# Patient Record
Sex: Female | Born: 1950 | Race: Black or African American | Hispanic: No | Smoking: Never smoker
Health system: Southern US, Community
[De-identification: ages and names within clinical notes are randomized; demographics above are authoritative.]

## PROBLEM LIST (undated history)

## (undated) DIAGNOSIS — D649 Anemia, unspecified: Secondary | ICD-10-CM

## (undated) DIAGNOSIS — H409 Unspecified glaucoma: Secondary | ICD-10-CM

## (undated) DIAGNOSIS — N189 Chronic kidney disease, unspecified: Secondary | ICD-10-CM

## (undated) DIAGNOSIS — E119 Type 2 diabetes mellitus without complications: Secondary | ICD-10-CM

## (undated) DIAGNOSIS — I10 Essential (primary) hypertension: Secondary | ICD-10-CM

## (undated) DIAGNOSIS — U071 COVID-19: Secondary | ICD-10-CM

## (undated) HISTORY — DX: Chronic kidney disease, unspecified: N18.9

## (undated) HISTORY — DX: Type 2 diabetes mellitus without complications: E11.9

## (undated) HISTORY — DX: Unspecified glaucoma: H40.9

## (undated) HISTORY — DX: Anemia, unspecified: D64.9

## (undated) HISTORY — PX: TUBAL LIGATION: SHX77

## (undated) HISTORY — DX: COVID-19: U07.1

## (undated) HISTORY — DX: Essential (primary) hypertension: I10

## (undated) HISTORY — PX: BIOPSY THYROID: PRO38

---

## 2004-06-29 ENCOUNTER — Ambulatory Visit: Payer: Self-pay

## 2005-09-26 ENCOUNTER — Ambulatory Visit: Payer: Self-pay

## 2006-10-14 ENCOUNTER — Ambulatory Visit: Payer: Self-pay

## 2007-12-17 ENCOUNTER — Ambulatory Visit: Payer: Self-pay

## 2009-01-06 ENCOUNTER — Ambulatory Visit: Payer: Self-pay

## 2010-01-12 ENCOUNTER — Ambulatory Visit: Payer: Self-pay

## 2011-09-20 ENCOUNTER — Ambulatory Visit: Payer: Self-pay

## 2011-09-30 ENCOUNTER — Ambulatory Visit: Payer: Self-pay | Admitting: Gastroenterology

## 2011-09-30 LAB — HM COLONOSCOPY: HM Colonoscopy: NORMAL

## 2012-12-23 ENCOUNTER — Ambulatory Visit: Payer: Self-pay

## 2013-09-10 ENCOUNTER — Ambulatory Visit: Payer: Self-pay | Admitting: Family Medicine

## 2013-11-09 LAB — BASIC METABOLIC PANEL
BUN: 21 mg/dL (ref 4–21)
Creatinine: 0.8 mg/dL (ref 0.5–1.1)
Glucose: 96 mg/dL
POTASSIUM: 4.4 mmol/L (ref 3.4–5.3)
SODIUM: 141 mmol/L (ref 137–147)

## 2013-11-09 LAB — HEPATIC FUNCTION PANEL
ALT: 8 U/L (ref 7–35)
AST: 15 U/L (ref 13–35)

## 2013-12-24 ENCOUNTER — Ambulatory Visit: Payer: Self-pay

## 2014-07-13 LAB — HM DIABETES EYE EXAM

## 2014-11-14 LAB — HEMOGLOBIN A1C: Hgb A1c MFr Bld: 6.5 % — AB (ref 4.0–6.0)

## 2014-11-22 DIAGNOSIS — I10 Essential (primary) hypertension: Secondary | ICD-10-CM | POA: Insufficient documentation

## 2014-11-22 DIAGNOSIS — E118 Type 2 diabetes mellitus with unspecified complications: Secondary | ICD-10-CM | POA: Insufficient documentation

## 2014-11-22 DIAGNOSIS — E119 Type 2 diabetes mellitus without complications: Secondary | ICD-10-CM | POA: Insufficient documentation

## 2015-01-11 DIAGNOSIS — M7030 Other bursitis of elbow, unspecified elbow: Secondary | ICD-10-CM | POA: Insufficient documentation

## 2015-01-11 DIAGNOSIS — K5900 Constipation, unspecified: Secondary | ICD-10-CM | POA: Insufficient documentation

## 2015-01-11 DIAGNOSIS — M545 Low back pain, unspecified: Secondary | ICD-10-CM | POA: Insufficient documentation

## 2015-01-11 DIAGNOSIS — R202 Paresthesia of skin: Secondary | ICD-10-CM | POA: Insufficient documentation

## 2015-01-11 DIAGNOSIS — R634 Abnormal weight loss: Secondary | ICD-10-CM | POA: Insufficient documentation

## 2015-01-11 DIAGNOSIS — M5430 Sciatica, unspecified side: Secondary | ICD-10-CM | POA: Insufficient documentation

## 2015-01-11 DIAGNOSIS — I340 Nonrheumatic mitral (valve) insufficiency: Secondary | ICD-10-CM | POA: Insufficient documentation

## 2015-01-11 DIAGNOSIS — R87619 Unspecified abnormal cytological findings in specimens from cervix uteri: Secondary | ICD-10-CM | POA: Insufficient documentation

## 2015-01-11 DIAGNOSIS — R011 Cardiac murmur, unspecified: Secondary | ICD-10-CM

## 2015-01-11 DIAGNOSIS — D649 Anemia, unspecified: Secondary | ICD-10-CM | POA: Insufficient documentation

## 2015-01-11 DIAGNOSIS — E559 Vitamin D deficiency, unspecified: Secondary | ICD-10-CM | POA: Insufficient documentation

## 2015-01-11 DIAGNOSIS — E669 Obesity, unspecified: Secondary | ICD-10-CM | POA: Insufficient documentation

## 2015-01-13 ENCOUNTER — Ambulatory Visit (INDEPENDENT_AMBULATORY_CARE_PROVIDER_SITE_OTHER): Payer: 59 | Admitting: Family Medicine

## 2015-01-13 ENCOUNTER — Encounter: Payer: Self-pay | Admitting: Family Medicine

## 2015-01-13 VITALS — BP 132/84 | HR 84 | Temp 98.2°F | Resp 16 | Wt 193.0 lb

## 2015-01-13 DIAGNOSIS — M722 Plantar fascial fibromatosis: Secondary | ICD-10-CM | POA: Diagnosis not present

## 2015-01-13 DIAGNOSIS — H6983 Other specified disorders of Eustachian tube, bilateral: Secondary | ICD-10-CM

## 2015-01-13 MED ORDER — FLUTICASONE PROPIONATE 50 MCG/ACT NA SUSP: 2.0000 | Freq: Every day | NASAL | Status: DC

## 2015-01-13 NOTE — Patient Instructions (Signed)
Plantar Fasciitis Plantar fasciitis is a common condition that causes foot pain. It is soreness (inflammation) of the band of tough fibrous tissue on the bottom of the foot that runs from the heel bone (calcaneus) to the ball of the foot. The cause of this soreness may be from excessive standing, poor fitting shoes, running on hard surfaces, being overweight, having an abnormal walk, or overuse (this is common in runners) of the painful foot or feet. It is also common in aerobic exercise dancers and ballet dancers. SYMPTOMS  Most people with plantar fasciitis complain of:  Severe pain in the morning on the bottom of their foot especially when taking the first steps out of bed. This pain recedes after a few minutes of walking.  Severe pain is experienced also during walking following a long period of inactivity.  Pain is worse when walking barefoot or up stairs DIAGNOSIS   Your caregiver will diagnose this condition by examining and feeling your foot.  Special tests such as X-rays of your foot, are usually not needed. PREVENTION   Consult a sports medicine professional before beginning a new exercise program.  Walking programs offer a good workout. With walking there is a lower chance of overuse injuries common to runners. There is less impact and less jarring of the joints.  Begin all new exercise programs slowly. If problems or pain develop, decrease the amount of time or distance until you are at a comfortable level.  Wear good shoes and replace them regularly.  Stretch your foot and the heel cords at the back of the ankle (Achilles tendon) both before and after exercise.  Run or exercise on even surfaces that are not hard. For example, asphalt is better than pavement.  Do not run barefoot on hard surfaces.  If using a treadmill, vary the incline.  Do not continue to workout if you have foot or joint problems. Seek professional help if they do not improve. HOME CARE INSTRUCTIONS     Avoid activities that cause you pain until you recover.  Use ice or cold packs on the problem or painful areas after working out. Use frozen water bottle.   Only take over-the-counter or prescription medicines for pain, discomfort, or fever as directed by your caregiver.  Soft shoe inserts or athletic shoes with air or gel sole cushions may be helpful.  If problems continue or become more severe, consult a sports medicine caregiver or your own health care provider. Cortisone is a potent anti-inflammatory medication that may be injected into the painful area. You can discuss this treatment with your caregiver. MAKE SURE YOU:   Understand these instructions.  Will watch your condition.  Will get help right away if you are not doing well or get worse. Document Released: 03/19/2001 Document Revised: 09/16/2011 Document Reviewed: 05/18/2008 [employer] Patient Information 2015 [place], [place]. This information is not intended to replace advice given to you by your health care provider. Make sure you discuss any questions you have with your health care provider.

## 2015-01-13 NOTE — Progress Notes (Signed)
Subjective:    Patient ID: Natasha Phillips, female    DOB: [birth year], 64 y.o.   MRN: [id number]  Otalgia  There is pain in the left ear. This is a recurrent problem. The current episode started more than 1 year ago. The problem occurs every few hours (Pt reports the pain comes and goes, but hurts everyday.  ). The problem has been gradually worsening. There has been no fever. Associated symptoms include headaches and hearing loss. Pertinent negatives include no coughing, ear discharge, rhinorrhea or sore throat.  Foot Injury  There was no injury mechanism (Pt reports having heel pain several years ago, but it did resolve until around June.  ). The pain is present in the right heel. The quality of the pain is described as aching. The pain has been fluctuating since onset. Exacerbated by: Worse in the mornings, and also when she sits for a period of time. She has tried nothing for the symptoms.    Patient Active Problem List   Diagnosis Date Noted  . Abnormal cervical Papanicolaou smear 01/11/2015  . Abnormal loss of weight 01/11/2015  . Absolute anemia 01/11/2015  . Cardiac murmur 01/11/2015  . CN (constipation) 01/11/2015  . Diabetes 01/11/2015  . Calcium blood increased 01/11/2015  . BP (high blood pressure) 01/11/2015  . LBP (low back pain) 01/11/2015  . Adiposity 01/11/2015  . Bursitis of elbow 01/11/2015  . Leg paresthesia 01/11/2015  . Neuralgia neuritis, sciatic nerve 01/11/2015  . Avitaminosis D 01/11/2015  . Essential (primary) hypertension 11/22/2014   Family History  Problem Relation Age of Onset  . Diabetes Mother   . Heart failure Mother   . Diabetes Sister   . Healthy Sister   . Healthy Sister   . Healthy Sister   . Diabetes Brother   . Diabetes Sister   . Heart failure Father   . Brain cancer Brother   . Throat cancer Brother   . Glaucoma Other     Runs on Paternal Side   History   Social History  . Marital Status: Widowed    Spouse Name: N/A  .  Number of Children: 2  . Years of Education: College   Occupational History  . Retired    Social History Main Topics  . Smoking status: Never Smoker   . Smokeless tobacco: Never Used  . Alcohol Use: No  . Drug Use: No  . Sexual Activity: Not on file   Other Topics Concern  . Not on file   Social History Narrative   Past Surgical History  Procedure Laterality Date  . Tubal ligation    . Cesarean section      1981 1988   Allergies  Allergen Reactions  . Sulfa Antibiotics    Previous Medications   FOSINOPRIL (MONOPRIL) 40 MG TABLET    Take by mouth.   GLIPIZIDE (GLUCOTROL XL) 5 MG 24 HR TABLET    Take by mouth.   HYDROCHLOROTHIAZIDE (HYDRODIURIL) 25 MG TABLET    Take by mouth.   MELOXICAM (MOBIC) 15 MG TABLET    Take by mouth.   METFORMIN (GLUCOPHAGE) 1000 MG TABLET    Take by mouth.   MULTIPLE VITAMINS-CALCIUM PO    Take by mouth.   PIOGLITAZONE (ACTOS) 45 MG TABLET    Take by mouth.   SAXAGLIPTIN HCL (ONGLYZA) 5 MG TABS TABLET    Take by mouth.   BP 132/84 mmHg  Pulse 84  Temp(Src) 98.2 F (36.8 C) (Oral)  Resp  16  Wt 193 lb (87.544 kg)    Review of Systems  Constitutional: Negative for fever, chills, diaphoresis, activity change, appetite change, fatigue and unexpected weight change.  HENT: Positive for ear pain and hearing loss. Negative for congestion, ear discharge, facial swelling, mouth sores, nosebleeds, postnasal drip, rhinorrhea, sinus pressure, sneezing, sore throat, tinnitus, trouble swallowing and voice change.   Respiratory: Negative for apnea, cough, choking, chest tightness, shortness of breath, wheezing and stridor.   Cardiovascular: Negative for chest pain, palpitations and leg swelling.  Neurological: Positive for headaches. Negative for dizziness and light-headedness.       Objective:   Physical Exam  Constitutional: She is oriented to person, place, and time. She appears well-developed and well-nourished.  HENT:  Head: Normocephalic and  atraumatic.  Right Ear: Hearing and tympanic membrane normal.  Left Ear: Hearing and tympanic membrane normal.  Nose: Mucosal edema present. No rhinorrhea or sinus tenderness.  Mouth/Throat: Uvula is midline, oropharynx is clear and moist and mucous membranes are normal.  Eyes: Conjunctivae and EOM are normal. Pupils are equal, round, and reactive to light.  Neck: Normal range of motion. Neck supple.  Cardiovascular: Normal rate and regular rhythm.   Murmur heard. Pulmonary/Chest: Effort normal and breath sounds normal.  Musculoskeletal: She exhibits no edema.       Feet:  Neurological: She is alert and oriented to person, place, and time.  Psychiatric: She has a normal mood and affect. Her behavior is normal. Judgment and thought content normal.  BP 132/84 mmHg  Pulse 84  Temp(Src) 98.2 F (36.8 C) (Oral)  Resp 16  Wt 193 lb (87.544 kg)      Assessment & Plan:  1. Plantar fasciitis of right foot Instructions given. Going to try icing with water bottle and stretching. OV if does not improve.   2. Eustachian tube dysfunction, bilateral New problem Worsening. Will try nasal spray.  Follow up if does not resolve.  - fluticasone (FLONASE) 50 MCG/ACT nasal spray; Place 2 sprays into both nostrils daily.  Dispense: 16 g; Refill: 6  Lorie PhenixNancy Chattie Greeson, MD

## 2015-02-07 ENCOUNTER — Telehealth: Payer: Self-pay | Admitting: Family Medicine

## 2015-02-07 NOTE — Telephone Encounter (Signed)
Left message to call back  

## 2015-02-07 NOTE — Telephone Encounter (Signed)
Should be ok. Thanks!

## 2015-02-07 NOTE — Telephone Encounter (Signed)
Pt is asking if it would be ok if she takes Biotin   Over the counter?  [phone]

## 2015-02-08 NOTE — Telephone Encounter (Signed)
Advised patient as below.  

## 2015-03-14 ENCOUNTER — Encounter: Payer: Self-pay | Admitting: Family Medicine

## 2015-03-14 ENCOUNTER — Ambulatory Visit (INDEPENDENT_AMBULATORY_CARE_PROVIDER_SITE_OTHER): Payer: 59 | Admitting: Family Medicine

## 2015-03-14 VITALS — BP 136/84 | HR 84 | Temp 98.5°F | Resp 16 | Ht 61.5 in | Wt 191.0 lb

## 2015-03-14 DIAGNOSIS — I10 Essential (primary) hypertension: Secondary | ICD-10-CM | POA: Diagnosis not present

## 2015-03-14 DIAGNOSIS — E119 Type 2 diabetes mellitus without complications: Secondary | ICD-10-CM | POA: Diagnosis not present

## 2015-03-14 LAB — POCT GLYCOSYLATED HEMOGLOBIN (HGB A1C): Hemoglobin A1C: 5.9

## 2015-03-14 NOTE — Progress Notes (Addendum)
Patient ID: Natasha Phillips, female   DOB: January 08, 1951, 64 y.o.   MRN: [id number]        Patient: Natasha Phillips Female    DOB: 1951/02/24   64 y.o.   MRN: [id number] Visit Date: 03/14/2015  Today's Provider: Lorie Phenix, MD   Chief Complaint  Patient presents with  . Diabetes  . Hypertension   Subjective:    HPI   Diabetes Mellitus Type II, Follow-up:   Lab Results  Component Value Date   HGBA1C 6.5* 11/14/2014   Results for orders placed or performed in visit on 03/14/15  POCT glycosylated hemoglobin (Hb A1C)  Result Value Ref Range   Hemoglobin A1C 5.9     Last seen for diabetes 4 months ago.  Management changes included no changes. She reports excellent compliance with treatment. She is not having side effects.  Current symptoms include none and have been stable. Home blood sugar records: fasting range: 130-135  Episodes of hypoglycemia? No lows. Knows what a low is.    Current Insulin Regimen: none Most Recent Eye Exam: 07/[facility] Weight trend: stable Prior visit with dietician: no Current diet: in general, a "healthy" diet   Current exercise: walking at least 5 times a week.   Pertinent Labs:    Component Value Date/Time   CREATININE 0.8 11/09/2013    Wt Readings from Last 3 Encounters:  03/14/15 191 lb (86.637 kg)  01/13/15 193 lb (87.544 kg)  11/14/14 193 lb (87.544 kg)     Hypertension, follow-up:  BP Readings from Last 3 Encounters:  03/14/15 136/84  01/13/15 132/84  11/14/14 122/88    She was last seen for hypertension 4 months ago.  BP at that visit was 132/84. Management changes since that visit include none. She reports excellent compliance with treatment. She is not having side effects.  She is exercising. She is adherent to low salt diet.   Outside blood pressures are not being checked. She is experiencing none.  Patient denies chest pain.   Cardiovascular risk factors include diabetes mellitus.       Allergies  Allergen Reactions  . Sulfa Antibiotics    Previous Medications   FLUTICASONE (FLONASE) 50 MCG/ACT NASAL SPRAY    Place 2 sprays into both nostrils daily.   FOSINOPRIL (MONOPRIL) 40 MG TABLET    Take 40 mg by mouth daily.    GLIPIZIDE (GLUCOTROL XL) 5 MG 24 HR TABLET    Take 5 mg by mouth daily with breakfast.    HYDROCHLOROTHIAZIDE (HYDRODIURIL) 25 MG TABLET    Take 25 mg by mouth daily.    MELOXICAM (MOBIC) 15 MG TABLET    Take 15 mg by mouth daily.    METFORMIN (GLUCOPHAGE) 1000 MG TABLET    Take 1,000 mg by mouth daily with breakfast.    MULTIPLE VITAMINS-CALCIUM PO    Take 1 tablet by mouth daily.    PIOGLITAZONE (ACTOS) 45 MG TABLET    Take 45 mg by mouth daily.    SAXAGLIPTIN HCL (ONGLYZA) 5 MG TABS TABLET    Take 5 mg by mouth daily.     Review of Systems  Constitutional: Negative.   Eyes: Negative.   Respiratory: Negative.   Cardiovascular: Negative.   Gastrointestinal: Negative.   Endocrine: Negative.   Skin: Negative.   Neurological: Negative.   Psychiatric/Behavioral: Negative.     Social History  Substance Use Topics  . Smoking status: Never Smoker   . Smokeless tobacco: Never Used  .  Alcohol Use: No   Objective:   BP 136/84 mmHg  Pulse 84  Temp(Src) 98.5 F (36.9 C) (Oral)  Resp 16  Ht 5' 1.5" (1.562 m)  Wt 191 lb (86.637 kg)  BMI 35.51 kg/m2  SpO2 100%  Physical Exam  Constitutional: She is oriented to person, place, and time. She appears well-developed and well-nourished.  Cardiovascular: Normal rate and regular rhythm.   Pulmonary/Chest: Effort normal and breath sounds normal.  Neurological: She is alert and oriented to person, place, and time.  Psychiatric: She has a normal mood and affect. Her behavior is normal. Judgment and thought content normal.   Results for orders placed or performed in visit on 03/14/15  POCT glycosylated hemoglobin (Hb A1C)  Result Value Ref Range   Hemoglobin A1C 5.9   HM DIABETES EYE EXAM  Result  Value Ref Range   HM Diabetic Eye Exam No Retinopathy No Retinopathy   Diabetic Foot Exam - Simple   Simple Foot Form  Diabetic Foot exam was performed with the following findings:  Yes 03/14/2015 12:51 PM  Visual Inspection  No deformities, no ulcerations, no other skin breakdown bilaterally:  Yes  Sensation Testing  Intact to touch and monofilament testing bilaterally:  Yes  Pulse Check  Posterior Tibialis and Dorsalis pulse intact bilaterally:  Yes  Comments          Assessment & Plan:     1. Type 2 diabetes mellitus without complication Improved. Doing very well.  Will stop Glucotrol for now and recheck in 3 months.    2. Essential (primary) hypertension Stable. Continue current medication and plan of care.      Patient was seen and examined by Leo Grosser, MD, and note scribed, in part,  by Rondel Baton, CMA.   I have reviewed the document for accuracy and completeness and I agree with above. - Leo Grosser, MD   Lorie Phenix, MD  [facility]

## 2015-05-17 ENCOUNTER — Other Ambulatory Visit: Payer: Self-pay | Admitting: Family Medicine

## 2015-05-17 DIAGNOSIS — E119 Type 2 diabetes mellitus without complications: Secondary | ICD-10-CM

## 2015-06-22 ENCOUNTER — Encounter: Payer: Self-pay | Admitting: Family Medicine

## 2015-06-22 ENCOUNTER — Ambulatory Visit (INDEPENDENT_AMBULATORY_CARE_PROVIDER_SITE_OTHER): Payer: 59 | Admitting: Family Medicine

## 2015-06-22 VITALS — BP 136/72 | HR 84 | Temp 98.2°F | Resp 16 | Ht 60.5 in | Wt 187.0 lb

## 2015-06-22 DIAGNOSIS — I10 Essential (primary) hypertension: Secondary | ICD-10-CM | POA: Diagnosis not present

## 2015-06-22 DIAGNOSIS — Z126 Encounter for screening for malignant neoplasm of bladder: Secondary | ICD-10-CM

## 2015-06-22 DIAGNOSIS — E119 Type 2 diabetes mellitus without complications: Secondary | ICD-10-CM | POA: Diagnosis not present

## 2015-06-22 DIAGNOSIS — Z0000 Encounter for general adult medical examination without abnormal findings: Secondary | ICD-10-CM

## 2015-06-22 DIAGNOSIS — Z136 Encounter for screening for cardiovascular disorders: Secondary | ICD-10-CM | POA: Diagnosis not present

## 2015-06-22 DIAGNOSIS — Z13220 Encounter for screening for lipoid disorders: Secondary | ICD-10-CM

## 2015-06-22 LAB — POCT GLYCOSYLATED HEMOGLOBIN (HGB A1C)
Est. average glucose Bld gHb Est-mCnc: 137
Hemoglobin A1C: 6.4

## 2015-06-22 LAB — POCT UA - MICROALBUMIN: Microalbumin Ur, POC: NEGATIVE mg/L

## 2015-06-22 LAB — POCT URINALYSIS DIPSTICK
BILIRUBIN UA: NEGATIVE
Blood, UA: NEGATIVE
Glucose, UA: NEGATIVE
Ketones, UA: NEGATIVE
Leukocytes, UA: NEGATIVE
NITRITE UA: NEGATIVE
PH UA: 6
Protein, UA: NEGATIVE
Spec Grav, UA: 1.02
UROBILINOGEN UA: 0.2

## 2015-06-22 MED ORDER — MELOXICAM 15 MG PO TABS: 15.0000 mg | ORAL_TABLET | Freq: Every day | ORAL | Status: DC

## 2015-06-22 MED ORDER — METFORMIN HCL 1000 MG PO TABS: 1000.0000 mg | ORAL_TABLET | Freq: Every day | ORAL | Status: DC

## 2015-06-22 MED ORDER — HYDROCHLOROTHIAZIDE 25 MG PO TABS: 25.0000 mg | ORAL_TABLET | Freq: Every day | ORAL | Status: DC

## 2015-06-22 MED ORDER — PIOGLITAZONE HCL 45 MG PO TABS: 45.0000 mg | ORAL_TABLET | Freq: Every day | ORAL | Status: DC

## 2015-06-22 MED ORDER — FOSINOPRIL SODIUM 40 MG PO TABS: 40.0000 mg | ORAL_TABLET | Freq: Every day | ORAL | Status: DC

## 2015-06-22 MED ORDER — SAXAGLIPTIN HCL 5 MG PO TABS: 5.0000 mg | ORAL_TABLET | Freq: Every day | ORAL | Status: DC

## 2015-06-22 NOTE — Progress Notes (Signed)
Patient: Natasha Phillips, Female    DOB: 1950/10/20, 64 y.o.   MRN: [id number] Visit Date: 06/22/2015  Today's Provider: Lorie Phenix, MD   Chief Complaint  Patient presents with  . Annual Exam   Subjective:    Annual physical exam Natasha Phillips is a 64 y.o. female who presents today for health maintenance and complete physical. She feels well. She reports exercising by walking on treadmill 3-5 times per week. She reports she is sleeping well.  Pt sees GYN for Pap and Mammogram. Colonoscopy 2013 and was WNL. ----------------------------------------------------------------- Diabetes Mellitus: Patient presents for follow up of diabetes. Symptoms: none. Symptoms have been well-controlled. Patient denies foot ulcerations, hypoglycemia , increase appetite, nausea, paresthesia of the feet, polydipsia, polyuria, visual disturbances and vomitting.  Evaluation to date has been included: hemoglobin A1C and microalbuminuria.  Home sugars: patient does not check sugars. Treatment to date: Medications.   Review of Systems  Constitutional: Negative.   HENT: Positive for ear pain.   Eyes: Negative.   Respiratory: Negative.   Cardiovascular: Negative.   Gastrointestinal: Negative.   Endocrine: Negative.   Genitourinary: Negative.   Musculoskeletal: Positive for arthralgias.  Skin: Negative.   Allergic/Immunologic: Negative.   Neurological: Negative.   Hematological: Negative.   Psychiatric/Behavioral: Negative.   All other systems reviewed and are negative.   Social History      She  reports that she has never smoked. She has never used smokeless tobacco. She reports that she does not drink alcohol or use illicit drugs.       Social History   Social History  . Marital Status: Widowed    Spouse Name: N/A  . Number of Children: 2  . Years of Education: College   Occupational History  . Retired    Social History Main Topics  . Smoking status: Never Smoker   .  Smokeless tobacco: Never Used  . Alcohol Use: No  . Drug Use: No  . Sexual Activity: Not Asked   Other Topics Concern  . None   Social History Narrative    Patient Active Problem List   Diagnosis Date Noted  . Abnormal cervical Papanicolaou smear 01/11/2015  . Abnormal loss of weight 01/11/2015  . Absolute anemia 01/11/2015  . Cardiac murmur 01/11/2015  . CN (constipation) 01/11/2015  . Diabetes (HCC) 01/11/2015  . Calcium blood increased 01/11/2015  . LBP (low back pain) 01/11/2015  . Adiposity 01/11/2015  . Bursitis of elbow 01/11/2015  . Leg paresthesia 01/11/2015  . Neuralgia neuritis, sciatic nerve 01/11/2015  . Avitaminosis D 01/11/2015  . Essential (primary) hypertension 11/22/2014    Past Surgical History  Procedure Laterality Date  . Tubal ligation    . Cesarean section      1981 1988    Family History        Family Status  Relation Status Death Age  . Mother Deceased 28    chf  . Sister Alive   . Sister Alive   . Sister Alive   . Sister Alive   . Brother Alive   . Sister Alive   . Father Deceased 54    chf  . Brother Deceased 41    brain cancer  . Brother Deceased     throat cancer        Her family history includes Brain cancer in her brother; Diabetes in her brother, mother, sister, and sister; Glaucoma in her other; Healthy in her sister, sister, and sister;  Heart failure in her father and mother; Throat cancer in her brother.    Allergies  Allergen Reactions  . Sulfa Antibiotics     Previous Medications   FLUTICASONE (FLONASE) 50 MCG/ACT NASAL SPRAY    Place 2 sprays into both nostrils daily.   FOSINOPRIL (MONOPRIL) 40 MG TABLET    Take 40 mg by mouth daily.    HYDROCHLOROTHIAZIDE (HYDRODIURIL) 25 MG TABLET    Take 25 mg by mouth daily.    MELOXICAM (MOBIC) 15 MG TABLET    Take 15 mg by mouth daily.    METFORMIN (GLUCOPHAGE) 1000 MG TABLET    Take 1,000 mg by mouth daily with breakfast.    MULTIPLE VITAMINS-CALCIUM PO    Take 1 tablet  by mouth daily.    ONGLYZA 5 MG TABS TABLET    TAKE 1 TABLET BY MOUTH EVERY DAY   PIOGLITAZONE (ACTOS) 45 MG TABLET    Take 45 mg by mouth daily.     Patient Care Team: Lorie PhenixNancy Jilian West, MD as PCP - General (Family Medicine)     Objective:   Vitals: BP 136/72 mmHg  Pulse 84  Temp(Src) 98.2 F (36.8 C) (Oral)  Resp 16  Ht 5' 0.5" (1.537 m)  Wt 187 lb (84.823 kg)  BMI 35.91 kg/m2   Physical Exam  Constitutional: She is oriented to person, place, and time. She appears well-developed and well-nourished. No distress.  HENT:  Head: Normocephalic and atraumatic.  Right Ear: External ear normal.  Left Ear: External ear normal.  Nose: Nose normal.  Mouth/Throat: Oropharynx is clear and moist. No oropharyngeal exudate.  Eyes: Conjunctivae and EOM are normal. Pupils are equal, round, and reactive to light. Right eye exhibits no discharge. Left eye exhibits no discharge. No scleral icterus.  Neck: Normal range of motion. Neck supple. No JVD present. No tracheal deviation present. No thyromegaly present.  Cardiovascular: Normal rate, regular rhythm, normal heart sounds and intact distal pulses.  Exam reveals no gallop and no friction rub.   No murmur heard. Pulmonary/Chest: Effort normal and breath sounds normal. No stridor. No respiratory distress. She has no wheezes. She has no rales. She exhibits no tenderness.  Abdominal: Soft. Bowel sounds are normal. She exhibits no distension and no mass. There is no tenderness. There is no rebound and no guarding.  Musculoskeletal: Normal range of motion. She exhibits no edema or tenderness.  Lymphadenopathy:    She has no cervical adenopathy.  Neurological: She is alert and oriented to person, place, and time. She has normal reflexes. She displays normal reflexes. No cranial nerve deficit. She exhibits normal muscle tone. Coordination normal.  Skin: Skin is warm and dry. No rash noted. She is not diaphoretic. No erythema. No pallor.  Psychiatric: She  has a normal mood and affect. Her behavior is normal. Judgment and thought content normal.     Depression Screen PHQ 2/9 Scores 06/22/2015  PHQ - 2 Score 0      Assessment & Plan:     Routine Health Maintenance and Physical Exam  Exercise Activities and Dietary recommendations Goals    . HEMOGLOBIN A1C < 7.0    . Peak Blood Glucose < 180       Immunization History  Administered Date(s) Administered  . Hepatitis B 09/20/2011, 11/01/2011, 04/03/2012  . Pneumococcal Polysaccharide-23 05/19/2013  . Tdap 02/07/2012     Discussed health benefits of physical activity, and encouraged her to engage in regular exercise appropriate for her age and condition.    --------------------------------------------------------------------  1. Annual physical exam Stable. As above.   Results for orders placed or performed in visit on 06/22/15  POCT urinalysis dipstick  Result Value Ref Range   Color, UA amber    Clarity, UA clear    Glucose, UA Negative    Bilirubin, UA Negative    Ketones, UA Negative    Spec Grav, UA 1.020    Blood, UA Negative    pH, UA 6.0    Protein, UA Negative    Urobilinogen, UA 0.2    Nitrite, UA Negative    Leukocytes, UA Negative Negative  POCT UA - Microalbumin  Result Value Ref Range   Microalbumin Ur, POC Negative mg/L  POCT glycosylated hemoglobin (Hb A1C)  Result Value Ref Range   Hemoglobin A1C 6.4    Est. average glucose Bld gHb Est-mCnc 137      2. Bladder cancer screening UA negative - POCT urinalysis dipstick  3. Type 2 diabetes mellitus without complication, without long-term current use of insulin (HCC) Stable. A1C 6.4 today. Continue current medications and plan of care. - meloxicam (MOBIC) 15 MG tablet; Take 1 tablet (15 mg total) by mouth daily.  Dispense: 90 tablet; Refill: 3 - metFORMIN (GLUCOPHAGE) 1000 MG tablet; Take 1 tablet (1,000 mg total) by mouth daily with breakfast.  Dispense: 90 tablet; Refill: 3 - saxagliptin HCl  (ONGLYZA) 5 MG TABS tablet; Take 1 tablet (5 mg total) by mouth daily.  Dispense: 90 tablet; Refill: 3 - pioglitazone (ACTOS) 45 MG tablet; Take 1 tablet (45 mg total) by mouth daily.  Dispense: 90 tablet; Refill: 3 - POCT UA - Microalbumin - POCT glycosylated hemoglobin (Hb A1C)  4. Essential (primary) hypertension Stable. Refill medications as below. FU pending lab results. - Comprehensive metabolic panel - CBC with Differential/Platelet - fosinopril (MONOPRIL) 40 MG tablet; Take 1 tablet (40 mg total) by mouth daily.  Dispense: 90 tablet; Refill: 3 - hydrochlorothiazide (HYDRODIURIL) 25 MG tablet; Take 1 tablet (25 mg total) by mouth daily.  Dispense: 90 tablet; Refill: 3  5. Encounter for lipid screening for cardiovascular disease FU pending lab results. - Lipid panel  Patient seen and examined by Leo Grosser, MD, and note scribed by Allene Dillon, CMA.  I have reviewed the document for accuracy and completeness and I agree with above. Leo Grosser, MD  Lorie Phenix, MD

## 2015-07-21 LAB — CBC WITH DIFFERENTIAL/PLATELET
BASOS: 0 %
Basophils Absolute: 0 10*3/uL (ref 0.0–0.2)
EOS (ABSOLUTE): 0.2 10*3/uL (ref 0.0–0.4)
EOS: 3 %
HEMATOCRIT: 33.7 % — AB (ref 34.0–46.6)
Hemoglobin: 11.1 g/dL (ref 11.1–15.9)
IMMATURE GRANULOCYTES: 0 %
Immature Grans (Abs): 0 10*3/uL (ref 0.0–0.1)
LYMPHS: 34 %
Lymphocytes Absolute: 2.4 10*3/uL (ref 0.7–3.1)
MCH: 29.2 pg (ref 26.6–33.0)
MCHC: 32.9 g/dL (ref 31.5–35.7)
MCV: 89 fL (ref 79–97)
Monocytes Absolute: 0.3 10*3/uL (ref 0.1–0.9)
Monocytes: 4 %
NEUTROS PCT: 59 %
Neutrophils Absolute: 4.1 10*3/uL (ref 1.4–7.0)
PLATELETS: 303 10*3/uL (ref 150–379)
RBC: 3.8 x10E6/uL (ref 3.77–5.28)
RDW: 13.8 % (ref 12.3–15.4)
WBC: 7 10*3/uL (ref 3.4–10.8)

## 2015-07-21 LAB — LIPID PANEL
CHOL/HDL RATIO: 2.4 ratio (ref 0.0–4.4)
CHOLESTEROL TOTAL: 118 mg/dL (ref 100–199)
HDL: 50 mg/dL (ref >39)
LDL CALC: 59 mg/dL (ref 0–99)
Triglycerides: 44 mg/dL (ref 0–149)
VLDL Cholesterol Cal: 9 mg/dL (ref 5–40)

## 2015-07-21 LAB — COMPREHENSIVE METABOLIC PANEL
ALK PHOS: 89 IU/L (ref 39–117)
ALT: 11 IU/L (ref 0–32)
AST: 16 IU/L (ref 0–40)
Albumin/Globulin Ratio: 1.4 (ref 1.1–2.5)
Albumin: 4.2 g/dL (ref 3.6–4.8)
BUN/Creatinine Ratio: 26 (ref 11–26)
BUN: 21 mg/dL (ref 8–27)
Bilirubin Total: 0.2 mg/dL (ref 0.0–1.2)
CO2: 26 mmol/L (ref 18–29)
CREATININE: 0.82 mg/dL (ref 0.57–1.00)
Calcium: 9.8 mg/dL (ref 8.7–10.3)
Chloride: 98 mmol/L (ref 96–106)
GFR calc Af Amer: 89 mL/min/{1.73_m2} (ref >59)
GFR calc non Af Amer: 77 mL/min/{1.73_m2} (ref >59)
Globulin, Total: 3.1 g/dL (ref 1.5–4.5)
Glucose: 128 mg/dL — ABNORMAL HIGH (ref 65–99)
Potassium: 4.2 mmol/L (ref 3.5–5.2)
Sodium: 142 mmol/L (ref 134–144)
Total Protein: 7.3 g/dL (ref 6.0–8.5)

## 2015-08-25 ENCOUNTER — Telehealth: Payer: Self-pay | Admitting: Family Medicine

## 2015-08-25 DIAGNOSIS — G8929 Other chronic pain: Secondary | ICD-10-CM

## 2015-08-25 DIAGNOSIS — H9209 Otalgia, unspecified ear: Principal | ICD-10-CM

## 2015-08-25 NOTE — Telephone Encounter (Signed)
Ok to refer patient? Please advise. Thanks!  

## 2015-08-25 NOTE — Telephone Encounter (Signed)
OK to refer. Thanks.

## 2015-08-25 NOTE — Telephone Encounter (Signed)
Pt wants you to refer her to an ENT for chronic ear pain.    Her call back is [phone]  She has [employer].  Thanks Barth Kirks

## 2015-08-30 ENCOUNTER — Ambulatory Visit (INDEPENDENT_AMBULATORY_CARE_PROVIDER_SITE_OTHER): Payer: 59 | Admitting: Family Medicine

## 2015-08-30 ENCOUNTER — Encounter: Payer: Self-pay | Admitting: Family Medicine

## 2015-08-30 VITALS — BP 108/72 | HR 100 | Temp 98.2°F | Resp 16 | Wt 191.0 lb

## 2015-08-30 DIAGNOSIS — R51 Headache: Secondary | ICD-10-CM | POA: Diagnosis not present

## 2015-08-30 DIAGNOSIS — H6983 Other specified disorders of Eustachian tube, bilateral: Secondary | ICD-10-CM | POA: Diagnosis not present

## 2015-08-30 DIAGNOSIS — H9202 Otalgia, left ear: Secondary | ICD-10-CM | POA: Diagnosis not present

## 2015-08-30 DIAGNOSIS — R519 Headache, unspecified: Secondary | ICD-10-CM

## 2015-08-30 NOTE — Progress Notes (Signed)
   Subjective:    Patient ID: Natasha Phillips, female    DOB: [birth year], 65 y.o.   MRN: [id number]  HPI Recurrent Ear Pain: Patient presents with left ear pain. Symptoms began 2 weeks ago and are gradually worsening since that time. Patient denies fever and productive cough. Patient reports that she has been using Flonase 2 sprays, reports she has used it about 9 days in the last 2 weeks. Patient was seen in 01/13/2015 and was started on Flonase pt reports that spray helped in the first few weeks, but pain has not fully resolved.    Review of Systems  Constitutional: Negative.   HENT: Positive for congestion and ear pain. Negative for postnasal drip, rhinorrhea, sinus pressure, sneezing, sore throat and tinnitus.   Respiratory: Negative for cough, shortness of breath and wheezing.       Blood pressure 108/72, pulse 100, temperature 98.2 F (36.8 C), resp. rate 16, weight 191 lb (86.637 kg), SpO2 98 %. Objective:   Physical Exam  Constitutional: She appears well-developed and well-nourished.  HENT:  Head: Normocephalic and atraumatic.  Right Ear: External ear normal.  Left Ear: External ear normal.  Nose: Nose normal.  Mouth/Throat: Oropharynx is clear and moist.  turbinates swollen      Assessment & Plan:  1. Eustachian tube dysfunction, bilateral Still with ear pain. Will refer.   - Ambulatory referral to ENT  2. Ear pain, left Suspect may be TMJ. Stop chewing gum and follow up with ENT.   3. Left temporal headache No longer with pain today. But will check sed rate to be on the safe side.  - Sedimentation rate   Patient was seen and examined by Leo Grosser, MD, and note scribed by Rondel Baton, CMA.   I have reviewed the document for accuracy and completeness and I agree with above. Leo Grosser, MD   Lorie Phenix, MD

## 2015-08-31 ENCOUNTER — Telehealth: Payer: Self-pay

## 2015-08-31 LAB — SEDIMENTATION RATE: SED RATE: 11 mm/h (ref 0–40)

## 2015-08-31 NOTE — Telephone Encounter (Signed)
Pt advised.   Thanks,   -Laura  

## 2015-08-31 NOTE — Telephone Encounter (Signed)
-----   Message from Lorie Phenix, MD sent at 08/31/2015  7:00 AM EST ----- Normal Sed rate. Please notify patient. Thanks.

## 2015-09-15 ENCOUNTER — Other Ambulatory Visit: Payer: Self-pay | Admitting: Otolaryngology

## 2015-09-15 DIAGNOSIS — E041 Nontoxic single thyroid nodule: Secondary | ICD-10-CM

## 2015-09-19 ENCOUNTER — Telehealth: Payer: Self-pay | Admitting: Family Medicine

## 2015-09-19 ENCOUNTER — Ambulatory Visit
Admission: RE | Admit: 2015-09-19 | Discharge: 2015-09-19 | Disposition: A | Discharge status: Home / Self Care | Payer: 59 | Source: Ambulatory Visit | Attending: Otolaryngology | Admitting: Otolaryngology

## 2015-09-19 DIAGNOSIS — E049 Nontoxic goiter, unspecified: Secondary | ICD-10-CM

## 2015-09-19 DIAGNOSIS — E041 Nontoxic single thyroid nodule: Secondary | ICD-10-CM

## 2015-09-19 NOTE — Telephone Encounter (Signed)
Pt called saying she had a physical the first of the year and wants to know if Dr. Elease HashimotoMaloney check her thyroid.    She went to ENT recently and they told her she had some swollen nodules in her neck and they want to biopsy one of them.  Her call back is  [phone].  Thanks Barth Kirkseri

## 2015-09-20 NOTE — Telephone Encounter (Signed)
Did not check TSH, not part of routine checked, was checked the year before. Can definitely order test to check. Would do TSH with panel.   Thanks.

## 2015-09-21 NOTE — Telephone Encounter (Signed)
Ordered panel and advised pt that it is ready for PU. Allene DillonEmily Drozdowski, CMA

## 2015-09-22 LAB — THYROID PANEL WITH TSH
Free Thyroxine Index: 2.2 (ref 1.2–4.9)
T3 UPTAKE RATIO: 26 % (ref 24–39)
T4 TOTAL: 8.4 ug/dL (ref 4.5–12.0)
TSH: 1.08 u[IU]/mL (ref 0.450–4.500)

## 2015-09-22 NOTE — Telephone Encounter (Signed)
Patient advised as below.  

## 2015-09-22 NOTE — Telephone Encounter (Signed)
-----   Message from Lorie PhenixNancy Maloney, MD sent at 09/22/2015 12:06 PM EDT ----- Normal thyroid. Please notify patient. Thanks.

## 2015-09-27 ENCOUNTER — Other Ambulatory Visit: Payer: Self-pay | Admitting: Otolaryngology

## 2015-09-27 DIAGNOSIS — E041 Nontoxic single thyroid nodule: Secondary | ICD-10-CM

## 2015-10-04 ENCOUNTER — Other Ambulatory Visit: Payer: Self-pay | Admitting: Otolaryngology

## 2015-10-04 ENCOUNTER — Ambulatory Visit
Admission: RE | Admit: 2015-10-04 | Discharge: 2015-10-04 | Disposition: A | Discharge status: Home / Self Care | Payer: 59 | Source: Ambulatory Visit | Attending: Otolaryngology | Admitting: Otolaryngology

## 2015-10-04 DIAGNOSIS — E041 Nontoxic single thyroid nodule: Secondary | ICD-10-CM | POA: Diagnosis present

## 2015-10-04 NOTE — Procedures (Signed)
US thyroid biopsy of right and isthmic nodules  Complications:  None  Blood Loss: none  See dictation in canopy pacs

## 2015-10-06 LAB — CYTOLOGY - NON PAP

## 2015-10-13 ENCOUNTER — Ambulatory Visit (INDEPENDENT_AMBULATORY_CARE_PROVIDER_SITE_OTHER): Payer: 59 | Admitting: Family Medicine

## 2015-10-13 ENCOUNTER — Telehealth: Payer: Self-pay | Admitting: Family Medicine

## 2015-10-13 ENCOUNTER — Encounter: Payer: Self-pay | Admitting: Family Medicine

## 2015-10-13 VITALS — BP 118/74 | HR 92 | Temp 98.2°F | Resp 16 | Wt 187.0 lb

## 2015-10-13 DIAGNOSIS — I10 Essential (primary) hypertension: Secondary | ICD-10-CM

## 2015-10-13 DIAGNOSIS — E119 Type 2 diabetes mellitus without complications: Secondary | ICD-10-CM

## 2015-10-13 DIAGNOSIS — E041 Nontoxic single thyroid nodule: Secondary | ICD-10-CM

## 2015-10-13 LAB — POCT GLYCOSYLATED HEMOGLOBIN (HGB A1C)
ESTIMATED AVERAGE GLUCOSE: 140
Hemoglobin A1C: 6.5

## 2015-10-13 NOTE — Telephone Encounter (Signed)
Is there a way we can do this? Any thoughts? Thanks.

## 2015-10-13 NOTE — Telephone Encounter (Signed)
Pt wanted to see if Dr. Elease HashimotoMaloney could send orders to her dentist for a night guard b/c she grinds her teeth at night during sleep. Pt stated she would like us to e-mail the order to [email or web address] b/c she was advised they don't like to receive orders by fax. Please advise. Thanks TNP

## 2015-10-13 NOTE — Progress Notes (Signed)
Subjective:    Patient ID: Natasha Phillips, female    DOB: 1951/07/05, 65 y.o.   MRN: [id number]  Diabetes She presents for her follow-up (Last A1C was 06/22/2015 and was 6.4%) diabetic visit. She has type 2 diabetes mellitus. There are no hypoglycemic associated symptoms. Pertinent negatives for diabetes include no blurred vision, no chest pain, no fatigue, no foot paresthesias, no foot ulcerations, no polydipsia, no polyphagia, no polyuria, no visual change and no weakness. Symptoms are stable. Risk factors for coronary artery disease include diabetes mellitus, hypertension, post-menopausal and family history. Current diabetic treatment includes oral agent (triple therapy) (Metformin 1000 mg BID, Actos 45 mg, Onglyza 5 mg). She is compliant with treatment all of the time. She is following a generally healthy diet. Exercise: not lately due to thyroid biopsy. Her home blood glucose trend is fluctuating minimally. An ACE inhibitor/angiotensin II receptor blocker is being taken. Eye exam is current.  Hypertension This is a chronic problem. The problem is controlled. Pertinent negatives include no anxiety, blurred vision, chest pain, neck pain, orthopnea, palpitations, peripheral edema or shortness of breath. Treatments tried: currently taking HCTZ 25 mg, Fosinopril 40 mg. The current treatment provides moderate improvement. There are no compliance problems.    Thyroid Biopsy Pt had a thyroid biopsy done on 10/04/2015 at [facility]. Pt would like to discuss the results (in Epic). Were benign, but does not quite understand what that means.     Review of Systems  Constitutional: Negative for fatigue.  Eyes: Negative for blurred vision.  Respiratory: Negative for shortness of breath.   Cardiovascular: Negative for chest pain, palpitations and orthopnea.  Endocrine: Negative for polydipsia, polyphagia and polyuria.  Musculoskeletal: Negative for neck pain.  Neurological: Negative for weakness.   BP 118/74  mmHg  Pulse 92  Temp(Src) 98.2 F (36.8 C) (Oral)  Resp 16  Wt 187 lb (84.823 kg)   Patient Active Problem List   Diagnosis Date Noted  . Thyroid nodule 10/14/2015  . Abnormal cervical Papanicolaou smear 01/11/2015  . Abnormal loss of weight 01/11/2015  . Absolute anemia 01/11/2015  . Cardiac murmur 01/11/2015  . CN (constipation) 01/11/2015  . Calcium blood increased 01/11/2015  . LBP (low back pain) 01/11/2015  . Adiposity 01/11/2015  . Bursitis of elbow 01/11/2015  . Leg paresthesia 01/11/2015  . Neuralgia neuritis, sciatic nerve 01/11/2015  . Avitaminosis D 01/11/2015  . Essential (primary) hypertension 11/22/2014  . Controlled type 2 diabetes mellitus without complication (HCC) 11/22/2014   Past Medical History  Diagnosis Date  . Anemia   . Diabetes mellitus without complication (HCC)   . Hypertension    Current Outpatient Prescriptions on File Prior to Visit  Medication Sig  . fosinopril (MONOPRIL) 40 MG tablet Take 1 tablet (40 mg total) by mouth daily.  . hydrochlorothiazide (HYDRODIURIL) 25 MG tablet Take 1 tablet (25 mg total) by mouth daily.  . meloxicam (MOBIC) 15 MG tablet Take 1 tablet (15 mg total) by mouth daily. (Patient taking differently: Take 15 mg by mouth as needed. )  . metFORMIN (GLUCOPHAGE) 1000 MG tablet Take 1 tablet (1,000 mg total) by mouth daily with breakfast. (Patient taking differently: Take 1,000 mg by mouth 2 (two) times daily with a meal. )  . MULTIPLE VITAMINS-CALCIUM PO Take 1 tablet by mouth daily.   . pioglitazone (ACTOS) 45 MG tablet Take 1 tablet (45 mg total) by mouth daily.  . saxagliptin HCl (ONGLYZA) 5 MG TABS tablet Take 1 tablet (5 mg total) by  mouth daily.  . fluticasone (FLONASE) 50 MCG/ACT nasal spray Place 2 sprays into both nostrils daily. (Patient not taking: Reported on 10/13/2015)   No current facility-administered medications on file prior to visit.   Allergies  Allergen Reactions  . Sulfa Antibiotics    Past  Surgical History  Procedure Laterality Date  . Tubal ligation    . Cesarean section      1981 1988  . Biopsy thyroid     Social History   Social History  . Marital Status: Widowed    Spouse Name: N/A  . Number of Children: 2  . Years of Education: College   Occupational History  . Retired    Social History Main Topics  . Smoking status: Never Smoker   . Smokeless tobacco: Never Used  . Alcohol Use: No  . Drug Use: No  . Sexual Activity: Not on file   Other Topics Concern  . Not on file   Social History Narrative   Family History  Problem Relation Age of Onset  . Diabetes Mother   . Heart failure Mother   . Diabetes Sister   . Healthy Sister   . Healthy Sister   . Healthy Sister   . Diabetes Brother   . Diabetes Sister   . Heart failure Father   . Brain cancer Brother   . Throat cancer Brother   . Glaucoma Other     Runs on Paternal Side        Objective:   Physical Exam  Constitutional: She appears well-developed and well-nourished.  Cardiovascular: Normal rate, regular rhythm and normal heart sounds.   Pulmonary/Chest: Effort normal and breath sounds normal. No respiratory distress.  Neurological:  Monofilament normal.  Psychiatric: She has a normal mood and affect. Her behavior is normal.  BP 118/74 mmHg  Pulse 92  Temp(Src) 98.2 F (36.8 C) (Oral)  Resp 16  Wt 187 lb (84.823 kg)     Assessment & Plan:   1. Controlled type 2 diabetes mellitus without complication, without long-term current use of insulin (HCC) Stable. Continue current medications and plan of care. Work on exercise.  - POCT glycosylated hemoglobin (Hb A1C) Results for orders placed or performed in visit on 10/13/15  POCT glycosylated hemoglobin (Hb A1C)  Result Value Ref Range   Hemoglobin A1C 6.5    Est. average glucose Bld gHb Est-mCnc 140     Diabetic Foot Exam - Simple   Simple Foot Form  Diabetic Foot exam was performed with the following findings:  Yes 10/13/2015 11:32  AM  Visual Inspection  No deformities, no ulcerations, no other skin breakdown bilaterally:  Yes  Sensation Testing  Intact to touch and monofilament testing bilaterally:  Yes  Pulse Check  Posterior Tibialis and Dorsalis pulse intact bilaterally:  Yes  Comments     2. Essential (primary) hypertension Stable. Continue current medications and plan of care.  3. Thyroid nodule Non-toxic. Stable. Reviewed biopsy results today. Discussed benign nature.  Follow up with ENT as scheduled.    Patient seen and examined by Leo GrosserNancy J. Debra Colon, MD, and note scribed by Allene DillonEmily Drozdowski, CMA.  I have reviewed the document for accuracy and completeness and I agree with above. Leo Grosser- Montrice Gracey J. Lizabeth Fellner, MD   Lorie PhenixNancy Enoc Getter, MD

## 2015-10-14 DIAGNOSIS — E041 Nontoxic single thyroid nodule: Secondary | ICD-10-CM | POA: Insufficient documentation

## 2015-10-16 NOTE — Telephone Encounter (Signed)
Pt advised on voicemail that we do not have a secure way to email orders and we can either fax it over or can put an order up front for patient to pick up and take it to them, advised patient to call us back and let us know what she would prefer-aa

## 2015-11-14 ENCOUNTER — Other Ambulatory Visit: Payer: Self-pay | Admitting: Family Medicine

## 2015-11-14 DIAGNOSIS — I10 Essential (primary) hypertension: Secondary | ICD-10-CM

## 2015-12-22 ENCOUNTER — Ambulatory Visit: Payer: 59 | Admitting: Family Medicine

## 2015-12-27 ENCOUNTER — Ambulatory Visit (INDEPENDENT_AMBULATORY_CARE_PROVIDER_SITE_OTHER): Payer: 59 | Admitting: Family Medicine

## 2015-12-27 ENCOUNTER — Encounter: Payer: Self-pay | Admitting: Family Medicine

## 2015-12-27 DIAGNOSIS — H02403 Unspecified ptosis of bilateral eyelids: Secondary | ICD-10-CM

## 2015-12-27 DIAGNOSIS — I10 Essential (primary) hypertension: Secondary | ICD-10-CM

## 2015-12-27 DIAGNOSIS — E119 Type 2 diabetes mellitus without complications: Secondary | ICD-10-CM | POA: Diagnosis not present

## 2015-12-27 MED ORDER — PIOGLITAZONE HCL 45 MG PO TABS: 45.0000 mg | ORAL_TABLET | Freq: Every day | ORAL | Status: DC

## 2015-12-27 MED ORDER — HYDROCHLOROTHIAZIDE 25 MG PO TABS: 25.0000 mg | ORAL_TABLET | Freq: Every day | ORAL | Status: DC

## 2015-12-27 MED ORDER — FOSINOPRIL SODIUM 40 MG PO TABS: 40.0000 mg | ORAL_TABLET | Freq: Every day | ORAL | Status: DC

## 2015-12-27 MED ORDER — METFORMIN HCL 1000 MG PO TABS: 1000.0000 mg | ORAL_TABLET | Freq: Two times a day (BID) | ORAL | Status: DC

## 2015-12-27 MED ORDER — SAXAGLIPTIN HCL 5 MG PO TABS: 5.0000 mg | ORAL_TABLET | Freq: Every day | ORAL | Status: DC

## 2015-12-27 NOTE — Progress Notes (Signed)
Patient: Natasha Phillips Female    DOB: 05/24/1951   65 y.o.   MRN: [id number] Visit Date: 12/27/2015  Today's Provider: Lorie PhenixNancy Chey Cho, MD   Chief Complaint  Patient presents with  . Diabetes    follow-up   Subjective:    HPI  Diabetes Mellitus Type II, Follow-up:   Lab Results  Component Value Date   HGBA1C 6.5 10/13/2015   HGBA1C 6.4 06/22/2015   HGBA1C 5.9 03/14/2015   Last seen for diabetes 2 months ago.  Management since then includes no changes. She reports excellent compliance with treatment. She is not having side effects.  Current symptoms include weight loss stable. Patient concerned about weight loss. Home blood sugar records: fasting range: 130  Episodes of hypoglycemia? no   Current Insulin Regimen:  Most Recent Eye Exam: 12/2015 Weight trend: decreasing steadily Prior visit with dietician: no Current diet: in general, a "healthy" diet   Current exercise: walking and weightlifting  Pertinent Labs:    Component Value Date/Time   CHOL 118 07/20/2015 1220   TRIG 44 07/20/2015 1220   HDL 50 07/20/2015 1220   LDLCALC 59 07/20/2015 1220   CREATININE 0.82 07/20/2015 1220   CREATININE 0.8 11/09/2013    Wt Readings from Last 3 Encounters:  12/27/15 186 lb (84.369 kg)  10/13/15 187 lb (84.823 kg)  10/04/15 187 lb (84.823 kg)   -----------------------------------------------------------------------------------------------------------------------  Patient c/o swelling on upper eye lids for about 2 months. Patient denies pain or visual disturbance. Patient reports that she has been using cold compresses, reports mild relief.       Allergies  Allergen Reactions  . Sulfa Antibiotics    Current Meds  Medication Sig  . fosinopril (MONOPRIL) 40 MG tablet Take 1 tablet (40 mg total) by mouth daily.  . hydrochlorothiazide (HYDRODIURIL) 25 MG tablet Take 1 tablet (25 mg total) by mouth daily.  . meloxicam (MOBIC) 15 MG tablet Take 1 tablet (15 mg  total) by mouth daily. (Patient taking differently: Take 15 mg by mouth as needed. )  . metFORMIN (GLUCOPHAGE) 1000 MG tablet Take 1 tablet (1,000 mg total) by mouth 2 (two) times daily with a meal.  . MULTIPLE VITAMINS-CALCIUM PO Take 1 tablet by mouth daily.   . pioglitazone (ACTOS) 45 MG tablet Take 1 tablet (45 mg total) by mouth daily.  . saxagliptin HCl (ONGLYZA) 5 MG TABS tablet Take 1 tablet (5 mg total) by mouth daily.  . [DISCONTINUED] fosinopril (MONOPRIL) 40 MG tablet TAKE 1 TABLET BY MOUTH EVERY DAY  . [DISCONTINUED] hydrochlorothiazide (HYDRODIURIL) 25 MG tablet TAKE 1 TABLET BY MOUTH EVERY DAY  . [DISCONTINUED] metFORMIN (GLUCOPHAGE) 1000 MG tablet Take 1 tablet (1,000 mg total) by mouth daily with breakfast. (Patient taking differently: Take 1,000 mg by mouth 2 (two) times daily with a meal. )  . [DISCONTINUED] pioglitazone (ACTOS) 45 MG tablet Take 1 tablet (45 mg total) by mouth daily.  . [DISCONTINUED] saxagliptin HCl (ONGLYZA) 5 MG TABS tablet Take 1 tablet (5 mg total) by mouth daily.    Review of Systems  Constitutional: Negative.   Eyes:       Puffiness on upper eye lids  Cardiovascular: Negative.   Endocrine: Negative.   Psychiatric/Behavioral: Negative.     Social History  Substance Use Topics  . Smoking status: Never Smoker   . Smokeless tobacco: Never Used  . Alcohol Use: No   Objective:   BP 138/86 mmHg  Pulse 76  Temp(Src) 98.3  F (36.8 C) (Oral)  Resp 16  Ht  (1.549 m)  Wt 186 lb (84.369 kg)  BMI 35.16 kg/m2  Physical Exam  Constitutional: She is oriented to person, place, and time. She appears well-developed and well-nourished.  Cardiovascular: Normal rate, regular rhythm and normal heart sounds.   Pulmonary/Chest: Effort normal and breath sounds normal.  Neurological: She is alert and oriented to person, place, and time.  Psychiatric: She has a normal mood and affect. Her behavior is normal. Judgment and thought content normal.        Assessment & Plan:      1. Essential (primary) hypertension Stable. Continue treatment.  - hydrochlorothiazide (HYDRODIURIL) 25 MG tablet; Take 1 tablet (25 mg total) by mouth daily.  Dispense: 90 tablet; Refill: 3 - fosinopril (MONOPRIL) 40 MG tablet; Take 1 tablet (40 mg total) by mouth daily.  Dispense: 90 tablet; Refill: 3  2. Type 2 diabetes mellitus without complication, without long-term current use of insulin (HCC) Stable. Patient advised to continue eating healthy and exercise daily. Patient to follow-up in august with Dr. Sherrie Mustache. - saxagliptin HCl (ONGLYZA) 5 MG TABS tablet; Take 1 tablet (5 mg total) by mouth daily.  Dispense: 90 tablet; Refill: 3 - pioglitazone (ACTOS) 45 MG tablet; Take 1 tablet (45 mg total) by mouth daily.  Dispense: 90 tablet; Refill: 3 - metFORMIN (GLUCOPHAGE) 1000 MG tablet; Take 1 tablet (1,000 mg total) by mouth 2 (two) times daily with a meal.  Dispense: 180 tablet; Refill: 3  3. Ptosis of both eyelids New problem. Patient advised to call if symptoms worsen may need referral to [facility].    Patient seen and examined by Dr. Leo Grosser, and note scribed by Liz Beach. Dimas, CMA.  I have reviewed the document for accuracy and completeness and I agree with above. - Leo Grosser, MD   Lorie Phenix, MD  [facility]

## 2016-01-17 ENCOUNTER — Telehealth: Payer: Self-pay | Admitting: Emergency Medicine

## 2016-01-17 DIAGNOSIS — E119 Type 2 diabetes mellitus without complications: Secondary | ICD-10-CM

## 2016-01-17 MED ORDER — SAXAGLIPTIN HCL 5 MG PO TABS: 5.0000 mg | ORAL_TABLET | Freq: Every day | ORAL | Status: DC

## 2016-01-17 NOTE — Telephone Encounter (Signed)
Pt calling about her prior authorization for her Onglyza. Pt is very frustrated because the pharmacy keeps telling her that we are suppose to send something to the pharmacy but that is not how PA's work and I explained this to her. I told her i would ask if there has been anything come across your desk. Pt is formally a Elease HashimotoMaloney pt but is scheduled to see Dr. Sherrie MustacheFisher. Thanks

## 2016-01-17 NOTE — Telephone Encounter (Signed)
i haven't seen a prior authorization form. I'll Send new rx to pharmacy under my name so they send prior auth information to me.

## 2016-01-17 NOTE — Telephone Encounter (Signed)
Dr. Sherrie MustacheFisher, have you seen a PA form for this patient?

## 2016-01-19 MED ORDER — SITAGLIPTIN PHOSPHATE 100 MG PO TABS
100.0000 mg | ORAL_TABLET | Freq: Every day | ORAL | Status: DC
Start: 2016-01-19 — End: 2016-01-26

## 2016-01-19 NOTE — Telephone Encounter (Signed)
Medication sent to pharmacy. Pt advised.

## 2016-01-19 NOTE — Telephone Encounter (Signed)
Form received and placed on Dr. Theodis AguasFisher's desk for review.

## 2016-01-19 NOTE — Telephone Encounter (Signed)
Please advise patient that Insurance no longer covers Onglyza. They cover Januvia instead. Reviewed chart and see that she was previously on Januvia but was changed to Onglyza in 2015 because of insurance. Need to switch back to Januvia 100mg  once a day, #90, rf x 3.

## 2016-01-19 NOTE — Telephone Encounter (Signed)
Called pharmacy and requested that they resend the Prior Auth form. Pharmacist states she would refax it, but she has already sent it twice. Pharmacist also states that the form does not look like a regular Prior Auth form. The form is actually a step therapy form. Patients insurance requires her to try other medications before a prior authorization is done and approved. Awaiting  form to be faxed to our office.

## 2016-01-23 ENCOUNTER — Telehealth: Payer: Self-pay | Admitting: Family Medicine

## 2016-01-23 NOTE — Telephone Encounter (Signed)
Patient was notified. Patient express understanding.

## 2016-01-23 NOTE — Telephone Encounter (Signed)
Patient called office concerning Januvia rx that she was given on 01/19/2016. Patient stated that since she bean taking Januvia she has had severe diarrhea. Patient also stated that she has been feeling discomfort in her chest. Patient stated that one morning she had complete numbness in her right hand. Patient wanted to know if she should continue Januvia or try a different medication? Please advise?

## 2016-01-23 NOTE — Telephone Encounter (Signed)
Can stop taking Januvia for now. If symptoms go away we will need to fill out a prior authorization to get Onglyza approved. If they do not go away after stopping Januvia she will need to come in for evaluation

## 2016-01-24 ENCOUNTER — Other Ambulatory Visit: Payer: Self-pay | Admitting: Obstetrics and Gynecology

## 2016-01-24 DIAGNOSIS — Z1231 Encounter for screening mammogram for malignant neoplasm of breast: Secondary | ICD-10-CM

## 2016-01-26 ENCOUNTER — Telehealth: Payer: Self-pay | Admitting: Family Medicine

## 2016-01-26 DIAGNOSIS — E119 Type 2 diabetes mellitus without complications: Secondary | ICD-10-CM

## 2016-01-26 MED ORDER — SAXAGLIPTIN HCL 5 MG PO TABS: 5.0000 mg | ORAL_TABLET | Freq: Every day | ORAL | Status: DC

## 2016-01-26 NOTE — Telephone Encounter (Signed)
Pt states she was taken off of her diabetes medication due to having diarrhea.  Pt called stating her symptoms have gotten better.  Pt is asking what diabetes medication will she be taking now?  YN#[phone]/MVCB#[phone]/MW

## 2016-01-26 NOTE — Telephone Encounter (Signed)
We should be able to get insurance to cover Onglyza now, since she is having side effects from Januvia. Have sent new rx to pharmacy. May take a few days for prior authorization to go through.

## 2016-01-26 NOTE — Telephone Encounter (Signed)
Pt informed

## 2016-01-30 ENCOUNTER — Telehealth: Payer: Self-pay | Admitting: Family Medicine

## 2016-01-30 NOTE — Telephone Encounter (Signed)
Pt states that her Onglyza 5MG  still has not had approval through insurance company.She gets this medication at [facility] in [place].Pt would like phone call concerning this today

## 2016-01-30 NOTE — Telephone Encounter (Signed)
Called pharmacy to see it insurance approved pt rx for onglyza. [facility] stated that pt's insurance approved rx however out-of-pocket cost is still $112/50. Please advise?

## 2016-02-08 ENCOUNTER — Ambulatory Visit
Admission: RE | Admit: 2016-02-08 | Discharge: 2016-02-08 | Disposition: A | Discharge status: Home / Self Care | Payer: 59 | Source: Ambulatory Visit | Attending: Obstetrics and Gynecology | Admitting: Obstetrics and Gynecology

## 2016-02-08 ENCOUNTER — Other Ambulatory Visit: Payer: Self-pay | Admitting: Obstetrics and Gynecology

## 2016-02-08 DIAGNOSIS — Z1231 Encounter for screening mammogram for malignant neoplasm of breast: Secondary | ICD-10-CM

## 2016-02-13 ENCOUNTER — Ambulatory Visit: Payer: 59 | Admitting: Physician Assistant

## 2016-02-27 ENCOUNTER — Encounter: Payer: Self-pay | Admitting: Family Medicine

## 2016-02-27 ENCOUNTER — Ambulatory Visit (INDEPENDENT_AMBULATORY_CARE_PROVIDER_SITE_OTHER): Payer: 59 | Admitting: Family Medicine

## 2016-02-27 VITALS — BP 112/70 | HR 86 | Temp 98.3°F | Resp 16 | Ht 61.0 in | Wt 184.0 lb

## 2016-02-27 DIAGNOSIS — E119 Type 2 diabetes mellitus without complications: Secondary | ICD-10-CM

## 2016-02-27 DIAGNOSIS — I10 Essential (primary) hypertension: Secondary | ICD-10-CM | POA: Diagnosis not present

## 2016-02-27 LAB — POCT GLYCOSYLATED HEMOGLOBIN (HGB A1C)
Est. average glucose Bld gHb Est-mCnc: 140
Hemoglobin A1C: 6.5

## 2016-02-27 NOTE — Progress Notes (Signed)
Patient: Natasha Phillips Female    DOB: 11/19/1950   65 y.o.   MRN: [id number] Visit Date: 02/27/2016  Today's Provider: Mila Merryonald Nila Winker, MD   Chief Complaint  Patient presents with  . Follow-up  . Diabetes  . Hypertension   Subjective:    HPI   Diabetes Mellitus Type II, Follow-up:   Lab Results  Component Value Date   HGBA1C 6.5 10/13/2015   HGBA1C 6.4 06/22/2015   HGBA1C 5.9 03/14/2015   Last seen for diabetes 2 months ago.  Management since then includes; no changes. On 01/17/16 switched from onglyza to [place] due to insurance formulary. On 01/23/16 patient called with symptoms of diarrhea, chest discomfort, and numbness. Advised to put [place]ia on hold. Symptoms resolved after stopping Januvia and pt was changed back to onglyza. She reports good compliance with treatment. She is not having side effects. none Current symptoms include none and have been unchanged. Home blood sugar records: fasting range: 130-135  Episodes of hypoglycemia? no   Current Insulin Regimen: n/a Most Recent Eye Exam:  due Weight trend: stable Prior visit with dietician: no Current diet: well balanced Current exercise: walking  ----------------------------------------------------------------   Hypertension, follow-up:  BP Readings from Last 3 Encounters:  02/27/16 112/70  12/27/15 138/86  10/13/15 118/74    She was last seen for hypertension 2 months ago.  BP at that visit was 138/86. Management since that visit includes; no changes.She reports good compliance with treatment. She is not having side effects. none She is exercising. She is adherent to low salt diet.   Outside blood pressures are n/a. She is experiencing none.  Patient denies none.   Cardiovascular risk factors include diabetes mellitus.  Use of agents associated with hypertension: none.   ----------------------------------------------------------------     Allergies  Allergen Reactions  . Januvia  [Sitagliptin] Diarrhea  . Sulfa Antibiotics    Current Meds  Medication Sig  . fosinopril (MONOPRIL) 40 MG tablet Take 1 tablet (40 mg total) by mouth daily.  . hydrochlorothiazide (HYDRODIURIL) 25 MG tablet Take 1 tablet (25 mg total) by mouth daily.  . meloxicam (MOBIC) 15 MG tablet Take 1 tablet (15 mg total) by mouth daily. (Patient taking differently: Take 15 mg by mouth as needed. )  . metFORMIN (GLUCOPHAGE) 1000 MG tablet Take 1 tablet (1,000 mg total) by mouth 2 (two) times daily with a meal.  . MULTIPLE VITAMINS-CALCIUM PO Take 1 tablet by mouth daily.   . pioglitazone (ACTOS) 45 MG tablet Take 1 tablet (45 mg total) by mouth daily.  . saxagliptin HCl (ONGLYZA) 5 MG TABS tablet Take 1 tablet (5 mg total) by mouth daily.    Review of Systems  Constitutional: Negative for appetite change, chills, fatigue and fever.  Respiratory: Negative for chest tightness and shortness of breath.   Cardiovascular: Negative for chest pain and palpitations.  Gastrointestinal: Negative for abdominal pain, nausea and vomiting.  Neurological: Negative for dizziness and weakness.    Social History  Substance Use Topics  . Smoking status: Never Smoker  . Smokeless tobacco: Never Used  . Alcohol use No   Objective:   BP 112/70 (BP Location: Right Arm, Patient Position: Sitting, Cuff Size: Large)   Pulse 86   Temp 98.3 F (36.8 C) (Oral)   Resp 16   Ht 5\' 1"  (1.549 m)   Wt 184 lb (83.5 kg)   SpO2 100%   BMI 34.77 kg/m   Physical Exam   General  Appearance:    Alert, cooperative, no distress  Eyes:    PERRL, conjunctiva/corneas clear, EOM's intact       Lungs:     Clear to auscultation bilaterally, respirations unlabored  Heart:    Regular rate and rhythm  Neurologic:   Awake, alert, oriented x 3. No apparent focal neurological           defect.         Results for orders placed or performed in visit on 02/27/16  POCT glycosylated hemoglobin (Hb A1C)  Result Value Ref Range    Hemoglobin A1C 6.5    Est. average glucose Bld gHb Est-mCnc 140        Assessment & Plan:     1. Controlled type 2 diabetes mellitus without complication, without long-term current use of insulin (HCC) Well controlled.  Continue current medications.   - POCT glycosylated hemoglobin (Hb A1C)  2. Essential (primary) hypertension Well controlled.  Continue current medications.    Return in about 6 months (around 08/29/2016).        Mila Merryonald Michala Deblanc, MD  [facility]

## 2016-03-07 ENCOUNTER — Other Ambulatory Visit: Payer: Self-pay

## 2016-03-07 DIAGNOSIS — I10 Essential (primary) hypertension: Secondary | ICD-10-CM

## 2016-03-07 MED ORDER — FOSINOPRIL SODIUM 40 MG PO TABS: 40.0000 mg | ORAL_TABLET | Freq: Every day | ORAL | 4 refills | Status: DC

## 2016-03-07 NOTE — Telephone Encounter (Signed)
Pharmacy requesting refill for 90 day supply

## 2016-03-15 ENCOUNTER — Other Ambulatory Visit: Payer: Self-pay

## 2016-03-15 DIAGNOSIS — I10 Essential (primary) hypertension: Secondary | ICD-10-CM

## 2016-03-15 MED ORDER — FOSINOPRIL SODIUM 40 MG PO TABS: 40.0000 mg | ORAL_TABLET | Freq: Every day | ORAL | 4 refills | Status: DC

## 2016-03-15 MED ORDER — HYDROCHLOROTHIAZIDE 25 MG PO TABS: 25.0000 mg | ORAL_TABLET | Freq: Every day | ORAL | 1 refills | Status: DC

## 2016-07-17 ENCOUNTER — Other Ambulatory Visit: Payer: Self-pay | Admitting: Family Medicine

## 2016-07-17 DIAGNOSIS — E119 Type 2 diabetes mellitus without complications: Secondary | ICD-10-CM

## 2016-08-30 ENCOUNTER — Encounter: Payer: Self-pay | Admitting: Family Medicine

## 2016-08-30 ENCOUNTER — Ambulatory Visit (INDEPENDENT_AMBULATORY_CARE_PROVIDER_SITE_OTHER): Payer: 59 | Admitting: Family Medicine

## 2016-08-30 VITALS — BP 110/78 | HR 66 | Temp 98.3°F | Resp 16 | Ht 61.0 in | Wt 182.0 lb

## 2016-08-30 DIAGNOSIS — E119 Type 2 diabetes mellitus without complications: Secondary | ICD-10-CM | POA: Diagnosis not present

## 2016-08-30 DIAGNOSIS — E559 Vitamin D deficiency, unspecified: Secondary | ICD-10-CM | POA: Diagnosis not present

## 2016-08-30 DIAGNOSIS — I10 Essential (primary) hypertension: Secondary | ICD-10-CM | POA: Diagnosis not present

## 2016-08-30 LAB — POCT GLYCOSYLATED HEMOGLOBIN (HGB A1C)
Est. average glucose Bld gHb Est-mCnc: 128
HEMOGLOBIN A1C: 6.2

## 2016-08-30 NOTE — Progress Notes (Signed)
Patient: Natasha Phillips Female    DOB: 09-07-1950   66 y.o.   MRN: [id number] Visit Date: 08/30/2016  Today's Provider: Mila Merry, MD   Chief Complaint  Patient presents with  . Follow-up  . Diabetes  . Hypertension   Subjective:    HPI   Diabetes Mellitus Type II, Follow-up:   Lab Results  Component Value Date   HGBA1C 6.5 02/27/2016   HGBA1C 6.5 10/13/2015   HGBA1C 6.4 06/22/2015   Last seen for diabetes 6 years ago.  Management since then includes; no changes. She reports good compliance with treatment. She is not having side effects. nnoe Current symptoms include none and have been unchanged. Home blood sugar records: fasting range: 120-130  Episodes of hypoglycemia? no   Current Insulin Regimen: n/a  Most Recent Eye Exam: due Weight trend: stable Prior visit with dietician: no Current diet: well balanced Current exercise: walking  ----------------------------------------------------------------   Hypertension, follow-up:  BP Readings from Last 3 Encounters:  08/30/16 110/78  02/27/16 112/70  12/27/15 138/86    She was last seen for hypertension 6 months ago.  BP at that visit was 112/70. Management since that visit includes; no changes.She reports good compliance with treatment. She is not having side effects. none She is exercising. She is adherent to low salt diet.   Outside blood pressures are not checking. She is experiencing none.  Patient denies none.   Cardiovascular risk factors include diabetes mellitus.  Use of agents associated with hypertension: none.   ----------------------------------------------------------------    Allergies  Allergen Reactions  . Januvia [Sitagliptin] Diarrhea  . Sulfa Antibiotics      Current Outpatient Prescriptions:  .  fosinopril (MONOPRIL) 40 MG tablet, Take 1 tablet (40 mg total) by mouth daily., Disp: 90 tablet, Rfl: 4 .  hydrochlorothiazide (HYDRODIURIL) 25 MG tablet, Take 1  tablet (25 mg total) by mouth daily., Disp: 90 tablet, Rfl: 1 .  meloxicam (MOBIC) 15 MG tablet, Take 1 tablet (15 mg total) by mouth daily. (Patient taking differently: Take 15 mg by mouth as needed. ), Disp: 90 tablet, Rfl: 3 .  metFORMIN (GLUCOPHAGE) 1000 MG tablet, Take 1 tablet (1,000 mg total) by mouth 2 (two) times daily with a meal., Disp: 180 tablet, Rfl: 3 .  MULTIPLE VITAMINS-CALCIUM PO, Take 1 tablet by mouth daily. , Disp: , Rfl:  .  pioglitazone (ACTOS) 45 MG tablet, TAKE 1 TABLET BY MOUTH EVERY DAY, Disp: 90 tablet, Rfl: 4 .  saxagliptin HCl (ONGLYZA) 5 MG TABS tablet, Take 1 tablet (5 mg total) by mouth daily., Disp: 90 tablet, Rfl: 3  Review of Systems  Constitutional: Negative for appetite change, chills, fatigue and fever.  Respiratory: Negative for chest tightness and shortness of breath.   Cardiovascular: Negative for chest pain and palpitations.  Gastrointestinal: Negative for abdominal pain, nausea and vomiting.  Neurological: Negative for dizziness and weakness.    Social History  Substance Use Topics  . Smoking status: Never Smoker  . Smokeless tobacco: Never Used  . Alcohol use No   Objective:   BP 110/78 (BP Location: Right Arm, Patient Position: Sitting, Cuff Size: Normal)   Pulse 66   Temp 98.3 F (36.8 C) (Oral)   Resp 16   Ht 5\' 1"  (1.549 m)   Wt 182 lb (82.6 kg)   SpO2 99%   BMI 34.39 kg/m   Physical Exam   General Appearance:    Alert, cooperative, no distress  Eyes:  PERRL, conjunctiva/corneas clear, EOM's intact       Lungs:     Clear to auscultation bilaterally, respirations unlabored  Heart:    Regular rate and rhythm  Neurologic:   Awake, alert, oriented x 3. No apparent focal neurological           defect.       Results for orders placed or performed in visit on 08/30/16  POCT glycosylated hemoglobin (Hb A1C)  Result Value Ref Range   Hemoglobin A1C 6.2    Est. average glucose Bld gHb Est-mCnc 128        Assessment & Plan:      1. Controlled type 2 diabetes mellitus without complication, without long-term current use of insulin (HCC) Well controlled.  Continue current medications.   - POCT glycosylated hemoglobin (Hb A1C) - Lipid panel - Comprehensive metabolic panel  2. Essential (primary) hypertension Well controlled.  Continue current medications.   - Comprehensive metabolic panel  3. Avitaminosis D  - VITAMIN D 25 Hydroxy (Vit-D Deficiency, Fractures)       Mila Merryonald Akeelah Seppala, MD  [facility]

## 2016-09-07 LAB — COMPREHENSIVE METABOLIC PANEL
ALBUMIN: 4 g/dL (ref 3.6–4.8)
ALT: 7 IU/L (ref 0–32)
AST: 12 IU/L (ref 0–40)
Albumin/Globulin Ratio: 1.3 (ref 1.2–2.2)
Alkaline Phosphatase: 80 IU/L (ref 39–117)
BILIRUBIN TOTAL: 0.3 mg/dL (ref 0.0–1.2)
BUN / CREAT RATIO: 20 (ref 12–28)
BUN: 17 mg/dL (ref 8–27)
CALCIUM: 9.6 mg/dL (ref 8.7–10.3)
CHLORIDE: 98 mmol/L (ref 96–106)
CO2: 24 mmol/L (ref 18–29)
CREATININE: 0.87 mg/dL (ref 0.57–1.00)
GFR, EST AFRICAN AMERICAN: 82 mL/min/{1.73_m2} (ref >59)
GFR, EST NON AFRICAN AMERICAN: 71 mL/min/{1.73_m2} (ref >59)
GLUCOSE: 92 mg/dL (ref 65–99)
Globulin, Total: 3.1 g/dL (ref 1.5–4.5)
Potassium: 4 mmol/L (ref 3.5–5.2)
Sodium: 138 mmol/L (ref 134–144)
TOTAL PROTEIN: 7.1 g/dL (ref 6.0–8.5)

## 2016-09-07 LAB — LIPID PANEL
CHOLESTEROL TOTAL: 120 mg/dL (ref 100–199)
Chol/HDL Ratio: 2.1 ratio units (ref 0.0–4.4)
HDL: 56 mg/dL (ref >39)
LDL Calculated: 57 mg/dL (ref 0–99)
Triglycerides: 34 mg/dL (ref 0–149)
VLDL CHOLESTEROL CAL: 7 mg/dL (ref 5–40)

## 2016-09-07 LAB — VITAMIN D 25 HYDROXY (VIT D DEFICIENCY, FRACTURES): VIT D 25 HYDROXY: 39.9 ng/mL (ref 30.0–100.0)

## 2016-09-11 ENCOUNTER — Other Ambulatory Visit: Payer: Self-pay | Admitting: Family Medicine

## 2016-09-11 DIAGNOSIS — I10 Essential (primary) hypertension: Secondary | ICD-10-CM

## 2016-09-18 ENCOUNTER — Ambulatory Visit (INDEPENDENT_AMBULATORY_CARE_PROVIDER_SITE_OTHER): Payer: 59 | Admitting: Family Medicine

## 2016-09-18 ENCOUNTER — Encounter: Payer: Self-pay | Admitting: Family Medicine

## 2016-09-18 VITALS — BP 132/84 | HR 88 | Temp 98.1°F | Resp 16 | Wt 185.0 lb

## 2016-09-18 DIAGNOSIS — H9202 Otalgia, left ear: Secondary | ICD-10-CM

## 2016-09-18 MED ORDER — AMOXICILLIN 500 MG PO CAPS: 1000.0000 mg | ORAL_CAPSULE | Freq: Two times a day (BID) | ORAL | 0 refills | Status: AC

## 2016-09-18 NOTE — Patient Instructions (Signed)
   Fill prescription and take antibiotic if the pain in your ear returns within the next week.

## 2016-09-18 NOTE — Progress Notes (Signed)
Patient: Natasha Phillips Female    DOB: 12/02/1950   66 y.o.   MRN: [id number] Visit Date: 09/18/2016  Today's Provider: Mila Merryonald Jatia Musa, MD   Chief Complaint  Patient presents with  . Ear Pain    x 2 days   Subjective:    HPI Left ear pain:  Patient comes in complaining of pain in her left ear for the past 2 days. Patient describes the pain as an achy pain. She states she has tried applying a warm compression, which she feels provided some temporary relief. Patient denies ear drainage, fever, chills, cough, runny nose or other cold symptoms. However pain has resolved today.     Allergies  Allergen Reactions  . Januvia [Sitagliptin] Diarrhea  . Sulfa Antibiotics      Current Outpatient Prescriptions:  .  fosinopril (MONOPRIL) 40 MG tablet, Take 1 tablet (40 mg total) by mouth daily., Disp: 90 tablet, Rfl: 4 .  hydrochlorothiazide (HYDRODIURIL) 25 MG tablet, TAKE 1 TABLET (25 MG TOTAL) BY MOUTH DAILY., Disp: 90 tablet, Rfl: 4 .  meloxicam (MOBIC) 15 MG tablet, Take 1 tablet (15 mg total) by mouth daily. (Patient taking differently: Take 15 mg by mouth as needed. ), Disp: 90 tablet, Rfl: 3 .  metFORMIN (GLUCOPHAGE) 1000 MG tablet, Take 1 tablet (1,000 mg total) by mouth 2 (two) times daily with a meal., Disp: 180 tablet, Rfl: 3 .  MULTIPLE VITAMINS-CALCIUM PO, Take 1 tablet by mouth daily. , Disp: , Rfl:  .  pioglitazone (ACTOS) 45 MG tablet, TAKE 1 TABLET BY MOUTH EVERY DAY, Disp: 90 tablet, Rfl: 4 .  saxagliptin HCl (ONGLYZA) 5 MG TABS tablet, Take 1 tablet (5 mg total) by mouth daily., Disp: 90 tablet, Rfl: 3  Review of Systems  Constitutional: Negative for appetite change, chills, fatigue and fever.  HENT: Positive for ear pain (left ear) and hearing loss (decreased hearing in left ear). Negative for congestion, postnasal drip, rhinorrhea, sinus pain, sinus pressure, sneezing, sore throat, tinnitus and trouble swallowing.   Respiratory: Negative for chest tightness and  shortness of breath.   Cardiovascular: Negative for chest pain and palpitations.  Gastrointestinal: Negative for abdominal pain, nausea and vomiting.  Neurological: Negative for dizziness and weakness.    Social History  Substance Use Topics  . Smoking status: Never Smoker  . Smokeless tobacco: Never Used  . Alcohol use No   Objective:   BP 132/84 (BP Location: Left Arm, Patient Position: Sitting, Cuff Size: Large)   Pulse 88   Temp 98.1 F (36.7 C) (Oral)   Resp 16   Wt 185 lb (83.9 kg)   SpO2 99% Comment: room air  BMI 34.96 kg/m     Physical Exam  General Appearance:    Alert, cooperative, no distress  HENT:   ENT exam normal, no neck nodes or sinus tenderness  Eyes:    PERRL, conjunctiva/corneas clear, EOM's intact       Lungs:     Clear to auscultation bilaterally, respirations unlabored  Heart:    Regular rate and rhythm  Neurologic:   Awake, alert, oriented x 3. No apparent focal neurological           defect.           Assessment & Plan:     1. Earache on left Resolved today. Suspect she had mild viral infection that has resolved. Advised that if pain returns she may fill and take prescription for amoxicillin.  The entirety of the information documented in the History of Present Illness, Review of Systems and Physical Exam were personally obtained by me. Portions of this information were initially documented by Meyer Cory, CMA and reviewed by me for thoroughness and accuracy.        Lelon Huh, MD  [facility]

## 2016-10-28 ENCOUNTER — Encounter: Payer: Self-pay | Admitting: Family Medicine

## 2016-10-28 ENCOUNTER — Ambulatory Visit (INDEPENDENT_AMBULATORY_CARE_PROVIDER_SITE_OTHER): Payer: 59 | Admitting: Family Medicine

## 2016-10-28 DIAGNOSIS — K1379 Other lesions of oral mucosa: Secondary | ICD-10-CM

## 2016-10-28 NOTE — Progress Notes (Signed)
       Patient: Natasha Phillips Female    DOB: [birth year]   66 y.o.   MRN: [id number] Visit Date: 10/28/2016  Today's Provider: Mila Merry, MD   Chief Complaint  Patient presents with  . Mouth Lesions   Subjective:    Patient noticed yesterday that she has a sore inside her mouth on right jaw. Patient stated that sore only hurts mildly. Patient has no other symptoms.no uri symptoms.   She does not recall any trauma to area. Has had no other recent illnesses.      Allergies  Allergen Reactions  . Januvia [Sitagliptin] Diarrhea  . Sulfa Antibiotics      Current Outpatient Prescriptions:  .  fosinopril (MONOPRIL) 40 MG tablet, Take 1 tablet (40 mg total) by mouth daily., Disp: 90 tablet, Rfl: 4 .  hydrochlorothiazide (HYDRODIURIL) 25 MG tablet, TAKE 1 TABLET (25 MG TOTAL) BY MOUTH DAILY., Disp: 90 tablet, Rfl: 4 .  meloxicam (MOBIC) 15 MG tablet, Take 1 tablet (15 mg total) by mouth daily. (Patient taking differently: Take 15 mg by mouth as needed. ), Disp: 90 tablet, Rfl: 3 .  metFORMIN (GLUCOPHAGE) 1000 MG tablet, Take 1 tablet (1,000 mg total) by mouth 2 (two) times daily with a meal., Disp: 180 tablet, Rfl: 3 .  MULTIPLE VITAMINS-CALCIUM PO, Take 1 tablet by mouth daily. , Disp: , Rfl:  .  pioglitazone (ACTOS) 45 MG tablet, TAKE 1 TABLET BY MOUTH EVERY DAY, Disp: 90 tablet, Rfl: 4 .  saxagliptin HCl (ONGLYZA) 5 MG TABS tablet, Take 1 tablet (5 mg total) by mouth daily., Disp: 90 tablet, Rfl: 3  Review of Systems  Constitutional: Negative for appetite change, chills and fatigue.  HENT: Positive for mouth sores.   Respiratory: Negative for chest tightness and shortness of breath.   Cardiovascular: Negative for chest pain and palpitations.  Neurological: Negative for dizziness and weakness.    Social History  Substance Use Topics  . Smoking status: Never Smoker  . Smokeless tobacco: Never Used  . Alcohol use No   Objective:   BP 120/70 (BP Location: Right Arm,  Patient Position: Sitting, Cuff Size: Large)   Pulse 87   Temp 98.5 F (36.9 C) (Oral)   Resp 16   Wt 189 lb (85.7 kg)   SpO2 99%   BMI 35.71 kg/m     Physical Exam  General Appearance:    Alert, cooperative, no distress  HENT:   neck without nodes and sinuses nontender. Dime sized mucosal erosion right buccal mucosa. No discharge, no surrounding erythema. No other oral lesions   Eyes:    PERRL, conjunctiva/corneas clear, EOM's intact               Assessment & Plan:     1. Erosion of oral mucosa She is low risk for oral cancer. Most likely traumatic or viral.  Gargle with warm salt water every 3-4 hours throughout the day. Call if any worsening or new symptoms. Otherwise follow up to recheck in 1 week.        Mila Merry, MD  [facility]

## 2016-10-28 NOTE — Patient Instructions (Signed)
Rinse mouth well with warm salt water every 3-4 hours for the next 10 day.

## 2016-11-07 ENCOUNTER — Encounter: Payer: Self-pay | Admitting: Family Medicine

## 2016-11-07 ENCOUNTER — Ambulatory Visit (INDEPENDENT_AMBULATORY_CARE_PROVIDER_SITE_OTHER): Payer: 59 | Admitting: Family Medicine

## 2016-11-07 VITALS — BP 126/82 | HR 92 | Temp 98.1°F | Resp 16 | Wt 181.0 lb

## 2016-11-07 DIAGNOSIS — E119 Type 2 diabetes mellitus without complications: Secondary | ICD-10-CM

## 2016-11-07 DIAGNOSIS — K1379 Other lesions of oral mucosa: Secondary | ICD-10-CM

## 2016-11-07 LAB — POCT UA - MICROALBUMIN: MICROALBUMIN (UR) POC: 20 mg/L

## 2016-11-07 NOTE — Progress Notes (Signed)
       Patient: Natasha Phillips Female    DOB: 05/07/1951   66 y.o.   MRN: [id number] Visit Date: 11/07/2016  Today's Provider: Mila Merryonald Fisher, MD   Chief Complaint  Patient presents with  . Follow-up   Subjective:    HPI Follow up of Erosion of oral mucosa:  Patient was last seen for this problem 10 days ago. Treatment during that visit includes advising patient to gargle with warm salt water every 3-4 hours throughout the day, and follow up for a recheck in 1 week. Patient reports good compliance with treatment and states this problem has now resolved.     Allergies  Allergen Reactions  . Januvia [Sitagliptin] Diarrhea  . Sulfa Antibiotics      Current Outpatient Prescriptions:  .  fosinopril (MONOPRIL) 40 MG tablet, Take 1 tablet (40 mg total) by mouth daily., Disp: 90 tablet, Rfl: 4 .  hydrochlorothiazide (HYDRODIURIL) 25 MG tablet, TAKE 1 TABLET (25 MG TOTAL) BY MOUTH DAILY., Disp: 90 tablet, Rfl: 4 .  meloxicam (MOBIC) 15 MG tablet, Take 1 tablet (15 mg total) by mouth daily. (Patient taking differently: Take 15 mg by mouth as needed. ), Disp: 90 tablet, Rfl: 3 .  metFORMIN (GLUCOPHAGE) 1000 MG tablet, Take 1 tablet (1,000 mg total) by mouth 2 (two) times daily with a meal., Disp: 180 tablet, Rfl: 3 .  MULTIPLE VITAMINS-CALCIUM PO, Take 1 tablet by mouth daily. , Disp: , Rfl:  .  pioglitazone (ACTOS) 45 MG tablet, TAKE 1 TABLET BY MOUTH EVERY DAY, Disp: 90 tablet, Rfl: 4 .  saxagliptin HCl (ONGLYZA) 5 MG TABS tablet, Take 1 tablet (5 mg total) by mouth daily., Disp: 90 tablet, Rfl: 3  Review of Systems  Constitutional: Negative for appetite change, chills, fatigue and fever.  Respiratory: Negative for chest tightness and shortness of breath.   Cardiovascular: Negative for chest pain and palpitations.  Gastrointestinal: Negative for abdominal pain, nausea and vomiting.  Neurological: Negative for dizziness and weakness.    Social History  Substance Use Topics  . Smoking  status: Never Smoker  . Smokeless tobacco: Never Used  . Alcohol use No   Objective:   BP 126/82 (BP Location: Left Arm, Patient Position: Sitting, Cuff Size: Large)   Pulse 92   Temp 98.1 F (36.7 C) (Oral)   Resp 16   Wt 181 lb (82.1 kg)   SpO2 96% Comment: room air  BMI 34.20 kg/m  There were no vitals filed for this visit.   Physical Exam  HENT:   ENT exam normal, no neck nodes or sinus tenderness Lesion on last visits exam has completely resolved.       Assessment & Plan:     1.  Erosion of oral mucosa Lesion has completely resolved since previous exam, suspect bite mark or other oral trauma. No follow up needed.        Mila Merryonald Fisher, MD  [facility]

## 2016-11-21 ENCOUNTER — Other Ambulatory Visit: Payer: Self-pay | Admitting: Family Medicine

## 2016-11-21 DIAGNOSIS — E119 Type 2 diabetes mellitus without complications: Secondary | ICD-10-CM

## 2016-11-21 MED ORDER — METFORMIN HCL 1000 MG PO TABS: 1000.0000 mg | ORAL_TABLET | Freq: Two times a day (BID) | ORAL | 3 refills | Status: DC

## 2016-11-21 NOTE — Telephone Encounter (Signed)
LOV 11/07/2016. Allene DillonEmily Drozdowski, CMA

## 2016-11-21 NOTE — Telephone Encounter (Signed)
[facility] pharmacy faxed a request for the following medication. Thanks CC  metFORMIN (GLUCOPHAGE) 1000 MG tablet  Take 1 tablet twice daily with food.

## 2017-01-01 ENCOUNTER — Encounter: Payer: Self-pay | Admitting: Obstetrics and Gynecology

## 2017-01-01 ENCOUNTER — Ambulatory Visit (INDEPENDENT_AMBULATORY_CARE_PROVIDER_SITE_OTHER): Payer: 59 | Admitting: Obstetrics and Gynecology

## 2017-01-01 VITALS — BP 138/80 | HR 73 | Ht 61.0 in | Wt 182.0 lb

## 2017-01-01 DIAGNOSIS — Z1231 Encounter for screening mammogram for malignant neoplasm of breast: Secondary | ICD-10-CM | POA: Diagnosis not present

## 2017-01-01 DIAGNOSIS — Z1239 Encounter for other screening for malignant neoplasm of breast: Secondary | ICD-10-CM

## 2017-01-01 DIAGNOSIS — Z124 Encounter for screening for malignant neoplasm of cervix: Secondary | ICD-10-CM | POA: Diagnosis not present

## 2017-01-01 DIAGNOSIS — Z01419 Encounter for gynecological examination (general) (routine) without abnormal findings: Secondary | ICD-10-CM | POA: Diagnosis not present

## 2017-01-01 NOTE — Patient Instructions (Signed)
Preventive Care 40-64 Years, Female Preventive care refers to lifestyle choices and visits with your health care provider that can promote health and wellness. What does preventive care include?  A yearly physical exam. This is also called an annual well check.  Dental exams once or twice a year.  Routine eye exams. Ask your health care provider how often you should have your eyes checked.  Personal lifestyle choices, including: ? Daily care of your teeth and gums. ? Regular physical activity. ? Eating a healthy diet. ? Avoiding tobacco and drug use. ? Limiting alcohol use. ? Practicing safe sex. ? Taking low-dose aspirin daily starting at age 58. ? Taking vitamin and mineral supplements as recommended by your health care provider. What happens during an annual well check? The services and screenings done by your health care provider during your annual well check will depend on your age, overall health, lifestyle risk factors, and family history of disease. Counseling Your health care provider may ask you questions about your:  Alcohol use.  Tobacco use.  Drug use.  Emotional well-being.  Home and relationship well-being.  Sexual activity.  Eating habits.  Work and work Statistician.  Method of birth control.  Menstrual cycle.  Pregnancy history.  Screening You may have the following tests or measurements:  Height, weight, and BMI.  Blood pressure.  Lipid and cholesterol levels. These may be checked every 5 years, or more frequently if you are over 81 years old.  Skin check.  Lung cancer screening. You may have this screening every year starting at age 78 if you have a 30-pack-year history of smoking and currently smoke or have quit within the past 15 years.  Fecal occult blood test (FOBT) of the stool. You may have this test every year starting at age 65.  Flexible sigmoidoscopy or colonoscopy. You may have a sigmoidoscopy every 5 years or a colonoscopy  every 10 years starting at age 30.  Hepatitis C blood test.  Hepatitis B blood test.  Sexually transmitted disease (STD) testing.  Diabetes screening. This is done by checking your blood sugar (glucose) after you have not eaten for a while (fasting). You may have this done every 1-3 years.  Mammogram. This may be done every 1-2 years. Talk to your health care provider about when you should start having regular mammograms. This may depend on whether you have a family history of breast cancer.  BRCA-related cancer screening. This may be done if you have a family history of breast, ovarian, tubal, or peritoneal cancers.  Pelvic exam and Pap test. This may be done every 3 years starting at age 80. Starting at age 36, this may be done every 5 years if you have a Pap test in combination with an HPV test.  Bone density scan. This is done to screen for osteoporosis. You may have this scan if you are at high risk for osteoporosis.  Discuss your test results, treatment options, and if necessary, the need for more tests with your health care provider. Vaccines Your health care provider may recommend certain vaccines, such as:  Influenza vaccine. This is recommended every year.  Tetanus, diphtheria, and acellular pertussis (Tdap, Td) vaccine. You may need a Td booster every 10 years.  Varicella vaccine. You may need this if you have not been vaccinated.  Zoster vaccine. You may need this after age 5.  Measles, mumps, and rubella (MMR) vaccine. You may need at least one dose of MMR if you were born in  1955 or later. You may also need a second dose.  Pneumococcal 13-valent conjugate (PCV13) vaccine. You may need this if you have certain conditions and were not previously vaccinated.  Pneumococcal polysaccharide (PPSV23) vaccine. You may need one or two doses if you smoke cigarettes or if you have certain conditions.  Meningococcal vaccine. You may need this if you have certain  conditions.  Hepatitis A vaccine. You may need this if you have certain conditions or if you travel or work in places where you may be exposed to hepatitis A.  Hepatitis B vaccine. You may need this if you have certain conditions or if you travel or work in places where you may be exposed to hepatitis B.  Haemophilus influenzae type b (Hib) vaccine. You may need this if you have certain conditions.  Talk to your health care provider about which screenings and vaccines you need and how often you need them. This information is not intended to replace advice given to you by your health care provider. Make sure you discuss any questions you have with your health care provider. Document Released: 07/21/2015 Document Revised: 03/13/2016 Document Reviewed: 04/25/2015 Elsevier Interactive Patient Education  2017 [employer].

## 2017-01-01 NOTE — Progress Notes (Signed)
Patient ID: Natasha Phillips, female   DOB: 11/24/1950, 66 y.o.   MRN: [id number]     Gynecology Annual Exam  PCP: Malva LimesFisher, Donald E, MD  Chief Complaint:  Chief Complaint  Patient presents with  . Gynecologic Exam    History of Present Illness:Patient is a 66 y.o. G2P2 presents for annual exam. The patient has no complaints today.   LMP: No LMP recorded. Patient is postmenopausal. No postmenopausal bleeding or spotting noted by patient  The patient is sexually active. She denies dyspareunia.  The patient does perform self breast exams.  There is no notable family history of breast or ovarian cancer in her family.  The patient wears seatbelts: yes.   The patient has regular exercise: no.    The patient denies current symptoms of depression.     Review of Systems: Review of Systems  Constitutional: Negative for chills and fever.  HENT: Negative for congestion.   Respiratory: Negative for cough and shortness of breath.   Cardiovascular: Negative for chest pain and palpitations.  Gastrointestinal: Negative for abdominal pain, constipation, diarrhea, heartburn, nausea and vomiting.  Genitourinary: Negative for dysuria, frequency and urgency.  Skin: Negative for itching and rash.  Neurological: Negative for dizziness and headaches.  Endo/Heme/Allergies: Negative for polydipsia.  Psychiatric/Behavioral: Negative for depression.    Past Medical History:  Past Medical History:  Diagnosis Date  . Anemia   . Diabetes mellitus without complication (HCC)   . Hypertension     Past Surgical History:  Past Surgical History:  Procedure Laterality Date  . BIOPSY THYROID    . CESAREAN SECTION     1981 1988  . TUBAL LIGATION      Gynecologic History:  No LMP recorded. Patient is postmenopausal. Last Pap: Results were: 12/25/15 NIL and HR HPV+  Last mammogram: 02/08/16 Results were: BI-RAD I   Obstetric History: G2P2  Family History:  Family History  Problem Relation Age of Onset   . Diabetes Mother   . Heart failure Mother   . Diabetes Sister   . Healthy Sister   . Healthy Sister   . Healthy Sister   . Diabetes Brother   . Diabetes Sister   . Heart failure Father   . Brain cancer Brother   . Throat cancer Brother   . Glaucoma Other        Runs on Paternal Side  . Breast cancer Cousin     Social History:  Social History   Social History  . Marital status: Widowed    Spouse name: N/A  . Number of children: 2  . Years of education: College   Occupational History  . Retired 2014     Previously Sports coachCase Manager [employer]   Social History Main Topics  . Smoking status: Never Smoker  . Smokeless tobacco: Never Used  . Alcohol use No  . Drug use: No  . Sexual activity: No   Other Topics Concern  . Not on file   Social History Narrative  . No narrative on file    Allergies:  Allergies  Allergen Reactions  . Januvia [Sitagliptin] Diarrhea  . Sulfa Antibiotics     Medications: Prior to Admission medications   Medication Sig Start Date End Date Taking? Authorizing Provider  fosinopril (MONOPRIL) 40 MG tablet Take 1 tablet (40 mg total) by mouth daily. 03/15/16   Malva LimesFisher, Donald E, MD  hydrochlorothiazide (HYDRODIURIL) 25 MG tablet TAKE 1 TABLET (25 MG TOTAL) BY MOUTH DAILY. 09/11/16   Mila MerryFisher, Donald  E, MD  meloxicam (MOBIC) 15 MG tablet Take 1 tablet (15 mg total) by mouth daily. Patient taking differently: Take 15 mg by mouth as needed.  06/22/15   Lorie Phenix, MD  metFORMIN (GLUCOPHAGE) 1000 MG tablet Take 1 tablet (1,000 mg total) by mouth 2 (two) times daily with a meal. 11/21/16   Malva Limes, MD  MULTIPLE VITAMINS-CALCIUM PO Take 1 tablet by mouth daily.     [provider]  pioglitazone (ACTOS) 45 MG tablet TAKE 1 TABLET BY MOUTH EVERY DAY 07/17/16   Malva Limes, MD  saxagliptin HCl (ONGLYZA) 5 MG TABS tablet Take 1 tablet (5 mg total) by mouth daily. 01/26/16   Malva Limes, MD    Physical Exam Vitals: Blood pressure  138/80, pulse 73, height 5\' 1"  (1.549 m), weight 182 lb (82.6 kg).  General: NAD HEENT: normocephalic, anicteric Thyroid: no enlargement, no palpable nodules Pulmonary: No increased work of breathing, CTAB Cardiovascular: RRR, distal pulses 2+ Breast: Breast symmetrical, no tenderness, no palpable nodules or masses, no skin or nipple retraction present, no nipple discharge.  No axillary or supraclavicular lymphadenopathy. Abdomen: NABS, soft, non-tender, non-distended.  Umbilicus without lesions.  No hepatomegaly, splenomegaly or masses palpable. No evidence of hernia  Genitourinary:  External: Normal external female genitalia.  Normal urethral meatus, normal  Bartholin's and Skene's glands.    Vagina: Normal vaginal mucosa, no evidence of prolapse.    Cervix: Grossly normal in appearance, no bleeding  Uterus: Non-enlarged, mobile, normal contour.  No CMT  Adnexa: ovaries non-enlarged, no adnexal masses  Rectal: deferred  Lymphatic: no evidence of inguinal lymphadenopathy Extremities: no edema, erythema, or tenderness Neurologic: Grossly intact Psychiatric: mood appropriate, affect full  Female chaperone present for pelvic and breast  portions of the physical exam    Assessment: 66 y.o. G2P2 No problem-specific Assessment & Plan notes found for this encounter.   Plan: Problem List Items Addressed This Visit    None    Visit Diagnoses    Breast screening    -  Primary   Relevant Orders   MM DIGITAL SCREENING BILATERAL   Screening for malignant neoplasm of cervix       Relevant Orders   PapIG, HPV, rfx 16/18   Encounter for gynecological examination without abnormal finding          1) Mammogram - recommend yearly screening mammogram.  Mammogram Was ordered today  2) STI screening was offered and declined  3) ASCCP guidelines and rational discussed.  Patient opts for yearly screening interval  4) Osteoporosis  - per USPTF routine screening DEXA at age 56 - Frax 10  year major risk 3.1%, 10 year hip fracture risk 0.3%  5) Routine healthcare maintenance including cholesterol, diabetes management discussed managed by PCP  6) Colonoscopy - Screening recommended starting at age 80 for average risk individuals, age 45 for individuals deemed at increased risk (including African Americans) and recommended to continue until age 62.  For patient age 53-85 individualized approach is recommended.  Gold standard screening is via colonoscopy, Cologuard screening is an acceptable alternative for patient unwilling or unable to undergo colonoscopy.  "Colorectal cancer screening for average?risk adults: 2018 guideline update from the American Cancer Society"CA: A Cancer Journal for Clinicians: Dec 04, 2016  - normal 09/30/2011 next in 2023  7) Follow up 1 year for routine annual

## 2017-01-06 LAB — PAPIG, HPV, RFX 16/18
HPV, high-risk: NEGATIVE
PAP Smear Comment: 0

## 2017-01-07 LAB — HM DIABETES EYE EXAM

## 2017-01-31 ENCOUNTER — Encounter: Payer: Self-pay | Admitting: *Deleted

## 2017-02-03 ENCOUNTER — Encounter: Payer: Self-pay | Admitting: Family Medicine

## 2017-02-11 ENCOUNTER — Other Ambulatory Visit: Payer: Self-pay | Admitting: Obstetrics and Gynecology

## 2017-02-11 ENCOUNTER — Ambulatory Visit
Admission: RE | Admit: 2017-02-11 | Discharge: 2017-02-11 | Disposition: A | Discharge status: Home / Self Care | Payer: BLUE CROSS/BLUE SHIELD | Source: Ambulatory Visit | Attending: Obstetrics and Gynecology | Admitting: Obstetrics and Gynecology

## 2017-02-11 DIAGNOSIS — Z1239 Encounter for other screening for malignant neoplasm of breast: Secondary | ICD-10-CM

## 2017-02-11 DIAGNOSIS — Z1231 Encounter for screening mammogram for malignant neoplasm of breast: Secondary | ICD-10-CM | POA: Diagnosis present

## 2017-03-11 ENCOUNTER — Ambulatory Visit (INDEPENDENT_AMBULATORY_CARE_PROVIDER_SITE_OTHER): Payer: BLUE CROSS/BLUE SHIELD | Admitting: Family Medicine

## 2017-03-11 ENCOUNTER — Encounter: Payer: Self-pay | Admitting: Family Medicine

## 2017-03-11 VITALS — BP 130/70 | HR 84 | Temp 98.4°F | Resp 16 | Wt 178.0 lb

## 2017-03-11 DIAGNOSIS — I10 Essential (primary) hypertension: Secondary | ICD-10-CM

## 2017-03-11 DIAGNOSIS — E119 Type 2 diabetes mellitus without complications: Secondary | ICD-10-CM | POA: Diagnosis not present

## 2017-03-11 LAB — POCT GLYCOSYLATED HEMOGLOBIN (HGB A1C)
Est. average glucose Bld gHb Est-mCnc: 120
Hemoglobin A1C: 5.8

## 2017-03-11 MED ORDER — FOSINOPRIL SODIUM 40 MG PO TABS: 40.0000 mg | ORAL_TABLET | Freq: Every day | ORAL | 4 refills | Status: DC

## 2017-03-11 MED ORDER — SAXAGLIPTIN HCL 5 MG PO TABS: 5.0000 mg | ORAL_TABLET | Freq: Every day | ORAL | 4 refills | Status: DC

## 2017-03-11 MED ORDER — METFORMIN HCL 1000 MG PO TABS: 1000.0000 mg | ORAL_TABLET | Freq: Every day | ORAL | 1 refills | Status: DC

## 2017-03-11 NOTE — Patient Instructions (Signed)
   You can reduce metformin to once a day. Please call if you sugars starting running above 200.

## 2017-03-11 NOTE — Progress Notes (Signed)
Patient: Natasha Phillips Female    DOB: 11/15/1950   66 y.o.   MRN: [id number] Visit Date: 03/11/2017  Today's Provider: Mila Merryonald Vickii Volland, MD   Chief Complaint  Patient presents with  . Follow-up  . Diabetes  . Hypertension   Subjective:    HPI   Diabetes Mellitus Type II, Follow-up:   Lab Results  Component Value Date   HGBA1C 6.2 08/30/2016   HGBA1C 6.5 02/27/2016   HGBA1C 6.5 10/13/2015   Last seen for diabetes 3 months ago.  Management since then includes; no changes. She reports good compliance with treatment. She is not having side effects. none Current symptoms include none and have been unchanged. Home blood sugar records: fasting range: 125-130  Episodes of hypoglycemia? no   Current Insulin Regimen: n/a Most Recent Eye Exam: 01/07/2017 Weight trend: stable Prior visit with dietician: no Current diet: well balanced Current exercise: walking  ----------------------------------------------------------------    Hypertension, follow-up:  BP Readings from Last 3 Encounters:  01/01/17 138/80  11/07/16 126/82  10/28/16 120/70    She was last seen for hypertension 7 months ago.  BP at that visit was 110/78. Management since that visit includes; labs checked, no changes.She reports good compliance with treatment. She is not having side effects. none She is exercising. She is adherent to low salt diet.   Outside blood pressures are not checking. She is experiencing none.  Patient denies none.   Cardiovascular risk factors include diabetes mellitus.  Use of agents associated with hypertension: none.   ----------------------------------------------------------------   Wt Readings from Last 3 Encounters:  01/01/17 182 lb (82.6 kg)  11/07/16 181 lb (82.1 kg)  10/28/16 189 lb (85.7 kg)    Avitaminosis D From 08/30/2016-labs checked, no changes.      Allergies  Allergen Reactions  . Januvia [Sitagliptin] Diarrhea  . Sulfa Antibiotics       Current Outpatient Prescriptions:  .  fosinopril (MONOPRIL) 40 MG tablet, Take 1 tablet (40 mg total) by mouth daily., Disp: 90 tablet, Rfl: 4 .  hydrochlorothiazide (HYDRODIURIL) 25 MG tablet, TAKE 1 TABLET (25 MG TOTAL) BY MOUTH DAILY., Disp: 90 tablet, Rfl: 4 .  meloxicam (MOBIC) 15 MG tablet, Take 1 tablet (15 mg total) by mouth daily. (Patient taking differently: Take 15 mg by mouth as needed. ), Disp: 90 tablet, Rfl: 3 .  metFORMIN (GLUCOPHAGE) 1000 MG tablet, Take 1 tablet (1,000 mg total) by mouth 2 (two) times daily with a meal., Disp: 180 tablet, Rfl: 3 .  MULTIPLE VITAMINS-CALCIUM PO, Take 1 tablet by mouth daily. , Disp: , Rfl:  .  pioglitazone (ACTOS) 45 MG tablet, TAKE 1 TABLET BY MOUTH EVERY DAY, Disp: 90 tablet, Rfl: 4 .  saxagliptin HCl (ONGLYZA) 5 MG TABS tablet, Take 1 tablet (5 mg total) by mouth daily., Disp: 90 tablet, Rfl: 3  Review of Systems  Constitutional: Negative for appetite change, chills, fatigue and fever.  Respiratory: Negative for chest tightness and shortness of breath.   Cardiovascular: Negative for chest pain and palpitations.  Gastrointestinal: Negative for abdominal pain, nausea and vomiting.  Neurological: Negative for dizziness and weakness.    Social History  Substance Use Topics  . Smoking status: Never Smoker  . Smokeless tobacco: Never Used  . Alcohol use No   Objective:   There were no vitals taken for this visit. There were no vitals filed for this visit.   Physical Exam   General Appearance:    Alert,  cooperative, no distress  Eyes:    PERRL, conjunctiva/corneas clear, EOM's intact       Lungs:     Clear to auscultation bilaterally, respirations unlabored  Heart:    Regular rate and rhythm  Neurologic:   Awake, alert, oriented x 3. No apparent focal neurological           defect.       Results for orders placed or performed in visit on 03/11/17  POCT glycosylated hemoglobin (Hb A1C)  Result Value Ref Range   Hemoglobin  A1C 5.8    Est. average glucose Bld gHb Est-mCnc 120        Assessment & Plan:     1. Controlled type 2 diabetes mellitus without complication, without long-term current use of insulin (HCC) Well controlled.  Reduce metformin to QD - POCT glycosylated hemoglobin (Hb A1C)  2. Essential (primary) hypertension Well controlled.  Continue current medications.   - fosinopril (MONOPRIL) 40 MG tablet; Take 1 tablet (40 mg total) by mouth daily.  Dispense: 90 tablet; Refill: 4  Return in about 6 months (around 09/08/2017).     She refused flu vaccine  Mila Merry, MD  [facility]

## 2017-03-19 ENCOUNTER — Other Ambulatory Visit: Payer: Self-pay | Admitting: Family Medicine

## 2017-03-19 DIAGNOSIS — I10 Essential (primary) hypertension: Secondary | ICD-10-CM

## 2017-08-09 ENCOUNTER — Other Ambulatory Visit: Payer: Self-pay | Admitting: Family Medicine

## 2017-08-09 DIAGNOSIS — E119 Type 2 diabetes mellitus without complications: Secondary | ICD-10-CM

## 2017-09-09 ENCOUNTER — Encounter: Payer: Self-pay | Admitting: Family Medicine

## 2017-09-09 ENCOUNTER — Telehealth: Payer: Self-pay | Admitting: Family Medicine

## 2017-09-09 ENCOUNTER — Ambulatory Visit (INDEPENDENT_AMBULATORY_CARE_PROVIDER_SITE_OTHER): Payer: Medicare Other | Admitting: Family Medicine

## 2017-09-09 VITALS — BP 140/80 | HR 89 | Temp 98.0°F | Resp 16 | Ht 61.0 in | Wt 189.0 lb

## 2017-09-09 DIAGNOSIS — R0981 Nasal congestion: Secondary | ICD-10-CM

## 2017-09-09 DIAGNOSIS — E041 Nontoxic single thyroid nodule: Secondary | ICD-10-CM

## 2017-09-09 DIAGNOSIS — I10 Essential (primary) hypertension: Secondary | ICD-10-CM

## 2017-09-09 DIAGNOSIS — E559 Vitamin D deficiency, unspecified: Secondary | ICD-10-CM | POA: Diagnosis not present

## 2017-09-09 DIAGNOSIS — E119 Type 2 diabetes mellitus without complications: Secondary | ICD-10-CM | POA: Diagnosis not present

## 2017-09-09 LAB — POCT GLYCOSYLATED HEMOGLOBIN (HGB A1C)
Est. average glucose Bld gHb Est-mCnc: 128
Hemoglobin A1C: 6.1

## 2017-09-09 MED ORDER — FOSINOPRIL SODIUM 40 MG PO TABS: 40.0000 mg | ORAL_TABLET | Freq: Every day | ORAL | 4 refills | Status: DC

## 2017-09-09 MED ORDER — SAXAGLIPTIN HCL 5 MG PO TABS: 5.0000 mg | ORAL_TABLET | Freq: Every day | ORAL | 4 refills | Status: DC

## 2017-09-09 MED ORDER — METFORMIN HCL 1000 MG PO TABS: 1000.0000 mg | ORAL_TABLET | Freq: Every day | ORAL | 4 refills | Status: DC

## 2017-09-09 MED ORDER — HYDROCHLOROTHIAZIDE 25 MG PO TABS: 25.0000 mg | ORAL_TABLET | Freq: Every day | ORAL | 4 refills | Status: DC

## 2017-09-09 MED ORDER — PIOGLITAZONE HCL 45 MG PO TABS: 45.0000 mg | ORAL_TABLET | Freq: Every day | ORAL | 4 refills | Status: DC

## 2017-09-09 MED ORDER — FLUTICASONE PROPIONATE 50 MCG/ACT NA SUSP
2.0000 | Freq: Every day | NASAL | 3 refills | Status: DC
Start: 2017-09-09 — End: 2019-04-17

## 2017-09-09 NOTE — Progress Notes (Signed)
Patient: Natasha Phillips Female    DOB: 03/24/1951   67 y.o.   MRN: [id number] Visit Date: 09/09/2017  Today's Provider: Mila Merry, MD   Chief Complaint  Patient presents with  . Follow-up  . Diabetes  . Hypertension   Subjective:    HPI   Diabetes Mellitus Type II, Follow-up:   Lab Results  Component Value Date   HGBA1C 5.8 03/11/2017   HGBA1C 6.2 08/30/2016   HGBA1C 6.5 02/27/2016   Last seen for diabetes 6 months ago.  Management since then includes; reduced metformin to 1 tablet qd. She reports good compliance with treatment. She is not having side effects. none Current symptoms include none and have been unchanged. Home blood sugar records: fasting range: 135-140  Episodes of hypoglycemia? no   Current Insulin Regimen: n/a Most Recent Eye Exam: 01/07/2017 Weight trend: stable Prior visit with dietician: no Current diet: well balanced Current exercise: walking  ----------------------------------------------------------------   Hypertension, follow-up:  BP Readings from Last 3 Encounters:  09/09/17 140/80  03/11/17 130/70  01/01/17 138/80    She was last seen for hypertension 6 months ago.  BP at that visit was 130/70. Management since that visit includes; no changes.She reports good compliance with treatment. She is not having side effects. none She is exercising. She is adherent to low salt diet.   Outside blood pressures are not checking. She is experiencing none.  Patient denies none.   Cardiovascular risk factors include diabetes mellitus.  Use of agents associated with hypertension: none .   ----------------------------------------------------------------    Allergies  Allergen Reactions  . Januvia [Sitagliptin] Diarrhea  . Sulfa Antibiotics      Current Outpatient Medications:  .  fosinopril (MONOPRIL) 40 MG tablet, TAKE 1 TABLET (40 MG TOTAL) BY MOUTH DAILY., Disp: 90 tablet, Rfl: 4 .  hydrochlorothiazide (HYDRODIURIL)  25 MG tablet, TAKE 1 TABLET (25 MG TOTAL) BY MOUTH DAILY., Disp: 90 tablet, Rfl: 4 .  meloxicam (MOBIC) 15 MG tablet, Take 1 tablet (15 mg total) by mouth daily. (Patient taking differently: Take 15 mg by mouth as needed. ), Disp: 90 tablet, Rfl: 3 .  metFORMIN (GLUCOPHAGE) 1000 MG tablet, Take 1 tablet (1,000 mg total) by mouth daily., Disp: 3 tablet, Rfl: 1 .  MULTIPLE VITAMINS-CALCIUM PO, Take 1 tablet by mouth daily. , Disp: , Rfl:  .  pioglitazone (ACTOS) 45 MG tablet, TAKE 1 TABLET BY MOUTH EVERY DAY, Disp: 90 tablet, Rfl: 3 .  saxagliptin HCl (ONGLYZA) 5 MG TABS tablet, Take 1 tablet (5 mg total) by mouth daily., Disp: 90 tablet, Rfl: 4  Review of Systems  Constitutional: Negative for appetite change, chills, fatigue and fever.  Respiratory: Negative for chest tightness and shortness of breath.   Cardiovascular: Negative for chest pain and palpitations.  Gastrointestinal: Negative for abdominal pain, nausea and vomiting.  Neurological: Negative for dizziness and weakness.    Social History   Tobacco Use  . Smoking status: Never Smoker  . Smokeless tobacco: Never Used  Substance Use Topics  . Alcohol use: No   Objective:   BP 140/80 (BP Location: Right Arm, Patient Position: Sitting, Cuff Size: Normal)   Pulse 89   Temp 98 F (36.7 C) (Oral)   Resp 16   Ht 5\' 1"  (1.549 m)   Wt 189 lb (85.7 kg)   SpO2 99%   BMI 35.71 kg/m  Vitals:   09/09/17 1125  BP: 140/80  Pulse: 89  Resp: 16  Temp: 98 F (36.7 C)  TempSrc: Oral  SpO2: 99%  Weight: 189 lb (85.7 kg)  Height: 5\' 1"  (1.549 m)     Physical Exam   General Appearance:    Alert, cooperative, no distress  Eyes:    PERRL, conjunctiva/corneas clear, EOM's intact       Lungs:     Clear to auscultation bilaterally, respirations unlabored  Heart:    Regular rate and rhythm  Neurologic:   Awake, alert, oriented x 3. No apparent focal neurological           defect.   ENT:   Nasal turbinates slightly congested with  small amount of clear discharge.    Results for orders placed or performed in visit on 09/09/17  POCT glycosylated hemoglobin (Hb A1C)  Result Value Ref Range   Hemoglobin A1C 6.1    Est. average glucose Bld gHb Est-mCnc 128        Assessment & Plan:     1. Controlled type 2 diabetes mellitus without complication, without long-term current use of insulin (HCC)  - POCT glycosylated hemoglobin (Hb A1C) - metFORMIN (GLUCOPHAGE) 1000 MG tablet; Take 1 tablet (1,000 mg total) by mouth daily.  Dispense: 90 tablet; Refill: 4 - pioglitazone (ACTOS) 45 MG tablet; Take 1 tablet (45 mg total) by mouth daily.  Dispense: 90 tablet; Refill: 4 - saxagliptin HCl (ONGLYZA) 5 MG TABS tablet; Take 1 tablet (5 mg total) by mouth daily.  Dispense: 90 tablet; Refill: 4 - Comprehensive metabolic panel - Lipid panel  2. Essential (primary) hypertension Well controlled.  Continue current medications.   - fosinopril (MONOPRIL) 40 MG tablet; Take 1 tablet (40 mg total) by mouth daily.  Dispense: 90 tablet; Refill: 4 - hydrochlorothiazide (HYDRODIURIL) 25 MG tablet; Take 1 tablet (25 mg total) by mouth daily.  Dispense: 90 tablet; Refill: 4 - Comprehensive metabolic panel  3. Thyroid nodule Due for TSG - TSH  4. Avitaminosis D  - VITAMIN D 25 Hydroxy (Vit-D Deficiency, Fractures)  5. Nasal congestion She reports she started having a little bit of clear discharge and congestion this morning no sinus pain. Has taken fluticasone in the past which was effective and was refilled today.        Mila Merryonald Oline Belk, MD  [facility]

## 2017-09-12 ENCOUNTER — Other Ambulatory Visit: Payer: Self-pay | Admitting: Family Medicine

## 2017-09-12 NOTE — Telephone Encounter (Signed)
[facility] faxed a refill request for a 90-days supply for the following medication. Thanks CC ° °hydrochlorothiazide (HYDRODIURIL) 25 MG tablet  ° °

## 2017-09-12 NOTE — Telephone Encounter (Signed)
Rx has already been sent to pharmacy

## 2017-09-15 ENCOUNTER — Other Ambulatory Visit: Payer: Self-pay | Admitting: Family Medicine

## 2017-09-15 DIAGNOSIS — I10 Essential (primary) hypertension: Secondary | ICD-10-CM

## 2017-09-15 NOTE — Telephone Encounter (Signed)
Please see below.

## 2017-09-15 NOTE — Telephone Encounter (Signed)
Please verify which pharmacy patient is using. I sent refills for all [facility] medication to [facility] [place] last week, but received refill request for hctz from [facility] today.

## 2017-09-17 ENCOUNTER — Telehealth: Payer: Self-pay | Admitting: Family Medicine

## 2017-09-17 DIAGNOSIS — I10 Essential (primary) hypertension: Secondary | ICD-10-CM

## 2017-09-17 MED ORDER — HYDROCHLOROTHIAZIDE 25 MG PO TABS: 25.0000 mg | ORAL_TABLET | Freq: Every day | ORAL | 4 refills | Status: DC

## 2017-09-17 NOTE — Telephone Encounter (Signed)
[facility] faxed refill request for following medications: hydrochlorothiazide (HYDRODIURIL) 25 MG tablet     Please advise,Thanks [place]

## 2017-09-17 NOTE — Telephone Encounter (Signed)
Resent rx to pharmacy. [facility] stated [facility] not [facility] that was sent 09/09/2017.

## 2017-09-22 NOTE — Telephone Encounter (Signed)
Left message to clarify pharmacy  

## 2017-09-22 NOTE — Telephone Encounter (Signed)
Patient reports that due to insurance changes, she has to use [facility] [place].

## 2017-09-24 ENCOUNTER — Encounter: Payer: Self-pay | Admitting: Family Medicine

## 2017-09-24 ENCOUNTER — Ambulatory Visit (INDEPENDENT_AMBULATORY_CARE_PROVIDER_SITE_OTHER): Payer: Medicare Other | Admitting: Family Medicine

## 2017-09-24 VITALS — BP 138/70 | Temp 98.2°F | Resp 16 | Wt 191.0 lb

## 2017-09-24 DIAGNOSIS — J40 Bronchitis, not specified as acute or chronic: Secondary | ICD-10-CM

## 2017-09-24 MED ORDER — DOXYCYCLINE HYCLATE 100 MG PO TABS: 100.0000 mg | ORAL_TABLET | Freq: Two times a day (BID) | ORAL | 0 refills | Status: DC

## 2017-09-24 NOTE — Progress Notes (Signed)
Patient: Natasha Phillips Female    DOB: 03/30/1951   67 y.o.   MRN: [id number] Visit Date: 09/24/2017  Today's Provider: Mila Merryonald Fisher, MD   Chief Complaint  Patient presents with  . Cough   Subjective:    Cough  The current episode started in the past 7 days (about 5 days). The problem has been unchanged. Associated symptoms include headaches, myalgias, nasal congestion and postnasal drip. Pertinent negatives include no chest pain, chills, fever or shortness of breath. Risk factors for lung disease include travel (went to [place] last week). She has tried OTC cough suppressant (also Mucinex and Clartin) for the symptoms. The treatment provided mild relief. There is no history of asthma, COPD or environmental allergies.  Feels heavy in chest. Cough is occasionally productive green phlegm, worse at night.       Allergies  Allergen Reactions  . Januvia [Sitagliptin] Diarrhea  . Sulfa Antibiotics      Current Outpatient Medications:  .  fluticasone (FLONASE) 50 MCG/ACT nasal spray, Place 2 sprays into both nostrils daily., Disp: 16 g, Rfl: 3 .  fosinopril (MONOPRIL) 40 MG tablet, Take 1 tablet (40 mg total) by mouth daily., Disp: 90 tablet, Rfl: 4 .  hydrochlorothiazide (HYDRODIURIL) 25 MG tablet, Take 1 tablet (25 mg total) by mouth daily., Disp: 90 tablet, Rfl: 4 .  meloxicam (MOBIC) 15 MG tablet, Take 1 tablet (15 mg total) by mouth daily. (Patient taking differently: Take 15 mg by mouth as needed. ), Disp: 90 tablet, Rfl: 3 .  metFORMIN (GLUCOPHAGE) 1000 MG tablet, Take 1 tablet (1,000 mg total) by mouth daily., Disp: 90 tablet, Rfl: 4 .  MULTIPLE VITAMINS-CALCIUM PO, Take 1 tablet by mouth daily. , Disp: , Rfl:  .  pioglitazone (ACTOS) 45 MG tablet, Take 1 tablet (45 mg total) by mouth daily., Disp: 90 tablet, Rfl: 4 .  saxagliptin HCl (ONGLYZA) 5 MG TABS tablet, Take 1 tablet (5 mg total) by mouth daily., Disp: 90 tablet, Rfl: 4  Review of Systems  Constitutional:  Negative for appetite change, chills, fatigue and fever.  HENT: Positive for postnasal drip.   Respiratory: Positive for cough. Negative for chest tightness and shortness of breath.   Cardiovascular: Negative for chest pain and palpitations.  Gastrointestinal: Negative for abdominal pain, nausea and vomiting.  Musculoskeletal: Positive for myalgias.  Allergic/Immunologic: Negative for environmental allergies.  Neurological: Positive for headaches. Negative for dizziness and weakness.    Social History   Tobacco Use  . Smoking status: Never Smoker  . Smokeless tobacco: Never Used  Substance Use Topics  . Alcohol use: No   Objective:   BP 138/70 (BP Location: Left Arm, Patient Position: Sitting, Cuff Size: Large)   Temp 98.2 F (36.8 C)   Resp 16   Wt 191 lb (86.6 kg)   BMI 36.09 kg/m  Vitals:   09/24/17 1450  BP: 138/70  Resp: 16  Temp: 98.2 F (36.8 C)  Weight: 191 lb (86.6 kg)     Physical Exam  General Appearance:    Alert, cooperative, no distress  HENT:   bilateral TM normal without fluid or infection, throat normal without erythema or exudate, post nasal drip noted and nasal mucosa congested  Eyes:    PERRL, conjunctiva/corneas clear, EOM's intact       Lungs:     Occasional expiratory wheeze, no rales, , respirations unlabored  Heart:    Regular rate and rhythm  Neurologic:   Awake,  alert, oriented x 3. No apparent focal neurological           defect.           Assessment & Plan:     1. Bronchitis  - doxycycline (VIBRA-TABS) 100 MG tablet; Take 1 tablet (100 mg total) by mouth 2 (two) times daily for 10 days.  Dispense: 20 tablet; Refill: 0  Call if symptoms change or if not rapidly improving.          Mila Merry, MD  [facility]

## 2017-10-07 DIAGNOSIS — E559 Vitamin D deficiency, unspecified: Secondary | ICD-10-CM | POA: Diagnosis not present

## 2017-10-07 DIAGNOSIS — I10 Essential (primary) hypertension: Secondary | ICD-10-CM | POA: Diagnosis not present

## 2017-10-07 DIAGNOSIS — E119 Type 2 diabetes mellitus without complications: Secondary | ICD-10-CM | POA: Diagnosis not present

## 2017-10-07 DIAGNOSIS — E041 Nontoxic single thyroid nodule: Secondary | ICD-10-CM | POA: Diagnosis not present

## 2017-10-08 LAB — LIPID PANEL
CHOL/HDL RATIO: 2.4 ratio (ref 0.0–4.4)
CHOLESTEROL TOTAL: 144 mg/dL (ref 100–199)
HDL: 60 mg/dL (ref >39)
LDL CALC: 77 mg/dL (ref 0–99)
Triglycerides: 37 mg/dL (ref 0–149)
VLDL Cholesterol Cal: 7 mg/dL (ref 5–40)

## 2017-10-08 LAB — VITAMIN D 25 HYDROXY (VIT D DEFICIENCY, FRACTURES): VIT D 25 HYDROXY: 39 ng/mL (ref 30.0–100.0)

## 2017-10-08 LAB — COMPREHENSIVE METABOLIC PANEL
ALBUMIN: 4.2 g/dL (ref 3.6–4.8)
ALT: 12 IU/L (ref 0–32)
AST: 18 IU/L (ref 0–40)
Albumin/Globulin Ratio: 1.4 (ref 1.2–2.2)
Alkaline Phosphatase: 96 IU/L (ref 39–117)
BUN / CREAT RATIO: 18 (ref 12–28)
BUN: 17 mg/dL (ref 8–27)
Bilirubin Total: 0.4 mg/dL (ref 0.0–1.2)
CALCIUM: 10.3 mg/dL (ref 8.7–10.3)
CO2: 28 mmol/L (ref 20–29)
CREATININE: 0.97 mg/dL (ref 0.57–1.00)
Chloride: 102 mmol/L (ref 96–106)
GFR, EST AFRICAN AMERICAN: 71 mL/min/{1.73_m2} (ref >59)
GFR, EST NON AFRICAN AMERICAN: 61 mL/min/{1.73_m2} (ref >59)
GLOBULIN, TOTAL: 3.1 g/dL (ref 1.5–4.5)
Glucose: 104 mg/dL — ABNORMAL HIGH (ref 65–99)
Potassium: 4.2 mmol/L (ref 3.5–5.2)
SODIUM: 142 mmol/L (ref 134–144)
TOTAL PROTEIN: 7.3 g/dL (ref 6.0–8.5)

## 2017-10-08 LAB — TSH: TSH: 0.717 u[IU]/mL (ref 0.450–4.500)

## 2017-11-12 ENCOUNTER — Telehealth: Payer: Self-pay | Admitting: Family Medicine

## 2017-11-12 DIAGNOSIS — J40 Bronchitis, not specified as acute or chronic: Secondary | ICD-10-CM

## 2017-11-12 MED ORDER — DOXYCYCLINE HYCLATE 100 MG PO TABS: 100.0000 mg | ORAL_TABLET | Freq: Two times a day (BID) | ORAL | 0 refills | Status: AC

## 2017-11-12 NOTE — Telephone Encounter (Signed)
Pt had bronchitis about a month ago and wants to know if she could have something sent to the pharmacy.  She is having a little cough again.  [facility] [place]  [place]  Pt's call back is [phone]  Thanks Barth Kirks

## 2017-11-12 NOTE — Telephone Encounter (Signed)
Have sent refill to [facility]. Need ov if any fever or shortness of breath, of if not much better after taking doxycycline.

## 2017-11-12 NOTE — Telephone Encounter (Signed)
Patient was advised.  

## 2017-11-12 NOTE — Telephone Encounter (Signed)
Patient stated cough started 3 days ago. Cough is slightly productive. She is having some tightness in her chest when she coughs. No fever, no shortness of breath or wheezing. Patient is requesting a medication. Please advise?

## 2017-11-26 ENCOUNTER — Encounter: Payer: Self-pay | Admitting: Family Medicine

## 2017-11-26 ENCOUNTER — Ambulatory Visit (INDEPENDENT_AMBULATORY_CARE_PROVIDER_SITE_OTHER): Payer: Medicare Other | Admitting: Family Medicine

## 2017-11-26 VITALS — BP 116/72 | HR 88 | Temp 97.8°F | Resp 16 | Wt 188.0 lb

## 2017-11-26 DIAGNOSIS — R05 Cough: Secondary | ICD-10-CM

## 2017-11-26 DIAGNOSIS — J302 Other seasonal allergic rhinitis: Secondary | ICD-10-CM | POA: Diagnosis not present

## 2017-11-26 DIAGNOSIS — R058 Other specified cough: Secondary | ICD-10-CM

## 2017-11-26 NOTE — Patient Instructions (Signed)

## 2017-11-26 NOTE — Progress Notes (Signed)
Patient: Natasha Phillips Female    DOB: 01-11-1951   67 y.o.   MRN: [id number] Visit Date: 11/26/2017  Today's Provider: Shirlee Latch, MD   I, Joslyn Hy, CMA, am acting as scribe for Shirlee Latch, MD.  Chief Complaint  Patient presents with  . URI   Subjective:    URI   This is a new problem. Progression since onset: improving but lingering. There has been no fever. Associated symptoms include chest pain ("discomfort"' tightness, heaviness), congestion, coughing (slightly productive), ear pain, a plugged ear sensation and rhinorrhea. Pertinent negatives include no abdominal pain, diarrhea, dysuria, headaches, joint pain, nausea, sinus pain, sneezing, sore throat, swollen glands or vomiting. Treatments tried: Dr. Sherrie Mustache prescribed Doxy x 10 days on 11/12/2017. Pt has also tried Archivist. The treatment provided moderate relief.   She describes the chest discomfort as a feeling of something breaking loose when she coughs.  Her symptoms are mostly improved, but the cough with chest discomfort has continued.  This is similar to when she had bronchitis in the past and she wants to make sure she does not have that.    Allergies  Allergen Reactions  . Januvia [Sitagliptin] Diarrhea  . Sulfa Antibiotics      Current Outpatient Medications:  .  fluticasone (FLONASE) 50 MCG/ACT nasal spray, Place 2 sprays into both nostrils daily., Disp: 16 g, Rfl: 3 .  fosinopril (MONOPRIL) 40 MG tablet, Take 1 tablet (40 mg total) by mouth daily., Disp: 90 tablet, Rfl: 4 .  hydrochlorothiazide (HYDRODIURIL) 25 MG tablet, Take 1 tablet (25 mg total) by mouth daily., Disp: 90 tablet, Rfl: 4 .  meloxicam (MOBIC) 15 MG tablet, Take 1 tablet (15 mg total) by mouth daily. (Patient taking differently: Take 15 mg by mouth as needed. ), Disp: 90 tablet, Rfl: 3 .  metFORMIN (GLUCOPHAGE) 1000 MG tablet, Take 1 tablet (1,000 mg total) by mouth daily., Disp: 90 tablet, Rfl: 4 .   MULTIPLE VITAMINS-CALCIUM PO, Take 1 tablet by mouth daily. , Disp: , Rfl:  .  pioglitazone (ACTOS) 45 MG tablet, Take 1 tablet (45 mg total) by mouth daily., Disp: 90 tablet, Rfl: 4 .  saxagliptin HCl (ONGLYZA) 5 MG TABS tablet, Take 1 tablet (5 mg total) by mouth daily., Disp: 90 tablet, Rfl: 4  Review of Systems  Constitutional: Negative.   HENT: Positive for congestion, ear pain and rhinorrhea. Negative for sinus pain, sneezing and sore throat.   Respiratory: Positive for cough (slightly productive).   Cardiovascular: Positive for chest pain ("discomfort"' tightness, heaviness).  Gastrointestinal: Negative for abdominal pain, diarrhea, nausea and vomiting.  Genitourinary: Negative for dysuria.  Musculoskeletal: Negative for joint pain.  Neurological: Negative for headaches.    Social History   Tobacco Use  . Smoking status: Never Smoker  . Smokeless tobacco: Never Used  Substance Use Topics  . Alcohol use: No   Objective:   BP 116/72 (BP Location: Left Arm, Patient Position: Sitting, Cuff Size: Large)   Pulse 88   Temp 97.8 F (36.6 C) (Oral)   Resp 16   Wt 188 lb (85.3 kg)   SpO2 99%   BMI 35.52 kg/m    Physical Exam  Constitutional: She is oriented to person, place, and time. She appears well-developed and well-nourished. No distress.  HENT:  Head: Normocephalic and atraumatic.  Right Ear: Tympanic membrane, external ear and ear canal normal.  Left Ear: Tympanic membrane, external ear and ear canal normal.  Nose: Mucosal edema and rhinorrhea present. Right sinus exhibits no maxillary sinus tenderness and no frontal sinus tenderness. Left sinus exhibits no maxillary sinus tenderness and no frontal sinus tenderness.  Mouth/Throat: Uvula is midline, oropharynx is clear and moist and mucous membranes are normal. No oropharyngeal exudate or posterior oropharyngeal erythema.  Eyes: Pupils are equal, round, and reactive to light. Conjunctivae are normal. Right eye exhibits  no discharge. Left eye exhibits no discharge. No scleral icterus.  Neck: Neck supple. No thyromegaly present.  Cardiovascular: Normal rate, regular rhythm, normal heart sounds and intact distal pulses.  No murmur heard. Pulmonary/Chest: Effort normal and breath sounds normal. No respiratory distress. She has no wheezes. She has no rales.  Musculoskeletal: She exhibits no edema.  Lymphadenopathy:    She has no cervical adenopathy.  Neurological: She is alert and oriented to person, place, and time.  Skin: Skin is warm and dry. Capillary refill takes less than 2 seconds. No rash noted.  Psychiatric: She has a normal mood and affect. Her behavior is normal.       Assessment & Plan:    1. Post-viral cough syndrome - symptoms and exam c/w viral URI, now with post-viral cough - no evidence of strep pharyngitis, CAP, AOM, bacterial sinusitis, or other bacterial infection - discussed symptomatic management, natural course, and return precautions  2. Seasonal allergic rhinitis, unspecified trigger -Given mucosal edema and complaints of eustachian tube dysfunction, suspected that allergic rhinitis could be contributing to symptoms -Continue Flonase  -consider addition of daily antihistamine   Return if symptoms worsen or fail to improve.   The entirety of the information documented in the History of Present Illness, Review of Systems and Physical Exam were personally obtained by me. Portions of this information were initially documented by Irving Burton Ratchford, CMA and reviewed by me for thoroughness and accuracy.    Erasmo Downer, MD, MPH [facility] 11/26/2017 4:38 PM

## 2017-11-28 ENCOUNTER — Ambulatory Visit: Payer: Medicare Other | Admitting: Family Medicine

## 2017-12-03 DIAGNOSIS — H524 Presbyopia: Secondary | ICD-10-CM | POA: Diagnosis not present

## 2017-12-03 DIAGNOSIS — E113293 Type 2 diabetes mellitus with mild nonproliferative diabetic retinopathy without macular edema, bilateral: Secondary | ICD-10-CM | POA: Diagnosis not present

## 2017-12-03 DIAGNOSIS — H52223 Regular astigmatism, bilateral: Secondary | ICD-10-CM | POA: Diagnosis not present

## 2017-12-03 DIAGNOSIS — H43811 Vitreous degeneration, right eye: Secondary | ICD-10-CM | POA: Diagnosis not present

## 2017-12-03 DIAGNOSIS — H5203 Hypermetropia, bilateral: Secondary | ICD-10-CM | POA: Diagnosis not present

## 2017-12-03 DIAGNOSIS — H2513 Age-related nuclear cataract, bilateral: Secondary | ICD-10-CM | POA: Diagnosis not present

## 2017-12-03 DIAGNOSIS — H40053 Ocular hypertension, bilateral: Secondary | ICD-10-CM | POA: Diagnosis not present

## 2017-12-03 DIAGNOSIS — I10 Essential (primary) hypertension: Secondary | ICD-10-CM | POA: Diagnosis not present

## 2017-12-03 LAB — HM DIABETES EYE EXAM

## 2017-12-18 ENCOUNTER — Encounter: Payer: Self-pay | Admitting: *Deleted

## 2018-01-05 ENCOUNTER — Other Ambulatory Visit: Payer: Self-pay | Admitting: Obstetrics and Gynecology

## 2018-01-05 DIAGNOSIS — Z1231 Encounter for screening mammogram for malignant neoplasm of breast: Secondary | ICD-10-CM

## 2018-01-20 ENCOUNTER — Encounter: Payer: Self-pay | Admitting: Obstetrics and Gynecology

## 2018-01-20 ENCOUNTER — Ambulatory Visit (INDEPENDENT_AMBULATORY_CARE_PROVIDER_SITE_OTHER): Payer: Medicare Other | Admitting: Obstetrics and Gynecology

## 2018-01-20 ENCOUNTER — Other Ambulatory Visit (HOSPITAL_COMMUNITY)
Admission: RE | Admit: 2018-01-20 | Discharge: 2018-01-20 | Disposition: A | Discharge status: Home / Self Care | Payer: Medicare Other | Source: Ambulatory Visit | Attending: Obstetrics and Gynecology | Admitting: Obstetrics and Gynecology

## 2018-01-20 VITALS — BP 136/86 | HR 96 | Ht 61.0 in | Wt 190.0 lb

## 2018-01-20 DIAGNOSIS — Z780 Asymptomatic menopausal state: Secondary | ICD-10-CM | POA: Diagnosis not present

## 2018-01-20 DIAGNOSIS — Z113 Encounter for screening for infections with a predominantly sexual mode of transmission: Secondary | ICD-10-CM | POA: Insufficient documentation

## 2018-01-20 DIAGNOSIS — Z01411 Encounter for gynecological examination (general) (routine) with abnormal findings: Secondary | ICD-10-CM | POA: Diagnosis not present

## 2018-01-20 DIAGNOSIS — Z124 Encounter for screening for malignant neoplasm of cervix: Secondary | ICD-10-CM | POA: Diagnosis not present

## 2018-01-20 DIAGNOSIS — Z1231 Encounter for screening mammogram for malignant neoplasm of breast: Secondary | ICD-10-CM | POA: Diagnosis not present

## 2018-01-20 DIAGNOSIS — Z13820 Encounter for screening for osteoporosis: Secondary | ICD-10-CM

## 2018-01-20 DIAGNOSIS — Z1239 Encounter for other screening for malignant neoplasm of breast: Secondary | ICD-10-CM

## 2018-01-20 DIAGNOSIS — Z01419 Encounter for gynecological examination (general) (routine) without abnormal findings: Secondary | ICD-10-CM

## 2018-01-20 NOTE — Patient Instructions (Signed)
[facility] [address] [place] [address]  [facility] [place]  [address] [place] [address]  Phone: [phone]  

## 2018-01-20 NOTE — Progress Notes (Signed)
Gynecology Annual Exam  PCP: Malva Limes, MD  Chief Complaint:  Chief Complaint  Patient presents with  . Gynecologic Exam    History of Present Illness:Patient is a 67 y.o. G2P2 presents for annual exam. The patient has no complaints today.   LMP: No LMP recorded. Patient is postmenopausal. Postmenopausal no bleeding concerns.  The patient is sexually active. She denies dyspareunia.  One partner not frequently.  The patient does perform self breast exams.  There is no notable family history of breast or ovarian cancer in her family.  The patient wears seatbelts: yes.   The patient has regular exercise: not asked.    The patient denies current symptoms of depression.     Review of Systems: Review of Systems  Constitutional: Negative for chills and fever.  HENT: Negative for congestion.   Respiratory: Negative for cough and shortness of breath.   Cardiovascular: Negative for chest pain and palpitations.  Gastrointestinal: Negative for abdominal pain, constipation, diarrhea, heartburn, nausea and vomiting.  Genitourinary: Negative for dysuria, frequency and urgency.  Skin: Negative for itching and rash.  Neurological: Negative for dizziness and headaches.  Endo/Heme/Allergies: Negative for polydipsia.  Psychiatric/Behavioral: Negative for depression.    Past Medical History:  Past Medical History:  Diagnosis Date  . Anemia   . Diabetes mellitus without complication (HCC)   . Hypertension     Past Surgical History:  Past Surgical History:  Procedure Laterality Date  . BIOPSY THYROID    . CESAREAN SECTION     1981 1988  . TUBAL LIGATION      Gynecologic History:  No LMP recorded. Patient is postmenopausal. Last Pap: Results were: 02/12/18 NIL and HR HPV negative  Last mammogram: 02/11/2017 Results were: BI-RAD I  Obstetric History: G2P2  Family History:  Family History  Problem Relation Age of Onset  . Diabetes Mother   . Heart failure Mother   .  Diabetes Sister   . Healthy Sister   . Healthy Sister   . Healthy Sister   . Diabetes Brother   . Diabetes Sister   . Heart failure Father   . Brain cancer Brother   . Throat cancer Brother   . Glaucoma Other        Runs on Paternal Side  . Breast cancer Cousin     Social History:  Social History   Socioeconomic History  . Marital status: Widowed    Spouse name: Not on file  . Number of children: 2  . Years of education: College  . Highest education level: Not on file  Occupational History  . 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 Financial resource strain: Not on file  . Food insecurity:    Worry: Not on file    Inability: Not on file  . Transportation needs:    Medical: Not on file    Non-medical: Not on file  Tobacco Use  . Smoking status: Never Smoker  . Smokeless tobacco: Never Used  Substance and Sexual Activity  . Alcohol use: No  . Drug use: No  . Sexual activity: Not Currently    Birth control/protection: None  Lifestyle  . Physical activity:    Days per week: 5 days    Minutes per session: 20 min  . Stress: Not on file  Relationships  . Social connections:    Talks on phone: Not on file    Gets together: Not on  file    Attends religious service: Not on file    Active member of club or organization: Not on file    Attends meetings of clubs or organizations: Not on file    Relationship status: Not on file  . Intimate partner violence:    Fear of current or ex partner: Not on file    Emotionally abused: Not on file    Physically abused: Not on file    Forced sexual activity: Not on file  Other Topics Concern  . Not on file  Social History Narrative  . Not on file    Allergies:  Allergies  Allergen Reactions  . Januvia [Sitagliptin] Diarrhea  . Sulfa Antibiotics     Medications: Prior to Admission medications   Medication Sig Start Date End Date Taking? Authorizing Provider  fluticasone  (FLONASE) 50 MCG/ACT nasal spray Place 2 sprays into both nostrils daily. 09/09/17  Yes Malva Limes, MD  fosinopril (MONOPRIL) 40 MG tablet Take 1 tablet (40 mg total) by mouth daily. 09/09/17  Yes Malva Limes, MD  hydrochlorothiazide (HYDRODIURIL) 25 MG tablet Take 1 tablet (25 mg total) by mouth daily. 09/17/17  Yes Malva Limes, MD  meloxicam (MOBIC) 15 MG tablet Take 1 tablet (15 mg total) by mouth daily. Patient taking differently: Take 15 mg by mouth as needed.  06/22/15  Yes Lorie Phenix, MD  metFORMIN (GLUCOPHAGE) 1000 MG tablet Take 1 tablet (1,000 mg total) by mouth daily. 09/09/17  Yes Malva Limes, MD  MULTIPLE VITAMINS-CALCIUM PO Take 1 tablet by mouth daily.    Yes [provider]  pioglitazone (ACTOS) 45 MG tablet Take 1 tablet (45 mg total) by mouth daily. 09/09/17  Yes Malva Limes, MD  saxagliptin HCl (ONGLYZA) 5 MG TABS tablet Take 1 tablet (5 mg total) by mouth daily. 09/09/17  Yes Malva Limes, MD    Physical Exam Vitals: Blood pressure 136/86, pulse 96, height 5\' 1"  (1.549 m), weight 190 lb (86.2 kg).  General: NAD HEENT: normocephalic, anicteric Thyroid: no enlargement, no palpable nodules Pulmonary: No increased work of breathing, CTAB Cardiovascular: RRR, distal pulses 2+ Breast: Breast symmetrical, no tenderness, no palpable nodules or masses, no skin or nipple retraction present, no nipple discharge.  No axillary or supraclavicular lymphadenopathy. Abdomen: NABS, soft, non-tender, non-distended.  Umbilicus without lesions.  No hepatomegaly, splenomegaly or masses palpable. No evidence of hernia  Genitourinary:  External: Normal external female genitalia.  Normal urethral meatus, normal Bartholin's and Skene's glands.    Vagina: Normal vaginal mucosa, no evidence of prolapse.    Cervix: Grossly normal in appearance, no bleeding  Uterus: Non-enlarged, mobile, normal contour.  No CMT  Adnexa: ovaries non-enlarged, no adnexal masses  Rectal:  deferred  Lymphatic: no evidence of inguinal lymphadenopathy Extremities: no edema, erythema, or tenderness Neurologic: Grossly intact Psychiatric: mood appropriate, affect full  Female chaperone present for pelvic and breast  portions of the physical exam     Assessment: 67 y.o. G2P2 routine annual exam  Plan: Problem List Items Addressed This Visit    None    Visit Diagnoses    Breast screening    -  Primary   Relevant Orders   MM DIGITAL SCREENING BILATERAL   Screening for malignant neoplasm of cervix       Relevant Orders   Cytology - PAP   Encounter for gynecological examination without abnormal finding       Postmenopausal estrogen deficiency  Relevant Orders   DG Bone Density   Osteoporosis screening       Relevant Orders   DG Bone Density   Screening examination for STD (sexually transmitted disease)       Relevant Orders   Cytology - PAP   HEP, RPR, HIV Panel      1) Mammogram - recommend yearly screening mammogram.  Mammogram Was ordered today  2) STI screening  wasoffered and accepted  3) ASCCP guidelines and rational discussed.  Patient opts for yearly screening interval - would like to stay on yearly pap even after 67  4) Osteoporosis  - per USPTF routine screening DEXA at age 65 - ordered  5) Routine healthcare maintenance including cholesterol, diabetes screening discussed managed by PCP  6) Colonoscopy 09/30/2011 normal  7) Return in about 1 year (around 01/21/2019) for annual.    Vena AustriaAndreas Sedrick Tober, MD [facility]   01/20/2018, 2:56 PM

## 2018-01-21 DIAGNOSIS — I10 Essential (primary) hypertension: Secondary | ICD-10-CM | POA: Diagnosis not present

## 2018-01-21 DIAGNOSIS — R011 Cardiac murmur, unspecified: Secondary | ICD-10-CM | POA: Diagnosis not present

## 2018-01-21 DIAGNOSIS — M1990 Unspecified osteoarthritis, unspecified site: Secondary | ICD-10-CM | POA: Diagnosis not present

## 2018-01-21 DIAGNOSIS — E119 Type 2 diabetes mellitus without complications: Secondary | ICD-10-CM | POA: Diagnosis not present

## 2018-01-21 DIAGNOSIS — R0602 Shortness of breath: Secondary | ICD-10-CM | POA: Diagnosis not present

## 2018-01-21 DIAGNOSIS — E669 Obesity, unspecified: Secondary | ICD-10-CM | POA: Diagnosis not present

## 2018-01-21 LAB — HEP, RPR, HIV PANEL
HIV SCREEN 4TH GENERATION: NONREACTIVE
Hepatitis B Surface Ag: NEGATIVE
RPR: NONREACTIVE

## 2018-01-22 LAB — CYTOLOGY - PAP
CHLAMYDIA, DNA PROBE: NEGATIVE
Diagnosis: NEGATIVE
HPV: NOT DETECTED
NEISSERIA GONORRHEA: NEGATIVE

## 2018-02-12 ENCOUNTER — Ambulatory Visit
Admission: RE | Admit: 2018-02-12 | Discharge: 2018-02-12 | Disposition: A | Discharge status: Home / Self Care | Payer: Medicare Other | Source: Ambulatory Visit | Attending: Obstetrics and Gynecology | Admitting: Obstetrics and Gynecology

## 2018-02-12 DIAGNOSIS — Z1231 Encounter for screening mammogram for malignant neoplasm of breast: Secondary | ICD-10-CM | POA: Insufficient documentation

## 2018-03-11 NOTE — Progress Notes (Signed)
Patient: Natasha Phillips Female    DOB: 1950/09/10   67 y.o.   MRN: [id number] Visit Date: 03/12/2018  Today's Provider: Mila Merry, MD   Chief Complaint  Patient presents with  . Diabetes  . Hypertension   Subjective:    HPI   Diabetes Mellitus Type II, Follow-up:   Lab Results  Component Value Date   HGBA1C 6.1 09/09/2017   HGBA1C 5.8 03/11/2017   HGBA1C 6.2 08/30/2016   Last seen for diabetes 6 months ago.  Management since then includes; no changes. She reports good compliance with treatment. She is not having side effects.  Current symptoms include none and have been stable. Home blood sugar records: 135-140 random  Episodes of hypoglycemia? no   Current Insulin Regimen: none Most Recent Eye Exam: 4 months ago Weight trend: fluctuating a bit Prior visit with dietician: no Current diet: well balanced Current exercise: cardiovascular workout on exercise equipment  ------------------------------------------------------------------------   Hypertension, follow-up:  BP Readings from Last 3 Encounters:  03/12/18 122/82  01/20/18 136/86  11/26/17 116/72    She was last seen for hypertension 6 months ago.  BP at that visit was 140/80. Management since that visit includes; no changes.She reports good compliance with treatment. She is not having side effects.  She is exercising. She is adherent to low salt diet.   Outside blood pressures are not being checked. She is experiencing none.  Patient denies chest pain, chest pressure/discomfort, claudication, dyspnea, exertional chest pressure/discomfort, fatigue, irregular heart beat, lower extremity edema, near-syncope, orthopnea, palpitations, paroxysmal nocturnal dyspnea, syncope and tachypnea.   Cardiovascular risk factors include advanced age (older than 50 for men, 16 for women), diabetes mellitus and hypertension.  Use of agents associated with hypertension: none.    ------------------------------------------------------------------------    Avitaminosis D From 09/09/2017-labs checked, no changes. Patient is currently taking a daily multivitamin.  Lab Results  Component Value Date   VD25OH 39.0 10/07/2017     Allergies  Allergen Reactions  . Januvia [Sitagliptin] Diarrhea  . Sulfa Antibiotics      Current Outpatient Medications:  .  fluticasone (FLONASE) 50 MCG/ACT nasal spray, Place 2 sprays into both nostrils daily., Disp: 16 g, Rfl: 3 .  fosinopril (MONOPRIL) 40 MG tablet, Take 1 tablet (40 mg total) by mouth daily., Disp: 90 tablet, Rfl: 4 .  hydrochlorothiazide (HYDRODIURIL) 25 MG tablet, Take 1 tablet (25 mg total) by mouth daily., Disp: 90 tablet, Rfl: 4 .  meloxicam (MOBIC) 15 MG tablet, Take 1 tablet (15 mg total) by mouth daily. (Patient taking differently: Take 15 mg by mouth as needed. ), Disp: 90 tablet, Rfl: 3 .  metFORMIN (GLUCOPHAGE) 1000 MG tablet, Take 1 tablet (1,000 mg total) by mouth daily., Disp: 90 tablet, Rfl: 4 .  MULTIPLE VITAMINS-CALCIUM PO, Take 1 tablet by mouth daily. , Disp: , Rfl:  .  pioglitazone (ACTOS) 45 MG tablet, Take 1 tablet (45 mg total) by mouth daily., Disp: 90 tablet, Rfl: 4 .  saxagliptin HCl (ONGLYZA) 5 MG TABS tablet, Take 1 tablet (5 mg total) by mouth daily., Disp: 90 tablet, Rfl: 4  Review of Systems  Constitutional: Negative for appetite change, chills, fatigue and fever.  Respiratory: Negative for chest tightness and shortness of breath.   Cardiovascular: Negative for chest pain and palpitations.  Gastrointestinal: Negative for abdominal pain, nausea and vomiting.  Neurological: Positive for numbness (occasional numbness in her hands). Negative for dizziness and weakness.    Social History  Tobacco Use  . Smoking status: Never Smoker  . Smokeless tobacco: Never Used  Substance Use Topics  . Alcohol use: No   Objective:   BP 122/82 (BP Location: Left Arm, Patient Position:  Sitting, Cuff Size: Large)   Pulse 79   Temp 98.4 F (36.9 C) (Oral)   Resp 16   Wt 187 lb (84.8 kg)   SpO2 98% Comment: room air  BMI 35.33 kg/m  Vitals:   03/12/18 1044  BP: 122/82  Pulse: 79  Resp: 16  Temp: 98.4 F (36.9 C)  TempSrc: Oral  SpO2: 98%  Weight: 187 lb (84.8 kg)     Physical Exam General appearance: alert, well developed, well nourished, cooperative and in no distress Head: Normocephalic, without obvious abnormality, atraumatic Respiratory: Respirations even and unlabored, normal respiratory rate Extremities: No gross deformities Skin: Skin color, texture, turgor normal. No rashes seen  Psych: Appropriate mood and affect. Neurologic: Mental status: Alert, oriented to person, place, and time, thought content appropriate..   Results for orders placed or performed in visit on 03/12/18  POCT HgB A1C  Result Value Ref Range   Hemoglobin A1C 6.4 (A) 4.0 - 5.6 %   HbA1c POC (<> result, manual entry)     HbA1c, POC (prediabetic range)     HbA1c, POC (controlled diabetic range)     Est. average glucose Bld gHb Est-mCnc 137   POCT UA - Microalbumin  Result Value Ref Range   Microalbumin Ur, POC 20 mg/L   Creatinine, POC n/a mg/dL   Albumin/Creatinine Ratio, Urine, POC n/a         Assessment & Plan:     1. Controlled type 2 diabetes mellitus without complication, without long-term current use of insulin (HCC) Well controlled.  Continue current medications.   - POCT HgB A1C - POCT UA - Microalbumin  2. Essential (primary) hypertension Well controlled.  Continue current medications.     - EKG 12-Lead  3. Need for pneumococcal vaccination  - Pneumococcal conjugate vaccine 13-valent IM  4. Need for influenza vaccination Patient refused vaccine today.        Mila Merry, MD  [facility]

## 2018-03-12 ENCOUNTER — Encounter: Payer: Self-pay | Admitting: Family Medicine

## 2018-03-12 ENCOUNTER — Ambulatory Visit (INDEPENDENT_AMBULATORY_CARE_PROVIDER_SITE_OTHER): Payer: Medicare Other | Admitting: Family Medicine

## 2018-03-12 VITALS — BP 122/82 | HR 79 | Temp 98.4°F | Resp 16 | Wt 187.0 lb

## 2018-03-12 DIAGNOSIS — I10 Essential (primary) hypertension: Secondary | ICD-10-CM

## 2018-03-12 DIAGNOSIS — E119 Type 2 diabetes mellitus without complications: Secondary | ICD-10-CM | POA: Diagnosis not present

## 2018-03-12 DIAGNOSIS — Z23 Encounter for immunization: Secondary | ICD-10-CM

## 2018-03-12 LAB — POCT GLYCOSYLATED HEMOGLOBIN (HGB A1C)
ESTIMATED AVERAGE GLUCOSE: 137
Hemoglobin A1C: 6.4 % — AB (ref 4.0–5.6)

## 2018-03-12 LAB — POCT UA - MICROALBUMIN: MICROALBUMIN (UR) POC: 20 mg/L

## 2018-03-12 NOTE — Patient Instructions (Addendum)
   The CDC recommends two doses of Shingrix (the shingles vaccine) separated by 2 to 6 months for adults age 65 years and older. I recommend checking with your pharmacy plan regarding coverage for this vaccine.   . I recommend that you get a flu vaccine this year. Please call our office at [phone] at your earliest convenience to schedule a flu shot.

## 2018-04-09 ENCOUNTER — Ambulatory Visit: Payer: Medicare Other | Admitting: Family Medicine

## 2018-04-27 ENCOUNTER — Ambulatory Visit (INDEPENDENT_AMBULATORY_CARE_PROVIDER_SITE_OTHER): Payer: Medicare Other | Admitting: Family Medicine

## 2018-04-27 ENCOUNTER — Encounter: Payer: Self-pay | Admitting: Family Medicine

## 2018-04-27 VITALS — BP 122/70 | HR 82 | Temp 98.8°F | Resp 16 | Wt 187.0 lb

## 2018-04-27 DIAGNOSIS — Z0000 Encounter for general adult medical examination without abnormal findings: Secondary | ICD-10-CM | POA: Diagnosis not present

## 2018-04-27 NOTE — Patient Instructions (Addendum)
   The CDC recommends two doses of Shingrix (the shingles vaccine) separated by 2 to 6 months for adults age 65 years and older. I recommend checking with your pharmacy plan regarding coverage for this vaccine.   . I recommend that you get a flu vaccine this year. Please call our office at [phone] at your earliest convenience to schedule a flu shot.     You are due for a Bone Density Test. We can schedule this for you when you come back in the Spring

## 2018-04-27 NOTE — Progress Notes (Signed)
Patient: Natasha Phillips, Female    DOB: October 28, 1950, 67 y.o.   MRN: [id number] Visit Date: 04/27/2018  Today's Provider: Mila Merry, MD   Chief Complaint  Patient presents with  . Medicare Wellness   Subjective:   Initial preventative physical exam Natasha Phillips is a 67 y.o. female who presents today for her Initial Preventative Physical Exam. She feels fairly well. She reports exercising 3-4 times each week. She reports she is sleeping fairly well.  Last office visit was 03/12/2018 (seen for follow up of chronic problems, including Diabetes and Hypertension)- no changes were made.  Review of Systems  Constitutional: Negative for chills, fatigue and fever.  HENT: Negative for congestion, ear pain, rhinorrhea, sneezing and sore throat.   Eyes: Negative.  Negative for pain and redness.  Respiratory: Negative for cough, shortness of breath and wheezing.   Cardiovascular: Negative for chest pain and leg swelling.  Gastrointestinal: Negative for abdominal pain, blood in stool, constipation, diarrhea and nausea.  Endocrine: Negative for polydipsia and polyphagia.  Genitourinary: Negative.  Negative for dysuria, flank pain, hematuria, pelvic pain, vaginal bleeding and vaginal discharge.  Musculoskeletal: Negative for arthralgias, back pain, gait problem and joint swelling.  Skin: Negative for rash.  Neurological: Negative.  Negative for dizziness, tremors, seizures, weakness, light-headedness, numbness and headaches.  Hematological: Negative for adenopathy.  Psychiatric/Behavioral: Negative.  Negative for behavioral problems, confusion and dysphoric mood. The patient is not nervous/anxious and is not hyperactive.     Social History   Socioeconomic History  . Marital status: Widowed    Spouse name: Not on file  . Number of children: 2  . Years of education: College  . Highest education level: Not on file  Occupational History  . Occupation: Retired 2014    Comment:  Previously Sports coach [employer]  Social Needs  . Financial resource strain: Not on file  . Food insecurity:    Worry: Not on file    Inability: Not on file  . Transportation needs:    Medical: Not on file    Non-medical: Not on file  Tobacco Use  . Smoking status: Never Smoker  . Smokeless tobacco: Never Used  Substance and Sexual Activity  . Alcohol use: No  . Drug use: No  . Sexual activity: Not Currently    Birth control/protection: None  Lifestyle  . Physical activity:    Days per week: 5 days    Minutes per session: 20 min  . Stress: Not on file  Relationships  . Social connections:    Talks on phone: Not on file    Gets together: Not on file    Attends religious service: Not on file    Active member of club or organization: Not on file    Attends meetings of clubs or organizations: Not on file    Relationship status: Not on file  . Intimate partner violence:    Fear of current or ex partner: Not on file    Emotionally abused: Not on file    Physically abused: Not on file    Forced sexual activity: Not on file  Other Topics Concern  . Not on file  Social History Narrative  . Not on file    Past Medical History:  Diagnosis Date  . Anemia   . Diabetes mellitus without complication (HCC)   . Hypertension      Patient Active Problem List   Diagnosis Date Noted  . Erosion of oral mucosa  10/28/2016  . Thyroid nodule 10/14/2015  . Abnormal cervical Papanicolaou smear 01/11/2015  . Abnormal loss of weight 01/11/2015  . Absolute anemia 01/11/2015  . Cardiac murmur 01/11/2015  . CN (constipation) 01/11/2015  . Calcium blood increased 01/11/2015  . LBP (low back pain) 01/11/2015  . Adiposity 01/11/2015  . Leg paresthesia 01/11/2015  . Neuralgia neuritis, sciatic nerve 01/11/2015  . Avitaminosis D 01/11/2015  . Essential (primary) hypertension 11/22/2014  . Controlled type 2 diabetes mellitus without complication (HCC) 11/22/2014    Past Surgical History:    Procedure Laterality Date  . BIOPSY THYROID    . CESAREAN SECTION     1981 1988  . TUBAL LIGATION      Her family history includes Brain cancer in her brother; Breast cancer in her cousin; Diabetes in her brother, mother, sister, and sister; Glaucoma in her other; Healthy in her sister, sister, and sister; Heart failure in her father and mother; Throat cancer in her brother.      Current Outpatient Medications:  .  fluticasone (FLONASE) 50 MCG/ACT nasal spray, Place 2 sprays into both nostrils daily., Disp: 16 g, Rfl: 3 .  fosinopril (MONOPRIL) 40 MG tablet, Take 1 tablet (40 mg total) by mouth daily., Disp: 90 tablet, Rfl: 4 .  hydrochlorothiazide (HYDRODIURIL) 25 MG tablet, Take 1 tablet (25 mg total) by mouth daily., Disp: 90 tablet, Rfl: 4 .  meloxicam (MOBIC) 15 MG tablet, Take 1 tablet (15 mg total) by mouth daily. (Patient taking differently: Take 15 mg by mouth as needed. ), Disp: 90 tablet, Rfl: 3 .  metFORMIN (GLUCOPHAGE) 1000 MG tablet, Take 1 tablet (1,000 mg total) by mouth daily., Disp: 90 tablet, Rfl: 4 .  pioglitazone (ACTOS) 45 MG tablet, Take 1 tablet (45 mg total) by mouth daily., Disp: 90 tablet, Rfl: 4 .  saxagliptin HCl (ONGLYZA) 5 MG TABS tablet, Take 1 tablet (5 mg total) by mouth daily., Disp: 90 tablet, Rfl: 4 .  MULTIPLE VITAMINS-CALCIUM PO, Take 1 tablet by mouth daily. , Disp: , Rfl:    Patient Care Team: Malva Limes, MD as PCP - General (Family Medicine)      Objective:   Vitals: BP 122/70 (BP Location: Left Arm, Patient Position: Sitting, Cuff Size: Large)   Pulse 82   Temp 98.8 F (37.1 C) (Oral)   Resp 16   Wt 187 lb (84.8 kg)   SpO2 99% Comment: room air  BMI 35.33 kg/m   Physical Exam    Visual Acuity Screening   Right eye Left eye Both eyes  Without correction: 20/30 20/30 20/25   With correction: 20/20 20/25 20/20   Comments: Patient saw all colors   Activities of Daily Living In your present state of health, do you have any  difficulty performing the following activities: 04/27/2018 09/24/2017  Hearing? N N  Vision? N N  Difficulty concentrating or making decisions? N N  Walking or climbing stairs? N N  Dressing or bathing? N N  Doing errands, shopping? N N  Some recent data might be hidden    Fall Risk Assessment Fall Risk  04/27/2018 03/12/2018 06/22/2015  Falls in the past year? No No Yes  Number falls in past yr: - - 1  Injury with Fall? - - No     Depression Screen PHQ 2/9 Scores 04/27/2018 03/12/2018 11/07/2016 06/22/2015  PHQ - 2 Score 0 0 0 0  PHQ- 9 Score 0 0 0 -    Cognitive Testing - 6-CIT  Correct?  Score   What year is it? yes 0 0 or 4  What month is it? yes 0 0 or 3  Memorize:    Floyde Parkins,  44,  [address]  [place],      What time is it? (within 1 hour) yes 0 0 or 3  Count backwards from 20 yes 0 0, 2, or 4  Name the months of the year yes 0 0, 2, or 4  Repeat name & address above yes 0 0, 2, 4, 6, 8, or 10       TOTAL SCORE  0/28   Interpretation:  Normal  Normal (0-7) Abnormal (8-28)      Assessment & Plan:     Initial Preventative Physical Exam  Reviewed patient's Family Medical History Reviewed and updated list of patient's medical providers Assessment of cognitive impairment was done Assessed patient's functional ability Established a written schedule for health screening services Health Risk Assessent Completed and Reviewed  Exercise Activities and Dietary recommendations Goals    . HEMOGLOBIN A1C < 7.0    . Peak Blood Glucose < 180       Immunization History  Administered Date(s) Administered  . Hepatitis B 09/20/2011, 11/01/2011, 04/03/2012  . Pneumococcal Conjugate-13 03/12/2018  . Pneumococcal Polysaccharide-23 05/19/2013  . Tdap 02/07/2012    Health Maintenance  Topic Date Due  . FOOT EXAM  10/12/2016  . DEXA SCAN  09/12/2017  . INFLUENZA VACCINE  10/30/2018 (Originally 02/05/2018)  . HEMOGLOBIN A1C  09/10/2018  . PNA vac Low Risk Adult (2 of 2 -  PPSV23) 03/13/2019  . MAMMOGRAM  02/13/2020  . PAP SMEAR  01/20/2021  . COLONOSCOPY  09/29/2021  . TETANUS/TDAP  02/06/2022  . Hepatitis C Screening  Completed  . HIV Screening  Completed     Discussed health benefits of physical activity, and encouraged her to engage in regular exercise appropriate for her age and condition.    ------------------------------------------------------------------------------------------------------------    Mila Merry, MD  [facility]

## 2018-07-31 DIAGNOSIS — H2513 Age-related nuclear cataract, bilateral: Secondary | ICD-10-CM | POA: Diagnosis not present

## 2018-07-31 DIAGNOSIS — E113393 Type 2 diabetes mellitus with moderate nonproliferative diabetic retinopathy without macular edema, bilateral: Secondary | ICD-10-CM | POA: Diagnosis not present

## 2018-07-31 LAB — HM DIABETES EYE EXAM

## 2018-08-24 ENCOUNTER — Ambulatory Visit (INDEPENDENT_AMBULATORY_CARE_PROVIDER_SITE_OTHER): Payer: Medicare Other | Admitting: Family Medicine

## 2018-08-24 ENCOUNTER — Encounter: Payer: Self-pay | Admitting: Family Medicine

## 2018-08-24 VITALS — BP 112/60 | HR 80 | Temp 97.9°F | Resp 16 | Wt 186.0 lb

## 2018-08-24 DIAGNOSIS — K219 Gastro-esophageal reflux disease without esophagitis: Secondary | ICD-10-CM | POA: Diagnosis not present

## 2018-08-24 MED ORDER — OMEPRAZOLE 40 MG PO CPDR: 40.0000 mg | DELAYED_RELEASE_CAPSULE | Freq: Every day | ORAL | 3 refills | Status: DC

## 2018-08-24 NOTE — Progress Notes (Signed)
Patient: Natasha Phillips Female    DOB: 1950-11-14   68 y.o.   MRN: [id number] Visit Date: 08/24/2018  Today's Provider: Mila Merry, MD   Chief Complaint  Patient presents with  . Gastroesophageal Reflux   Subjective:     She complains of early satiety (Slightly) and heartburn. She reports no abdominal pain, no coughing, no dysphagia, no hoarse voice, no nausea or no sore throat. This is a new problem. The current episode started 1 to 4 weeks ago (Started about two weeks ago.). The problem occurs frequently. The problem has been gradually worsening. The heartburn does not wake her from sleep. The heartburn does not limit her activity. The heartburn doesn't change with position. The symptoms are aggravated by certain foods. Pertinent negatives include no fatigue or weight loss. She has tried an antacid [facility] famotidine as neededI for the symptoms. The treatment provided mild relief.     Allergies  Allergen Reactions  . Januvia [Sitagliptin] Diarrhea  . Sulfa Antibiotics      Current Outpatient Medications:  .  fluticasone (FLONASE) 50 MCG/ACT nasal spray, Place 2 sprays into both nostrils daily., Disp: 16 g, Rfl: 3 .  fosinopril (MONOPRIL) 40 MG tablet, Take 1 tablet (40 mg total) by mouth daily., Disp: 90 tablet, Rfl: 4 .  hydrochlorothiazide (HYDRODIURIL) 25 MG tablet, Take 1 tablet (25 mg total) by mouth daily., Disp: 90 tablet, Rfl: 4 .  meloxicam (MOBIC) 15 MG tablet, Take 1 tablet (15 mg total) by mouth daily. (Patient taking differently: Take 15 mg by mouth as needed. ), Disp: 90 tablet, Rfl: 3 .  metFORMIN (GLUCOPHAGE) 1000 MG tablet, Take 1 tablet (1,000 mg total) by mouth daily., Disp: 90 tablet, Rfl: 4 .  MULTIPLE VITAMINS-CALCIUM PO, Take 1 tablet by mouth daily. , Disp: , Rfl:  .  pioglitazone (ACTOS) 45 MG tablet, Take 1 tablet (45 mg total) by mouth daily., Disp: 90 tablet, Rfl: 4 .  saxagliptin HCl (ONGLYZA) 5 MG TABS tablet, Take 1 tablet (5 mg total) by  mouth daily., Disp: 90 tablet, Rfl: 4  Review of Systems  Constitutional: Negative.  Negative for fatigue and weight loss.  HENT: Negative for hoarse voice and sore throat.   Respiratory: Negative.  Negative for cough.   Gastrointestinal: Positive for heartburn. Negative for abdominal distention, abdominal pain, anal bleeding, blood in stool, constipation, diarrhea, dysphagia, nausea, rectal pain and vomiting.       Heartburn     Social History   Tobacco Use  . Smoking status: Never Smoker  . Smokeless tobacco: Never Used  Substance Use Topics  . Alcohol use: No      Objective:   BP 112/60 (BP Location: Left Arm, Patient Position: Sitting, Cuff Size: Large)   Pulse 80   Temp 97.9 F (36.6 C) (Oral)   Resp 16   Wt 186 lb (84.4 kg)   BMI 35.14 kg/m    Physical Exam  General Appearance:    Alert, cooperative, no distress  Eyes:    PERRL, conjunctiva/corneas clear, EOM's intact       Lungs:     Clear to auscultation bilaterally, respirations unlabored  Heart:    Regular rate and rhythm  Abdomen:   bowel sounds present and normal in all 4 quadrants, soft, round, nontender or nondistended. No CVA tenderness       Assessment & Plan    1. Gastroesophageal reflux disease, esophagitis presence not specified Discussed reflux precautions. start- omeprazole (  PRILOSEC) 40 MG capsule; Take 1 capsule (40 mg total) by mouth daily.  Dispense: 30 capsule; Refill: 3  Follow up in March as scheduled.     Mila Merry, MD  [facility]

## 2018-08-24 NOTE — Patient Instructions (Signed)
. Please review the attached list of medications and notify my office if there are any errors.   . Please bring all of your medications to every appointment so we can make sure that our medication list is the same as yours.    Gastroesophageal Reflux Disease, Adult Gastroesophageal reflux (GER) happens when acid from the stomach flows up into the tube that connects the mouth and the stomach (esophagus). Normally, food travels down the esophagus and stays in the stomach to be digested. With GER, food and stomach acid sometimes move back up into the esophagus. You may have a disease called gastroesophageal reflux disease (GERD) if the reflux:  Happens often.  Causes frequent or very bad symptoms.  Causes problems such as damage to the esophagus. When this happens, the esophagus becomes sore and swollen (inflamed). Over time, GERD can make small holes (ulcers) in the lining of the esophagus. What are the causes? This condition is caused by a problem with the muscle between the esophagus and the stomach. When this muscle is weak or not normal, it does not close properly to keep food and acid from coming back up from the stomach. The muscle can be weak because of:  Tobacco use.  Pregnancy.  Having a certain type of hernia (hiatal hernia).  Alcohol use.  Certain foods and drinks, such as coffee, chocolate, onions, and peppermint. What increases the risk? You are more likely to develop this condition if you:  Are overweight.  Have a disease that affects your connective tissue.  Use NSAID medicines. What are the signs or symptoms? Symptoms of this condition include:  Heartburn.  Difficult or painful swallowing.  The feeling of having a lump in the throat.  A bitter taste in the mouth.  Bad breath.  Having a lot of saliva.  Having an upset or bloated stomach.  Belching.  Chest pain. Different conditions can cause chest pain. Make sure you see your doctor if you have chest  pain.  Shortness of breath or noisy breathing (wheezing).  Ongoing (chronic) cough or a cough at night.  Wearing away of the surface of teeth (tooth enamel).  Weight loss. How is this treated? Treatment will depend on how bad your symptoms are. Your doctor may suggest:  Changes to your diet.  Medicine.  Surgery. Follow these instructions at home: Eating and drinking   Follow a diet as told by your doctor. You may need to avoid foods and drinks such as: ? Coffee and tea (with or without caffeine). ? Drinks that contain alcohol. ? Energy drinks and sports drinks. ? Bubbly (carbonated) drinks or sodas. ? Chocolate and cocoa. ? Peppermint and mint flavorings. ? Garlic and onions. ? Horseradish. ? Spicy and acidic foods. These include peppers, chili powder, curry powder, vinegar, hot sauces, and BBQ sauce. ? Citrus fruit juices and citrus fruits, such as oranges, lemons, and limes. ? Tomato-based foods. These include red sauce, chili, salsa, and pizza with red sauce. ? Fried and fatty foods. These include donuts, french fries, potato chips, and high-fat dressings. ? High-fat meats. These include hot dogs, rib eye steak, sausage, ham, and bacon. ? High-fat dairy items, such as whole milk, butter, and cream cheese.  Eat small meals often. Avoid eating large meals.  Avoid drinking large amounts of liquid with your meals.  Avoid eating meals during the 2-3 hours before bedtime.  Avoid lying down right after you eat.  Do not exercise right after you eat. Lifestyle   Do not  use any products that contain nicotine or tobacco. These include cigarettes, e-cigarettes, and chewing tobacco. If you need help quitting, ask your doctor.  Try to lower your stress. If you need help doing this, ask your doctor.  If you are overweight, lose an amount of weight that is healthy for you. Ask your doctor about a safe weight loss goal. General instructions  Pay attention to any changes in  your symptoms.  Take over-the-counter and prescription medicines only as told by your doctor. Do not take aspirin, ibuprofen, or other NSAIDs unless your doctor says it is okay.  Wear loose clothes. Do not wear anything tight around your waist.  Raise (elevate) the head of your bed about 6 inches (15 cm).  Avoid bending over if this makes your symptoms worse.  Keep all follow-up visits as told by your doctor. This is important. Contact a doctor if:  You have new symptoms.  You lose weight and you do not know why.  You have trouble swallowing or it hurts to swallow.  You have wheezing or a cough that keeps happening.  Your symptoms do not get better with treatment.  You have a hoarse voice. Get help right away if:  You have pain in your arms, neck, jaw, teeth, or back.  You feel sweaty, dizzy, or light-headed.  You have chest pain or shortness of breath.  You throw up (vomit) and your throw-up looks like blood or coffee grounds.  You pass out (faint).  Your poop (stool) is bloody or black.  You cannot swallow, drink, or eat. Summary  If a person has gastroesophageal reflux disease (GERD), food and stomach acid move back up into the esophagus and cause symptoms or problems such as damage to the esophagus.  Treatment will depend on how bad your symptoms are.  Follow a diet as told by your doctor.  Take all medicines only as told by your doctor. This information is not intended to replace advice given to you by your health care provider. Make sure you discuss any questions you have with your health care provider. Document Released: 12/11/2007 Document Revised: 12/31/2017 Document Reviewed: 12/31/2017 Elsevier Interactive Patient Education  2019 [employer].

## 2018-09-10 ENCOUNTER — Ambulatory Visit: Payer: Self-pay | Admitting: Family Medicine

## 2018-09-12 ENCOUNTER — Other Ambulatory Visit: Payer: Self-pay | Admitting: Family Medicine

## 2018-09-12 DIAGNOSIS — E119 Type 2 diabetes mellitus without complications: Secondary | ICD-10-CM

## 2018-09-14 ENCOUNTER — Other Ambulatory Visit: Payer: Self-pay | Admitting: Family Medicine

## 2018-09-14 DIAGNOSIS — E119 Type 2 diabetes mellitus without complications: Secondary | ICD-10-CM

## 2018-09-14 MED ORDER — PIOGLITAZONE HCL 45 MG PO TABS: 45.0000 mg | ORAL_TABLET | Freq: Every day | ORAL | 4 refills | Status: DC

## 2018-09-14 MED ORDER — METFORMIN HCL 1000 MG PO TABS: 1000.0000 mg | ORAL_TABLET | Freq: Every day | ORAL | 4 refills | Status: DC

## 2018-09-14 MED ORDER — SAXAGLIPTIN HCL 5 MG PO TABS: 5.0000 mg | ORAL_TABLET | Freq: Every day | ORAL | 4 refills | Status: DC

## 2018-09-14 NOTE — Telephone Encounter (Signed)
Needs a refill on  Onglyza 5 mg   Actos 45   Metformin 1000 mg  She has an appt with Dr Sherrie Mustache on Wednesday but she is going to be out before then  [employer]

## 2018-09-16 ENCOUNTER — Other Ambulatory Visit: Payer: Self-pay

## 2018-09-16 ENCOUNTER — Encounter: Payer: Self-pay | Admitting: Family Medicine

## 2018-09-16 ENCOUNTER — Ambulatory Visit (INDEPENDENT_AMBULATORY_CARE_PROVIDER_SITE_OTHER): Payer: Medicare Other | Admitting: Family Medicine

## 2018-09-16 VITALS — BP 110/64 | HR 88 | Temp 98.0°F | Resp 16 | Wt 178.0 lb

## 2018-09-16 DIAGNOSIS — I10 Essential (primary) hypertension: Secondary | ICD-10-CM | POA: Diagnosis not present

## 2018-09-16 DIAGNOSIS — E119 Type 2 diabetes mellitus without complications: Secondary | ICD-10-CM

## 2018-09-16 DIAGNOSIS — K219 Gastro-esophageal reflux disease without esophagitis: Secondary | ICD-10-CM | POA: Diagnosis not present

## 2018-09-16 LAB — POCT GLYCOSYLATED HEMOGLOBIN (HGB A1C)
Est. average glucose Bld gHb Est-mCnc: 134
Hemoglobin A1C: 6.3 % — AB (ref 4.0–5.6)

## 2018-09-16 MED ORDER — FOSINOPRIL SODIUM 40 MG PO TABS: 40.0000 mg | ORAL_TABLET | Freq: Every day | ORAL | 4 refills | Status: DC

## 2018-09-16 MED ORDER — HYDROCHLOROTHIAZIDE 25 MG PO TABS: 25.0000 mg | ORAL_TABLET | Freq: Every day | ORAL | 4 refills | Status: DC

## 2018-09-16 NOTE — Patient Instructions (Signed)
.   Please review the attached list of medications and notify my office if there are any errors.   . Please bring all of your medications to every appointment so we can make sure that our medication list is the same as yours.   

## 2018-09-16 NOTE — Progress Notes (Signed)
Patient: Natasha Phillips Female    DOB: 02-Sep-1950   68 y.o.   MRN: [id number] Visit Date: 09/16/2018  Today's Provider: Mila Merry, MD   Chief Complaint  Patient presents with  . Hypertension  . Diabetes   Subjective:     HPI  Follow up for GERD:  The patient was last seen for this 3 weeks ago. Changes made at last visit include starting Omeprazole 40mg  daily.  She reports good compliance with treatment. She feels that condition is Improved. Is now only having symptoms once or twice a week including some heartburn and difficulty swallowing. She has made significant improvement in her diet.  She is not having side effects.   ------------------------------------------------------------------------------------  Hypertension, follow-up:  BP Readings from Last 3 Encounters:  09/16/18 110/64  08/24/18 112/60  04/27/18 122/70    She was last seen for hypertension 6 months ago.  BP at that visit was 122/82. Management since that visit includes no changes. She reports good compliance with treatment. She is not having side effects.  She is exercising. She is adherent to low salt diet.   Outside blood pressures are not checked at home. She is experiencing none.  Patient denies chest pain, chest pressure/discomfort, claudication, dyspnea, exertional chest pressure/discomfort, fatigue, irregular heart beat, lower extremity edema, near-syncope, orthopnea, palpitations, paroxysmal nocturnal dyspnea, syncope and tachypnea.   Cardiovascular risk factors include advanced age (older than 81 for men, 67 for women), diabetes mellitus and hypertension.  Use of agents associated with hypertension: none.     Weight trend: decreasing steadily Wt Readings from Last 3 Encounters:  09/16/18 178 lb (80.7 kg)  08/24/18 186 lb (84.4 kg)  04/27/18 187 lb (84.8 kg)    Current diet: well balanced  ------------------------------------------------------------------------  Diabetes  Mellitus Type II, Follow-up:   Lab Results  Component Value Date   HGBA1C 6.4 (A) 03/12/2018   HGBA1C 6.1 09/09/2017   HGBA1C 5.8 03/11/2017    Last seen for diabetes 6 months ago.  Management since then includes no changes. She reports good compliance with treatment. She is not having side effects.  Current symptoms include none and have been stable. Home blood sugar records: blood sugars have not been checked recently  Episodes of hypoglycemia? no   Current Insulin Regimen: none Most Recent Eye Exam: 07/31/2018 Weight trend: decreasing steadily Prior visit with dietician: No Current exercise: cardiovascular workout on exercise equipment Current diet habits: well balanced   Pertinent Labs:    Component Value Date/Time   CHOL 144 10/07/2017 1039   TRIG 37 10/07/2017 1039   HDL 60 10/07/2017 1039   LDLCALC 77 10/07/2017 1039   CREATININE 0.97 10/07/2017 1039    Wt Readings from Last 3 Encounters:  09/16/18 178 lb (80.7 kg)  08/24/18 186 lb (84.4 kg)  04/27/18 187 lb (84.8 kg)    ------------------------------------------------------------------------  Allergies  Allergen Reactions  . Januvia [Sitagliptin] Diarrhea  . Sulfa Antibiotics      Current Outpatient Medications:  .  fluticasone (FLONASE) 50 MCG/ACT nasal spray, Place 2 sprays into both nostrils daily., Disp: 16 g, Rfl: 3 .  fosinopril (MONOPRIL) 40 MG tablet, Take 1 tablet (40 mg total) by mouth daily., Disp: 90 tablet, Rfl: 4 .  hydrochlorothiazide (HYDRODIURIL) 25 MG tablet, Take 1 tablet (25 mg total) by mouth daily., Disp: 90 tablet, Rfl: 4 .  meloxicam (MOBIC) 15 MG tablet, Take 1 tablet (15 mg total) by mouth daily. (Patient taking differently: Take 15  mg by mouth as needed. ), Disp: 90 tablet, Rfl: 3 .  metFORMIN (GLUCOPHAGE) 1000 MG tablet, Take 1 tablet (1,000 mg total) by mouth daily., Disp: 90 tablet, Rfl: 4 .  MULTIPLE VITAMINS-CALCIUM PO, Take 1 tablet by mouth daily. , Disp: , Rfl:  .   omeprazole (PRILOSEC) 40 MG capsule, Take 1 capsule (40 mg total) by mouth daily., Disp: 30 capsule, Rfl: 3 .  pioglitazone (ACTOS) 45 MG tablet, Take 1 tablet (45 mg total) by mouth daily., Disp: 90 tablet, Rfl: 4 .  saxagliptin HCl (ONGLYZA) 5 MG TABS tablet, Take 1 tablet (5 mg total) by mouth daily., Disp: 90 tablet, Rfl: 4  Review of Systems  Constitutional: Negative for appetite change, chills, fatigue and fever.  Respiratory: Negative for chest tightness and shortness of breath.   Cardiovascular: Negative for chest pain and palpitations.  Gastrointestinal: Negative for abdominal pain, nausea and vomiting.  Neurological: Negative for dizziness and weakness.    Social History   Tobacco Use  . Smoking status: Never Smoker  . Smokeless tobacco: Never Used  Substance Use Topics  . Alcohol use: No      Objective:   BP 110/64 (BP Location: Left Arm, Patient Position: Sitting, Cuff Size: Large)   Pulse 88   Temp 98 F (36.7 C) (Oral)   Resp 16   Wt 178 lb (80.7 kg)   SpO2 99% Comment: room air  BMI 33.63 kg/m  Vitals:   09/16/18 0952  BP: 110/64  Pulse: 88  Resp: 16  Temp: 98 F (36.7 C)  TempSrc: Oral  SpO2: 99%  Weight: 178 lb (80.7 kg)     Physical Exam   General Appearance:    Alert, cooperative, no distress  Eyes:    PERRL, conjunctiva/corneas clear, EOM's intact       Lungs:     Clear to auscultation bilaterally, respirations unlabored  Heart:    Regular rate and rhythm  Neurologic:   Awake, alert, oriented x 3. No apparent focal neurological           defect.       Results for orders placed or performed in visit on 09/16/18  POCT HgB A1C  Result Value Ref Range   Hemoglobin A1C 6.3 (A) 4.0 - 5.6 %   HbA1c POC (<> result, manual entry)     HbA1c, POC (prediabetic range)     HbA1c, POC (controlled diabetic range)     Est. average glucose Bld gHb Est-mCnc 134        Assessment & Plan    1. Controlled type 2 diabetes mellitus without complication,  without long-term current use of insulin (HCC) Well controlled Continue current medications.   - POCT HgB A1C  2. Essential (primary) hypertension Well controlled.  Continue current medications.   - hydrochlorothiazide (HYDRODIURIL) 25 MG tablet; Take 1 tablet (25 mg total) by mouth daily.  Dispense: 90 tablet; Refill: 4 - fosinopril (MONOPRIL) 40 MG tablet; Take 1 tablet (40 mg total) by mouth daily.  Dispense: 90 tablet; Refill: 4  3. Gastroesophageal reflux disease, esophagitis presence not specified Much better since starting omeprazole. Continue current medications.        Mila Merry, MD  [facility]

## 2018-09-17 ENCOUNTER — Telehealth: Payer: Self-pay

## 2018-09-17 NOTE — Telephone Encounter (Signed)
Done

## 2018-09-17 NOTE — Telephone Encounter (Signed)
Patient is called office stating that she had left her AVS in patient room and states that she called office and was told that it would be left upfront for pick up. Patient would like for CMA to mail her AVS to her home address. Home address was confirmed with patient. KW

## 2018-11-23 ENCOUNTER — Other Ambulatory Visit: Payer: Self-pay | Admitting: Family Medicine

## 2018-11-23 DIAGNOSIS — K219 Gastro-esophageal reflux disease without esophagitis: Secondary | ICD-10-CM

## 2018-11-23 NOTE — Telephone Encounter (Signed)
Pharmacy requesting refills. Thanks!  

## 2019-01-19 ENCOUNTER — Telehealth: Payer: Self-pay

## 2019-01-19 NOTE — Telephone Encounter (Signed)
Patient states she got water in her ear on Saturday and woke up on Sunday morning she felt unbalanced.Sunday afternoon she brought some instant ear drops to see if it will help her. Patient states that she still feels a little off balance and would like something into the pharmacy if possible. Please advise.

## 2019-01-19 NOTE — Telephone Encounter (Signed)
Please advise 

## 2019-01-20 NOTE — Telephone Encounter (Signed)
Theres not any prescriptions that would help with this. If she is having any ear pain or if still off balance then she needs to make an appointment for evaluation.

## 2019-01-22 NOTE — Telephone Encounter (Signed)
Apt 01/27/2019  Thanks,   -laura

## 2019-01-25 ENCOUNTER — Telehealth: Payer: Self-pay | Admitting: Family Medicine

## 2019-01-25 NOTE — Telephone Encounter (Signed)
Pt needing a call back to make her Wed. appt with Fisher a virtual visit on her iphone.  Please call pt back to arrange asap.  Thanks, [employer]

## 2019-01-25 NOTE — Telephone Encounter (Signed)
Dr. Caryn Section please review if a virtual visit is appropriate for this appointment, or should we reschedule.  She is scheduled for a F/U diabetes on Wednesday. Last A1C was on 09/16/2018 and the result was 6.3.

## 2019-01-26 ENCOUNTER — Encounter: Payer: Self-pay | Admitting: Family Medicine

## 2019-01-26 ENCOUNTER — Other Ambulatory Visit: Payer: Self-pay

## 2019-01-26 ENCOUNTER — Ambulatory Visit (INDEPENDENT_AMBULATORY_CARE_PROVIDER_SITE_OTHER): Payer: Medicare Other | Admitting: Family Medicine

## 2019-01-26 DIAGNOSIS — H6690 Otitis media, unspecified, unspecified ear: Secondary | ICD-10-CM

## 2019-01-26 MED ORDER — AMOXICILLIN 500 MG PO CAPS: 1000.0000 mg | ORAL_CAPSULE | Freq: Two times a day (BID) | ORAL | 0 refills | Status: AC

## 2019-01-26 NOTE — Telephone Encounter (Signed)
This patient was scheduled to come in tomorrow for DM f/u. I called to reschedule her appointment. She states she is still having problems with left ear pain, dizziness and hacking up green mucus. She called the office over a week ago about these symptoms.  Patient denies any cough, fever, shortness of breath. Virtual appointment has been made for today at 1:40pm for evaluation of acute problem. Patient does not want to come in the office right now due to possible risk of being exposed to COVID-19.

## 2019-01-26 NOTE — Progress Notes (Signed)
Patient: Natasha Phillips Female    DOB: 09/17/1950   68 y.o.   MRN: [id number] Visit Date: 01/26/2019  Today's Provider: Mila Merryonald , MD   Chief Complaint  Patient presents with  . Otalgia   Subjective:     Otalgia  There is pain in the left ear. This is a new problem. The current episode started 1 to 4 weeks ago. The problem has been unchanged. There has been no fever. The pain is mild. Pertinent negatives include no abdominal pain, coughing, diarrhea, ear discharge, sore throat or vomiting. She has tried nothing for the symptoms.    Allergies  Allergen Reactions  . Januvia [Sitagliptin] Diarrhea  . Sulfa Antibiotics      Current Outpatient Medications:  .  fluticasone (FLONASE) 50 MCG/ACT nasal spray, Place 2 sprays into both nostrils daily., Disp: 16 g, Rfl: 3 .  fosinopril (MONOPRIL) 40 MG tablet, Take 1 tablet (40 mg total) by mouth daily., Disp: 90 tablet, Rfl: 4 .  hydrochlorothiazide (HYDRODIURIL) 25 MG tablet, Take 1 tablet (25 mg total) by mouth daily., Disp: 90 tablet, Rfl: 4 .  meloxicam (MOBIC) 15 MG tablet, Take 1 tablet (15 mg total) by mouth daily. (Patient taking differently: Take 15 mg by mouth as needed. ), Disp: 90 tablet, Rfl: 3 .  metFORMIN (GLUCOPHAGE) 1000 MG tablet, Take 1 tablet (1,000 mg total) by mouth daily., Disp: 90 tablet, Rfl: 4 .  MULTIPLE VITAMINS-CALCIUM PO, Take 1 tablet by mouth daily. , Disp: , Rfl:  .  pioglitazone (ACTOS) 45 MG tablet, Take 1 tablet (45 mg total) by mouth daily., Disp: 90 tablet, Rfl: 4 .  saxagliptin HCl (ONGLYZA) 5 MG TABS tablet, Take 1 tablet (5 mg total) by mouth daily., Disp: 90 tablet, Rfl: 4 .  omeprazole (PRILOSEC) 40 MG capsule, TAKE 1 CAPSULE(40 MG) BY MOUTH DAILY (Patient not taking: Reported on 01/26/2019), Disp: 90 capsule, Rfl: 4  Review of Systems  Constitutional: Negative for appetite change, chills, fatigue and fever.  HENT: Positive for ear pain. Negative for ear discharge and sore throat.      Green mucus production  Respiratory: Negative for cough, chest tightness and shortness of breath.   Cardiovascular: Negative for chest pain and palpitations.  Gastrointestinal: Negative for abdominal pain, diarrhea, nausea and vomiting.  Neurological: Positive for dizziness. Negative for weakness.    Social History   Tobacco Use  . Smoking status: Never Smoker  . Smokeless tobacco: Never Used  Substance Use Topics  . Alcohol use: No      Objective:   There were no vitals taken for this visit. There were no vitals filed for this visit.   Physical Exam   General Appearance:    Alert, cooperative, no distress  HENT:   right TM normal without fluid or infection, neck without nodes and sinuses nontender. Left TM dull and bulging.   Eyes:    PERRL, conjunctiva/corneas clear, EOM's intact       Lungs:     Clear to auscultation bilaterally, respirations unlabored  Heart:    Normal heart rate. Normal rhythm.  2/6 systolic murmur at right upper sternal border  Neurologic:   Awake, alert, oriented x 3. No apparent focal neurological           defect.            Assessment & Plan    1. Acute otitis media, unspecified otitis media type  - amoxicillin (AMOXIL) 500 MG capsule;  Take 2 capsules (1,000 mg total) by mouth 2 (two) times daily for 14 days.  Dispense: 56 capsule; Refill: 0 Call if symptoms change or if not rapidly improving.     The entirety of the information documented in the History of Present Illness, Review of Systems and Physical Exam were personally obtained by me. Portions of this information were initially documented by Meyer Cory, CMA and reviewed by me for thoroughness and accuracy.      Lelon Huh, MD  [facility]

## 2019-01-27 ENCOUNTER — Ambulatory Visit: Payer: Self-pay | Admitting: Family Medicine

## 2019-01-31 ENCOUNTER — Other Ambulatory Visit: Payer: Self-pay

## 2019-01-31 DIAGNOSIS — S00522A Blister (nonthermal) of oral cavity, initial encounter: Secondary | ICD-10-CM | POA: Diagnosis not present

## 2019-01-31 DIAGNOSIS — E119 Type 2 diabetes mellitus without complications: Secondary | ICD-10-CM | POA: Diagnosis not present

## 2019-01-31 DIAGNOSIS — Z7984 Long term (current) use of oral hypoglycemic drugs: Secondary | ICD-10-CM | POA: Insufficient documentation

## 2019-01-31 DIAGNOSIS — S00502A Unspecified superficial injury of oral cavity, initial encounter: Secondary | ICD-10-CM | POA: Diagnosis present

## 2019-01-31 DIAGNOSIS — Y9389 Activity, other specified: Secondary | ICD-10-CM | POA: Insufficient documentation

## 2019-01-31 DIAGNOSIS — Y929 Unspecified place or not applicable: Secondary | ICD-10-CM | POA: Diagnosis not present

## 2019-01-31 DIAGNOSIS — X58XXXA Exposure to other specified factors, initial encounter: Secondary | ICD-10-CM | POA: Insufficient documentation

## 2019-01-31 DIAGNOSIS — Y999 Unspecified external cause status: Secondary | ICD-10-CM | POA: Diagnosis not present

## 2019-01-31 DIAGNOSIS — Z79899 Other long term (current) drug therapy: Secondary | ICD-10-CM | POA: Insufficient documentation

## 2019-01-31 DIAGNOSIS — I10 Essential (primary) hypertension: Secondary | ICD-10-CM | POA: Insufficient documentation

## 2019-01-31 NOTE — ED Triage Notes (Signed)
Pt states she got a husk from some popcorn stuck to the roof of her mouth pta. Pt now has hematoma noted to palate with open area of skin. No drooling, no resp distress noted.

## 2019-02-01 ENCOUNTER — Telehealth: Payer: Self-pay | Admitting: Family Medicine

## 2019-02-01 ENCOUNTER — Emergency Department
Admission: EM | Admit: 2019-02-01 | Discharge: 2019-02-01 | Disposition: A | Discharge status: Home / Self Care | Payer: Medicare Other | Attending: Emergency Medicine | Admitting: Emergency Medicine

## 2019-02-01 DIAGNOSIS — S00522A Blister (nonthermal) of oral cavity, initial encounter: Secondary | ICD-10-CM | POA: Diagnosis not present

## 2019-02-01 MED ORDER — DEXAMETHASONE SODIUM PHOSPHATE 10 MG/ML IJ SOLN: 10.0000 mg | Freq: Once | INTRAMUSCULAR | Status: DC

## 2019-02-01 MED ORDER — DEXAMETHASONE 4 MG PO TABS
12.0000 mg | ORAL_TABLET | Freq: Once | ORAL | Status: AC
  Administered 2019-02-01: 12 mg via ORAL
  Filled 2019-02-01: qty 3

## 2019-02-01 MED ORDER — SODIUM CHLORIDE 0.9 % IV SOLN: 3.0000 g | Freq: Once | INTRAVENOUS | Status: DC

## 2019-02-01 MED ORDER — METHYLPREDNISOLONE 4 MG PO TBPK: ORAL_TABLET | ORAL | 0 refills | Status: DC

## 2019-02-01 MED ORDER — MAGIC MOUTHWASH W/LIDOCAINE: 5.0000 mL | Freq: Four times a day (QID) | ORAL | 0 refills | Status: DC | PRN

## 2019-02-01 MED ORDER — BENZOCAINE 20 % MT SOLN: Freq: Once | OROMUCOSAL | Status: DC

## 2019-02-01 NOTE — ED Notes (Signed)
Per Sudie Bailey from CT patient refusing to have CT at this time until speaking with the doctor.  This RN in to start IV and patient refusing until speaks with MD.  Dr. Ellender Hose notified and into patient room.

## 2019-02-01 NOTE — Discharge Instructions (Addendum)
As we discussed, I would recommend staying for a CT scan.  However, for now, we will start a steroid.  You have been given a first dose here.  I recommend drinking ice water to help with swelling and pain.    As we discussed, there is possibility that the swelling worsens.  This could cause compromise of your airway.  If this does happen, this is an emergency that could lead to death.  It is very important that you return to the ER or otherwise seek medical care immediately if you develop any worsening changes in your voice such as hoarseness, difficulty swallowing, shortness of breath, or sensation of worsening fullness in your mouth.  If you begin bleeding again, I would also return to the ER.

## 2019-02-01 NOTE — Telephone Encounter (Signed)
Pt called saying she went to the ER yesterday because she was eating popcorn and had a reaction to the popcorn.  Her upper mouth had a blood blister that burst.  She was put on prednisone and a mouthwash that she was to use several times.  She wants to talk to Dr. Sabino Snipes nurse about the medications they put her on  CB#  [phone] or [phone]  [facility]

## 2019-02-01 NOTE — ED Provider Notes (Signed)
[facility] Emergency Department Provider Note  ____________________________________________   First MD Initiated Contact with Patient 02/01/19 0235     (approximate)  I have reviewed the triage vital signs and the nursing notes.   HISTORY  Chief Complaint bleeding roof of mouth     HPI Natasha Phillips is a 68 y.o. female  With PMHx HTN, DM, anemia, here with wound to the top/back of her mouth.   The patient states that earlier today, she felt like she got a popcorn kernel stuck in the roof of her mouth after accidentally burning it while eating hot food.  She tried to get the kernel out with tweezers.  She noticed significant pain at the time which then began bleeding on her way to the ED.  She states that she has since had resolution of the pain, though she feels a mild fullness in the roof of her mouth.  Denies any difficulty swallowing.  No hoarseness.  No neck pain.  She was well prior to this.  She is not on blood thinners.  Denies any difficulty speaking.  No other complaints.       Past Medical History:  Diagnosis Date   Anemia    Diabetes mellitus without complication ([place])    Hypertension     Patient Active Problem List   Diagnosis Date Noted   Gastroesophageal reflux disease 09/16/2018   Erosion of oral mucosa 10/28/2016   Thyroid nodule 10/14/2015   Abnormal cervical Papanicolaou smear 01/11/2015   Absolute anemia 01/11/2015   Cardiac murmur 01/11/2015   CN (constipation) 01/11/2015   Calcium blood increased 01/11/2015   LBP (low back pain) 01/11/2015   Adiposity 01/11/2015   Leg paresthesia 01/11/2015   Neuralgia neuritis, sciatic nerve 01/11/2015   Avitaminosis D 01/11/2015   Essential (primary) hypertension 11/22/2014   Controlled type 2 diabetes mellitus without complication ([place]) 20/25/4270    Past Surgical History:  Procedure Laterality Date   BIOPSY THYROID     [place]      Prior to Admission medications   Medication Sig Start Date End Date Taking? Authorizing Provider  amoxicillin (AMOXIL) 500 MG capsule Take 2 capsules (1,000 mg total) by mouth 2 (two) times daily for 14 days. 01/26/19 02/09/19  Birdie Sons, MD  fluticasone (FLONASE) 50 MCG/ACT nasal spray Place 2 sprays into both nostrils daily. 09/09/17   Birdie Sons, MD  fosinopril (MONOPRIL) 40 MG tablet Take 1 tablet (40 mg total) by mouth daily. 09/16/18   Birdie Sons, MD  hydrochlorothiazide (HYDRODIURIL) 25 MG tablet Take 1 tablet (25 mg total) by mouth daily. 09/16/18   Birdie Sons, MD  magic mouthwash w/lidocaine SOLN Take 5 mLs by mouth 4 (four) times daily as needed for mouth pain. 02/01/19   Duffy Bruce, MD  meloxicam (MOBIC) 15 MG tablet Take 1 tablet (15 mg total) by mouth daily. Patient taking differently: Take 15 mg by mouth as needed.  06/22/15   Margarita Rana, MD  metFORMIN (GLUCOPHAGE) 1000 MG tablet Take 1 tablet (1,000 mg total) by mouth daily. 09/14/18   Birdie Sons, MD  methylPREDNISolone (MEDROL DOSEPAK) 4 MG TBPK tablet Take as directed on packaging 02/01/19   Duffy Bruce, MD  MULTIPLE VITAMINS-CALCIUM PO Take 1 tablet by mouth daily.     [provider]  omeprazole (PRILOSEC) 40 MG capsule TAKE 1 CAPSULE(40 MG) BY MOUTH DAILY Patient not taking:  Reported on 01/26/2019 11/23/18   Malva LimesFisher, Donald E, MD  pioglitazone (ACTOS) 45 MG tablet Take 1 tablet (45 mg total) by mouth daily. 09/14/18   Malva LimesFisher, Donald E, MD  saxagliptin HCl (ONGLYZA) 5 MG TABS tablet Take 1 tablet (5 mg total) by mouth daily. 09/14/18   Malva LimesFisher, Donald E, MD    Allergies Januvia [sitagliptin] and Sulfa antibiotics  Family History  Problem Relation Age of Onset   Diabetes Mother    Heart failure Mother    Diabetes Sister    Healthy Sister    Healthy Sister    Healthy Sister    Diabetes Brother    Diabetes Sister    Heart failure Father    Brain cancer  Brother    Throat cancer Brother    Glaucoma Other        Runs on Paternal Side   Breast cancer Cousin     Social History Social History   Tobacco Use   Smoking status: Never Smoker   Smokeless tobacco: Never Used  Substance Use Topics   Alcohol use: No   Drug use: No    Review of Systems  Review of Systems  Constitutional: Negative for fatigue and fever.  HENT: Positive for mouth sores. Negative for congestion and sore throat.   Eyes: Negative for visual disturbance.  Respiratory: Negative for cough and shortness of breath.   Cardiovascular: Negative for chest pain.  Gastrointestinal: Negative for abdominal pain, diarrhea, nausea and vomiting.  Genitourinary: Negative for flank pain.  Musculoskeletal: Negative for back pain and neck pain.  Skin: Negative for rash and wound.  Neurological: Negative for weakness.     ____________________________________________  PHYSICAL EXAM:      VITAL SIGNS: ED Triage Vitals  Enc Vitals Group     BP 01/31/19 2343 (!) 113/92     Pulse Rate 01/31/19 2343 90     Resp 01/31/19 2343 18     Temp 01/31/19 2343 98 F (36.7 C)     Temp Source 01/31/19 2343 Oral     SpO2 01/31/19 2343 100 %     Weight 01/31/19 2344 178 lb (80.7 kg)     Height 01/31/19 2344 5\' 1"  (1.549 m)     Head Circumference --      Peak Flow --      Pain Score 01/31/19 2343 1     Pain Loc --      Pain Edu? --      Excl. in GC? --      Physical Exam Vitals signs and nursing note reviewed.  Constitutional:      General: She is not in acute distress.    Appearance: She is well-developed.  HENT:     Head: Normocephalic and atraumatic.     Mouth/Throat:      Comments: Central, superficial ulceration to the posterior soft and hard palate, with central area of superficial mucosal tear/laceration.  The surrounding hematoma does not appear to be actively expanding or currently bleeding.  The hematoma does extend to the base of the uvula and there is mild to  moderate uvular edema.  The oropharynx is otherwise patent.  No pooling of secretions.  Normal phonation. Eyes:     Conjunctiva/sclera: Conjunctivae normal.  Neck:     Musculoskeletal: Neck supple.  Cardiovascular:     Rate and Rhythm: Normal rate and regular rhythm.     Heart sounds: Normal heart sounds. No murmur. No friction rub.  Pulmonary:  Effort: Pulmonary effort is normal. No respiratory distress.     Breath sounds: Normal breath sounds. No wheezing or rales.  Abdominal:     General: There is no distension.     Palpations: Abdomen is soft.     Tenderness: There is no abdominal tenderness.  Skin:    General: Skin is warm.     Capillary Refill: Capillary refill takes less than 2 seconds.     Findings: No rash.  Neurological:     Mental Status: She is alert and oriented to person, place, and time.     Motor: No abnormal muscle tone.       ____________________________________________   LABS (all labs ordered are listed, but only abnormal results are displayed)  Labs Reviewed  CBC WITH DIFFERENTIAL/PLATELET  BASIC METABOLIC PANEL    ____________________________________________  EKG: None ________________________________________  RADIOLOGY All imaging, including plain films, CT scans, and ultrasounds, independently reviewed by me, and interpretations confirmed via formal radiology reads.  ED MD interpretation:   Refused  Official radiology report(s): No results found.  ____________________________________________  PROCEDURES   Procedure(s) performed (including Critical Care):  Procedures  ____________________________________________  INITIAL IMPRESSION / MDM / ASSESSMENT AND PLAN / ED COURSE  As part of my medical decision making, I reviewed the following data within the electronic MEDICAL RECORD NUMBER Notes from prior ED visits and [place] Controlled Substance Database      *Kathrynn Runninghyllis Y Bougher was evaluated in Emergency Department on 02/01/2019 for the  symptoms described in the history of present illness. She was evaluated in the context of the global COVID-19 pandemic, which necessitated consideration that the patient might be at risk for infection with the SARS-CoV-2 virus that causes COVID-19. Institutional protocols and algorithms that pertain to the evaluation of patients at risk for COVID-19 are in a state of rapid change based on information released by regulatory bodies including the CDC and federal and state organizations. These policies and algorithms were followed during the patient's care in the ED.  Some ED evaluations and interventions may be delayed as a result of limited staffing during the pandemic.*      Medical Decision Making: 68 yo F here with hematoma to soft palate with mild uvular edema. Suspect traumatic hematoma 2/2 pt's attempts to remove popcorn kernel, complicated by superficial burn from food. While she does have uvular edema, she's had no change in voice, difficulty swallowing, or signs of progression for >4 hr obs in ED. While I recommended CT given location to eval ongoing bleeding or RP hematoma, pt declines and would like to return home. I discussed that without advanced imaging I am unable to determine the true extent of swelling and hematoma, and that there is a possibility that she could have an enlarging hematoma or signs of deeper injury which could cause acute airway compromise which would lead to death.  Patient fully understands this.  She states she feels mildly improved in the ED.  Will attempt Decadron as an outpatient in addition to the antibiotics that she is already on.  I gave very strict return precautions and the patient fully understands the signs of worsening airway status and will return immediately.  Family is also aware.  ____________________________________________  FINAL CLINICAL IMPRESSION(S) / ED DIAGNOSES  Final diagnoses:  Traumatic blister of mouth, initial encounter     MEDICATIONS  GIVEN DURING THIS VISIT:  Medications  benzocaine (HURRICAINE) 20 % mouth spray (has no administration in time range)  dexamethasone (DECADRON) injection 10 mg (  has no administration in time range)  Ampicillin-Sulbactam (UNASYN) 3 g in sodium chloride 0.9 % 100 mL IVPB (has no administration in time range)  dexamethasone (DECADRON) tablet 12 mg (12 mg Oral Given 02/01/19 0417)     ED Discharge Orders         Ordered    methylPREDNISolone (MEDROL DOSEPAK) 4 MG TBPK tablet     02/01/19 0401    magic mouthwash w/lidocaine SOLN  4 times daily PRN     02/01/19 0401           Note:  This document was prepared using Dragon voice recognition software and may include unintentional dictation errors.   Shaune PollackIsaacs, Marqueze Ramcharan, MD 02/01/19 [phone]

## 2019-02-01 NOTE — Telephone Encounter (Signed)
Patient requesting Hospital Follow up appointment. appt scheduled 02/02/2019 at 9am.

## 2019-02-02 ENCOUNTER — Ambulatory Visit (INDEPENDENT_AMBULATORY_CARE_PROVIDER_SITE_OTHER): Payer: Medicare Other | Admitting: Family Medicine

## 2019-02-02 ENCOUNTER — Encounter: Payer: Self-pay | Admitting: Family Medicine

## 2019-02-02 ENCOUNTER — Other Ambulatory Visit: Payer: Self-pay

## 2019-02-02 VITALS — BP 156/82 | HR 98 | Temp 98.2°F | Resp 16 | Wt 172.0 lb

## 2019-02-02 DIAGNOSIS — E559 Vitamin D deficiency, unspecified: Secondary | ICD-10-CM | POA: Diagnosis not present

## 2019-02-02 DIAGNOSIS — E119 Type 2 diabetes mellitus without complications: Secondary | ICD-10-CM

## 2019-02-02 DIAGNOSIS — E041 Nontoxic single thyroid nodule: Secondary | ICD-10-CM | POA: Diagnosis not present

## 2019-02-02 DIAGNOSIS — I10 Essential (primary) hypertension: Secondary | ICD-10-CM | POA: Diagnosis not present

## 2019-02-02 DIAGNOSIS — S00522D Blister (nonthermal) of oral cavity, subsequent encounter: Secondary | ICD-10-CM | POA: Diagnosis not present

## 2019-02-02 LAB — POCT GLYCOSYLATED HEMOGLOBIN (HGB A1C)
Est. average glucose Bld gHb Est-mCnc: 123
Hemoglobin A1C: 5.9 % — AB (ref 4.0–5.6)

## 2019-02-02 NOTE — Patient Instructions (Signed)
.   Please review the attached list of medications and notify my office if there are any errors.   . Please bring all of your medications to every appointment so we can make sure that our medication list is the same as yours.   . We will have flu vaccines available after Labor Day. Please go to your pharmacy or call the office in early September to schedule you flu shot.   

## 2019-02-02 NOTE — Progress Notes (Signed)
Patient: Natasha Phillips Female    DOB: [birth year]   68 y.o.   MRN: [id number] Visit Date: 02/02/2019  Today's Provider: Lelon Huh, MD   Chief Complaint  Patient presents with  . Follow-up   Subjective:     HPI  Follow up ER visit  Patient was seen in ER for Traumatic blister of mouth on 01/31/2019 after she got popcorn husk stuck in gums.  Treatment for this included  Decadron as an outpatient in addition to the antibiotics that she was already on.   She reports good compliance with treatment. She reports this condition is Improved. Still has some tenderness in her mouth.   ------------------------------------------------------------------------------------  She is also due for follow up of diabetes and to check A1c. Lab Results  Component Value Date   HGBA1C 5.9 (A) 02/02/2019   HGBA1C 6.3 (A) 09/16/2018   HGBA1C 6.4 (A) 03/12/2018   Is doing well with current medications. Has lost some weight.   Wt Readings from Last 5 Encounters:  02/02/19 172 lb (78 kg)  01/31/19 178 lb (80.7 kg)  09/16/18 178 lb (80.7 kg)  08/24/18 186 lb (84.4 kg)  04/27/18 187 lb (84.8 kg)  ]  Allergies  Allergen Reactions  . Januvia [Sitagliptin] Diarrhea  . Sulfa Antibiotics      Current Outpatient Medications:  .  amoxicillin (AMOXIL) 500 MG capsule, Take 2 capsules (1,000 mg total) by mouth 2 (two) times daily for 14 days., Disp: 56 capsule, Rfl: 0 .  fluticasone (FLONASE) 50 MCG/ACT nasal spray, Place 2 sprays into both nostrils daily., Disp: 16 g, Rfl: 3 .  fosinopril (MONOPRIL) 40 MG tablet, Take 1 tablet (40 mg total) by mouth daily., Disp: 90 tablet, Rfl: 4 .  hydrochlorothiazide (HYDRODIURIL) 25 MG tablet, Take 1 tablet (25 mg total) by mouth daily., Disp: 90 tablet, Rfl: 4 .  magic mouthwash w/lidocaine SOLN, Take 5 mLs by mouth 4 (four) times daily as needed for mouth pain., Disp: 100 mL, Rfl: 0 .  meloxicam (MOBIC) 15 MG tablet, Take 1 tablet (15 mg total) by  mouth daily. (Patient taking differently: Take 15 mg by mouth as needed. ), Disp: 90 tablet, Rfl: 3 .  metFORMIN (GLUCOPHAGE) 1000 MG tablet, Take 1 tablet (1,000 mg total) by mouth daily., Disp: 90 tablet, Rfl: 4 .  methylPREDNISolone (MEDROL DOSEPAK) 4 MG TBPK tablet, Take as directed on packaging, Disp: 21 tablet, Rfl: 0 .  MULTIPLE VITAMINS-CALCIUM PO, Take 1 tablet by mouth daily. , Disp: , Rfl:  .  omeprazole (PRILOSEC) 40 MG capsule, TAKE 1 CAPSULE(40 MG) BY MOUTH DAILY, Disp: 90 capsule, Rfl: 4 .  pioglitazone (ACTOS) 45 MG tablet, Take 1 tablet (45 mg total) by mouth daily., Disp: 90 tablet, Rfl: 4 .  saxagliptin HCl (ONGLYZA) 5 MG TABS tablet, Take 1 tablet (5 mg total) by mouth daily., Disp: 90 tablet, Rfl: 4  Review of Systems  Constitutional: Negative for appetite change, chills, fatigue and fever.  HENT:       Mouth pain  Respiratory: Negative for chest tightness and shortness of breath.   Cardiovascular: Negative for chest pain and palpitations.  Gastrointestinal: Negative for abdominal pain, nausea and vomiting.  Neurological: Negative for dizziness and weakness.    Social History   Tobacco Use  . Smoking status: Never Smoker  . Smokeless tobacco: Never Used  Substance Use Topics  . Alcohol use: No      Objective:   BP (!) 156/82 (  BP Location: Right Arm, Cuff Size: Large)   Pulse 98   Temp 98.2 F (36.8 C)   Resp 16   Wt 172 lb (78 kg)   SpO2 99% Comment: room air  BMI 32.50 kg/m  Vitals:   02/02/19 0907 02/02/19 0911  BP: (!) 150/80 (!) 156/82  Pulse: 98   Resp: 16   Temp: 98.2 F (36.8 C)   SpO2: 99%   Weight: 172 lb (78 kg)      Physical Exam   General Appearance:    Alert, cooperative, no distress  Eyes:    PERRL, conjunctiva/corneas clear, EOM's intact       Lungs:     Clear to auscultation bilaterally, respirations unlabored  Heart:    Normal heart rate. Normal rhythm.   Neurologic:   Awake, alert, oriented x 3. No apparent focal  neurological           defect.       Healing blistering eruption over hard palate. No erythema.   Results for orders placed or performed in visit on 02/02/19  POCT glycosylated hemoglobin (Hb A1C)  Result Value Ref Range   Hemoglobin A1C 5.9 (A) 4.0 - 5.6 %   HbA1c POC (<> result, manual entry)     HbA1c, POC (prediabetic range)     HbA1c, POC (controlled diabetic range)     Est. average glucose Bld gHb Est-mCnc 123        Assessment & Plan    1. Traumatic blister of mouth, subsequent encounter Healing well after steroid given in ER, continue amoxicillin and magic mouthwash.   2. Controlled type 2 diabetes mellitus without complication, without long-term current use of insulin (HCC) Doing well on current meds. She is losing some weight likely due to metformin. Consider reducing dose of she continues to lose weight.  - Comprehensive metabolic panel - Lipid panel  3. Thyroid nodule Due to check- TSH  4. Avitaminosis D  - VITAMIN D 25 Hydroxy (Vit-D Deficiency, Fractures)  5. Essential (primary) hypertension  - Comprehensive metabolic panel  The entirety of the information documented in the History of Present Illness, Review of Systems and Physical Exam were personally obtained by me. Portions of this information were initially documented by Awilda Billoshena Chambers, CMA and reviewed by me for thoroughness and accuracy.      Mila Merryonald Lissandra Keil, MD  [facility]

## 2019-02-03 ENCOUNTER — Telehealth: Payer: Self-pay

## 2019-02-03 LAB — COMPREHENSIVE METABOLIC PANEL
ALT: 9 IU/L (ref 0–32)
AST: 18 IU/L (ref 0–40)
Albumin/Globulin Ratio: 1.4 (ref 1.2–2.2)
Albumin: 4.4 g/dL (ref 3.8–4.8)
Alkaline Phosphatase: 104 IU/L (ref 39–117)
BUN/Creatinine Ratio: 22 (ref 12–28)
BUN: 24 mg/dL (ref 8–27)
Bilirubin Total: 0.2 mg/dL (ref 0.0–1.2)
CO2: 26 mmol/L (ref 20–29)
Calcium: 10.7 mg/dL — ABNORMAL HIGH (ref 8.7–10.3)
Chloride: 100 mmol/L (ref 96–106)
Creatinine, Ser: 1.11 mg/dL — ABNORMAL HIGH (ref 0.57–1.00)
GFR calc Af Amer: 60 mL/min/{1.73_m2} (ref >59)
GFR calc non Af Amer: 52 mL/min/{1.73_m2} — ABNORMAL LOW (ref >59)
Globulin, Total: 3.1 g/dL (ref 1.5–4.5)
Glucose: 171 mg/dL — ABNORMAL HIGH (ref 65–99)
Potassium: 4.5 mmol/L (ref 3.5–5.2)
Sodium: 141 mmol/L (ref 134–144)
Total Protein: 7.5 g/dL (ref 6.0–8.5)

## 2019-02-03 LAB — LIPID PANEL
Chol/HDL Ratio: 2.2 ratio (ref 0.0–4.4)
Cholesterol, Total: 135 mg/dL (ref 100–199)
HDL: 62 mg/dL (ref >39)
LDL Calculated: 63 mg/dL (ref 0–99)
Triglycerides: 48 mg/dL (ref 0–149)
VLDL Cholesterol Cal: 10 mg/dL (ref 5–40)

## 2019-02-03 LAB — VITAMIN D 25 HYDROXY (VIT D DEFICIENCY, FRACTURES): Vit D, 25-Hydroxy: 41.2 ng/mL (ref 30.0–100.0)

## 2019-02-03 LAB — TSH: TSH: 1.06 u[IU]/mL (ref 0.450–4.500)

## 2019-02-03 NOTE — Patient Instructions (Signed)
.   Please review the attached list of medications and notify my office if there are any errors.   . Please bring all of your medications to every appointment so we can make sure that our medication list is the same as yours.   . We will have flu vaccines available after Labor Day. Please go to your pharmacy or call the office in early September to schedule you flu shot.   

## 2019-02-03 NOTE — Telephone Encounter (Signed)
Pt advised.   Thanks,   -Laura  

## 2019-02-03 NOTE — Telephone Encounter (Signed)
-----   Message from Birdie Sons, MD sent at 02/03/2019  8:04 AM EDT ----- Labs are very good. Continue current medications.  Follow up diabetes in 4 months. We may cut back on her meds if a1c is still under 6 at her follow up.

## 2019-02-22 ENCOUNTER — Telehealth: Payer: Self-pay | Admitting: Family Medicine

## 2019-02-22 NOTE — Telephone Encounter (Signed)
Pt called saying she was in a few weeks ago with inner ear.  She is still having dizziness.  She wants to know if she needs to come back in  CB#  [phone]  [facility]

## 2019-02-22 NOTE — Telephone Encounter (Signed)
Is she still having ear pain.  Is the dizziness a spinning sensation or more like feeling light headed.

## 2019-02-23 NOTE — Telephone Encounter (Signed)
Pt states the rooms spins only when she lays down.  She also feels lightheaded "off balance" when moving around.  She denies other symptoms such as confusion, numbness, falling, but does report her ears still hurt some.  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  She finished the prednisone taper and only has a few more days of amoxicillin.   Pt was okay with making an appointment if needed.     Thanks,   -Mickel Baas

## 2019-02-23 NOTE — Telephone Encounter (Signed)
Sounds like vertigo, can send prescription for meclizine, but why is she still taking amoxicillin? It was prescribed a month ago, she should have run out two weeks ago.

## 2019-02-23 NOTE — Telephone Encounter (Signed)
LMTCB 02/23/2019  Thanks,   -Kashif Pooler  

## 2019-02-24 MED ORDER — MECLIZINE HCL 25 MG PO TABS: 25.0000 mg | ORAL_TABLET | Freq: Three times a day (TID) | ORAL | 0 refills | Status: DC | PRN

## 2019-02-24 NOTE — Telephone Encounter (Signed)
Amoxicillin was supposed to be 2 tablets twice a day. 2 tablets once a is not high enough dose to clear sinus infection. Recommend she start on Augmentin 875, which is just one tablet TWICE a day. Can send in prescription for 20 tablets for 10 day supply.

## 2019-02-24 NOTE — Telephone Encounter (Signed)
Pt advised.  RX sent to [facility] in [place].  Pt states today is her last day of amoxicillin she has been taking 2 tablets once a day.    Thanks,   -Mickel Baas

## 2019-02-26 MED ORDER — AMOXICILLIN-POT CLAVULANATE 875-125 MG PO TABS: 1.0000 | ORAL_TABLET | Freq: Two times a day (BID) | ORAL | 0 refills | Status: DC

## 2019-02-26 NOTE — Addendum Note (Signed)
Addended by: Ashley Royalty E on: 02/26/2019 04:14 PM   Modules accepted: Orders

## 2019-02-26 NOTE — Telephone Encounter (Signed)
Pt advised RX sent.  Thanks,   -Mickel Baas

## 2019-03-02 ENCOUNTER — Telehealth: Payer: Self-pay

## 2019-03-02 NOTE — Telephone Encounter (Signed)
Patient called and stated that she believe that the Augmentin is causing her blood sugar & blood pressure level to fluctuate. Patient states that her bs has been less than 70 to 166 from yesterday 03/01/2019 to today 03/02/2019 and her bp has been 115/64;116/60;119/67;142/87 & 155/86. Please advise.

## 2019-03-03 NOTE — Telephone Encounter (Signed)
She can put metformin on hold until she finishes Augmentin. BP fluctuarions are OK. All within acceptable range.

## 2019-03-03 NOTE — Telephone Encounter (Signed)
Pt advised.   Thanks,   -Laura  

## 2019-03-10 ENCOUNTER — Telehealth: Payer: Self-pay | Admitting: Family Medicine

## 2019-03-10 NOTE — Telephone Encounter (Signed)
Pt calling back because she has taken all of the mediciation and is still feeling not right and off balance.  Please call pt back today if possible.  Thanks, [employer]

## 2019-03-10 NOTE — Telephone Encounter (Signed)
I do not know the background information on her dizziness. Does she just mean that she is taking all of the medications for her chronic diseases?  I recommend that she reschedule appointment with her PCP or a different provider at some point this week to assess her dizziness/imbalance.  If she develops any shortness of breath, neuro changes, or chest pain, I recommend that she go to the emergency room

## 2019-03-10 NOTE — Telephone Encounter (Signed)
Patient states that she had ear infection and was taking Augmentin for it. Patient states that she finished the medication and is still been off balance after finishing the medication. Patient is going to schedule appointment to be seen tomorrow.

## 2019-03-10 NOTE — Telephone Encounter (Signed)
Please advise 

## 2019-03-11 ENCOUNTER — Other Ambulatory Visit: Payer: Self-pay

## 2019-03-11 ENCOUNTER — Ambulatory Visit (INDEPENDENT_AMBULATORY_CARE_PROVIDER_SITE_OTHER): Payer: Medicare Other | Admitting: Physician Assistant

## 2019-03-11 ENCOUNTER — Encounter: Payer: Self-pay | Admitting: Physician Assistant

## 2019-03-11 VITALS — BP 128/72 | HR 83 | Temp 97.1°F | Resp 16 | Wt 167.0 lb

## 2019-03-11 DIAGNOSIS — R42 Dizziness and giddiness: Secondary | ICD-10-CM | POA: Diagnosis not present

## 2019-03-11 DIAGNOSIS — I10 Essential (primary) hypertension: Secondary | ICD-10-CM | POA: Diagnosis not present

## 2019-03-11 MED ORDER — HYDROCHLOROTHIAZIDE 25 MG PO TABS: 12.5000 mg | ORAL_TABLET | Freq: Every day | ORAL | 4 refills | Status: DC

## 2019-03-11 MED ORDER — FOSINOPRIL SODIUM 40 MG PO TABS: 20.0000 mg | ORAL_TABLET | Freq: Every day | ORAL | 4 refills | Status: DC

## 2019-03-11 NOTE — Patient Instructions (Signed)
Decrease fosinopril to one half tablet daily (20 mg)  Decrease hydrochlorothiazide to one half tablet daily (12.5)   Hypertension, Adult Hypertension is another name for high blood pressure. High blood pressure forces your heart to work harder to pump blood. This can cause problems over time. There are two numbers in a blood pressure reading. There is a top number (systolic) over a bottom number (diastolic). It is best to have a blood pressure that is below 120/80. Healthy choices can help lower your blood pressure, or you may need medicine to help lower it. What are the causes? The cause of this condition is not known. Some conditions may be related to high blood pressure. What increases the risk?  Smoking.  Having type 2 diabetes mellitus, high cholesterol, or both.  Not getting enough exercise or physical activity.  Being overweight.  Having too much fat, sugar, calories, or salt (sodium) in your diet.  Drinking too much alcohol.  Having long-term (chronic) kidney disease.  Having a family history of high blood pressure.  Age. Risk increases with age.  Race. You may be at higher risk if you are African American.  Gender. Men are at higher risk than women before age 10545. After age 66, women are at higher risk than men.  Having obstructive sleep apnea.  Stress. What are the signs or symptoms?  High blood pressure may not cause symptoms. Very high blood pressure (hypertensive crisis) may cause: ? Headache. ? Feelings of worry or nervousness (anxiety). ? Shortness of breath. ? Nosebleed. ? A feeling of being sick to your stomach (nausea). ? Throwing up (vomiting). ? Changes in how you see. ? Very bad chest pain. ? Seizures. How is this treated?  This condition is treated by making healthy lifestyle changes, such as: ? Eating healthy foods. ? Exercising more. ? Drinking less alcohol.  Your health care provider may prescribe medicine if lifestyle changes are not  enough to get your blood pressure under control, and if: ? Your top number is above 130. ? Your bottom number is above 80.  Your personal target blood pressure may vary. Follow these instructions at home: Eating and drinking   If told, follow the DASH eating plan. To follow this plan: ? Fill one half of your plate at each meal with fruits and vegetables. ? Fill one fourth of your plate at each meal with whole grains. Whole grains include whole-wheat pasta, brown rice, and whole-grain bread. ? Eat or drink low-fat dairy products, such as skim milk or low-fat yogurt. ? Fill one fourth of your plate at each meal with low-fat (lean) proteins. Low-fat proteins include fish, chicken without skin, eggs, beans, and tofu. ? Avoid fatty meat, cured and processed meat, or chicken with skin. ? Avoid pre-made or processed food.  Eat less than 1,500 mg of salt each day.  Do not drink alcohol if: ? Your doctor tells you not to drink. ? You are pregnant, may be pregnant, or are planning to become pregnant.  If you drink alcohol: ? Limit how much you use to:  0-1 drink a day for women.  0-2 drinks a day for men. ? Be aware of how much alcohol is in your drink. In the [place] one drink equals one 12 oz bottle of beer (355 mL), one 5 oz glass of wine (148 mL), or one 1 oz glass of hard liquor (44 mL). Lifestyle   Work with your doctor to stay at a healthy weight or to lose  weight. Ask your doctor what the best weight is for you.  Get at least 30 minutes of exercise most days of the week. This may include walking, swimming, or biking.  Get at least 30 minutes of exercise that strengthens your muscles (resistance exercise) at least 3 days a week. This may include lifting weights or doing Pilates.  Do not use any products that contain nicotine or tobacco, such as cigarettes, e-cigarettes, and chewing tobacco. If you need help quitting, ask your doctor.  Check your blood pressure at home as told by  your doctor.  Keep all follow-up visits as told by your doctor. This is important. Medicines  Take over-the-counter and prescription medicines only as told by your doctor. Follow directions carefully.  Do not skip doses of blood pressure medicine. The medicine does not work as well if you skip doses. Skipping doses also puts you at risk for problems.  Ask your doctor about side effects or reactions to medicines that you should watch for. Contact a doctor if you:  Think you are having a reaction to the medicine you are taking.  Have headaches that keep coming back (recurring).  Feel dizzy.  Have swelling in your ankles.  Have trouble with your vision. Get help right away if you:  Get a very bad headache.  Start to feel mixed up (confused).  Feel weak or numb.  Feel faint.  Have very bad pain in your: ? Chest. ? Belly (abdomen).  Throw up more than once.  Have trouble breathing. Summary  Hypertension is another name for high blood pressure.  High blood pressure forces your heart to work harder to pump blood.  For most people, a normal blood pressure is less than 120/80.  Making healthy choices can help lower blood pressure. If your blood pressure does not get lower with healthy choices, you may need to take medicine. This information is not intended to replace advice given to you by your health care provider. Make sure you discuss any questions you have with your health care provider. Document Released: 12/11/2007 Document Revised: 03/04/2018 Document Reviewed: 03/04/2018 Elsevier Patient Education  2020 [employer].

## 2019-03-11 NOTE — Progress Notes (Signed)
Patient: Natasha Phillips Female    DOB: 18-Jan-1951   68 y.o.   MRN: [id number] Visit Date: 03/11/2019  Today's Provider: Trey Sailors, PA-C   Chief Complaint  Patient presents with  . Dizziness   Subjective:    Patient with history of diabetes and HTN presents today with one month of dizziness. She reports one month ago she was treated for an ear infection with augmentin and also vertigo with meclizine. At the time, she was reporting the room was spinning when she turned over in bed. Now, she reports that dizziness is more of an off balance sensation and the room is no longer spinning. This can happen when changing positions but also can happen randomly. She has checked her blood sugars during these episodes and they have been from 90-140. She has never passed out. She does not have chest pain or shortness of breath. She is prescribed monopril 40 mg daily and hctz 25 mg daily but she is taking these every other day. She says her blood pressures fluctuate. She wants to know if she is bleeding on the inside. She also wants to know if she has circulation issues. She asks if hypotension and hypertension are common things. She also wants to know why she is losing weight.   Otalgia  This is a recurrent problem. The current episode started 1 to 4 weeks ago. The problem occurs constantly. The problem has been unchanged. Associated symptoms comments: Dizziness . She has tried antibiotics for the symptoms. The treatment provided mild relief.    Wt Readings from Last 3 Encounters:  03/11/19 167 lb (75.8 kg)  02/02/19 172 lb (78 kg)  01/31/19 178 lb (80.7 kg)     Allergies  Allergen Reactions  . Januvia [Sitagliptin] Diarrhea  . Sulfa Antibiotics      Current Outpatient Medications:  .  fluticasone (FLONASE) 50 MCG/ACT nasal spray, Place 2 sprays into both nostrils daily., Disp: 16 g, Rfl: 3 .  fosinopril (MONOPRIL) 40 MG tablet, Take 0.5 tablets (20 mg total) by mouth daily., Disp:  90 tablet, Rfl: 4 .  hydrochlorothiazide (HYDRODIURIL) 25 MG tablet, Take 0.5 tablets (12.5 mg total) by mouth daily., Disp: 90 tablet, Rfl: 4 .  meloxicam (MOBIC) 15 MG tablet, Take 1 tablet (15 mg total) by mouth daily. (Patient taking differently: Take 15 mg by mouth as needed. ), Disp: 90 tablet, Rfl: 3 .  metFORMIN (GLUCOPHAGE) 1000 MG tablet, Take 1 tablet (1,000 mg total) by mouth daily., Disp: 90 tablet, Rfl: 4 .  omeprazole (PRILOSEC) 40 MG capsule, TAKE 1 CAPSULE(40 MG) BY MOUTH DAILY, Disp: 90 capsule, Rfl: 4 .  pioglitazone (ACTOS) 45 MG tablet, Take 1 tablet (45 mg total) by mouth daily., Disp: 90 tablet, Rfl: 4 .  saxagliptin HCl (ONGLYZA) 5 MG TABS tablet, Take 1 tablet (5 mg total) by mouth daily., Disp: 90 tablet, Rfl: 4 .  MULTIPLE VITAMINS-CALCIUM PO, Take 1 tablet by mouth daily. , Disp: , Rfl:   Review of Systems  Constitutional: Negative.   HENT: Positive for ear pain.   Respiratory: Negative.   Cardiovascular: Negative.   Musculoskeletal: Negative.   Neurological: Positive for dizziness.    Social History   Tobacco Use  . Smoking status: Never Smoker  . Smokeless tobacco: Never Used  Substance Use Topics  . Alcohol use: No      Objective:   BP 128/72 (BP Location: Left Arm, Patient Position: Sitting, Cuff Size: Normal)  Pulse 83   Temp (!) 97.1 F (36.2 C) (Temporal)   Resp 16   Wt 167 lb (75.8 kg)   SpO2 100%   BMI 31.55 kg/m  Vitals:   03/11/19 0831  BP: 128/72  Pulse: 83  Resp: 16  Temp: (!) 97.1 F (36.2 C)  TempSrc: Temporal  SpO2: 100%  Weight: 167 lb (75.8 kg)  Body mass index is 31.55 kg/m.   Physical Exam Constitutional:      Appearance: Normal appearance.  Cardiovascular:     Rate and Rhythm: Normal rate and regular rhythm.     Heart sounds: Normal heart sounds.  Pulmonary:     Breath sounds: Normal breath sounds.  Skin:    General: Skin is warm and dry.  Neurological:     Mental Status: She is alert and oriented to  person, place, and time. Mental status is at baseline.  Psychiatric:        Mood and Affect: Mood normal.        Behavior: Behavior normal.      No results found for any visits on 03/11/19.     Assessment & Plan    1. Dizziness  I have gone over the many kinds of dizziness. Initially, patient reports she had vertigo like symptoms and now is reporting an off balance sensation. I do not think the sensation is caused by her ear and spent a lot of time counseling that while she is not orthostatic in office today, I do think it is related to her blood pressure. Near the end of the visit patient reveals that she is in fact taking her blood pressure medications every other day for reasons that are unclear to me. In that case, her blood pressure is rather low for how infrequently she is taking her medication and she would likely benefit from cutting back. I will have her take half her dose of both medications EVERY DAY. I have advised her that her blood pressure is fluctuating due to the fact that she is taking her medications every other day.   She has many and varied questions which I have tried to answer. She wants to know if poor circulation can cause low blood pressure. This is a bit of a complex issue but I have tried to explain that poor circulation usually causes blood flow issues in hands and feet and shouldn't cause low blood pressure. However, in cases of hypovolemia, blood volume would be decreased to cause hypotension, though I don't believe this is circulation as she means it.   She wants to know if she is bleeding on the inside. I have no reason to think that she is.   She wants to know why her hands sometimes swell in the morning. I suspect this is due to the fact that she is taking HCTZ every other day.   She wants to know if hypotension and hypertension are common. I have assured her they are.  She will follow up in 3 weeks with Dr. Caryn Section. If she has any issues that arise in the  interim, she should be placed on Dr. Maralyn Sago schedule.   2. Essential (primary) hypertension  - fosinopril (MONOPRIL) 40 MG tablet; Take 0.5 tablets (20 mg total) by mouth daily.  Dispense: 90 tablet; Refill: 4 - hydrochlorothiazide (HYDRODIURIL) 25 MG tablet; Take 0.5 tablets (12.5 mg total) by mouth daily.  Dispense: 90 tablet; Refill: 4  The entirety of the information documented in the History of Present Illness, Review  of Systems and Physical Exam were personally obtained by me. Portions of this information were initially documented by Rondel BatonSulibeya Dimas, CMA and reviewed by me for thoroughness and accuracy.        Trey SailorsAdriana M Pollak, PA-C  [facility]

## 2019-03-12 ENCOUNTER — Telehealth: Payer: Self-pay | Admitting: *Deleted

## 2019-03-12 DIAGNOSIS — R42 Dizziness and giddiness: Secondary | ICD-10-CM

## 2019-03-12 DIAGNOSIS — H9201 Otalgia, right ear: Secondary | ICD-10-CM

## 2019-03-12 NOTE — Telephone Encounter (Signed)
Ok to refer.

## 2019-03-12 NOTE — Telephone Encounter (Signed)
Patient was seen in office yesterday for ear pain. Patient called back today requesting a referral for ENT. Patient believes a tube is stopped up in her ear.

## 2019-03-12 NOTE — Telephone Encounter (Signed)
Please schedule

## 2019-03-12 NOTE — Telephone Encounter (Signed)
Have sent order to [facility] 

## 2019-03-16 NOTE — Telephone Encounter (Signed)
Patient called back saying she is still having problems with her blood pressure and diabeties.  She said since her ear infection she cant get control of these issues.  CB#  [phone]  teri

## 2019-03-16 NOTE — Telephone Encounter (Signed)
Returned patient call and she states that her blood sugar was 60's this morning when she woke up. Patient states that she is concerned about her bp and she has lost 10lbs since 03/11/2019. Patient schedule appointment to come see Dr. Caryn Section on 03/19/2019.

## 2019-03-19 ENCOUNTER — Ambulatory Visit: Payer: BLUE CROSS/BLUE SHIELD | Admitting: Family Medicine

## 2019-03-19 DIAGNOSIS — K219 Gastro-esophageal reflux disease without esophagitis: Secondary | ICD-10-CM | POA: Diagnosis not present

## 2019-03-19 DIAGNOSIS — I10 Essential (primary) hypertension: Secondary | ICD-10-CM | POA: Diagnosis not present

## 2019-03-19 DIAGNOSIS — R600 Localized edema: Secondary | ICD-10-CM | POA: Diagnosis not present

## 2019-03-19 DIAGNOSIS — R0602 Shortness of breath: Secondary | ICD-10-CM | POA: Diagnosis not present

## 2019-03-19 DIAGNOSIS — R011 Cardiac murmur, unspecified: Secondary | ICD-10-CM | POA: Diagnosis not present

## 2019-03-19 DIAGNOSIS — E119 Type 2 diabetes mellitus without complications: Secondary | ICD-10-CM | POA: Diagnosis not present

## 2019-03-19 DIAGNOSIS — E669 Obesity, unspecified: Secondary | ICD-10-CM | POA: Diagnosis not present

## 2019-03-19 DIAGNOSIS — R079 Chest pain, unspecified: Secondary | ICD-10-CM | POA: Diagnosis not present

## 2019-03-19 DIAGNOSIS — M1990 Unspecified osteoarthritis, unspecified site: Secondary | ICD-10-CM | POA: Diagnosis not present

## 2019-03-23 ENCOUNTER — Telehealth: Payer: Self-pay | Admitting: Family Medicine

## 2019-03-23 DIAGNOSIS — H6063 Unspecified chronic otitis externa, bilateral: Secondary | ICD-10-CM | POA: Diagnosis not present

## 2019-03-23 DIAGNOSIS — R42 Dizziness and giddiness: Secondary | ICD-10-CM | POA: Diagnosis not present

## 2019-03-23 DIAGNOSIS — H6123 Impacted cerumen, bilateral: Secondary | ICD-10-CM | POA: Diagnosis not present

## 2019-03-23 NOTE — Telephone Encounter (Signed)
Pt called saying she has some questions regarding her blood pressure medication  CB#  [phone]  [phone]  Thanks Con Memos

## 2019-03-23 NOTE — Telephone Encounter (Signed)
Patient requested an appointment to talk with Dr. Caryn Section about her last visit with Fabio Bering and her ENT. Her dizziness has improved, but her blood pressure is still mildy elevated (140/80 range), and she was told that she may have orthostatic Hypotension. Appointment scheduled for 03/26/2019 at 10:40am.

## 2019-03-26 ENCOUNTER — Encounter: Payer: Self-pay | Admitting: Family Medicine

## 2019-03-26 ENCOUNTER — Ambulatory Visit (INDEPENDENT_AMBULATORY_CARE_PROVIDER_SITE_OTHER): Payer: Medicare Other | Admitting: Family Medicine

## 2019-03-26 ENCOUNTER — Other Ambulatory Visit: Payer: Self-pay

## 2019-03-26 VITALS — BP 128/72 | HR 80 | Temp 99.0°F | Resp 16 | Ht 61.0 in | Wt 169.0 lb

## 2019-03-26 DIAGNOSIS — E119 Type 2 diabetes mellitus without complications: Secondary | ICD-10-CM | POA: Diagnosis not present

## 2019-03-26 DIAGNOSIS — R270 Ataxia, unspecified: Secondary | ICD-10-CM

## 2019-03-26 DIAGNOSIS — I10 Essential (primary) hypertension: Secondary | ICD-10-CM | POA: Diagnosis not present

## 2019-03-26 DIAGNOSIS — R42 Dizziness and giddiness: Secondary | ICD-10-CM | POA: Diagnosis not present

## 2019-03-26 NOTE — Progress Notes (Addendum)
Patient: Natasha Phillips Female    DOB: 1950-08-01   68 y.o.   MRN: [id number] Visit Date: 03/26/2019  Today's Provider: Lelon Huh, MD   Chief Complaint  Patient presents with  . Dizziness   Subjective:   HPI Patient comes in today c/o dizziness and hypotension. She was seen in the office 03/11/2019 by Fabio Bering, and she was advised to take 1/2 tablet of fosinopril and HCTZ. She has tolerated medication changes well. However, she reports that she is still having dizzy spells. She reports that she experiences this daily. They were originally described spinning sensation and thought to be vertiginous and occurred in conjunction with sinus symptoms. After treatment with antibiotic no longer had spinning sensation, but just felt light headed and off balance when walking. No palpitations, no chest pains. No sx when sitting or at rest. She states she saw Dr. Clayborn Bigness and had normal EKG. States she usually like she is falling off the the left when walking. Symptoms have not improved since change in meds from QOD to 1/2 dose QD.   BP Readings from Last 3 Encounters:  03/26/19 128/72  03/11/19 128/72  02/02/19 (!) 156/82   Wt Readings from Last 3 Encounters:  03/26/19 169 lb (76.7 kg)  03/11/19 167 lb (75.8 kg)  02/02/19 172 lb (78 kg)    Allergies  Allergen Reactions  . Januvia [Sitagliptin] Diarrhea  . Sulfa Antibiotics      Current Outpatient Medications:  .  fluticasone (FLONASE) 50 MCG/ACT nasal spray, Place 2 sprays into both nostrils daily., Disp: 16 g, Rfl: 3 .  fosinopril (MONOPRIL) 40 MG tablet, Take 0.5 tablets (20 mg total) by mouth daily., Disp: 90 tablet, Rfl: 4 .  hydrochlorothiazide (HYDRODIURIL) 25 MG tablet, Take 0.5 tablets (12.5 mg total) by mouth daily., Disp: 90 tablet, Rfl: 4 .  meloxicam (MOBIC) 15 MG tablet, Take 1 tablet (15 mg total) by mouth daily. (Patient taking differently: Take 15 mg by mouth as needed. ), Disp: 90 tablet, Rfl: 3 .  metFORMIN  (GLUCOPHAGE) 1000 MG tablet, Take 1 tablet (1,000 mg total) by mouth daily., Disp: 90 tablet, Rfl: 4 .  MULTIPLE VITAMINS-CALCIUM PO, Take 1 tablet by mouth daily. , Disp: , Rfl:  .  omeprazole (PRILOSEC) 40 MG capsule, TAKE 1 CAPSULE(40 MG) BY MOUTH DAILY, Disp: 90 capsule, Rfl: 4 .  pioglitazone (ACTOS) 45 MG tablet, Take 1 tablet (45 mg total) by mouth daily., Disp: 90 tablet, Rfl: 4 .  saxagliptin HCl (ONGLYZA) 5 MG TABS tablet, Take 1 tablet (5 mg total) by mouth daily., Disp: 90 tablet, Rfl: 4  Review of Systems  Constitutional: Positive for activity change, appetite change and fatigue. Negative for chills, diaphoresis, fever and unexpected weight change.  Respiratory: Negative for cough and shortness of breath.   Cardiovascular: Negative for chest pain, palpitations and leg swelling.  Endocrine: Negative for cold intolerance, heat intolerance, polydipsia, polyphagia and polyuria.  Neurological: Positive for dizziness and headaches. Negative for light-headedness.    Social History   Tobacco Use  . Smoking status: Never Smoker  . Smokeless tobacco: Never Used  Substance Use Topics  . Alcohol use: No      Objective:   BP 128/72   Pulse 80   Temp 99 F (37.2 C)   Resp 16   Ht 5\' 1"  (1.549 m)   Wt 169 lb (76.7 kg)   SpO2 98%   BMI 31.93 kg/m  Vitals:   03/26/19 1039  BP: 128/72  Pulse: 80  Resp: 16  Temp: 99 F (37.2 C)  SpO2: 98%  Weight: 169 lb (76.7 kg)  Height: 5\' 1"  (1.549 m)  Body mass index is 31.93 kg/m.   Physical Exam   General Appearance:    Alert, cooperative, no distress  Eyes:    PERRL, conjunctiva/corneas clear, EOM's intact       Lungs:     Clear to auscultation bilaterally, respirations unlabored  Heart:    Normal heart rate. Normal rhythm.  2/6 blowing, holosystolic murmur at apex  MS:   All extremities are intact.   Neurologic:   Awake, alert, oriented x 3. No apparent focal neurological           Defect.faills finger to nose negative  Romberg. Falls to left when performing tandem gait. Leans to left with normal walking         Assessment & Plan    1. Essential (primary) hypertension Seems to have more stable BP since taking 1/2 tablet of meds every single day instead of full tablet every other day, but dizziness is unchanged.   2. Dizziness  - Comprehensive metabolic panel - CBC  3. Ataxia Rule out CVA. Anticipate MRI after reviewing lab results.   4. Controlled type 2 diabetes mellitus without complication, without long-term current use of insulin (HCC) She request annual urine screening while she is here today.  - Urine Microalbumin w/creat. ratio  The entirety of the information documented in the History of Present Illness, Review of Systems and Physical Exam were personally obtained by me. Portions of this information were initially documented by Anson Oregonachelle Presley, CMA and reviewed by me for thoroughness and accuracy.      Mila Merryonald Fisher, MD  [facility]

## 2019-03-26 NOTE — Patient Instructions (Addendum)
Please go to the lab draw station in [address] on the second floor of [facility] . Normal hours are 8:00am to 12:30pm and 1:30pm to 4:00pm Monday through Friday  

## 2019-03-27 LAB — CBC
Hematocrit: 33 % — ABNORMAL LOW (ref 34.0–46.6)
Hemoglobin: 11.2 g/dL (ref 11.1–15.9)
MCH: 30 pg (ref 26.6–33.0)
MCHC: 33.9 g/dL (ref 31.5–35.7)
MCV: 89 fL (ref 79–97)
Platelets: 288 10*3/uL (ref 150–450)
RBC: 3.73 x10E6/uL — ABNORMAL LOW (ref 3.77–5.28)
RDW: 13.1 % (ref 11.7–15.4)
WBC: 4.7 10*3/uL (ref 3.4–10.8)

## 2019-03-27 LAB — COMPREHENSIVE METABOLIC PANEL
ALT: 10 IU/L (ref 0–32)
AST: 18 IU/L (ref 0–40)
Albumin/Globulin Ratio: 1.6 (ref 1.2–2.2)
Albumin: 4.5 g/dL (ref 3.8–4.8)
Alkaline Phosphatase: 101 IU/L (ref 39–117)
BUN/Creatinine Ratio: 26 (ref 12–28)
BUN: 29 mg/dL — ABNORMAL HIGH (ref 8–27)
Bilirubin Total: 0.2 mg/dL (ref 0.0–1.2)
CO2: 26 mmol/L (ref 20–29)
Calcium: 10.2 mg/dL (ref 8.7–10.3)
Chloride: 97 mmol/L (ref 96–106)
Creatinine, Ser: 1.12 mg/dL — ABNORMAL HIGH (ref 0.57–1.00)
GFR calc Af Amer: 59 mL/min/{1.73_m2} — ABNORMAL LOW (ref >59)
GFR calc non Af Amer: 51 mL/min/{1.73_m2} — ABNORMAL LOW (ref >59)
Globulin, Total: 2.9 g/dL (ref 1.5–4.5)
Glucose: 93 mg/dL (ref 65–99)
Potassium: 4 mmol/L (ref 3.5–5.2)
Sodium: 137 mmol/L (ref 134–144)
Total Protein: 7.4 g/dL (ref 6.0–8.5)

## 2019-03-27 LAB — MICROALBUMIN / CREATININE URINE RATIO
Creatinine, Urine: 24.3 mg/dL
Microalb/Creat Ratio: <12 mg/g creat (ref 0–29)
Microalbumin, Urine: <3 ug/mL

## 2019-03-28 ENCOUNTER — Other Ambulatory Visit: Payer: Self-pay | Admitting: Family Medicine

## 2019-03-28 DIAGNOSIS — G3281 Cerebellar ataxia in diseases classified elsewhere: Secondary | ICD-10-CM

## 2019-03-29 ENCOUNTER — Ambulatory Visit: Payer: Medicare Other | Admitting: Obstetrics and Gynecology

## 2019-03-29 ENCOUNTER — Telehealth: Payer: Self-pay

## 2019-03-29 NOTE — Telephone Encounter (Signed)
-----   Message from Birdie Sons, MD sent at 03/28/2019  6:47 PM EDT ----- Labs including blood cell count and hemoglobin are normal. Urine protein is normal. Need to proceed with MRI Have entered order .

## 2019-03-29 NOTE — Telephone Encounter (Signed)
lmtcb

## 2019-03-29 NOTE — Telephone Encounter (Signed)
Patient advised as below. Patient reports she is having a VNG test on 04/09/2019. Patient wants to know if this will intervene with MRI. Please advise

## 2019-03-29 NOTE — Telephone Encounter (Signed)
no

## 2019-03-29 NOTE — Telephone Encounter (Signed)
Patient advised as below.  

## 2019-03-30 ENCOUNTER — Telehealth: Payer: Self-pay | Admitting: Family Medicine

## 2019-03-30 NOTE — Telephone Encounter (Signed)
Patient daughter was advised. KW

## 2019-03-30 NOTE — Telephone Encounter (Signed)
The mri can wait until after VNG

## 2019-03-30 NOTE — Telephone Encounter (Signed)
Spoke with patient on the phone she states that she was not given instructions for the day of appointment. Patient is also wanting to know since MRI is with contrast if she is allowed to eat or drink the day of MRI? KW

## 2019-03-30 NOTE — Telephone Encounter (Signed)
Pt called saying the test that Dr. Pryor Ochoa scheduled for Oct 2 will need to be done prior to the MRI that Dr. Caryn Section wants to order.  This is per her insurance.  CB#  [phone]  Con Memos

## 2019-03-30 NOTE — Telephone Encounter (Signed)
Patient states that Dr. Pryor Ochoa had scheduled her to have a VNG on 04/09/19 and insurance will not pay for MRI if she gets that done before the procedure scheduled on 04/09/19. Patient wants to know if you still would like her to have MRi done since it is scheduled sooner? Or is it okay for her to wait to get MRI done? Please advise. KW

## 2019-03-30 NOTE — Telephone Encounter (Signed)
Patients daughter had concerns about coverage for procedure and diagnosis code, she was referred back to referral department to review.KW

## 2019-03-30 NOTE — Telephone Encounter (Signed)
Pt called needing more info on her scheduled MRI  CB#  [phone]  Con Memos

## 2019-03-30 NOTE — Telephone Encounter (Signed)
Pt's daughter needing to discuss upcoming procedure with Dr. Maralyn Sago nurse.  Thanks, [employer]

## 2019-03-31 ENCOUNTER — Ambulatory Visit: Payer: Medicare Other

## 2019-03-31 ENCOUNTER — Telehealth: Payer: Self-pay

## 2019-03-31 DIAGNOSIS — I10 Essential (primary) hypertension: Secondary | ICD-10-CM

## 2019-03-31 NOTE — Telephone Encounter (Signed)
She should change fosinopril to 10mg  tablets, one tablet daily at bedtime, #30, rf x 0  Continue hctz 1/2 of 25mg  tablet every morning.  Call back in a week to let us know how bp is running and if dizziness is any better.

## 2019-03-31 NOTE — Telephone Encounter (Signed)
I called patient to cancel an upcomming appointment for Friday 09/25. Patient wanted me to let Dr. Caryn Section know that she thinks the dizziness is coming from her taking the blood pressure pill. She says the dizziness worsens shortly after taking it, and her blood pressure drops as low as 90/50.

## 2019-04-01 MED ORDER — FOSINOPRIL SODIUM 10 MG PO TABS: 10.0000 mg | ORAL_TABLET | Freq: Every day | ORAL | 0 refills | Status: DC

## 2019-04-01 NOTE — Telephone Encounter (Signed)
Pt called back.  Please call her back she had other questions  Con Memos

## 2019-04-01 NOTE — Telephone Encounter (Signed)
Patient advised and agreed with treatment plan. New prescription sent into pharmacy.

## 2019-04-01 NOTE — Addendum Note (Signed)
Addended by: Randal Buba on: 04/01/2019 04:40 PM   Modules accepted: Orders

## 2019-04-01 NOTE — Telephone Encounter (Signed)
Tried calling patient. Left message to call back. When patient calls back she needs to be advised as below, and also advised that we should cancel her [place] appointment for Friday 09/25 per Dr. Caryn Section.

## 2019-04-02 ENCOUNTER — Ambulatory Visit: Payer: Medicare Other | Admitting: Family Medicine

## 2019-04-09 DIAGNOSIS — R42 Dizziness and giddiness: Secondary | ICD-10-CM | POA: Diagnosis not present

## 2019-04-12 DIAGNOSIS — H269 Unspecified cataract: Secondary | ICD-10-CM | POA: Diagnosis not present

## 2019-04-12 DIAGNOSIS — H04129 Dry eye syndrome of unspecified lacrimal gland: Secondary | ICD-10-CM | POA: Diagnosis not present

## 2019-04-12 DIAGNOSIS — E113299 Type 2 diabetes mellitus with mild nonproliferative diabetic retinopathy without macular edema, unspecified eye: Secondary | ICD-10-CM | POA: Diagnosis not present

## 2019-04-14 ENCOUNTER — Telehealth: Payer: Self-pay | Admitting: Family Medicine

## 2019-04-14 NOTE — Telephone Encounter (Signed)
Please review. KW 

## 2019-04-14 NOTE — Telephone Encounter (Signed)
Pt is needing to know the status on her ENT referral.  Please call pt back at [phone] to let her know.  Thanks, [employer]

## 2019-04-15 ENCOUNTER — Other Ambulatory Visit: Payer: Self-pay | Admitting: Otolaryngology

## 2019-04-15 DIAGNOSIS — H6121 Impacted cerumen, right ear: Secondary | ICD-10-CM | POA: Diagnosis not present

## 2019-04-15 DIAGNOSIS — R42 Dizziness and giddiness: Secondary | ICD-10-CM | POA: Diagnosis not present

## 2019-04-17 ENCOUNTER — Encounter: Payer: Self-pay | Admitting: Emergency Medicine

## 2019-04-17 ENCOUNTER — Inpatient Hospital Stay: Payer: Medicare Other

## 2019-04-17 ENCOUNTER — Inpatient Hospital Stay
Admission: EM | Admit: 2019-04-17 | Discharge: 2019-04-18 | DRG: 641 | Disposition: A | Discharge status: Home / Self Care | Payer: Medicare Other | Attending: Internal Medicine | Admitting: Internal Medicine

## 2019-04-17 ENCOUNTER — Other Ambulatory Visit: Payer: Self-pay

## 2019-04-17 DIAGNOSIS — T502X5A Adverse effect of carbonic-anhydrase inhibitors, benzothiadiazides and other diuretics, initial encounter: Secondary | ICD-10-CM | POA: Diagnosis present

## 2019-04-17 DIAGNOSIS — Z7951 Long term (current) use of inhaled steroids: Secondary | ICD-10-CM | POA: Diagnosis not present

## 2019-04-17 DIAGNOSIS — Z833 Family history of diabetes mellitus: Secondary | ICD-10-CM

## 2019-04-17 DIAGNOSIS — E871 Hypo-osmolality and hyponatremia: Principal | ICD-10-CM | POA: Diagnosis present

## 2019-04-17 DIAGNOSIS — Z79899 Other long term (current) drug therapy: Secondary | ICD-10-CM

## 2019-04-17 DIAGNOSIS — D649 Anemia, unspecified: Secondary | ICD-10-CM | POA: Diagnosis present

## 2019-04-17 DIAGNOSIS — R200 Anesthesia of skin: Secondary | ICD-10-CM | POA: Diagnosis not present

## 2019-04-17 DIAGNOSIS — Z791 Long term (current) use of non-steroidal anti-inflammatories (NSAID): Secondary | ICD-10-CM | POA: Diagnosis not present

## 2019-04-17 DIAGNOSIS — H6690 Otitis media, unspecified, unspecified ear: Secondary | ICD-10-CM | POA: Diagnosis present

## 2019-04-17 DIAGNOSIS — R42 Dizziness and giddiness: Secondary | ICD-10-CM

## 2019-04-17 DIAGNOSIS — Z8249 Family history of ischemic heart disease and other diseases of the circulatory system: Secondary | ICD-10-CM

## 2019-04-17 DIAGNOSIS — I10 Essential (primary) hypertension: Secondary | ICD-10-CM | POA: Diagnosis not present

## 2019-04-17 DIAGNOSIS — Z7984 Long term (current) use of oral hypoglycemic drugs: Secondary | ICD-10-CM | POA: Diagnosis not present

## 2019-04-17 DIAGNOSIS — E119 Type 2 diabetes mellitus without complications: Secondary | ICD-10-CM | POA: Diagnosis present

## 2019-04-17 DIAGNOSIS — Z20828 Contact with and (suspected) exposure to other viral communicable diseases: Secondary | ICD-10-CM | POA: Diagnosis present

## 2019-04-17 LAB — BASIC METABOLIC PANEL
Anion gap: 8 (ref 5–15)
BUN: 24 mg/dL — ABNORMAL HIGH (ref 8–23)
CO2: 26 mmol/L (ref 22–32)
Calcium: 9.3 mg/dL (ref 8.9–10.3)
Chloride: 90 mmol/L — ABNORMAL LOW (ref 98–111)
Creatinine, Ser: 0.97 mg/dL (ref 0.44–1.00)
GFR calc Af Amer: >60 mL/min (ref >60)
GFR calc non Af Amer: >60 mL/min (ref >60)
Glucose, Bld: 99 mg/dL (ref 70–99)
Potassium: 4.2 mmol/L (ref 3.5–5.1)
Sodium: 124 mmol/L — ABNORMAL LOW (ref 135–145)

## 2019-04-17 LAB — URINALYSIS, COMPLETE (UACMP) WITH MICROSCOPIC
Bacteria, UA: NONE SEEN
Bilirubin Urine: NEGATIVE
Glucose, UA: NEGATIVE mg/dL
Hgb urine dipstick: NEGATIVE
Ketones, ur: NEGATIVE mg/dL
Nitrite: NEGATIVE
Protein, ur: NEGATIVE mg/dL
Specific Gravity, Urine: 1.002 — ABNORMAL LOW (ref 1.005–1.030)
pH: 7 (ref 5.0–8.0)

## 2019-04-17 LAB — CBC
HCT: 31 % — ABNORMAL LOW (ref 36.0–46.0)
Hemoglobin: 10.4 g/dL — ABNORMAL LOW (ref 12.0–15.0)
MCH: 29.9 pg (ref 26.0–34.0)
MCHC: 33.5 g/dL (ref 30.0–36.0)
MCV: 89.1 fL (ref 80.0–100.0)
Platelets: 253 10*3/uL (ref 150–400)
RBC: 3.48 MIL/uL — ABNORMAL LOW (ref 3.87–5.11)
RDW: 13.5 % (ref 11.5–15.5)
WBC: 5.3 10*3/uL (ref 4.0–10.5)
nRBC: 0 % (ref 0.0–0.2)

## 2019-04-17 LAB — GLUCOSE, CAPILLARY: Glucose-Capillary: 94 mg/dL (ref 70–99)

## 2019-04-17 MED ORDER — INSULIN ASPART 100 UNIT/ML ~~LOC~~ SOLN: 0.0000 [IU] | Freq: Three times a day (TID) | SUBCUTANEOUS | Status: DC

## 2019-04-17 MED ORDER — ACETAMINOPHEN 325 MG PO TABS: 650.0000 mg | ORAL_TABLET | Freq: Four times a day (QID) | ORAL | Status: DC | PRN

## 2019-04-17 MED ORDER — SODIUM CHLORIDE 0.9% FLUSH: 3.0000 mL | Freq: Once | INTRAVENOUS | Status: DC

## 2019-04-17 MED ORDER — METFORMIN HCL 500 MG PO TABS
1000.0000 mg | ORAL_TABLET | Freq: Every day | ORAL | Status: DC
  Administered 2019-04-18: 1000 mg via ORAL
  Filled 2019-04-17: qty 2

## 2019-04-17 MED ORDER — ENOXAPARIN SODIUM 40 MG/0.4ML ~~LOC~~ SOLN: 40.0000 mg | SUBCUTANEOUS | Status: DC

## 2019-04-17 MED ORDER — ADULT MULTIVITAMIN W/MINERALS CH
1.0000 | ORAL_TABLET | Freq: Every day | ORAL | Status: DC
  Administered 2019-04-18: 1 via ORAL
  Filled 2019-04-17: qty 1

## 2019-04-17 MED ORDER — SODIUM CHLORIDE 0.9 % IV SOLN
INTRAVENOUS | Status: DC
  Administered 2019-04-17: 23:00:00 via INTRAVENOUS

## 2019-04-17 MED ORDER — ONDANSETRON HCL 4 MG PO TABS: 4.0000 mg | ORAL_TABLET | Freq: Four times a day (QID) | ORAL | Status: DC | PRN

## 2019-04-17 MED ORDER — LINAGLIPTIN 5 MG PO TABS
5.0000 mg | ORAL_TABLET | Freq: Every day | ORAL | Status: DC
  Filled 2019-04-17 (×2): qty 1

## 2019-04-17 MED ORDER — ZINC SULFATE 220 (50 ZN) MG PO CAPS
220.0000 mg | ORAL_CAPSULE | Freq: Every day | ORAL | Status: DC
  Administered 2019-04-18: 220 mg via ORAL
  Filled 2019-04-17: qty 1

## 2019-04-17 MED ORDER — ACETAMINOPHEN 650 MG RE SUPP: 650.0000 mg | Freq: Four times a day (QID) | RECTAL | Status: DC | PRN

## 2019-04-17 MED ORDER — VITAMIN C 500 MG PO TABS
500.0000 mg | ORAL_TABLET | Freq: Every day | ORAL | Status: DC
  Administered 2019-04-18: 500 mg via ORAL
  Filled 2019-04-17: qty 1

## 2019-04-17 MED ORDER — ONDANSETRON HCL 4 MG/2ML IJ SOLN: 4.0000 mg | Freq: Four times a day (QID) | INTRAMUSCULAR | Status: DC | PRN

## 2019-04-17 MED ORDER — INSULIN ASPART 100 UNIT/ML ~~LOC~~ SOLN: 0.0000 [IU] | Freq: Every day | SUBCUTANEOUS | Status: DC

## 2019-04-17 MED ORDER — LISINOPRIL 10 MG PO TABS
10.0000 mg | ORAL_TABLET | Freq: Every day | ORAL | Status: DC
  Administered 2019-04-17: 10 mg via ORAL
  Filled 2019-04-17: qty 1

## 2019-04-17 NOTE — ED Notes (Signed)
Dr Jessup at bedside 

## 2019-04-17 NOTE — ED Notes (Signed)
Dr Wieting at bedside 

## 2019-04-17 NOTE — ED Provider Notes (Signed)
[facility] Emergency Department Provider Note   ____________________________________________   First MD Initiated Contact with Patient 04/17/19 1750     (approximate)  I have reviewed the triage vital signs and the nursing notes.   HISTORY  Chief Complaint Weakness and Hypertension    HPI Natasha Phillips is a 68 y.o. female with past medical history of hypertension and diabetes who presents to the ED complaining of dizziness and weakness.  Patient reports that over the past couple of months she has had intermittent episodes of feeling like the room is spinning around her.  She also reports feeling lightheaded at times, but denies any chest pain or shortness of breath.  She had a similar episode again today and reports both of her lower extremities feeling "heavy" but not weak, with some numbness and tingling around her mouth.  This is since improved and she denies any numbness or weakness at this time.  She states she is otherwise been feeling well with no fevers, chills, chest pain, or shortness of breath.  She was recently seen by her PCP for the symptoms and had her hydrochlorothiazide decreased.        Past Medical History:  Diagnosis Date  . Anemia   . Diabetes mellitus without complication (HCC)   . Hypertension     Patient Active Problem List   Diagnosis Date Noted  . Gastroesophageal reflux disease 09/16/2018  . Erosion of oral mucosa 10/28/2016  . Thyroid nodule 10/14/2015  . Abnormal cervical Papanicolaou smear 01/11/2015  . Absolute anemia 01/11/2015  . Mild mitral insufficiency 01/11/2015  . CN (constipation) 01/11/2015  . Calcium blood increased 01/11/2015  . LBP (low back pain) 01/11/2015  . Adiposity 01/11/2015  . Leg paresthesia 01/11/2015  . Neuralgia neuritis, sciatic nerve 01/11/2015  . Avitaminosis D 01/11/2015  . Essential (primary) hypertension 11/22/2014  . Controlled type 2 diabetes mellitus without complication (HCC)  11/22/2014    Past Surgical History:  Procedure Laterality Date  . BIOPSY THYROID    . CESAREAN SECTION     1981 1988  . TUBAL LIGATION      Prior to Admission medications   Medication Sig Start Date End Date Taking? Authorizing Provider  fluticasone (FLONASE) 50 MCG/ACT nasal spray Place 2 sprays into both nostrils daily. 09/09/17   Malva LimesFisher, Donald E, MD  fosinopril (MONOPRIL) 10 MG tablet Take 1 tablet (10 mg total) by mouth at bedtime. 04/01/19   Malva LimesFisher, Donald E, MD  hydrochlorothiazide (HYDRODIURIL) 25 MG tablet Take 0.5 tablets (12.5 mg total) by mouth daily. 03/11/19   Trey SailorsPollak, Adriana M, PA-C  meloxicam (MOBIC) 15 MG tablet Take 1 tablet (15 mg total) by mouth daily. Patient taking differently: Take 15 mg by mouth as needed.  06/22/15   Lorie PhenixMaloney, Nancy, MD  metFORMIN (GLUCOPHAGE) 1000 MG tablet Take 1 tablet (1,000 mg total) by mouth daily. 09/14/18   Malva LimesFisher, Donald E, MD  MULTIPLE VITAMINS-CALCIUM PO Take 1 tablet by mouth daily.     [provider]  omeprazole (PRILOSEC) 40 MG capsule TAKE 1 CAPSULE(40 MG) BY MOUTH DAILY 11/23/18   Malva LimesFisher, Donald E, MD  pioglitazone (ACTOS) 45 MG tablet Take 1 tablet (45 mg total) by mouth daily. 09/14/18   Malva LimesFisher, Donald E, MD  saxagliptin HCl (ONGLYZA) 5 MG TABS tablet Take 1 tablet (5 mg total) by mouth daily. 09/14/18   Malva LimesFisher, Donald E, MD    Allergies Januvia [sitagliptin] and Sulfa antibiotics  Family History  Problem Relation Age  of Onset  . Diabetes Mother   . Heart failure Mother   . Diabetes Sister   . Healthy Sister   . Healthy Sister   . Healthy Sister   . Diabetes Brother   . Diabetes Sister   . Heart failure Father   . Brain cancer Brother   . Throat cancer Brother   . Glaucoma Other        Runs on Paternal Side  . Breast cancer Cousin     Social History Social History   Tobacco Use  . Smoking status: Never Smoker  . Smokeless tobacco: Never Used  Substance Use Topics  . Alcohol use: No  . Drug use: No     Review of Systems  Constitutional: No fever/chills Eyes: No visual changes. ENT: No sore throat. Cardiovascular: Denies chest pain. Respiratory: Denies shortness of breath. Gastrointestinal: No abdominal pain.  No nausea, no vomiting.  No diarrhea.  No constipation. Genitourinary: Negative for dysuria. Musculoskeletal: Negative for back pain. Skin: Negative for rash. Neurological: Negative for headaches, focal weakness or numbness.  Positive for dizziness.  ____________________________________________   PHYSICAL EXAM:  VITAL SIGNS: ED Triage Vitals  Enc Vitals Group     BP 04/17/19 1612 (!) 185/68     Pulse Rate 04/17/19 1612 85     Resp 04/17/19 1612 16     Temp 04/17/19 1612 98.5 F (36.9 C)     Temp Source 04/17/19 1612 Oral     SpO2 04/17/19 1612 99 %     Weight 04/17/19 1618 164 lb (74.4 kg)     Height 04/17/19 1618 5\' 1"  (1.549 m)     Head Circumference --      Peak Flow --      Pain Score 04/17/19 1618 0     Pain Loc --      Pain Edu? --      Excl. in GC? --     Constitutional: Alert and oriented. Eyes: Conjunctivae are normal. Head: Atraumatic. Nose: No congestion/rhinnorhea. Mouth/Throat: Mucous membranes are moist. Neck: Normal ROM Cardiovascular: Normal rate, regular rhythm. Grossly normal heart sounds. Respiratory: Normal respiratory effort.  No retractions. Lungs CTAB. Gastrointestinal: Soft and nontender. No distention. Genitourinary: deferred Musculoskeletal: No lower extremity tenderness nor edema. Neurologic:  Normal speech and language. No gross focal neurologic deficits are appreciated. Skin:  Skin is warm, dry and intact. No rash noted. Psychiatric: Mood and affect are normal. Speech and behavior are normal.  ____________________________________________   LABS (all labs ordered are listed, but only abnormal results are displayed)  Labs Reviewed  BASIC METABOLIC PANEL - Abnormal; Notable for the following components:      Result Value    Sodium 124 (*)    Chloride 90 (*)    BUN 24 (*)    All other components within normal limits  CBC - Abnormal; Notable for the following components:   RBC 3.48 (*)    Hemoglobin 10.4 (*)    HCT 31.0 (*)    All other components within normal limits  URINALYSIS, COMPLETE (UACMP) WITH MICROSCOPIC - Abnormal; Notable for the following components:   Color, Urine STRAW (*)    APPearance CLEAR (*)    Specific Gravity, Urine 1.002 (*)    Leukocytes,Ua TRACE (*)    All other components within normal limits  SARS CORONAVIRUS 2 (TAT 6-24 HRS)   ____________________________________________  EKG  ED ECG REPORT I, 06/17/19, the attending physician, personally viewed and interpreted this ECG.   Date:  04/17/2019  EKG Time: 16:23  Rate: 85  Rhythm: normal sinus rhythm  Axis: Normal  Intervals:none  ST&T Change: None    PROCEDURES  Procedure(s) performed (including Critical Care):  Procedures   ____________________________________________   INITIAL IMPRESSION / ASSESSMENT AND PLAN / ED COURSE       68 year old female presents to the ED with about 2 months of intermittent dizziness and feeling like the room is spinning around her.  She had some heaviness in both lower extremities as well as tingling around her mouth earlier today.  Her neurologic exam is currently benign and nonfocal.  Given bilateral symptoms today, low suspicion for acute stroke.  Labs are significant for hyponatremia, which may be contributing to her symptoms.  She admits to drinking significant amounts of water, also takes hydrochlorothiazide.  With sodium of 124, she would benefit from admission.  Case discussed with hospitalist, who accepts patient for admission.      ____________________________________________   FINAL CLINICAL IMPRESSION(S) / ED DIAGNOSES  Final diagnoses:  Hyponatremia  Dizziness     ED Discharge Orders    None       Note:  This document was prepared using Dragon voice  recognition software and may include unintentional dictation errors.   Blake Divine, MD 04/17/19 [phone]

## 2019-04-17 NOTE — ED Notes (Signed)
MRI at bedside to do screening

## 2019-04-17 NOTE — ED Triage Notes (Signed)
States has had episodes of hypertension and feeling of legs being heavy today. No unilateral weakness. Similar episode one week ago. States blood pressure medication decreased in September.

## 2019-04-17 NOTE — H&P (Signed)
[place] at [place] NAME: Natasha Phillips    MR#:  [id number]  DATE OF BIRTH:  Jan 16, 1951  DATE OF ADMISSION:  04/17/2019  PRIMARY CARE PHYSICIAN: Birdie Sons, MD   REQUESTING/REFERRING PHYSICIAN: Dr Blake Divine  CHIEF COMPLAINT:   Chief Complaint  Patient presents with  . Weakness  . Hypertension    HISTORY OF PRESENT ILLNESS:  Natasha Phillips  is a 68 y.o. female with a known history of hypertension presents to the hospital with heaviness in her legs and numbness around both sides of her mouth.  She has been having dizziness going on for months.  She was recently treated with antibiotics 2 rounds and ended some vestibular testing.  Her blood pressures been up and down and they recently cut back on the dosages of her fosinopril and hydrochlorothiazide.  She did not take her hydrochlorothiazide this morning but then took it on the way to the emergency room.  In the ER she was found to have a low sodium and hospitalist services were contacted for further evaluation.  PAST MEDICAL HISTORY:   Past Medical History:  Diagnosis Date  . Anemia   . Diabetes mellitus without complication ([place])   . Hypertension     PAST SURGICAL HISTORY:   Past Surgical History:  Procedure Laterality Date  . BIOPSY THYROID    . [place]  . TUBAL LIGATION      SOCIAL HISTORY:   Social History   Tobacco Use  . Smoking status: Never Smoker  . Smokeless tobacco: Never Used  Substance Use Topics  . Alcohol use: No    FAMILY HISTORY:   Family History  Problem Relation Age of Onset  . Diabetes Mother   . Heart failure Mother   . Diabetes Sister   . Healthy Sister   . Healthy Sister   . Healthy Sister   . Diabetes Brother   . Diabetes Sister   . Heart failure Father   . Brain cancer Brother   . Throat cancer Brother   . Glaucoma Other        Runs on Paternal Side  . Breast cancer Cousin     DRUG  ALLERGIES:   Allergies  Allergen Reactions  . Januvia [Sitagliptin] Diarrhea  . Sulfa Antibiotics     REVIEW OF SYSTEMS:  CONSTITUTIONAL: No fever, positive chills the other month.  Positive for weight loss.Marland Kitchen  EYES: Some blurred vision but did see the eye doctor yesterday. EARS, NOSE, AND THROAT: No tinnitus or ear pain. No sore throat RESPIRATORY: No cough, shortness of breath, wheezing or hemoptysis.  CARDIOVASCULAR: No chest pain, orthopnea, edema.  GASTROINTESTINAL: No nausea, vomiting, diarrhea or abdominal pain. No blood in bowel movements GENITOURINARY: No dysuria, hematuria.  ENDOCRINE: No polyuria, nocturia,  HEMATOLOGY: No anemia, easy bruising or bleeding SKIN: No rash or lesion. MUSCULOSKELETAL: No joint pain or arthritis.   NEUROLOGIC: No tingling, numbness, weakness.  PSYCHIATRY: No anxiety or depression.   MEDICATIONS AT HOME:   Prior to Admission medications   Medication Sig Start Date End Date Taking? Authorizing Provider  fosinopril (MONOPRIL) 10 MG tablet Take 1 tablet (10 mg total) by mouth at bedtime. 04/01/19   Birdie Sons, MD  hydrochlorothiazide (HYDRODIURIL) 25 MG tablet Take 0.5 tablets (12.5 mg total) by mouth daily. 03/11/19   Trinna Post, PA-C  metFORMIN (GLUCOPHAGE) 1000 MG tablet Take 1 tablet (1,000 mg total) by mouth  daily. 09/14/18   Malva Limes, MD  pioglitazone (ACTOS) 45 MG tablet Take 1 tablet (45 mg total) by mouth daily. 09/14/18   Malva Limes, MD  saxagliptin HCl (ONGLYZA) 5 MG TABS tablet Take 1 tablet (5 mg total) by mouth daily. 09/14/18   Malva Limes, MD   Medication reconciliation still undergoing.  VITAL SIGNS:  Blood pressure (!) 185/68, pulse 85, temperature 98.5 F (36.9 C), temperature source Oral, resp. rate 16, height 5\' 1"  (1.549 m), weight 74.4 kg, SpO2 99 %.  PHYSICAL EXAMINATION:  GENERAL:  68 y.o.-year-old patient lying in the bed with no acute distress.  EYES: Pupils equal, round, reactive to light  and accommodation. No scleral icterus. Extraocular muscles intact.  HEENT: Head atraumatic, normocephalic. Oropharynx and nasopharynx clear.  NECK:  Supple, no jugular venous distention. No thyroid enlargement, no tenderness.  LUNGS: Normal breath sounds bilaterally, no wheezing, rales,rhonchi or crepitation. No use of accessory muscles of respiration.  CARDIOVASCULAR: S1, S2 normal. No murmurs, rubs, or gallops.  ABDOMEN: Soft, nontender, nondistended. Bowel sounds present. No organomegaly or mass.  EXTREMITIES: No pedal edema, cyanosis, or clubbing.  NEUROLOGIC: Cranial nerves II through XII are intact. Muscle strength 5/5 in all extremities. Sensation intact. Gait not checked.  Unable to bring up on dizziness with moving the patient around. PSYCHIATRIC: The patient is alert and oriented x 3.  SKIN: No rash, lesion, or ulcer.   LABORATORY PANEL:   CBC Recent Labs  Lab 04/17/19 1622  WBC 5.3  HGB 10.4*  HCT 31.0*  PLT 253   ------------------------------------------------------------------------------------------------------------------  Chemistries  Recent Labs  Lab 04/17/19 1622  NA 124*  K 4.2  CL 90*  CO2 26  GLUCOSE 99  BUN 24*  CREATININE 0.97  CALCIUM 9.3   ------------------------------------------------------------------------------------------------------------------     EKG:   Normal sinus rhythm 85 bpm.  IMPRESSION AND PLAN:   1.  Hyponatremia.  Patient states that she has been drinking some water.  I will stop hydrochlorothiazide.  Gentle IV fluid hydration. 2.  Dizziness.  Check orthostatic vital signs.  I was unable to bring on the patient's dizziness.  Patient had 2 rounds of antibiotics for ear infection.  Tympanic membrane looks normal at this point I do not think this is an ear infection.  Patient states that she had outpatient vestibular testing which came back okay.  Will MRI of the brain.  The patient refused contrast for the MRI so I am unable  to get the MRI that I wanted to. 3.  Hypertension.  Continue fosinopril.  Check orthostatic vital signs.  Stop hydrochlorothiazide. 4.  Type 2 diabetes mellitus.  Continue oral medications.  Looking back at a hemoglobin A1c back in July it was low at 5.9.  May be able to cut back on oral medication dosages. 5.  Anemia.  Hemoglobin 10.4.  Continue to follow as outpatient.    All the records are reviewed and case discussed with ED provider. Management plans discussed with the patient, and she is in agreement.  CODE STATUS: full code  TOTAL TIME TAKING CARE OF THIS PATIENT: 50 minutes.    August M.D on 04/17/2019 at 6:35 PM  Between 7am to 6pm - Pager - [phone]  After 6pm call admission pager [phone]  [facility] Office  [phone]  CC: Primary care physician; [phone], MD

## 2019-04-18 LAB — BASIC METABOLIC PANEL
Anion gap: 7 (ref 5–15)
Anion gap: 7 (ref 5–15)
BUN: 22 mg/dL (ref 8–23)
BUN: 22 mg/dL (ref 8–23)
CO2: 26 mmol/L (ref 22–32)
CO2: 28 mmol/L (ref 22–32)
Calcium: 9.3 mg/dL (ref 8.9–10.3)
Calcium: 10.1 mg/dL (ref 8.9–10.3)
Chloride: 99 mmol/L (ref 98–111)
Chloride: 102 mmol/L (ref 98–111)
Creatinine, Ser: 1.02 mg/dL — ABNORMAL HIGH (ref 0.44–1.00)
Creatinine, Ser: 1.03 mg/dL — ABNORMAL HIGH (ref 0.44–1.00)
GFR calc Af Amer: >60 mL/min (ref >60)
GFR calc Af Amer: >60 mL/min (ref >60)
GFR calc non Af Amer: 57 mL/min — ABNORMAL LOW (ref >60)
GFR calc non Af Amer: 57 mL/min — ABNORMAL LOW (ref >60)
Glucose, Bld: 91 mg/dL (ref 70–99)
Glucose, Bld: 87 mg/dL (ref 70–99)
Potassium: 4 mmol/L (ref 3.5–5.1)
Potassium: 4.4 mmol/L (ref 3.5–5.1)
Sodium: 132 mmol/L — ABNORMAL LOW (ref 135–145)
Sodium: 137 mmol/L (ref 135–145)

## 2019-04-18 LAB — CBC
HCT: 28.7 % — ABNORMAL LOW (ref 36.0–46.0)
Hemoglobin: 9.8 g/dL — ABNORMAL LOW (ref 12.0–15.0)
MCH: 29.8 pg (ref 26.0–34.0)
MCHC: 34.1 g/dL (ref 30.0–36.0)
MCV: 87.2 fL (ref 80.0–100.0)
Platelets: 238 10*3/uL (ref 150–400)
RBC: 3.29 MIL/uL — ABNORMAL LOW (ref 3.87–5.11)
RDW: 13.3 % (ref 11.5–15.5)
WBC: 5 10*3/uL (ref 4.0–10.5)
nRBC: 0 % (ref 0.0–0.2)

## 2019-04-18 LAB — GLUCOSE, CAPILLARY
Glucose-Capillary: 89 mg/dL (ref 70–99)
Glucose-Capillary: 91 mg/dL (ref 70–99)
Glucose-Capillary: 80 mg/dL (ref 70–99)

## 2019-04-18 LAB — SARS CORONAVIRUS 2 (TAT 6-24 HRS): SARS Coronavirus 2: NEGATIVE

## 2019-04-18 LAB — HIV ANTIBODY (ROUTINE TESTING W REFLEX): HIV Screen 4th Generation wRfx: NONREACTIVE

## 2019-04-18 NOTE — Progress Notes (Signed)
[place] at [place] NAME: Natasha Phillips    MR#:  [id number]  DATE OF BIRTH:  11-03-1950  SUBJECTIVE: Patient is feeling better, says that she does not have heaviness in the legs anymore, sodium up to 132.  CHIEF COMPLAINT:   Chief Complaint  Patient presents with  . Weakness  . Hypertension    REVIEW OF SYSTEMS:   ROS CONSTITUTIONAL: No fever, fatigue or weakness.  EYES: No blurred or double vision.  EARS, NOSE, AND THROAT: No tinnitus or ear pain.  RESPIRATORY: No cough, shortness of breath, wheezing or hemoptysis.  CARDIOVASCULAR: No chest pain, orthopnea, edema.  GASTROINTESTINAL: No nausea, vomiting, diarrhea or abdominal pain.  GENITOURINARY: No dysuria, hematuria.  ENDOCRINE: No polyuria, nocturia,  HEMATOLOGY: No anemia, easy bruising or bleeding SKIN: No rash or lesion. MUSCULOSKELETAL: No joint pain or arthritis.   NEUROLOGIC: No tingling, numbness, weakness.  PSYCHIATRY: No anxiety or depression.   DRUG ALLERGIES:   Allergies  Allergen Reactions  . Januvia [Sitagliptin] Diarrhea  . Sulfa Antibiotics     VITALS:  Blood pressure (!) 112/58, pulse 82, temperature 98.5 F (36.9 C), temperature source Oral, resp. rate 20, height 5\' 1"  (1.549 m), weight 74.4 kg, SpO2 100 %.  PHYSICAL EXAMINATION:  GENERAL:  68 y.o.-year-old patient lying in the bed with no acute distress.  EYES: Pupils equal, round, reactive to light and accommodation. No scleral icterus. Extraocular muscles intact.  HEENT: Head atraumatic, normocephalic. Oropharynx and nasopharynx clear.  NECK:  Supple, no jugular venous distention. No thyroid enlargement, no tenderness.  LUNGS: Normal breath sounds bilaterally, no wheezing, rales,rhonchi or crepitation. No use of accessory muscles of respiration.  CARDIOVASCULAR: S1, S2 normal. No murmurs, rubs, or gallops.  ABDOMEN: Soft, nontender, nondistended. Bowel sounds present. No organomegaly or mass.   EXTREMITIES: No pedal edema, cyanosis, or clubbing.  NEUROLOGIC: Cranial nerves II through XII are intact. Muscle strength 5/5 in all extremities. Sensation intact. Gait not checked.  PSYCHIATRIC: The patient is alert and oriented x 3.  SKIN: No obvious rash, lesion, or ulcer.    LABORATORY PANEL:   CBC Recent Labs  Lab 04/18/19 0448  WBC 5.0  HGB 9.8*  HCT 28.7*  PLT 238   ------------------------------------------------------------------------------------------------------------------  Chemistries  Recent Labs  Lab 04/18/19 0448  NA 132*  K 4.0  CL 99  CO2 26  GLUCOSE 91  BUN 22  CREATININE 1.02*  CALCIUM 9.3   ------------------------------------------------------------------------------------------------------------------  Cardiac Enzymes No results for input(s): TROPONINI in the last 168 hours. ------------------------------------------------------------------------------------------------------------------  RADIOLOGY:  Mr Brain Wo Contrast  Result Date: 04/17/2019 CLINICAL DATA:  Heaviness in legs and numbness around the mouth. Dizziness for months. History of hypertension. EXAM: MRI HEAD WITHOUT CONTRAST TECHNIQUE: Multiplanar, multiecho pulse sequences of the brain and surrounding structures were obtained without intravenous contrast. COMPARISON:  None. FINDINGS: Brain: No evidence for acute infarction, hemorrhage, mass lesion, hydrocephalus, or extra-axial fluid. Normal for age cerebral volume. Mild subcortical and periventricular T2 and FLAIR hyperintensities, likely chronic microvascular ischemic change. Vascular: Flow voids are maintained throughout the carotid, basilar, and vertebral arteries. There are no areas of chronic hemorrhage. Skull and upper cervical spine: Unremarkable visualized calvarium, skullbase, and cervical vertebrae. Pituitary, pineal, cerebellar tonsils unremarkable. No upper cervical cord lesions. Sinuses/Orbits: No orbital masses or  proptosis. Globes appear symmetric. Sinuses appear well aerated, without evidence for air-fluid level. Other: No nasopharyngeal pathology or mastoid fluid. Scalp and other visualized extracranial soft tissues grossly unremarkable. IMPRESSION:  1. Mild subcortical and periventricular T2 and FLAIR hyperintensities, likely chronic microvascular ischemic change. 2. No acute intracranial findings. Electronically Signed   By: Elsie Stain M.D.   On: 04/17/2019 21:30    EKG:   Orders placed or performed during the hospital encounter of 04/17/19  . ED EKG  . ED EKG    ASSESSMENT AND PLAN:   #1 symptomatic hyponatremia likely due to diuretics, discontinue hydrochlorothiazide received IV fluids, sodium improved from 1 24-1 32 today.  Recheck sodium around lunchtime, if sodium is around 132 or more discharge the patient home, spoke to patient, patient's children over the phone as well. 2.  Essential hypertension, continue fosinopril, discontinue HCTZ due to hyponatremia, patient needs to see PCP next week either Tuesday or Wednesday to recheck the sodium and adjust the blood pressure medication.  Diabetes mellitus type 2: Continue oral diabetics.   All the records are reviewed and case discussed with Care Management/Social Workerr. Management plans discussed with the patient, family and they are in agreement.  CODE STATUS: Full code  TOTAL TIME TAKING CARE OF THIS PATIENT: 35 minutes.   POSSIBLE D/C later today Katha Hamming M.D on 04/18/2019 at 10:06 AM  Between 7am to 6pm - Pager - [phone]  After 6pm go to [email or web address] - password [email or web address]  [facility]  Office  [phone]  CC: Primary care physician; Malva Limes, MD   Note: This dictation was prepared with Dragon dictation along with smaller phrase technology. Any transcriptional errors that result from this process are unintentional.

## 2019-04-18 NOTE — Progress Notes (Signed)
Recovered sooner thn expected.discharge home D/c hctz at [facility],spoke to ptatient

## 2019-04-18 NOTE — Progress Notes (Signed)
Natasha Phillips to be D/C'd Home per MD order.  Discussed prescriptions and follow up appointments with the patient. Prescriptions given to patient, medication list explained in detail. Pt verbalized understanding.  Allergies as of 04/18/2019      Reactions   Januvia [sitagliptin] Diarrhea   Sulfa Antibiotics       Medication List    STOP taking these medications   hydrochlorothiazide 25 MG tablet Commonly known as: HYDRODIURIL     TAKE these medications   Fish Oil 1000 MG Caps Take 1,000 mg by mouth daily.   fosinopril 10 MG tablet Commonly known as: MONOPRIL Take 1 tablet (10 mg total) by mouth at bedtime.   metFORMIN 1000 MG tablet Commonly known as: GLUCOPHAGE Take 1 tablet (1,000 mg total) by mouth daily.   multivitamin with minerals Tabs tablet Take 1 tablet by mouth daily.   pioglitazone 45 MG tablet Commonly known as: ACTOS Take 1 tablet (45 mg total) by mouth daily.   saxagliptin HCl 5 MG Tabs tablet Commonly known as: Onglyza Take 1 tablet (5 mg total) by mouth daily.   vitamin C 500 MG tablet Commonly known as: ASCORBIC ACID Take 500 mg by mouth daily.   zinc sulfate 220 (50 Zn) MG capsule Take 220 mg by mouth daily.       Vitals:   04/18/19 0524 04/18/19 1222  BP: (!) 112/58 132/68  Pulse: 82 87  Resp: 20   Temp: 98.5 F (36.9 C) 98.1 F (36.7 C)  SpO2: 100% 100%    Skin clean, dry and intact without evidence of skin break down, no evidence of skin tears noted. IV catheter discontinued intact. Site without signs and symptoms of complications. Dressing and pressure applied. Pt denies pain at this time. No complaints noted.  An After Visit Summary was printed and given to the patient. Patient escorted via [place], and D/C home via private auto.  Fuller Mandril, RN

## 2019-04-19 ENCOUNTER — Other Ambulatory Visit: Payer: Self-pay

## 2019-04-19 ENCOUNTER — Encounter: Payer: Self-pay | Admitting: Family Medicine

## 2019-04-19 ENCOUNTER — Ambulatory Visit (INDEPENDENT_AMBULATORY_CARE_PROVIDER_SITE_OTHER): Payer: Medicare Other | Admitting: Family Medicine

## 2019-04-19 ENCOUNTER — Other Ambulatory Visit: Payer: Self-pay | Admitting: Family Medicine

## 2019-04-19 ENCOUNTER — Ambulatory Visit: Payer: Self-pay | Admitting: Family Medicine

## 2019-04-19 VITALS — BP 128/64 | HR 89 | Temp 96.8°F | Wt 169.8 lb

## 2019-04-19 DIAGNOSIS — R0989 Other specified symptoms and signs involving the circulatory and respiratory systems: Secondary | ICD-10-CM | POA: Diagnosis not present

## 2019-04-19 DIAGNOSIS — D649 Anemia, unspecified: Secondary | ICD-10-CM | POA: Diagnosis not present

## 2019-04-19 DIAGNOSIS — R42 Dizziness and giddiness: Secondary | ICD-10-CM | POA: Diagnosis not present

## 2019-04-19 MED ORDER — AMLODIPINE BESYLATE 5 MG PO TABS: 5.0000 mg | ORAL_TABLET | Freq: Every day | ORAL | 1 refills | Status: DC

## 2019-04-19 NOTE — Progress Notes (Signed)
Patient: Natasha Phillips Female    DOB: 1951-02-09   68 y.o.   MRN: [id number] Visit Date: 04/19/2019  Today's Provider: Lelon Huh, MD   Chief Complaint  Patient presents with  . Hospitalization Follow-up   Subjective:     HPI  Follow up ER visit  Patient was seen in ER for complaints of dizziness on 04/17/19. She was treated for hyponatremia with sodium of 128 and dizziness and discharge with sodium of 137. She had MRI brain WITHOUT contrast showing signs of chronic microvascular ischemia, but no discrete lesions.   Treatment for this included d/c HCTZ (was only taking 1/2 tablet a day) and IV fluids. She reports good compliance . She reports this condition is Unchanged.Patient reports today that she is still having episodes of dizziness which she describes as feeling off balance like she is going to fall of to the left.   She does report very labile home blood pressures. States in the evenings her SBP has been in 150s-160, but in 90s to 100s in the morning. She takes fosinipril 10mg  in the evenings and is no longer taking hctz.  She was noted to have mild anemia with last hgb = 9.8 prior to discharge.  ------------------------------------------------------------------------------------   Allergies  Allergen Reactions  . Januvia [Sitagliptin] Diarrhea  . Sulfa Antibiotics      Current Outpatient Medications:  .  fosinopril (MONOPRIL) 10 MG tablet, Take 1 tablet (10 mg total) by mouth at bedtime., Disp: 30 tablet, Rfl: 0 .  metFORMIN (GLUCOPHAGE) 1000 MG tablet, Take 1 tablet (1,000 mg total) by mouth daily., Disp: 90 tablet, Rfl: 4 .  Multiple Vitamin (MULTIVITAMIN WITH MINERALS) TABS tablet, Take 1 tablet by mouth daily., Disp: , Rfl:  .  Omega-3 Fatty Acids (FISH OIL) 1000 MG CAPS, Take 1,000 mg by mouth daily., Disp: , Rfl:  .  pioglitazone (ACTOS) 45 MG tablet, Take 1 tablet (45 mg total) by mouth daily., Disp: 90 tablet, Rfl: 4 .  saxagliptin HCl (ONGLYZA)  5 MG TABS tablet, Take 1 tablet (5 mg total) by mouth daily., Disp: 90 tablet, Rfl: 4 .  vitamin C (ASCORBIC ACID) 500 MG tablet, Take 500 mg by mouth daily., Disp: , Rfl:  .  zinc sulfate 220 (50 Zn) MG capsule, Take 220 mg by mouth daily., Disp: , Rfl:   Review of Systems  Constitutional: Positive for fatigue.  HENT: Negative.   Respiratory: Negative.   Cardiovascular: Negative.   Musculoskeletal: Positive for myalgias.  Neurological: Positive for dizziness and light-headedness. Negative for tremors, seizures, syncope, speech difficulty, weakness, numbness and headaches.  Hematological: Negative.     Social History   Tobacco Use  . Smoking status: Never Smoker  . Smokeless tobacco: Never Used  Substance Use Topics  . Alcohol use: No      Objective:   BP 128/64   Pulse 89   Temp (!) 96.8 F (36 C) (Oral)   Wt 169 lb 12.8 oz (77 kg)   SpO2 98%   BMI 32.08 kg/m  Vitals:   04/19/19 1343  BP: 128/64  Pulse: 89  Temp: (!) 96.8 F (36 C)  TempSrc: Oral  SpO2: 98%  Weight: 169 lb 12.8 oz (77 kg)  Body mass index is 32.08 kg/m.   Physical Exam  General appearance: Well developed, well nourished female, cooperative and in no acute distress Head: Normocephalic, without obvious abnormality, atraumatic Respiratory: Respirations even and unlabored, normal respiratory rate Extremities: All extremities  are intact.  Skin: Skin color, texture, turgor normal. No rashes seen  Psych: Appropriate mood and affect. Neurologic: Mental status: Alert, oriented to person, place, and time, thought content appropriate.     Assessment & Plan    1. Dizziness Has has had ENT evaluation and MRI brain remarkable only for chronic microvascular changes. She does report labile blood pressure as below, but is not really orthostatic. Will change BP medications as below and initiate- Ambulatory referral to Neurology  2. Labile blood pressure Change fosinopril to amlodipine 5mg  Qpm  3.  Anemia, unspecified type Recheck hgb at follow up.   Future Appointments  Date Time Provider Department Center  05/03/2019 11:00 AM 05/05/2019, Sherrie Mustache, MD [facility] None    The entirety of the information documented in the History of Present Illness, Review of Systems and Physical Exam were personally obtained by me. Portions of this information were initially documented by Demetrios Isaacs, CMA and reviewed by me for thoroughness and accuracy.      Fonda Kinder, MD  [facility]

## 2019-04-19 NOTE — Patient Instructions (Addendum)
.   Stop fosinopril and start new prescription for amlodipine to take every evening

## 2019-04-21 NOTE — Discharge Summary (Signed)
Natasha Phillips, is a 68 y.o. female  DOB [birth year]  MRN [id number].  Admission date:  04/17/2019  Admitting Physician  Alford Highland, MD  Discharge Date:  04/18/2019   Primary MD  Malva Limes, MD  Recommendations for primary care physician for things to follow:   Follow with PCP in 3 to 4 days regarding monitoring sodium.   Admission Diagnosis  Dizziness [R42] Hyponatremia [E87.1]   Discharge Diagnosis  Dizziness [R42] Hyponatremia [E87.1]    Active Problems:   Hyponatremia      Past Medical History:  Diagnosis Date  . Anemia   . Diabetes mellitus without complication (HCC)   . Hypertension     Past Surgical History:  Procedure Laterality Date  . BIOPSY THYROID    . CESAREAN SECTION     1981 1988  . TUBAL LIGATION         History of present illness and  Hospital Course:     Kindly see H&P for history of present illness and admission details, please review complete Labs, Consult reports and Test reports for all details in brief  HPI  from the history and physical done on the day of admission  68 year old female with history of hypertension came to hospital because of dizziness, numbness around the mouth, patient found to have severe hyponatremia, patient is admitted for the same.  Hospital Course  : Symptomatic hyponatremia; patient hydrochlorothiazide stopped, received IV fluids, sodium improved from 124 on admission to 13 7 at discharge.  Patient heaviness in the legs, numbness around the mouth improved, advised the patient to follow with PCP in 2 to 3 days regarding blood work for monitoring the sodium dose make sure it stays up.  Advised the patient to stop hydrochlorothiazide.  And patient told me that she has dizziness going on for a while and has seen the PCP and getting vestibular  exercises.  Advised the patient to monitor blood failure with a BP monitor at home, continue fosinopril for the blood pressure at this time.  #2.  Essential hypertension, patient HCTZ stopped because of hyponatremia, continue fosinopril 10 mg daily, according to her PCP recently decreased of lisinopril from 20 mg to 10 mg daily, advised the patient to continue 10 mg daily, held the BP machine at home and receive the blood pressure down 140/90 rises patient can take fosinopril 10 mg in the morning and 10 mg at night, I spoke to patient's daughter and patient son as well. #3 .Diabetes mellitus: Continue Onglyza, Actos, metformin. Discharge Condition: Full code   Follow UP      Discharge Instructions  and  Discharge Medications  With PCP in couple of days regarding blood work.   Allergies as of 04/18/2019      Reactions   Januvia [sitagliptin] Diarrhea   Sulfa Antibiotics       Medication List    STOP taking these medications   hydrochlorothiazide 25 MG tablet Commonly known as: HYDRODIURIL     TAKE these medications   Fish Oil 1000 MG Caps Take 1,000 mg by mouth daily.   metFORMIN 1000 MG tablet Commonly known as: GLUCOPHAGE Take 1 tablet (1,000 mg total) by mouth daily.   multivitamin with minerals Tabs tablet Take 1 tablet by mouth daily.   pioglitazone 45 MG tablet Commonly known as: ACTOS Take 1 tablet (45 mg total) by mouth daily.   saxagliptin HCl 5 MG Tabs tablet Commonly known as: Onglyza Take 1 tablet (5 mg total) by  mouth daily.   vitamin C 500 MG tablet Commonly known as: ASCORBIC ACID Take 500 mg by mouth daily.   zinc sulfate 220 (50 Zn) MG capsule Take 220 mg by mouth daily.         Diet and Activity recommendation: See Discharge Instructions above   Consults obtained -none   Major procedures and Radiology Reports - PLEASE review detailed and final reports for all details, in brief -      Mr Brain Wo Contrast  Result Date:  04/17/2019 CLINICAL DATA:  Heaviness in legs and numbness around the mouth. Dizziness for months. History of hypertension. EXAM: MRI HEAD WITHOUT CONTRAST TECHNIQUE: Multiplanar, multiecho pulse sequences of the brain and surrounding structures were obtained without intravenous contrast. COMPARISON:  None. FINDINGS: Brain: No evidence for acute infarction, hemorrhage, mass lesion, hydrocephalus, or extra-axial fluid. Normal for age cerebral volume. Mild subcortical and periventricular T2 and FLAIR hyperintensities, likely chronic microvascular ischemic change. Vascular: Flow voids are maintained throughout the carotid, basilar, and vertebral arteries. There are no areas of chronic hemorrhage. Skull and upper cervical spine: Unremarkable visualized calvarium, skullbase, and cervical vertebrae. Pituitary, pineal, cerebellar tonsils unremarkable. No upper cervical cord lesions. Sinuses/Orbits: No orbital masses or proptosis. Globes appear symmetric. Sinuses appear well aerated, without evidence for air-fluid level. Other: No nasopharyngeal pathology or mastoid fluid. Scalp and other visualized extracranial soft tissues grossly unremarkable. IMPRESSION: 1. Mild subcortical and periventricular T2 and FLAIR hyperintensities, likely chronic microvascular ischemic change. 2. No acute intracranial findings. Electronically Signed   By: Elsie StainJohn T Curnes M.D.   On: 04/17/2019 21:30    Micro Results     Recent Results (from the past 240 hour(s))  SARS CORONAVIRUS 2 (TAT 6-24 HRS) Nasopharyngeal Nasopharyngeal Swab     Status: None   Collection Time: 04/17/19  9:15 PM   Specimen: Nasopharyngeal Swab  Result Value Ref Range Status   SARS Coronavirus 2 NEGATIVE NEGATIVE Final    Comment: (NOTE) SARS-CoV-2 target nucleic acids are NOT DETECTED. The SARS-CoV-2 RNA is generally detectable in upper and lower respiratory specimens during the acute phase of infection. Negative results do not preclude SARS-CoV-2 infection,  do not rule out co-infections with other pathogens, and should not be used as the sole basis for treatment or other patient management decisions. Negative results must be combined with clinical observations, patient history, and epidemiological information. The expected result is Negative. Fact Sheet for Patients: HairSlick.nohttps://www.fda.gov/media/138098/download Fact Sheet for Healthcare Providers: quierodirigir.comhttps://www.fda.gov/media/138095/download This test is not yet approved or cleared by the [place] FDA and  has been authorized for detection and/or diagnosis of SARS-CoV-2 by FDA under an Emergency Use Authorization (EUA). This EUA will remain  in effect (meaning this test can be used) for the duration of the COVID-19 declaration under Section 56 4(b)(1) of the Act, 21 U.S.C. section 360bbb-3(b)(1), unless the authorization is terminated or revoked sooner. Performed at [facility], [address] [place], [place] [address]        Today   Subjective:   Arcelia Jewhyllis Win today has no headache,no chest abdominal pain,no new weakness tingling or numbness, feels much better wants to go home today.   Objective:   Blood pressure 132/68, pulse 87, temperature 98.1 F (36.7 C), temperature source Oral, resp. rate 20, height 5\' 1"  (1.549 m), weight 74.4 kg, SpO2 100 %.  No intake or output data in the 24 hours ending 04/21/19 1505  Exam Awake Alert, Oriented x 3, No new F.N deficits, Normal affect [place].AT,PERRAL  Supple Neck,No JVD, No cervical lymphadenopathy appriciated.  Symmetrical Chest wall movement, Good air movement bilaterally, CTAB RRR,No Gallops,Rubs or new Murmurs, No Parasternal Heave +ve B.Sounds, Abd Soft, Non tender, No organomegaly appriciated, No rebound -guarding or rigidity. No Cyanosis, Clubbing or edema, No new Rash or bruise  Data Review   CBC w Diff:  Lab Results  Component Value Date   WBC 5.0 04/18/2019   HGB 9.8 (L) 04/18/2019   HGB 11.2 03/26/2019    HCT 28.7 (L) 04/18/2019   HCT 33.0 (L) 03/26/2019   PLT 238 04/18/2019   PLT 288 03/26/2019    CMP:  Lab Results  Component Value Date   NA 137 04/18/2019   NA 137 03/26/2019   K 4.4 04/18/2019   CL 102 04/18/2019   CO2 28 04/18/2019   BUN 22 04/18/2019   BUN 29 (H) 03/26/2019   CREATININE 1.03 (H) 04/18/2019   GLU 96 11/09/2013   PROT 7.4 03/26/2019   ALBUMIN 4.5 03/26/2019   BILITOT 0.2 03/26/2019   ALKPHOS 101 03/26/2019   AST 18 03/26/2019   ALT 10 03/26/2019  .   Total Time in preparing paper work, data evaluation and todays exam - 35 minutes  Epifanio Lesches M.D on 04/18/2019 at 3:05 PM    Note: This dictation was prepared with Dragon dictation along with smaller phrase technology. Any transcriptional errors that result from this process are unintentional.

## 2019-04-23 ENCOUNTER — Telehealth: Payer: Self-pay

## 2019-04-23 NOTE — Telephone Encounter (Signed)
Patient complains that she is still having dizziness and feels like she 2 weeks is too long to wait to have it checked again.  She would like to discuss this with someone about having something (?Labs) done sooner  CB # [phone]

## 2019-04-26 ENCOUNTER — Ambulatory Visit: Payer: Medicare Other

## 2019-04-26 DIAGNOSIS — H269 Unspecified cataract: Secondary | ICD-10-CM | POA: Diagnosis not present

## 2019-04-26 DIAGNOSIS — H04129 Dry eye syndrome of unspecified lacrimal gland: Secondary | ICD-10-CM | POA: Diagnosis not present

## 2019-04-26 DIAGNOSIS — E11311 Type 2 diabetes mellitus with unspecified diabetic retinopathy with macular edema: Secondary | ICD-10-CM | POA: Diagnosis not present

## 2019-04-26 NOTE — Telephone Encounter (Signed)
There are no other labs or tests that I know of to check. Will have to see what neurologist thinks.

## 2019-04-27 NOTE — Telephone Encounter (Signed)
Patient advised. She is scheduled for a follow up here on 10/26 and Neurologist appointment is 06/15/2019

## 2019-05-03 ENCOUNTER — Encounter: Payer: Self-pay | Admitting: Family Medicine

## 2019-05-03 ENCOUNTER — Other Ambulatory Visit: Payer: Self-pay

## 2019-05-03 ENCOUNTER — Ambulatory Visit (INDEPENDENT_AMBULATORY_CARE_PROVIDER_SITE_OTHER): Payer: Medicare Other | Admitting: Family Medicine

## 2019-05-03 VITALS — BP 146/78 | HR 82 | Temp 97.7°F | Wt 174.8 lb

## 2019-05-03 DIAGNOSIS — R42 Dizziness and giddiness: Secondary | ICD-10-CM | POA: Diagnosis not present

## 2019-05-03 DIAGNOSIS — D649 Anemia, unspecified: Secondary | ICD-10-CM | POA: Diagnosis not present

## 2019-05-03 DIAGNOSIS — R0989 Other specified symptoms and signs involving the circulatory and respiratory systems: Secondary | ICD-10-CM

## 2019-05-03 DIAGNOSIS — E871 Hypo-osmolality and hyponatremia: Secondary | ICD-10-CM

## 2019-05-03 DIAGNOSIS — R270 Ataxia, unspecified: Secondary | ICD-10-CM | POA: Diagnosis not present

## 2019-05-03 MED ORDER — AMLODIPINE BESYLATE 5 MG PO TABS: 5.0000 mg | ORAL_TABLET | Freq: Every day | ORAL | Status: DC

## 2019-05-03 NOTE — Progress Notes (Signed)
Patient: Natasha Phillips Female    DOB: Apr 22, 1951   68 y.o.   MRN: [id number] Visit Date: 05/03/2019  Today's Provider: Lelon Huh, MD   Chief Complaint  Patient presents with  . Labile blood pressure   Subjective:     HPI Labile Blood Pressure:  Patient presents for a 2 week follow up. Last OV was on 04/19/2019. Patient advised to change Fosinopril to Amlodipine 5 mg due to labile blood pressure. She had ER visit 04/17/2019 for dizziness with sodium of 124 and which time hctz was discontinue. She also was noted to be mildly anemia with hgb=9.8 and norma RBC indices.She reports good compliance with treatment plan. She states symptoms are unchanged. She is experiencing dizziness and leg swelling. She reports some of diastolics have been in the 27X and systolics around 412, but others in the 150s/90s. She continues to have dizziness, but is improve since changing medications. She has seen ENT and had normal VNG. She is scheduled to see neurology on December 8th.   Allergies  Allergen Reactions  . Januvia [Sitagliptin] Diarrhea  . Sulfa Antibiotics      Current Outpatient Medications:  .  amLODipine (NORVASC) 5 MG tablet, Take 1 tablet (5 mg total) by mouth daily., Disp: 30 tablet, Rfl: 1 .  metFORMIN (GLUCOPHAGE) 1000 MG tablet, Take 1 tablet (1,000 mg total) by mouth daily., Disp: 90 tablet, Rfl: 4 .  Multiple Vitamin (MULTIVITAMIN WITH MINERALS) TABS tablet, Take 1 tablet by mouth daily., Disp: , Rfl:  .  Omega-3 Fatty Acids (FISH OIL) 1000 MG CAPS, Take 1,000 mg by mouth daily., Disp: , Rfl:  .  pioglitazone (ACTOS) 45 MG tablet, Take 1 tablet (45 mg total) by mouth daily., Disp: 90 tablet, Rfl: 4 .  saxagliptin HCl (ONGLYZA) 5 MG TABS tablet, Take 1 tablet (5 mg total) by mouth daily., Disp: 90 tablet, Rfl: 4 .  vitamin C (ASCORBIC ACID) 500 MG tablet, Take 500 mg by mouth daily., Disp: , Rfl:  .  zinc sulfate 220 (50 Zn) MG capsule, Take 220 mg by mouth daily., Disp:  , Rfl:   Review of Systems  Constitutional: Negative.   Respiratory: Negative.   Cardiovascular: Negative.   Musculoskeletal: Negative.     Social History   Tobacco Use  . Smoking status: Never Smoker  . Smokeless tobacco: Never Used  Substance Use Topics  . Alcohol use: No      Objective:   BP (!) 146/78 (BP Location: Right Arm, Patient Position: Sitting, Cuff Size: Normal)   Pulse 82   Temp 97.7 F (36.5 C) (Temporal)   Wt 174 lb 12.8 oz (79.3 kg)   SpO2 99%   BMI 33.03 kg/m  Vitals:   05/03/19 1051  BP: (!) 146/78  Pulse: 82  Temp: 97.7 F (36.5 C)  TempSrc: Temporal  SpO2: 99%  Weight: 174 lb 12.8 oz (79.3 kg)  Body mass index is 33.03 kg/m.   Physical Exam  General appearance: Overweight female, cooperative and in no acute distress Head: Normocephalic, without obvious abnormality, atraumatic Respiratory: Respirations even and unlabored, normal respiratory rate Extremities: All extremities are intact.  Skin: Skin color, texture, turgor normal. No rashes seen  Psych: Appropriate mood and affect. Neurologic: Mental status: Alert, oriented to person, place, and time, thought content appropriate.       Assessment & Plan    1. Labile blood pressure Is a little better since stopping hctz and fosinopril and starting hctz,  but is having some edema. She is going to check BP before taking medications in the evening. If >130/90 then will take a full tables, if <130/80 will only take 1/2 tablet. If >130/80 in the morning will take an additional 1/2 tablet. Anticipate scheduling BP follow up in 3-4 weeks.   2. Anemia, unspecified type  - B12 and Folate Panel  3. Dizziness Normal ENT evaluation, may be secondary to labile BP, has appointment scheduled with neuro in December.   4. Ataxia  - CBC - B12 and Folate Panel  5. Hyponatremia Has since been taken off of hctz.  - Renal function panel  The entirety of the information documented in the History of  Present Illness, Review of Systems and Physical Exam were personally obtained by me. Portions of this information were initially documented by Martyn Ehrich, CMA and reviewed by me for thoroughness and accuracy.      Mila Merry, MD  [facility]

## 2019-05-03 NOTE — Patient Instructions (Addendum)
.   Check blood pressure every evening before taking amlodipine o If BP is 130/80 or higher  take a full tablet of amlodipine o If BP is less than 130/80, take 1/2 tablet of amlodipine   Check blood pressure every morning  If BP is 130/80 or higher, take 1/2 tablet of amlodipine  If BP is less than 130/80, do not take amlodipine

## 2019-05-04 LAB — CBC
Hematocrit: 34.2 % (ref 34.0–46.6)
Hemoglobin: 11 g/dL — ABNORMAL LOW (ref 11.1–15.9)
MCH: 29.3 pg (ref 26.6–33.0)
MCHC: 32.2 g/dL (ref 31.5–35.7)
MCV: 91 fL (ref 79–97)
Platelets: 267 10*3/uL (ref 150–450)
RBC: 3.76 x10E6/uL — ABNORMAL LOW (ref 3.77–5.28)
RDW: 13.1 % (ref 11.7–15.4)
WBC: 4.2 10*3/uL (ref 3.4–10.8)

## 2019-05-04 LAB — RENAL FUNCTION PANEL
Albumin: 4.4 g/dL (ref 3.8–4.8)
BUN/Creatinine Ratio: 16 (ref 12–28)
BUN: 19 mg/dL (ref 8–27)
CO2: 25 mmol/L (ref 20–29)
Calcium: 10.2 mg/dL (ref 8.7–10.3)
Chloride: 104 mmol/L (ref 96–106)
Creatinine, Ser: 1.17 mg/dL — ABNORMAL HIGH (ref 0.57–1.00)
GFR calc Af Amer: 56 mL/min/{1.73_m2} — ABNORMAL LOW (ref >59)
GFR calc non Af Amer: 49 mL/min/{1.73_m2} — ABNORMAL LOW (ref >59)
Glucose: 91 mg/dL (ref 65–99)
Phosphorus: 3.1 mg/dL (ref 3.0–4.3)
Potassium: 4.4 mmol/L (ref 3.5–5.2)
Sodium: 141 mmol/L (ref 134–144)

## 2019-05-04 LAB — B12 AND FOLATE PANEL
Folate: 14.5 ng/mL (ref >3.0)
Vitamin B-12: 472 pg/mL (ref 232–1245)

## 2019-05-06 ENCOUNTER — Other Ambulatory Visit: Payer: Self-pay | Admitting: Family Medicine

## 2019-05-06 ENCOUNTER — Telehealth: Payer: Self-pay

## 2019-05-06 MED ORDER — VALSARTAN 80 MG PO TABS: 80.0000 mg | ORAL_TABLET | Freq: Every day | ORAL | 1 refills | Status: DC

## 2019-05-06 NOTE — Telephone Encounter (Signed)
Patient advised.

## 2019-05-06 NOTE — Telephone Encounter (Signed)
Have sent prescription for valsartan to [facility]

## 2019-05-06 NOTE — Telephone Encounter (Signed)
Patient states she is still experiencing dizziness and labile blood pressure readings. She is requesting to change blood medication to something different since she has been taking the same medication for a long time. She would like the RX sent to [facility]. CB#[phone]

## 2019-05-07 DIAGNOSIS — R011 Cardiac murmur, unspecified: Secondary | ICD-10-CM | POA: Diagnosis not present

## 2019-05-07 DIAGNOSIS — R079 Chest pain, unspecified: Secondary | ICD-10-CM | POA: Diagnosis not present

## 2019-05-07 DIAGNOSIS — E669 Obesity, unspecified: Secondary | ICD-10-CM | POA: Diagnosis not present

## 2019-05-07 DIAGNOSIS — E119 Type 2 diabetes mellitus without complications: Secondary | ICD-10-CM | POA: Diagnosis not present

## 2019-05-07 DIAGNOSIS — M1990 Unspecified osteoarthritis, unspecified site: Secondary | ICD-10-CM | POA: Diagnosis not present

## 2019-05-07 DIAGNOSIS — R0602 Shortness of breath: Secondary | ICD-10-CM | POA: Diagnosis not present

## 2019-05-07 DIAGNOSIS — I10 Essential (primary) hypertension: Secondary | ICD-10-CM | POA: Diagnosis not present

## 2019-05-07 DIAGNOSIS — R600 Localized edema: Secondary | ICD-10-CM | POA: Diagnosis not present

## 2019-05-07 NOTE — Telephone Encounter (Signed)
Patient was switched to Valsartan yesterday. Okay to send a 90 day supply? Please advise.

## 2019-05-07 NOTE — Telephone Encounter (Signed)
No, this is just a trial to see if makes her dizzy like her old medications. Just dispense 30 for now.

## 2019-05-11 ENCOUNTER — Telehealth: Payer: Self-pay | Admitting: Family Medicine

## 2019-05-11 DIAGNOSIS — E119 Type 2 diabetes mellitus without complications: Secondary | ICD-10-CM

## 2019-05-11 DIAGNOSIS — E113413 Type 2 diabetes mellitus with severe nonproliferative diabetic retinopathy with macular edema, bilateral: Secondary | ICD-10-CM | POA: Diagnosis not present

## 2019-05-11 DIAGNOSIS — H2513 Age-related nuclear cataract, bilateral: Secondary | ICD-10-CM | POA: Diagnosis not present

## 2019-05-11 DIAGNOSIS — H40053 Ocular hypertension, bilateral: Secondary | ICD-10-CM | POA: Diagnosis not present

## 2019-05-11 NOTE — Telephone Encounter (Signed)
Pt requesting a referral for diabetic education for a food nutritionist.  [facility] Address: [place], [place], [place] [address]  Fax [phone]  Please let the pt know at [phone].  Thanks, [employer]

## 2019-05-11 NOTE — Telephone Encounter (Signed)
Referral ordered

## 2019-05-11 NOTE — Telephone Encounter (Signed)
Left patient a message advising her that referral has been placed and she will receive a call to schedule appointment.

## 2019-05-13 ENCOUNTER — Other Ambulatory Visit: Payer: Self-pay

## 2019-05-13 ENCOUNTER — Other Ambulatory Visit (HOSPITAL_COMMUNITY)
Admission: RE | Admit: 2019-05-13 | Discharge: 2019-05-13 | Disposition: A | Discharge status: Home / Self Care | Payer: Medicare Other | Source: Ambulatory Visit | Attending: Obstetrics and Gynecology | Admitting: Obstetrics and Gynecology

## 2019-05-13 ENCOUNTER — Ambulatory Visit (INDEPENDENT_AMBULATORY_CARE_PROVIDER_SITE_OTHER): Payer: Medicare Other | Admitting: Obstetrics and Gynecology

## 2019-05-13 ENCOUNTER — Encounter: Payer: Self-pay | Admitting: Obstetrics and Gynecology

## 2019-05-13 VITALS — BP 132/80 | Ht 61.0 in | Wt 177.0 lb

## 2019-05-13 DIAGNOSIS — Z01419 Encounter for gynecological examination (general) (routine) without abnormal findings: Secondary | ICD-10-CM | POA: Diagnosis not present

## 2019-05-13 DIAGNOSIS — Z124 Encounter for screening for malignant neoplasm of cervix: Secondary | ICD-10-CM | POA: Insufficient documentation

## 2019-05-13 DIAGNOSIS — Z1231 Encounter for screening mammogram for malignant neoplasm of breast: Secondary | ICD-10-CM

## 2019-05-13 NOTE — Patient Instructions (Signed)
[facility] [address] [place] [address]  [facility] [place]  [address] [place] [address]  Phone: [phone]  

## 2019-05-13 NOTE — Progress Notes (Signed)
Gynecology Annual Exam  PCP: Malva Limes, MD  Chief Complaint:  Chief Complaint  Patient presents with  . Annual Exam    History of Present Illness:Patient is a 68 y.o. G2P2 presents for annual exam. The patient has no complaints today.   LMP: No LMP recorded. Patient is postmenopausal. Menopausal no bleeding concerns.  The patient is sexually active. She denies dyspareunia.  The patient does perform self breast exams.  There is no notable family history of breast or ovarian cancer in her family.  The patient wears seatbelts: yes.   The patient has regular exercise: not asked.    The patient denies current symptoms of depression.     Review of Systems: Review of Systems  Constitutional: Negative for chills and fever.  HENT: Negative for congestion.   Respiratory: Negative for cough and shortness of breath.   Cardiovascular: Negative for chest pain and palpitations.  Gastrointestinal: Negative for abdominal pain, constipation, diarrhea, heartburn, nausea and vomiting.  Genitourinary: Negative for dysuria, frequency and urgency.  Skin: Negative for itching and rash.  Neurological: Negative for dizziness and headaches.  Endo/Heme/Allergies: Negative for polydipsia.  Psychiatric/Behavioral: Negative for depression.    Past Medical History:  Past Medical History:  Diagnosis Date  . Anemia   . Diabetes mellitus without complication (HCC)   . Hypertension     Past Surgical History:  Past Surgical History:  Procedure Laterality Date  . BIOPSY THYROID    . CESAREAN SECTION     1981 1988  . TUBAL LIGATION      Gynecologic History:  No LMP recorded. Patient is postmenopausal. Last Pap: Results were: 01/20/2018  NIL and HR HPV negative  Last mammogram: 02/12/2018 Results were: BI-RAD I  Obstetric History: G2P2  Family History:  Family History  Problem Relation Age of Onset  . Diabetes Mother   . Heart failure Mother   . Diabetes Sister   . Healthy Sister    . Healthy Sister   . Healthy Sister   . Diabetes Brother   . Diabetes Sister   . Heart failure Father   . Brain cancer Brother   . Throat cancer Brother   . Glaucoma Other        Runs on Paternal Side  . Breast cancer Cousin     Social History:  Social History   Socioeconomic History  . Marital status: Widowed    Spouse name: Not on file  . Number of children: 2  . Years of education: College  . Highest education level: Not on file  Occupational History  . 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 Financial resource strain: Not on file  . Food insecurity    Worry: Not on file    Inability: Not on file  . Transportation needs    Medical: Not on file    Non-medical: Not on file  Tobacco Use  . Smoking status: Never Smoker  . Smokeless tobacco: Never Used  Substance and Sexual Activity  . Alcohol use: No  . Drug use: No  . Sexual activity: Not Currently    Birth control/protection: None  Lifestyle  . Physical activity    Days per week: 5 days    Minutes per session: 20 min  . Stress: Not on file  Relationships  . Social Musician on phone: Not on file    Gets together: Not on file  Attends religious service: Not on file    Active member of club or organization: Not on file    Attends meetings of clubs or organizations: Not on file    Relationship status: Not on file  . Intimate partner violence    Fear of current or ex partner: Not on file    Emotionally abused: Not on file    Physically abused: Not on file    Forced sexual activity: Not on file  Other Topics Concern  . Not on file  Social History Narrative  . Not on file    Allergies:  Allergies  Allergen Reactions  . Januvia [Sitagliptin] Diarrhea  . Sulfa Antibiotics     Medications: Prior to Admission medications   Medication Sig Start Date End Date Taking? Authorizing Provider  amLODipine (NORVASC) 5 MG tablet Take 1 tablet (5 mg total)  by mouth daily. Take 1/2-1 tablet twice daily based on home blood pressure readings 05/03/19   Malva LimesFisher, Donald E, MD  metFORMIN (GLUCOPHAGE) 1000 MG tablet Take 1 tablet (1,000 mg total) by mouth daily. 09/14/18   Malva LimesFisher, Donald E, MD  Multiple Vitamin (MULTIVITAMIN WITH MINERALS) TABS tablet Take 1 tablet by mouth daily.    [provider]  Omega-3 Fatty Acids (FISH OIL) 1000 MG CAPS Take 1,000 mg by mouth daily.    [provider]  pioglitazone (ACTOS) 45 MG tablet Take 1 tablet (45 mg total) by mouth daily. 09/14/18   Malva LimesFisher, Donald E, MD  saxagliptin HCl (ONGLYZA) 5 MG TABS tablet Take 1 tablet (5 mg total) by mouth daily. 09/14/18   Malva LimesFisher, Donald E, MD  valsartan (DIOVAN) 80 MG tablet Take 1 tablet (80 mg total) by mouth daily. 05/06/19   Malva LimesFisher, Donald E, MD  vitamin C (ASCORBIC ACID) 500 MG tablet Take 500 mg by mouth daily.    [provider]  zinc sulfate 220 (50 Zn) MG capsule Take 220 mg by mouth daily.    [provider]    Physical Exam Vitals: Blood pressure 132/80, height 5\' 1"  (1.549 m), weight 177 lb (80.3 kg).  General: NAD, appears stated age, well nourished HEENT: normocephalic, anicteric Thyroid: no enlargement, no palpable nodules Pulmonary: No increased work of breathing, CTAB Cardiovascular: RRR, distal pulses 2+ Breast: Breast symmetrical, no tenderness, no palpable nodules or masses, no skin or nipple retraction present, no nipple discharge.  No axillary or supraclavicular lymphadenopathy. Abdomen: NABS, soft, non-tender, non-distended.  Umbilicus without lesions.  No hepatomegaly, splenomegaly or masses palpable. No evidence of hernia  Genitourinary:  External: Normal external female genitalia.  Normal urethral meatus, normal Bartholin's and Skene's glands.    Vagina: Normal vaginal mucosa, no evidence of prolapse.    Cervix: Grossly normal in appearance, no bleeding  Uterus: Non-enlarged, mobile, normal contour.  No CMT  Adnexa:  ovaries non-enlarged, no adnexal masses  Rectal: deferred  Lymphatic: no evidence of inguinal lymphadenopathy Extremities: no edema, erythema, or tenderness Neurologic: Grossly intact Psychiatric: mood appropriate, affect full  Female chaperone present for pelvic and breast  portions of the physical exam     Assessment: 68 y.o. G2P2 routine annual exam  Plan: Problem List Items Addressed This Visit    None    Visit Diagnoses    Encounter for gynecological examination without abnormal finding    -  Primary   Screening for malignant neoplasm of cervix       Relevant Orders   Cytology - PAP   Breast cancer screening by mammogram  Relevant Orders   MM 3D SCREEN BREAST BILATERAL      1) Mammogram - recommend yearly screening mammogram.  Mammogram Was ordered today  2) STI screening  was notoffered and therefore not obtained  3) ASCCP guidelines and rational discussed.  Patient opts for yearly screening interval  4) Routine healthcare maintenance including cholesterol, diabetes screening discussed managed by PCP  5) Colonoscopy - normal 09/30/2011 next in 2023  6) Return in about 1 year (around 05/12/2020) for annual.    Malachy Mood, MD Mosetta Pigeon, [place] 05/13/2019, 8:40 AM

## 2019-05-18 ENCOUNTER — Ambulatory Visit: Payer: Medicare Other | Attending: Otolaryngology | Admitting: Physical Therapy

## 2019-05-18 ENCOUNTER — Encounter: Payer: Self-pay | Admitting: Physical Therapy

## 2019-05-18 ENCOUNTER — Other Ambulatory Visit: Payer: Self-pay

## 2019-05-18 DIAGNOSIS — R42 Dizziness and giddiness: Secondary | ICD-10-CM | POA: Diagnosis not present

## 2019-05-18 LAB — CYTOLOGY - PAP: Diagnosis: NEGATIVE

## 2019-05-18 NOTE — Therapy (Signed)
[facility] MAIN [facility] SERVICES [address] [place], [place], [address] Phone: [phone]   Fax:  [phone]  Physical Therapy Evaluation  Patient Details  Name: Natasha Phillips MRN: [id number] Date of Birth: 02/13/1951 Referring Provider (PT): Dr. Andee Poles   Encounter Date: 05/18/2019    PT End of Session - 05/20/19 1115    Visit Number  1    Number of Visits  9    Date for PT Re-Evaluation  07/13/19    PT Start Time  1120   patient arrived late to appointment   PT Stop Time  1215    PT Time Calculation (min)  55 min    Equipment Utilized During Treatment  Gait belt    Activity Tolerance  Patient tolerated treatment well    Behavior During Therapy  [facility] for tasks assessed/performed        Past Medical History:  Diagnosis Date  . Anemia   . Diabetes mellitus without complication (HCC)   . Hypertension     Past Surgical History:  Procedure Laterality Date  . BIOPSY THYROID    . CESAREAN SECTION     1981 1988  . TUBAL LIGATION      There were no vitals filed for this visit.   Subjective Assessment - 05/20/19 1052    Subjective  Patient reports that her dizziness is better than it was initially and that she is no longer having vertigo, but states she is getting dizziness a few times a week still.    Pertinent History  Patient reports she first started having dizziness in July 2020. Patient was seen by Dr. Sherrie Mustache, Family Medicine, on 7/21 for acute otitis media at which time she was prescribed amoxicillin. Patient was seen in the ED on 7/26 and 7/27 for traumatic blister of oral cavity at which time she was prescribed a Medrol DosePak. Patient reports that she had a hot popcorn kernel lodged in the soft palate/roof of her mouth that she tried to use tweezers to extract. Patient reports her throat and uvula were swollen. Patient reports she has been "out of whack" since that time and that her dizziness began after the ER visit.  Patient reports the onset happened after she got water in her ear in the shower. Then, the next day she got up and had vertigo and had to hold on. She had several episodes of vertigo. Patient reports that after she took the antibiotic medicine the vertigo cleared up. Patient states her balance was off after this and she began to use a cane. She used the cane in the house. Patient reports now she does not use the cane at home, but takes the cane out in the community. Patient has labile HTN and patient reports that her physicians are still working on getting her blood pressures regulated. Patient reports she has a home blood pressure monitor so she can take her blood pressure. Patient reports when her blood pressure gets low or high she will get dizzy symptoms. Patient was seen by Dr. Andee Poles, ENT physician, on 9/15 for evaluation of her dizziness symptoms. Patient reports her dizziness has gotten a lot better, but she still feels a few little pains and some fluid in her ear. Patient describes her dizziness as vertigo only at onset in July, unsteadiness, lightheadedness. Patient reports she is currently getting dizziness once or twice a week and the episodes are not lasting as long as they were initially. Patient reports episodes can be  5 minutes or 15-20 minutes long.    Diagnostic tests  VNG: normal per patient report; brain MRI-chronic microvascular changes    Patient Stated Goals  to ambulate without a cane and to have decreased dizziness    Currently in Pain?  Other (Comment)   none stated        [facility] PT Assessment - 05/18/19 1301      Assessment   Medical Diagnosis  dizziness and giddiness    Referring Provider (PT)  Dr. Andee PolesVaught    Onset Date/Surgical Date  01/26/19      Dynamic Gait Index   Level Surface  Normal    Change in Gait Speed  Normal    Gait with Horizontal Head Turns  Normal    Gait with Vertical Head Turns  Normal    Gait and Pivot Turn  Normal    Step Over Obstacle  Normal     Step Around Obstacles  Normal    Steps  Normal    Total Score  24         VESTIBULAR AND BALANCE EVALUATION  HISTORY:  Subjective history of current problem: History obtained from patient report and medical record. Patient reports she first started having dizziness in July 2020. Patient was seen by Dr. Sherrie MustacheFisher, Family Medicine, on 7/21 for acute otitis media at which time she was prescribed amoxicillin. Patient was seen in the ED on 7/26 and 7/27 for traumatic blister of oral cavity at which time she was prescribed a Medrol DosePak. Patient reports that she had a hot popcorn kernel lodged in the soft palate/roof of her mouth that she tried to use tweezers to extract. Patient reports her throat and uvula were swollen. Patient reports she has been "out of whack" since that time and that her dizziness began after the ER visit. Patient reports the onset happened after she got water in her ear in the shower. Then, the next day she got up and had vertigo and had to hold on. She had several episodes of vertigo. Patient reports that after she took the antibiotic medicine the vertigo cleared up. Patient states her balance was off after this and she began to use a cane. She used the cane in the house. Patient reports now she does not use the cane at home, but takes the cane out in the community. Patient has labile HTN and patient reports that her physicians are still working on getting her blood pressures regulated. Patient reports she has a home blood pressure monitor so she can take her blood pressure. Patient reports when her blood pressure gets low or high she will get dizzy symptoms. Patient states she has had repeat blood work several times and states it looks like her infection is better, but she is wondering if she needs more antibiotics. Patient was seen by Dr. Andee PolesVaught, ENT physician, on 9/15 for evaluation of her dizziness symptoms. Patient reports she has had a VNG test which was normal. Patient reports  she was prescribed Meclizine, but states she stopped taking this as it had no impact on her symptoms. Patient reports that she does not have any follow-up appointments scheduled with ENT. Patient had a brain MRI which showed chronic microvascular changes. Patient was seen in the ED on 10/10 for hyponatremia and dizziness. Patient was taken off hydrochlorothiazide. Patient reports her dizziness has gotten a lot better, but she still feels a few little pains and some fluid in her ear. Patient describes her dizziness as vertigo only  at onset in July, unsteadiness, lightheadedness. Patient reports she is currently getting dizziness once or twice a week and the episodes are not lasting as long as they were initially. Patient reports episodes can be 5 minutes or 15-20 minutes long. Patient reports the antibiotics and adjustments to her blood pressure medications helped to ease her symptoms but is not aware of anything else that eases her symptoms. Patient is not aware of anything that brings on her dizziness symptoms. Patient was referred to neurology and has an appointment with Dr. Brigitte Pulse in December.   Progression of symptoms: better History of similar episodes: no  Falls (yes/no): no Number of falls in past 6 months: 0  Auditory complaints (tinnitus, pain, drainage): denies drainage and tinnitus in ear. Patient states she can feel fluid in her ears at times. Patient states it is mostly in her left ear.  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 Patient reports occasional blurry vision which she states is from the diabetes and some from the dizziness in the beginning, but states that is very rare now.   Current Symptoms: (dysarthria, dysphagia, drop attacks, bowel and bladder changes, recent weight loss/gain) Review of systems negative for red flags.    EXAMINATION   SOMATOSENSORY:  Any N & T in  extremities or weakness: denies       COORDINATION: Finger to Nose:   Normal Past Pointing:   Normal  MUSCULOSKELETAL SCREEN: Cervical Spine ROM: AROM cervical spine WFL.  Strength: Bilateral hip flexion, ABD and ADD grossly +4/5.  Gait: Patient arrives ambulating with SPC with fair cadence with step through gait pattern. Patient ambulates with fair scanning of visual environment.  Balance: Patient is challenged by uneven surfaces, narrow base of support.  POSTURAL CONTROL TESTS:  Clinical Test of Sensory Interaction for Balance (CTSIB): To be assessed next visit  OCULOMOTOR / VESTIBULAR TESTING:  Oculomotor Exam- Room Light  Normal Abnormal Comments  Ocular Alignment N    Ocular ROM N    Spontaneous Nystagmus N    Gaze evoked Nystagmus N    Smooth Pursuit  Abn Multiple saccades noted  Saccades  Abn Several hypometric saccades noted  VOR Normal    VOR Cancellation Normal    Left Head Impulse     Right Head Impulse       BPPV TESTS:  Symptoms Duration Intensity Nystagmus  Left Dix-Hallpike None   None observed  Right Dix-Hallpike None   None observed  Left Head Roll None   None observed  Right Head Roll None   None observed    FUNCTIONAL OUTCOME MEASURES:  Results Comments  DHI     12/100 Low perception of handicap  ABC Scale      71.2 % Falls risk; in need of intervention  DGI       24 /24 Normal; safe for community mobility    VOR X 1 exercise:  Demonstrated and educated as to VOR X1.  Patient performed VOR X 1 horizontal in sitting 1 rep of 30 seconds and 2 reps of 1 minute each with verbal cues for technique.  Patient reports the target is staying in focus and denies dizziness.     PT Education - 05/18/19 1341    Education Details  discussed plan of care and goals    Person(s) Educated  Patient    Methods  Explanation    Comprehension  Verbalized understanding  PT Short Term Goals - 05/18/19 1342      PT SHORT TERM GOAL #1   Title  Pt will  be independent with HEP in order to improve balance and decrease dizziness symptoms in order to decrease fall risk and improve function.    Time  4    Period  Weeks    Status  New    Target Date  06/15/19        PT Long Term Goals - 05/18/19 1343      PT LONG TERM GOAL #1   Title  Patient will demonstrate decreased falls risk as indicated by Activities Specific Balance Confidence Scale score of 80% or greater.    Time  8    Period  Weeks    Status  New    Target Date  07/13/19      PT LONG TERM GOAL #2   Title  Patient will report 50% or greater improvement in her symptoms of dizziness and imbalance with provoking motions or positions.    Time  8    Period  Weeks    Status  New    Target Date  07/13/19      PT LONG TERM GOAL #3   Title  Patient will be able to ambulate at self-selected gait speed of 1.0 M/s or greater without AD without imbalance on level surfaces to be able to ambulate about her home and community safely.    Time  8    Period  Weeks    Status  New    Target Date  07/13/19        Plan - 05/20/19 1117    Clinical Impression Statement  Patient presents with complaints of 4 month history of vertigo, dizziness and imbalance. Patient reports she had vertigo at inital onset, but the vertigo has subsided. Patient reports she has started to use a SPC due to her dizziness and imbalance symptoms. Patient also has history of hyponatremia and labile HTN which have inpacted her dizziness symptoms. Patient with abnormal smooth pursuits and saccades which are potential indicatros of central symptoms. Patient with decreased bilateral hip flexion, ABD and ADD strength. Patient appears to have mixed factors that are contributing to her symptoms. Patient would benefit from PT services to address functional deficits and goals as set on plan of care.    Personal Factors and Comorbidities  Comorbidity 3+;Time since onset of injury/illness/exacerbation    Comorbidities  labile HTN,  hyponatremia, anemia, DM    Examination-Activity Limitations  Locomotion Level;Stairs    Stability/Clinical Decision Making  Evolving/Moderate complexity    Clinical Decision Making  Moderate    Rehab Potential  Good    PT Frequency  1x / week    PT Duration  8 weeks    PT Treatment/Interventions  Canalith Repostioning;Gait training;Stair training;Therapeutic activities;Therapeutic exercise;Balance training;Neuromuscular re-education;Vestibular;Patient/family education    PT Next Visit Plan  perform modified CTSIB and head thrust test; work on balance and vestibular exercises    Consulted and Agree with Plan of Care  Patient        Patient will benefit from skilled therapeutic intervention in order to improve the following deficits and impairments:     Visit Diagnosis: Dizziness and giddiness     Problem List Patient Active Problem List   Diagnosis Date Noted  . Hyponatremia 04/17/2019  . Gastroesophageal reflux disease 09/16/2018  . Erosion of oral mucosa 10/28/2016  . Thyroid nodule 10/14/2015  . Abnormal cervical Papanicolaou smear 01/11/2015  .  Absolute anemia 01/11/2015  . Mild mitral insufficiency 01/11/2015  . CN (constipation) 01/11/2015  . Calcium blood increased 01/11/2015  . LBP (low back pain) 01/11/2015  . Adiposity 01/11/2015  . Leg paresthesia 01/11/2015  . Neuralgia neuritis, sciatic nerve 01/11/2015  . Avitaminosis D 01/11/2015  . Essential (primary) hypertension 11/22/2014  . Controlled type 2 diabetes mellitus without complication (HCC) 11/22/2014   Mardelle Matte PT, DPT [phone] Mardelle Matte 05/18/2019, 1:47 PM  [facility] MAIN [facility] SERVICES [address] [place], [place], [address] Phone: [phone]   Fax:  [phone]  Name: Natasha Phillips MRN: [id number] Date of Birth: [birth year]

## 2019-05-25 ENCOUNTER — Ambulatory Visit: Payer: Medicare Other | Admitting: Physical Therapy

## 2019-05-26 ENCOUNTER — Ambulatory Visit: Payer: Self-pay | Admitting: *Deleted

## 2019-05-26 NOTE — Telephone Encounter (Signed)
Left message for patient to call back. Per Laverna Peace NP, patient needs to come in office to be seen for visit.KW

## 2019-05-26 NOTE — Telephone Encounter (Signed)
I returned pt's call.  Her BP is 132/56.   She is c/o being dizzy.  She mentioned she started going to a therapist for the dizziness.   She has been to one session and she says,  "I haven't been right since then".    "I don't understand why he can't get this BP straightened out".  "He has been working on this since July". "I know when my BP is low because I'm more dizzy like now".  "What should I do?"    "I have an appt with Dr. Caryn Section on Nov. 30th".  I checked to see about getting her an appt earlier however she "just wants some suggestions what to do for the dizziness until she sees him on the 30th".    I let her know I would send a note to Dr. Caryn Section and see what he would recommend she do.   She was agreeable to this plan.  Protocol is to be seen within 3 days however pt has an appt on Nov. 30 and wants to wait until that appt.    She "just needs suggestions what to do for the dizziness until then".  I sent this note to the office of Dr. Lelon Huh for further disposition as this is not an acute onset of dizziness.  She is under treatment for it from Dr. Caryn Section.      Reason for Disposition . [1] Taking BP medications AND [2] feels is having side effects (e.g., impotence, cough, dizzy upon standing)  Answer Assessment - Initial Assessment Questions 1. BLOOD PRESSURE: "What is the blood pressure?" "Did you take at least two measurements 5 minutes apart?"     He changed my BP med to Valsartan.   I'm still having highs and lows.   The dizziness is better.   When it's low I'm more dizzy.    It still needs to be controlled.     2. ONSET: "When did you take your blood pressure?"     132/56 about 30 minutes ago.   My normal BP 120/75 to 117/60 something.    It's up and down. 3. HOW: "How did you obtain the blood pressure?" (e.g., visiting nurse, automatic home BP monitor)     BP machine at home.   4. HISTORY: "Do you have a history of high blood pressure?"     Yes 5. MEDICATIONS: "Are you  taking any medications for blood pressure?" "Have you missed any doses recently?"     Yes    6. OTHER SYMPTOMS: "Do you have any symptoms?" (e.g., headache, chest pain, blurred vision, difficulty breathing, weakness)     I'm dizzy 7. PREGNANCY: "Is there any chance you are pregnant?" "When was your last menstrual period?"     N/A  Due to age  Protocols used: Ada

## 2019-05-26 NOTE — Telephone Encounter (Signed)
From [facility], please review 

## 2019-05-27 ENCOUNTER — Ambulatory Visit (INDEPENDENT_AMBULATORY_CARE_PROVIDER_SITE_OTHER): Payer: Medicare Other | Admitting: Adult Health

## 2019-05-27 ENCOUNTER — Encounter: Payer: Self-pay | Admitting: Adult Health

## 2019-05-27 ENCOUNTER — Other Ambulatory Visit: Payer: Self-pay

## 2019-05-27 VITALS — BP 130/80 | HR 82 | Temp 96.9°F | Resp 15 | Wt 176.0 lb

## 2019-05-27 DIAGNOSIS — R0989 Other specified symptoms and signs involving the circulatory and respiratory systems: Secondary | ICD-10-CM

## 2019-05-27 DIAGNOSIS — R42 Dizziness and giddiness: Secondary | ICD-10-CM

## 2019-05-27 DIAGNOSIS — I10 Essential (primary) hypertension: Secondary | ICD-10-CM

## 2019-05-27 NOTE — Progress Notes (Signed)
Patient: Natasha Phillips Female    DOB: September 15, 1950   68 y.o.   MRN: [id number] Visit Date: 05/27/2019  Today's Provider: Jairo Ben, FNP   Chief Complaint  Patient presents with  . Dizziness   Subjective:     Dizziness This is a recurrent (patient states that she has been checking her blood pressure readings at home 3x a day and states that it has been fluctuating) problem. The current episode started more than 1 month ago. The problem occurs constantly. The problem has been unchanged. Associated symptoms include vertigo. Pertinent negatives include no abdominal pain, anorexia, arthralgias, change in bowel habit, chest pain, chills, congestion, coughing, diaphoresis, fatigue, fever, headaches, joint swelling, myalgias, nausea, neck pain, numbness, rash, sore throat, swollen glands, urinary symptoms, visual change, vomiting or weakness. Treatments tried: PT.   Blood pressure has been 125-140 systolic and 62-82 diastolic.   Denies any falls. She is doing vestibular therapy. Denies any dizziness or lightheadedness with any position changes. She has ear pain that has improved. Tinnitus has resolved.  She limits caffeine. She is drinking 64 oz of water per day.  She denies headache.  She has neurology consult scheduled from her PCP with DR. Shaw 2020. She is taking Valsartan 80 mg daily only for blood pressure.She is not taking Norvasc 5 mg. Trace edema in bilateral ankle.  She sees Dr. Juliann Pares at cardiology.  She denies any new or changing symptoms since her last follow up with Dr. Sherrie Mustache or Dr. Juliann Pares just has the persistent dizziness.  She is using Flonase nasal spray as directed.   Patient  denies any fever, body aches,chills, rash, chest pain, shortness of breath, nausea, vomiting, or diarrhea.   .  Allergies  Allergen Reactions  . Januvia [Sitagliptin] Diarrhea  . Sulfa Antibiotics      Current Outpatient Medications:  .  metFORMIN (GLUCOPHAGE) 1000  MG tablet, Take 1 tablet (1,000 mg total) by mouth daily., Disp: 90 tablet, Rfl: 4 .  Multiple Vitamin (MULTIVITAMIN WITH MINERALS) TABS tablet, Take 1 tablet by mouth daily., Disp: , Rfl:  .  Omega-3 Fatty Acids (FISH OIL) 1000 MG CAPS, Take 1,000 mg by mouth daily., Disp: , Rfl:  .  pioglitazone (ACTOS) 45 MG tablet, Take 1 tablet (45 mg total) by mouth daily., Disp: 90 tablet, Rfl: 4 .  saxagliptin HCl (ONGLYZA) 5 MG TABS tablet, Take 1 tablet (5 mg total) by mouth daily., Disp: 90 tablet, Rfl: 4 .  valsartan (DIOVAN) 80 MG tablet, Take 1 tablet (80 mg total) by mouth daily., Disp: 30 tablet, Rfl: 1 .  vitamin C (ASCORBIC ACID) 500 MG tablet, Take 500 mg by mouth daily., Disp: , Rfl:  .  zinc sulfate 220 (50 Zn) MG capsule, Take 220 mg by mouth daily., Disp: , Rfl:  .  amLODipine (NORVASC) 5 MG tablet, Take 1 tablet (5 mg total) by mouth daily. Take 1/2-1 tablet twice daily based on home blood pressure readings (Patient not taking: Reported on 05/18/2019), Disp: , Rfl:   Review of Systems  Constitutional: Negative for chills, diaphoresis, fatigue and fever.  HENT: Negative for congestion, ear pain (patient reports fullness in ears and discomfort) and sore throat.   Respiratory: Negative for cough.   Cardiovascular: Negative for chest pain.  Gastrointestinal: Negative for abdominal pain, anorexia, change in bowel habit, nausea and vomiting.  Musculoskeletal: Negative for arthralgias, joint swelling, myalgias and neck pain.  Skin: Negative for rash.  Neurological: Positive for  dizziness and vertigo. Negative for weakness, numbness and headaches.    Social History   Tobacco Use  . Smoking status: Never Smoker  . Smokeless tobacco: Never Used  Substance Use Topics  . Alcohol use: No      Objective:   BP 130/80   Pulse 82   Temp (!) 96.9 F (36.1 C) (Oral)   Resp 15   Wt 176 lb (79.8 kg)   SpO2 99%   BMI 33.25 kg/m  Vitals:   05/27/19 1335  BP: 130/80  Pulse: 82  Resp: 15   Temp: (!) 96.9 F (36.1 C)  TempSrc: Oral  SpO2: 99%  Weight: 176 lb (79.8 kg)  Body mass index is 33.25 kg/m. Temporal thermometer   Physical Exam Vitals signs reviewed.  Constitutional:      General: She is not in acute distress.    Appearance: Normal appearance. She is not ill-appearing, toxic-appearing or diaphoretic.  HENT:     Head: Normocephalic and atraumatic.     Right Ear: Hearing, ear canal and external ear normal. A middle ear effusion is present. There is no impacted cerumen. No mastoid tenderness. Tympanic membrane is not erythematous.     Left Ear: Hearing, ear canal and external ear normal. A middle ear effusion is present. There is no impacted cerumen. No mastoid tenderness. Tympanic membrane is not erythematous.     Mouth/Throat:     Lips: Pink.     Tongue: No lesions.     Pharynx: Oropharynx is clear. Uvula midline.  Eyes:     Extraocular Movements: Extraocular movements intact.     Conjunctiva/sclera: Conjunctivae normal.     Pupils: Pupils are equal, round, and reactive to light.  Neck:     Musculoskeletal: Full passive range of motion without pain, normal range of motion and neck supple.     Trachea: Trachea and phonation normal.  Cardiovascular:     Rate and Rhythm: Normal rate and regular rhythm.     Pulses: Normal pulses.          Carotid pulses are 2+ on the right side and 2+ on the left side.      Radial pulses are 2+ on the right side and 2+ on the left side.       Femoral pulses are 2+ on the right side and 2+ on the left side.      Popliteal pulses are 2+ on the right side and 2+ on the left side.       Dorsalis pedis pulses are 2+ on the right side and 2+ on the left side.       Posterior tibial pulses are 2+ on the right side and 2+ on the left side.     Heart sounds: Normal heart sounds, S1 normal and S2 normal. No murmur. No friction rub. No gallop.   Pulmonary:     Effort: Pulmonary effort is normal. No respiratory distress.     Breath  sounds: Normal breath sounds. No stridor. No wheezing, rhonchi or rales.  Chest:     Chest wall: No tenderness.  Musculoskeletal: Normal range of motion.        General: No tenderness, deformity or signs of injury.     Right lower leg: Edema (trace medial ankle ) present.     Left lower leg: Edema (trace medial ankle ) present.  Skin:    General: Skin is warm and dry.     Capillary Refill: Capillary refill takes less than 2  seconds.     Nails: There is no clubbing.   Neurological:     General: No focal deficit present.     Mental Status: She is alert and oriented to person, place, and time.     GCS: GCS eye subscore is 4. GCS verbal subscore is 5. GCS motor subscore is 6.     Cranial Nerves: Cranial nerves are intact.     Sensory: Sensation is intact. No sensory deficit.     Motor: Motor function is intact. No weakness.     Coordination: Coordination is intact. Coordination normal.     Gait: Gait is intact. Gait normal.  Psychiatric:        Attention and Perception: Attention normal.        Mood and Affect: Mood normal.        Speech: Speech normal.        Behavior: Behavior normal. Behavior is cooperative.        Thought Content: Thought content normal.        Cognition and Memory: Cognition normal.        Judgment: Judgment normal.    Component     Latest Ref Rng & Units 05/03/2019          Sodium     134 - 144 mmol/L 141  Potassium     3.5 - 5.2 mmol/L 4.4  Chloride     96 - 106 mmol/L 104  CO2     20 - 29 mmol/L 25  Glucose     65 - 99 mg/dL 91  BUN     8 - 27 mg/dL 19  Creatinine     1.610.57 - 1.00 mg/dL 0.961.17 (H)  Calcium     8.7 - 10.3 mg/dL 04.510.2  GFR, Est Non African American     >59 mL/min/1.73 49 (L)  GFR, Est African American     >59 mL/min/1.73 56 (L)     No results found for any visits on 05/27/19.     Assessment & Plan    1. Dizziness Continue medication as directed. Change positions slowly. Keep Vestibular therapy appointments. Keep follow up  with cardiology.  She currently has her Norvasc on hold and has parameters on when to take.  She is currently taking Diovan 80 mg po daily. She is given advice on checking her blood pressure prior to taking medication and safety.   2. Essential (primary) hypertension Under control at this time. Medication review with patient.   3. Labile blood pressure Blood pressure check in office. Cardiology note review as well as lab reviews. Discussed hydration and eating well balanced meal. She has history of hyponatremia in past- labs are current.   Follow up in 1 month with Dr. Sherrie MustacheFisher and sooner if needed.  Discussed Red Flags and when to Call 911 and or seek emergency medical care.   Advised patient call the office or your primary care doctor for an appointment if no improvement within 72 hours or if any symptoms change or worsen at any time  Advised ER or urgent Care if after hours or on weekend. Call 911 for emergency symptoms at any time.Patinet verbalized understanding of all instructions given/reviewed and treatment plan and has no further questions or concerns at this time.      The entirety of the information documented in the History of Present Illness, Review of Systems and Physical Exam were personally obtained by me. Portions of this information were initially documented by the  Certified Medical Assistant whose name is documented in [place] and reviewed by me for thoroughness and accuracy.  I have personally performed the exam and reviewed the chart and it is accurate to the best of my knowledge.  Haematologist has been used and any errors in dictation or transcription are unintentional.  Kelby Aline. [place], [facility]

## 2019-05-27 NOTE — Patient Instructions (Signed)
Continue physical therapy. Follow up appointment with Dr. Caryn Section in one month.   Follow up with Cardiology Dr. Clayborn Bigness as recommended or if any new symptoms.  Advised patient call the office or your primary care doctor for an appointment if no improvement within 72 hours or if any symptoms change or worsen at any time  Advised ER or urgent Care if after hours or on weekend. Call 911 for emergency symptoms at any time.Patinet verbalized understanding of all instructions given/reviewed and treatment plan and has no further questions or concerns at this time.      Vertigo Vertigo is the feeling that you or the things around you are moving when they are not. This feeling can come and go at any time. Vertigo often goes away on its own. This condition can be dangerous if it happens when you are doing activities like driving or working with machines. Your doctor will do tests to find the cause of your vertigo. These tests will also help your doctor decide on the best treatment for you. Follow these instructions at home: Eating and drinking      Drink enough fluid to keep your pee (urine) pale yellow.  Do not drink alcohol. Activity  Return to your normal activities as told by your doctor. Ask your doctor what activities are safe for you.  In the morning, first sit up on the side of the bed. When you feel okay, stand slowly while you hold onto something until you know that your balance is fine.  Move slowly. Avoid sudden body or head movements or certain positions, as told by your doctor.  Use a cane if you have trouble standing or walking.  Sit down right away if you feel dizzy.  Avoid doing any tasks or activities that can cause danger to you or others if you get dizzy.  Avoid bending down if you feel dizzy. Place items in your home so that they are easy for you to reach without leaning over.  Do not drive or use heavy machinery if you feel dizzy. General instructions  Take  over-the-counter and prescription medicines only as told by your doctor.  Keep all follow-up visits as told by your doctor. This is important. Contact a doctor if:  Your medicine does not help your vertigo.  You have a fever.  Your problems get worse or you have new symptoms.  Your family or friends see changes in your behavior.  The feeling of being sick to your stomach gets worse.  Your vomiting gets worse.  You lose feeling (have numbness) in part of your body.  You feel prickling and tingling in a part of your body. Get help right away if:  You have trouble moving or talking.  You are always dizzy.  You pass out (faint).  You get very bad headaches.  You feel weak in your hands, arms, or legs.  You have changes in your hearing.  You have changes in how you see (vision).  You get a stiff neck.  Bright light starts to bother you. Summary  Vertigo is the feeling that you or the things around you are moving when they are not.  Your doctor will do tests to find the cause of your vertigo.  You may be told to avoid some tasks, positions, or movements.  Contact a doctor if your medicine is not helping, or if you have a fever, new symptoms, or a change in behavior.  Get help right away if you get  very bad headaches, or if you have changes in how you speak, hear, or see. This information is not intended to replace advice given to you by your health care provider. Make sure you discuss any questions you have with your health care provider. Document Released: 04/02/2008 Document Revised: 05/18/2018 Document Reviewed: 05/18/2018 Elsevier Patient Education  2020 [employer] Dizziness Dizziness is a common problem. It makes you feel unsteady or light-headed. You may feel like you are about to pass out (faint). Dizziness can lead to getting hurt if you stumble or fall. Dizziness can be caused by many things, including:  Medicines.  Not having enough water in your body  (dehydration).  Illness. Follow these instructions at home: Eating and drinking   Drink enough fluid to keep your pee (urine) clear or pale yellow. This helps to keep you from getting dehydrated. Try to drink more clear fluids, such as water.  Do not drink alcohol.  Limit how much caffeine you drink or eat, if your doctor tells you to do that.  Limit how much salt (sodium) you drink or eat, if your doctor tells you to do that. Activity   Avoid making quick movements. ? When you stand up from sitting in a chair, steady yourself until you feel okay. ? In the morning, first sit up on the side of the bed. When you feel okay, stand slowly while you hold onto something. Do this until you know that your balance is fine.  If you need to stand in one place for a long time, move your legs often. Tighten and relax the muscles in your legs while you are standing.  Do not drive or use heavy machinery if you feel dizzy.  Avoid bending down if you feel dizzy. Place items in your home so you can reach them easily without leaning over. Lifestyle  Do not use any products that contain nicotine or tobacco, such as cigarettes and e-cigarettes. If you need help quitting, ask your doctor.  Try to lower your stress level. You can do this by using methods such as yoga or meditation. Talk with your doctor if you need help. General instructions  Watch your dizziness for any changes.  Take over-the-counter and prescription medicines only as told by your doctor. Talk with your doctor if you think that you are dizzy because of a medicine that you are taking.  Tell a friend or a family member that you are feeling dizzy. If he or she notices any changes in your behavior, have this person call your doctor.  Keep all follow-up visits as told by your doctor. This is important. Contact a doctor if:  Your dizziness does not go away.  Your dizziness or light-headedness gets worse.  You feel sick to your  stomach (nauseous).  You have trouble hearing.  You have new symptoms.  You are unsteady on your feet.  You feel like the room is spinning. Get help right away if:  You throw up (vomit) or have watery poop (diarrhea), and you cannot eat or drink anything.  You have trouble: ? Talking. ? Walking. ? Swallowing. ? Using your arms, hands, or legs.  You feel generally weak.  You are not thinking clearly, or you have trouble forming sentences. A friend or family member may notice this.  You have: ? Chest pain. ? Pain in your belly (abdomen). ? Shortness of breath. ? Sweating.  Your vision changes.  You are bleeding.  You have a very bad headache.  You have neck pain or a stiff neck.  You have a fever. These symptoms may be an emergency. Do not wait to see if the symptoms will go away. Get medical help right away. Call your local emergency services (911 in the [place] Do not drive yourself to the hospital. Summary  Dizziness makes you feel unsteady or light-headed. You may feel like you are about to pass out (faint).  Drink enough fluid to keep your pee (urine) clear or pale yellow. Do not drink alcohol.  Avoid making quick movements if you feel dizzy.  Watch your dizziness for any changes. This information is not intended to replace advice given to you by your health care provider. Make sure you discuss any questions you have with your health care provider. Document Released: 06/13/2011 Document Revised: 06/27/2017 Document Reviewed: 07/11/2016 Elsevier Patient Education  2020 [employer]

## 2019-06-01 ENCOUNTER — Ambulatory Visit: Payer: Medicare Other | Admitting: Physical Therapy

## 2019-06-02 NOTE — Progress Notes (Signed)
Patient: Natasha Phillips Female    DOB: 1950/10/01   69 y.o.   MRN: [id number] Visit Date: 06/07/2019  Today's Provider: Mila Merry, MD   Chief Complaint  Patient presents with  . Diabetes  . Hypertension  . Dizziness   Subjective:     HPI  Diabetes Mellitus Type II, Follow-up:   Lab Results  Component Value Date   HGBA1C 5.9 (A) 02/02/2019   HGBA1C 6.3 (A) 09/16/2018   HGBA1C 6.4 (A) 03/12/2018    Last seen for diabetes 4 months ago.  Management since then includes no changes. She reports good compliance with treatment. She is not having side effects.  Current symptoms include none  Home blood sugar records: fasting range: 99-110  Episodes of hypoglycemia? no   Current insulin regiment: Is not on insulin Most Recent Eye Exam: 07/31/2018 Weight trend: stable Prior visit with dietician: No Current exercise: walking Current diet habits: in general, a "healthy" diet    Pertinent Labs:    Component Value Date/Time   CHOL 135 02/02/2019 0950   TRIG 48 02/02/2019 0950   HDL 62 02/02/2019 0950   LDLCALC 63 02/02/2019 0950   CREATININE 1.17 (H) 05/03/2019 1140    Wt Readings from Last 3 Encounters:  06/07/19 184 lb (83.5 kg)  05/27/19 176 lb (79.8 kg)  05/13/19 177 lb (80.3 kg)    ------------------------------------------------------------------------  Follow up for Dizziness:  The patient was last seen for this 11 days ago (seen by Juanito Doom, FNP). Changes made at last visit include none, but she had previously changed from amlodipine to valsartan, and has been using Allegra and fluticasone do to concern about potential fluid in ear.   She reports good compliance with treatment. She feels that condition is Improved. She is not having side effects.   ------------------------------------------------------------------------------------  Hypertension, follow-up:  BP Readings from Last 3 Encounters:  06/07/19 129/77  05/27/19 130/80   05/13/19 132/80    She was last seen for hypertension 11 days ago.  BP at that visit was 156/82. Management since that visit includes no changes. She reports good compliance with treatment. She is not having side effects.  She is exercising. She is not adherent to low salt diet.   Outside blood pressures are being checked at home. She is experiencing none.  Patient denies chest pain, chest pressure/discomfort, irregular heart beat and palpitations.   Cardiovascular risk factors include advanced age (older than 51 for men, 63 for women), diabetes mellitus and hypertension.  Use of agents associated with hypertension: none.     Weight trend: stable Wt Readings from Last 3 Encounters:  06/07/19 184 lb (83.5 kg)  05/27/19 176 lb (79.8 kg)  05/13/19 177 lb (80.3 kg)    Current diet: in general, a "healthy" diet    ------------------------------------------------------------------------  Allergies  Allergen Reactions  . Januvia [Sitagliptin] Diarrhea  . Sulfa Antibiotics      Current Outpatient Medications:  .  metFORMIN (GLUCOPHAGE) 1000 MG tablet, Take 1 tablet (1,000 mg total) by mouth daily., Disp: 90 tablet, Rfl: 4 .  Multiple Vitamin (MULTIVITAMIN WITH MINERALS) TABS tablet, Take 1 tablet by mouth daily., Disp: , Rfl:  .  Omega-3 Fatty Acids (FISH OIL) 1000 MG CAPS, Take 1,000 mg by mouth daily., Disp: , Rfl:  .  pioglitazone (ACTOS) 45 MG tablet, Take 1 tablet (45 mg total) by mouth daily., Disp: 90 tablet, Rfl: 4 .  saxagliptin HCl (ONGLYZA) 5 MG TABS tablet, Take  1 tablet (5 mg total) by mouth daily., Disp: 90 tablet, Rfl: 4 .  valsartan (DIOVAN) 80 MG tablet, Take 1 tablet (80 mg total) by mouth daily., Disp: 30 tablet, Rfl: 1 .  vitamin C (ASCORBIC ACID) 500 MG tablet, Take 500 mg by mouth daily., Disp: , Rfl:  .  zinc sulfate 220 (50 Zn) MG capsule, Take 220 mg by mouth daily., Disp: , Rfl:   Review of Systems  Constitutional: Negative for appetite change, chills,  fatigue and fever.  Respiratory: Negative for chest tightness and shortness of breath.   Cardiovascular: Negative for chest pain and palpitations.  Gastrointestinal: Negative for abdominal pain, nausea and vomiting.  Neurological: Negative for dizziness and weakness.    Social History   Tobacco Use  . Smoking status: Never Smoker  . Smokeless tobacco: Never Used  Substance Use Topics  . Alcohol use: No      Objective:   BP 129/77 (BP Location: Right Arm, Patient Position: Sitting, Cuff Size: Normal)   Pulse 81   Temp (!) 97.3 F (36.3 C) (Temporal)   Wt 184 lb (83.5 kg)   BMI 34.77 kg/m  Vitals:   06/07/19 0842  BP: 129/77  Pulse: 81  Temp: (!) 97.3 F (36.3 C)  TempSrc: Temporal  Weight: 184 lb (83.5 kg)  Body mass index is 34.77 kg/m.   Physical Exam   General Appearance:    Overweight female in no acute distress  Eyes:    PERRL, conjunctiva/corneas clear, EOM's intact       Lungs:     Clear to auscultation bilaterally, respirations unlabored  Heart:    Normal heart rate. Normal rhythm.  2/6 blowing, holosystolic murmur at apex  MS:   All extremities are intact.   Neurologic:   Awake, alert, oriented x 3. No apparent focal neurological           defect.        Results for orders placed or performed in visit on 06/07/19  POCT HgB A1C  Result Value Ref Range   Hemoglobin A1C 5.5 4.0 - 5.6 %   Est. average glucose Bld gHb Est-mCnc 111        Assessment & Plan    1. Controlled type 2 diabetes mellitus without complication, without long-term current use of insulin ([place]) Very well controlled, reduce- saxagliptin HCl (ONGLYZA) to 2.5 MG TABS tablet; Take 1 tablet (2.5 mg total) by mouth daily.  Dispense: 30 tablet; Refill: 5  2. Labile blood pressure Much better with change from amlodipine to valsartan. Continue current medications.    3. Dizziness Mostly resolved since change in BP medications as above.   Follow up 3-4 months   The entirety of the  information documented in the History of Present Illness, Review of Systems and Physical Exam were personally obtained by me. Portions of this information were initially documented by Meyer Cory, CMA and reviewed by me for thoroughness and accuracy.       Lelon Huh, MD  [facility]

## 2019-06-07 ENCOUNTER — Other Ambulatory Visit: Payer: Self-pay

## 2019-06-07 ENCOUNTER — Ambulatory Visit (INDEPENDENT_AMBULATORY_CARE_PROVIDER_SITE_OTHER): Payer: Medicare Other

## 2019-06-07 ENCOUNTER — Encounter: Payer: Self-pay | Admitting: Family Medicine

## 2019-06-07 ENCOUNTER — Ambulatory Visit (INDEPENDENT_AMBULATORY_CARE_PROVIDER_SITE_OTHER): Payer: Medicare Other | Admitting: Family Medicine

## 2019-06-07 VITALS — BP 129/77 | HR 81 | Temp 97.3°F | Wt 184.0 lb

## 2019-06-07 DIAGNOSIS — R42 Dizziness and giddiness: Secondary | ICD-10-CM | POA: Diagnosis not present

## 2019-06-07 DIAGNOSIS — R0989 Other specified symptoms and signs involving the circulatory and respiratory systems: Secondary | ICD-10-CM | POA: Diagnosis not present

## 2019-06-07 DIAGNOSIS — E119 Type 2 diabetes mellitus without complications: Secondary | ICD-10-CM

## 2019-06-07 DIAGNOSIS — Z0000 Encounter for general adult medical examination without abnormal findings: Secondary | ICD-10-CM

## 2019-06-07 LAB — POCT GLYCOSYLATED HEMOGLOBIN (HGB A1C)
Est. average glucose Bld gHb Est-mCnc: 111
Hemoglobin A1C: 5.5 % (ref 4.0–5.6)

## 2019-06-07 MED ORDER — ONGLYZA 2.5 MG PO TABS: 2.5000 mg | ORAL_TABLET | Freq: Every day | ORAL | 5 refills | Status: DC

## 2019-06-07 NOTE — Patient Instructions (Signed)
Natasha Phillips , Thank you for taking time to come for your Medicare Wellness Visit. I appreciate your ongoing commitment to your health goals. Please review the following plan we discussed and let me know if I can assist you in the future.   Screening recommendations/referrals: Colonoscopy: Up to date, due 09/29/21 Mammogram: Up to date, scheduled 08/09/19 Bone Density: Currently due, declined at this time.  Recommended yearly ophthalmology/optometry visit for glaucoma screening and checkup Recommended yearly dental visit for hygiene and checkup  Vaccinations: Influenza vaccine: Pt declines today.  Pneumococcal vaccine: Completed series Tdap vaccine: Up to date, due 02/06/22 Shingles vaccine: Pt declines today.     Advanced directives: Advance directive discussed with you today. Even though you declined this today please call our office should you change your mind and we can give you the proper paperwork for you to fill out.  Conditions/risks identified: Fall risk prevention discussed today.   Next appointment: 09/06/19 @ 8:40 AM with Dr Caryn Section.    Preventive Care 66 Years and Older, Female Preventive care refers to lifestyle choices and visits with your health care provider that can promote health and wellness. What does preventive care include?  A yearly physical exam. This is also called an annual well check.  Dental exams once or twice a year.  Routine eye exams. Ask your health care provider how often you should have your eyes checked.  Personal lifestyle choices, including:  Daily care of your teeth and gums.  Regular physical activity.  Eating a healthy diet.  Avoiding tobacco and drug use.  Limiting alcohol use.  Practicing safe sex.  Taking low-dose aspirin every day.  Taking vitamin and mineral supplements as recommended by your health care provider. What happens during an annual well check? The services and screenings done by your health care provider during your  annual well check will depend on your age, overall health, lifestyle risk factors, and family history of disease. Counseling  Your health care provider may ask you questions about your:  Alcohol use.  Tobacco use.  Drug use.  Emotional well-being.  Home and relationship well-being.  Sexual activity.  Eating habits.  History of falls.  Memory and ability to understand (cognition).  Work and work Statistician.  Reproductive health. Screening  You may have the following tests or measurements:  Height, weight, and BMI.  Blood pressure.  Lipid and cholesterol levels. These may be checked every 5 years, or more frequently if you are over 79 years old.  Skin check.  Lung cancer screening. You may have this screening every year starting at age 89 if you have a 30-pack-year history of smoking and currently smoke or have quit within the past 15 years.  Fecal occult blood test (FOBT) of the stool. You may have this test every year starting at age 26.  Flexible sigmoidoscopy or colonoscopy. You may have a sigmoidoscopy every 5 years or a colonoscopy every 10 years starting at age 82.  Hepatitis C blood test.  Hepatitis B blood test.  Sexually transmitted disease (STD) testing.  Diabetes screening. This is done by checking your blood sugar (glucose) after you have not eaten for a while (fasting). You may have this done every 1-3 years.  Bone density scan. This is done to screen for osteoporosis. You may have this done starting at age 21.  Mammogram. This may be done every 1-2 years. Talk to your health care provider about how often you should have regular mammograms. Talk with your health care  provider about your test results, treatment options, and if necessary, the need for more tests. Vaccines  Your health care provider may recommend certain vaccines, such as:  Influenza vaccine. This is recommended every year.  Tetanus, diphtheria, and acellular pertussis (Tdap, Td)  vaccine. You may need a Td booster every 10 years.  Zoster vaccine. You may need this after age 68.  Pneumococcal 13-valent conjugate (PCV13) vaccine. One dose is recommended after age 77.  Pneumococcal polysaccharide (PPSV23) vaccine. One dose is recommended after age 38. Talk to your health care provider about which screenings and vaccines you need and how often you need them. This information is not intended to replace advice given to you by your health care provider. Make sure you discuss any questions you have with your health care provider. Document Released: 07/21/2015 Document Revised: 03/13/2016 Document Reviewed: 04/25/2015 Elsevier Interactive Patient Education  2017 [employer] Prevention in the Home Falls can cause injuries. They can happen to people of all ages. There are many things you can do to make your home safe and to help prevent falls. What can I do on the outside of my home?  Regularly fix the edges of walkways and driveways and fix any cracks.  Remove anything that might make you trip as you walk through a door, such as a raised step or threshold.  Trim any bushes or trees on the path to your home.  Use bright outdoor lighting.  Clear any walking paths of anything that might make someone trip, such as rocks or tools.  Regularly check to see if handrails are loose or broken. Make sure that both sides of any steps have handrails.  Any raised decks and porches should have guardrails on the edges.  Have any leaves, snow, or ice cleared regularly.  Use sand or salt on walking paths during winter.  Clean up any spills in your garage right away. This includes oil or grease spills. What can I do in the bathroom?  Use night lights.  Install grab bars by the toilet and in the tub and shower. Do not use towel bars as grab bars.  Use non-skid mats or decals in the tub or shower.  If you need to sit down in the shower, use a plastic, non-slip stool.   Keep the floor dry. Clean up any water that spills on the floor as soon as it happens.  Remove soap buildup in the tub or shower regularly.  Attach bath mats securely with double-sided non-slip rug tape.  Do not have throw rugs and other things on the floor that can make you trip. What can I do in the bedroom?  Use night lights.  Make sure that you have a light by your bed that is easy to reach.  Do not use any sheets or blankets that are too big for your bed. They should not hang down onto the floor.  Have a firm chair that has side arms. You can use this for support while you get dressed.  Do not have throw rugs and other things on the floor that can make you trip. What can I do in the kitchen?  Clean up any spills right away.  Avoid walking on wet floors.  Keep items that you use a lot in easy-to-reach places.  If you need to reach something above you, use a strong step stool that has a grab bar.  Keep electrical cords out of the way.  Do not use floor polish  or wax that makes floors slippery. If you must use wax, use non-skid floor wax.  Do not have throw rugs and other things on the floor that can make you trip. What can I do with my stairs?  Do not leave any items on the stairs.  Make sure that there are handrails on both sides of the stairs and use them. Fix handrails that are broken or loose. Make sure that handrails are as long as the stairways.  Check any carpeting to make sure that it is firmly attached to the stairs. Fix any carpet that is loose or worn.  Avoid having throw rugs at the top or bottom of the stairs. If you do have throw rugs, attach them to the floor with carpet tape.  Make sure that you have a light switch at the top of the stairs and the bottom of the stairs. If you do not have them, ask someone to add them for you. What else can I do to help prevent falls?  Wear shoes that:  Do not have high heels.  Have rubber bottoms.  Are  comfortable and fit you well.  Are closed at the toe. Do not wear sandals.  If you use a stepladder:  Make sure that it is fully opened. Do not climb a closed stepladder.  Make sure that both sides of the stepladder are locked into place.  Ask someone to hold it for you, if possible.  Clearly mark and make sure that you can see:  Any grab bars or handrails.  First and last steps.  Where the edge of each step is.  Use tools that help you move around (mobility aids) if they are needed. These include:  Canes.  Walkers.  Scooters.  Crutches.  Turn on the lights when you go into a dark area. Replace any light bulbs as soon as they burn out.  Set up your furniture so you have a clear path. Avoid moving your furniture around.  If any of your floors are uneven, fix them.  If there are any pets around you, be aware of where they are.  Review your medicines with your doctor. Some medicines can make you feel dizzy. This can increase your chance of falling. Ask your doctor what other things that you can do to help prevent falls. This information is not intended to replace advice given to you by your health care provider. Make sure you discuss any questions you have with your health care provider. Document Released: 04/20/2009 Document Revised: 11/30/2015 Document Reviewed: 07/29/2014 Elsevier Interactive Patient Education  2017 [employer].

## 2019-06-07 NOTE — Progress Notes (Signed)
Subjective:   Natasha Phillips is a 68 y.o. female who presents for Medicare Annual (Initial) preventive examination.    This visit is being conducted through telemedicine due to the COVID-19 pandemic. This patient has given me verbal consent via doximity to conduct this visit, patient states they are participating from their home address. Some vital signs may be absent or patient reported.    Patient identification: identified by name, DOB, and current address  Review of Systems:  N/A  Cardiac Risk Factors include: advanced age (>76men, >24 women);diabetes mellitus     Objective:     Vitals: There were no vitals taken for this visit.  There is no height or weight on file to calculate BMI.  Advanced Directives 06/07/2019 05/18/2019 04/17/2019 04/17/2019 01/31/2019 10/13/2015 10/04/2015  Does Patient Have a Medical Advance Directive? No Yes No No No No No  Would patient like information on creating a medical advance directive? No - Patient declined - No - Patient declined - - - No - patient declined information    Tobacco Social History   Tobacco Use  Smoking Status Never Smoker  Smokeless Tobacco Never Used     Counseling given: Not Answered   Clinical Intake:  Pre-visit preparation completed: Yes  Pain : No/denies pain Pain Score: 0-No pain     Nutritional Risks: None Diabetes: Yes  How often do you need to have someone help you when you read instructions, pamphlets, or other written materials from your doctor or pharmacy?: 1 - Never   Diabetes:  Is the patient diabetic?  Yes  If diabetic, was a CBG obtained today?  No  Did the patient bring in their glucometer from home?  No  How often do you monitor your CBG's? Twice a day.   Financial Strains and Diabetes Management:  Are you having any financial strains with the device, your supplies or your medication? No .  Does the patient want to be seen by Chronic Care Management for management of their diabetes?   No  Would the patient like to be referred to a Nutritionist or for Diabetic Management?  No   Diabetic Exams:  Diabetic Eye Exam: Completed 07/31/18. Repeat yearly.  Diabetic Foot Exam: Completed 10/13/15. Pt has been advised about the importance in completing this exam. Note made to follow up on this at next in office visit.     Interpreter Needed?: No  Information entered by :: [facility], LPN  Past Medical History:  Diagnosis Date  . Anemia   . Diabetes mellitus without complication ([place])   . Hypertension    Past Surgical History:  Procedure Laterality Date  . BIOPSY THYROID    . [place]  . TUBAL LIGATION     Family History  Problem Relation Age of Onset  . Diabetes Mother   . Heart failure Mother   . Diabetes Sister   . Healthy Sister   . Healthy Sister   . Healthy Sister   . Diabetes Brother   . Diabetes Sister   . Heart failure Father   . Brain cancer Brother   . Throat cancer Brother   . Glaucoma Other        Runs on Paternal Side  . Breast cancer Cousin    Social History   Socioeconomic History  . Marital status: Widowed    Spouse name: Not on file  . Number of children: 2  . Years of education: College  . Highest  education level: Not on file  Occupational History  . 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 Financial resource strain: Not hard at all  . Food insecurity    Worry: Never true    Inability: Never true  . Transportation needs    Medical: No    Non-medical: No  Tobacco Use  . Smoking status: Never Smoker  . Smokeless tobacco: Never Used  Substance and Sexual Activity  . Alcohol use: No  . Drug use: No  . Sexual activity: Not Currently    Birth control/protection: None  Lifestyle  . Physical activity    Days per week: 0 days    Minutes per session: 0 min  . Stress: Not at all  Relationships  . Social Musician on phone: Patient refused    Gets  together: Patient refused    Attends religious service: Patient refused    Active member of club or organization: Patient refused    Attends meetings of clubs or organizations: Patient refused    Relationship status: Patient refused  Other Topics Concern  . Not on file  Social History Narrative  . Not on file    Outpatient Encounter Medications as of 06/07/2019  Medication Sig  . metFORMIN (GLUCOPHAGE) 1000 MG tablet Take 1 tablet (1,000 mg total) by mouth daily.  . Multiple Vitamin (MULTIVITAMIN WITH MINERALS) TABS tablet Take 1 tablet by mouth daily.  . Omega-3 Fatty Acids (FISH OIL) 1000 MG CAPS Take 1,000 mg by mouth daily.  . pioglitazone (ACTOS) 45 MG tablet Take 1 tablet (45 mg total) by mouth daily.  . valsartan (DIOVAN) 80 MG tablet Take 1 tablet (80 mg total) by mouth daily.  . vitamin C (ASCORBIC ACID) 500 MG tablet Take 500 mg by mouth daily.  Marland Kitchen zinc sulfate 220 (50 Zn) MG capsule Take 220 mg by mouth daily.  . [DISCONTINUED] saxagliptin HCl (ONGLYZA) 5 MG TABS tablet Take 1 tablet (5 mg total) by mouth daily.   No facility-administered encounter medications on file as of 06/07/2019.     Activities of Daily Living In your present state of health, do you have any difficulty performing the following activities: 06/07/2019 04/17/2019  Hearing? N N  Vision? N N  Difficulty concentrating or making decisions? N N  Walking or climbing stairs? N N  Dressing or bathing? N N  Doing errands, shopping? N N  Preparing Food and eating ? N -  Using the Toilet? N -  In the past six months, have you accidently leaked urine? N -  Do you have problems with loss of bowel control? N -  Managing your Medications? N -  Managing your Finances? N -  Housekeeping or managing your Housekeeping? N -  Some recent data might be hidden    Patient Care Team: Malva Limes, MD as PCP - General (Family Medicine) [facility] Phillips, [place] Vena Austria, MD as Referring Physician  (Obstetrics and Gynecology)    Assessment:   This is a routine wellness examination for [name].  Exercise Activities and Dietary recommendations Current Exercise Habits: Home exercise routine, Type of exercise: stretching, Time (Minutes): 20, Frequency (Times/Week): 2, Weekly Exercise (Minutes/Week): 40, Intensity: Mild, Exercise limited by: orthopedic condition(s)  Goals    . HEMOGLOBIN A1C < 7.0    . LIFESTYLE - DECREASE FALLS RISK     Recommend to remove any items from the home that may cause slips or  trips.    . Peak Blood Glucose < 180       Fall Risk: Fall Risk  06/07/2019 04/27/2018 03/12/2018 06/22/2015  Falls in the past year? 1 No No Yes  Number falls in past yr: 0 - - 1  Injury with Fall? 0 - - No  Follow up Falls prevention discussed - - -    FALL RISK PREVENTION PERTAINING TO THE HOME:  Any stairs in or around the home? Yes  If so, are there any without handrails? No   Home free of loose throw rugs in walkways, pet beds, electrical cords, etc? Yes  Adequate lighting in your home to reduce risk of falls? Yes   ASSISTIVE DEVICES UTILIZED TO PREVENT FALLS:  Life alert? No  Use of a cane, walker or w/c? No  Grab bars in the bathroom? Yes  Shower chair or bench in shower? No  Elevated toilet seat or a handicapped toilet? Yes   TIMED UP AND GO:  Was the test performed? No .    Depression Screen PHQ 2/9 Scores 06/07/2019 05/03/2019 04/27/2018 03/12/2018  PHQ - 2 Score 0 0 0 0  PHQ- 9 Score - - 0 0     Cognitive Function     6CIT Screen 06/07/2019  What Year? 0 points  What month? 0 points  What time? 0 points  Count back from 20 0 points  Months in reverse 0 points  Repeat phrase 0 points  Total Score 0    Immunization History  Administered Date(s) Administered  . Hepatitis B 09/20/2011, 11/01/2011, 04/03/2012  . Pneumococcal Conjugate-13 03/12/2018  . Pneumococcal Polysaccharide-23 05/19/2013  . Tdap 02/07/2012    Qualifies for Shingles  Vaccine? Yes . Due for Shingrix. Pt has been advised to call insurance company to determine out of pocket expense. Advised may also receive vaccine at local pharmacy or Health Dept. Verbalized acceptance and understanding.  Tdap: Up to date  Flu Vaccine: Due for Flu vaccine. Does the patient want to receive this vaccine today?  No .   Pneumococcal Vaccine: Completed series  Screening Tests Health Maintenance  Topic Date Due  . FOOT EXAM  10/12/2016  . DEXA SCAN  09/12/2017  . PNA vac Low Risk Adult (2 of 2 - PPSV23) 03/13/2019  . INFLUENZA VACCINE  10/06/2019 (Originally 02/06/2019)  . OPHTHALMOLOGY EXAM  08/01/2019  . HEMOGLOBIN A1C  12/05/2019  . MAMMOGRAM  02/13/2020  . COLONOSCOPY  09/29/2021  . TETANUS/TDAP  02/06/2022  . Hepatitis C Screening  Completed    Cancer Screenings:  Colorectal Screening: Completed 09/30/11. Repeat every 10 years.  Mammogram: Completed 02/12/18. Scheduled 08/09/19.  Bone Density: Currently due. Pt declined referral until Spring 2021.   Lung Cancer Screening: (Low Dose CT Chest recommended if Age 51-80 years, 30 pack-year currently smoking OR have quit w/in 15years.) does not qualify.   Additional Screening:  Hepatitis C Screening: Up to date  Dental Screening: Recommended annual dental exams for proper oral hygiene   Community Resource Referral:  CRR required this visit?  No       Plan:  I have personally reviewed and addressed the Medicare Annual Wellness questionnaire and have noted the following in the patient's chart:  A. Medical and social history B. Use of alcohol, tobacco or illicit drugs  C. Current medications and supplements D. Functional ability and status E.  Nutritional status F.  Physical activity G. Advance directives H. List of other physicians I.  Hospitalizations, surgeries, and ER visits  in previous 12 months J.  Vitals K. Screenings such as hearing and vision if needed, cognitive and depression L. Referrals and  appointments   In addition, I have reviewed and discussed with patient certain preventive protocols, quality metrics, and best practice recommendations. A written personalized care plan for preventive services as well as general preventive health recommendations were provided to patient.   Darrick HuntsmanSigned,    Latunya Kissick, [place]  [id number]/30/2020 Nurse Health Advisor   Nurse Notes: Pt needs a diabetic foot exam and Prevnar 13 vaccine at next in office apt. Pt declined DEXA referral today. Pt would like to schedule this in Spring 2021.

## 2019-06-07 NOTE — Patient Instructions (Signed)
.   Please review the attached list of medications and notify my office if there are any errors.   . Please bring all of your medications to every appointment so we can make sure that our medication list is the same as yours.   . It is especially important to get the annual flu vaccine this year. If you haven't had it already, please go to your pharmacy or call the office as soon as possible to schedule you flu shot.  

## 2019-06-08 ENCOUNTER — Ambulatory Visit: Payer: Medicare Other | Admitting: Physical Therapy

## 2019-06-10 ENCOUNTER — Other Ambulatory Visit: Payer: Self-pay | Admitting: Family Medicine

## 2019-06-11 DIAGNOSIS — H2513 Age-related nuclear cataract, bilateral: Secondary | ICD-10-CM | POA: Diagnosis not present

## 2019-06-11 DIAGNOSIS — H40053 Ocular hypertension, bilateral: Secondary | ICD-10-CM | POA: Diagnosis not present

## 2019-06-15 ENCOUNTER — Ambulatory Visit: Payer: Medicare Other | Admitting: Physical Therapy

## 2019-06-22 ENCOUNTER — Ambulatory Visit: Payer: Self-pay

## 2019-06-22 ENCOUNTER — Other Ambulatory Visit: Payer: Self-pay

## 2019-06-22 ENCOUNTER — Ambulatory Visit: Payer: Medicare Other | Attending: Otolaryngology | Admitting: Physical Therapy

## 2019-06-22 ENCOUNTER — Encounter: Payer: Self-pay | Admitting: Physical Therapy

## 2019-06-22 ENCOUNTER — Encounter: Payer: Self-pay | Admitting: Physician Assistant

## 2019-06-22 VITALS — BP 122/57

## 2019-06-22 DIAGNOSIS — R42 Dizziness and giddiness: Secondary | ICD-10-CM

## 2019-06-22 NOTE — Progress Notes (Signed)
Patient: Natasha Phillips Female    DOB: 1951/03/19   68 y.o.   MRN: [id number] Visit Date: 06/22/2019  Today's Provider: Margaretann Loveless, PA-C   No chief complaint on file.  Subjective:    I, Sulibeya S. Dimas, CMA, am acting as a scribe for Shirlee Latch, MD.  Virtual Visit via Video Note  I connected with Natasha Phillips on 06/22/19 at  3:40 PM EST by a video enabled telemedicine application and verified that I am speaking with the correct person using two identifiers.  Location: Patient: Home Provider: [facility]   I discussed the limitations of evaluation and management by telemedicine and the availability of in person appointments. The patient expressed understanding and agreed to proceed.   HPI  Patient did not want to go over her symptoms. Patient reports that she has gone over this with the triage nurse. Patient advised that I would copy and paste.  Garrison Columbus, RN Note   Pt c/o labile BP since July. Pt stated that her medication was changed from HCTZ and lisinopril to amlodipine and valsartan.  Pt stated that she has baseline dizziness (which she is PT for) but that the dizziness and worsened over the past few days. Pt stated her legs "feel heavy" and she has noted intermittent swelling to her feet. Pt also c/o intermittent  muscle cramping from her knees down. Pt stated that she is able to walk with the assistance of her cane and denies dizziness today.  Pt stated her diastolic BP is in the low 80's but today, her BP 120's over 57-65.Her dizziness had gotten better but more in last few days. Pt c/o  "heavy" legs and muscle cramping located knees down and swelling to her feet intermittently. Denies no other sx- is able to walk with use of cane.  Care advice given. Covid screening done but pt prefers a virtual visit. Virtual visit scheduled tomorrow at 3:40 with Joycelyn Man PA or her PCP (who had no openings until January).  Pt verbalized understanding  of care advice and appointment.  Reason for Disposition . [1] Fall in systolic BP > 20 mm Hg from normal AND [2] NOT dizzy, lightheaded, or weak  Answer Assessment - Initial Assessment Questions 1. BLOOD PRESSURE: "What is the blood pressure?" "Did you take at least two measurements 5 minutes apart?"     122/57 at PT 127/58   Before PT 130/65 2. ONSET: "When did you take your blood pressure?"     This am 0800 then minutes later 3. HOW: "How did you obtain the blood pressure?" (e.g., visiting nurse, automatic home BP monitor)     Automatic BO monitor 4. HISTORY: "Do you have a history of low blood pressure?" "What is your blood pressure normally?"     Yes- 122/80 or 127/80 or 135/82 5. MEDICATIONS: "Are you taking any medications for blood pressure?" If yes: "Have they been changed recently?"     Yes-yes 6. PULSE RATE: "Do you know what your pulse rate is?"      no 7. 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 PREGNANCY: "Is there any chance you are pregnant?" "When was your last menstrual period?"     n/a  Protocols used: LOW BLOOD PRESSURE-A-AH       In the triage  note it mentions she is taking amlodipine and valsartan. This is not the case. She is only taking Valsartan 80mg  once daily currently.   Allergies  Allergen Reactions  . Januvia [Sitagliptin] Diarrhea  . Sulfa Antibiotics      Current Outpatient Medications:  .  metFORMIN (GLUCOPHAGE) 1000 MG tablet, Take 1 tablet (1,000 mg total) by mouth daily., Disp: 90 tablet, Rfl: 4 .  Multiple Vitamin (MULTIVITAMIN WITH MINERALS) TABS tablet, Take 1 tablet by mouth daily., Disp: , Rfl:  .  Omega-3 Fatty Acids (FISH OIL) 1000 MG CAPS, Take 1,000 mg by mouth daily., Disp: , Rfl:  .  pioglitazone (ACTOS) 45 MG tablet, Take 1 tablet (45 mg total) by mouth daily.,  Disp: 90 tablet, Rfl: 4 .  saxagliptin HCl (ONGLYZA) 2.5 MG TABS tablet, Take 1 tablet (2.5 mg total) by mouth daily., Disp: 30 tablet, Rfl: 5 .  valsartan (DIOVAN) 80 MG tablet, TAKE 1 TABLET(80 MG) BY MOUTH DAILY, Disp: 90 tablet, Rfl: 0 .  vitamin C (ASCORBIC ACID) 500 MG tablet, Take 500 mg by mouth daily., Disp: , Rfl:  .  zinc sulfate 220 (50 Zn) MG capsule, Take 220 mg by mouth daily., Disp: , Rfl:   Review of Systems  Constitutional: Positive for fatigue.  HENT: Negative.   Eyes: Negative for visual disturbance.  Respiratory: Negative.   Cardiovascular: Positive for leg swelling. Negative for chest pain and palpitations.  Musculoskeletal: Positive for myalgias.  Neurological: Positive for dizziness, weakness and light-headedness. Negative for headaches.    Social History   Tobacco Use  . Smoking status: Never Smoker  . Smokeless tobacco: Never Used  Substance Use Topics  . Alcohol use: No      Objective:   BP (!) 122/57 (BP Location: Left Arm, Patient Position: Sitting, Cuff Size: Normal)   Wt 174 lb (78.9 kg)   BMI 32.88 kg/m  Vitals:   06/22/19 1438  BP: (!) 122/57  Weight: 174 lb (78.9 kg)  Body mass index is 32.88 kg/m.   Physical Exam Vitals reviewed.  Constitutional:      General: She is not in acute distress. Pulmonary:     Effort: No respiratory distress.  Neurological:     Mental Status: She is alert.      No results found for any visits on 06/23/19.     Assessment & Plan     1. Hypotension due to drugs SBP have been normal ranges, DBP have been in the 40s-50s. Suspect some dehydration as well as over medicated at this time. Since hospitalization with hyponatremia her BP has been running lower. Will decrease valsartan to 40mg  (0.5 tab). Push fluids. Return in 3 weeks for labs and BP recheck. Call if BP remains low and she is still having symptoms of lightheadedness.   2. Essential (primary) hypertension See above medical treatment plan. -  valsartan (DIOVAN) 80 MG tablet; Take 0.5 tablets (40 mg total) by mouth daily.  Dispense: 90 tablet; Refill: 0   I discussed the assessment and treatment plan with the patient. The patient was provided an opportunity to ask questions and all were answered. The patient agreed with the plan and demonstrated an understanding of the instructions.   The patient was advised to call back or seek an in-person evaluation if the symptoms worsen or if the condition fails to improve as anticipated.  I provided 20 minutes of non-face-to-face time during this encounter.      Mar Daring, PA-C  [facility]  [facility]  

## 2019-06-22 NOTE — Therapy (Signed)
[place] MAIN [facility] SERVICES [address] [place], [place], [address] Phone: [phone]   Fax:  [phone]  Physical Therapy Treatment  Patient Details  Name: Natasha Phillips MRN: [id number] Date of Birth: 01-17-1951 Referring Provider (PT): Dr. Pryor Ochoa   Encounter Date: 06/22/2019  PT End of Session - 06/22/19 1049    Visit Number  2    Number of Visits  9    Date for PT Re-Evaluation  07/13/19    PT Start Time  1039    PT Stop Time  1117    PT Time Calculation (min)  38 min    Equipment Utilized During Treatment  Gait belt    Activity Tolerance  Patient tolerated treatment well    Behavior During Therapy  [facility] for tasks assessed/performed       Past Medical History:  Diagnosis Date  . Anemia   . Diabetes mellitus without complication ([place])   . Hypertension     Past Surgical History:  Procedure Laterality Date  . BIOPSY THYROID    . [place]  . TUBAL LIGATION      Vitals:   06/22/19 1048 06/22/19 1110  BP: (!) 127/58 (!) 122/57    Subjective Assessment - 06/22/19 1042    Subjective  Patient states after the evaluation it recreated some of your symptoms and patient reports it took a week or more for her symptoms to calm back down. Patient reports she did not return to the clinic for the past month because she was afraid she would go backwards and she was nervous about the number of people in the gym.    Pertinent History  Patient reports she first started having dizziness in July 2020. Patient was seen by Dr. Caryn Section, Family Medicine, on 7/21 for acute otitis media at which time she was prescribed amoxicillin. Patient was seen in the ED on 7/26 and 7/27 for traumatic blister of oral cavity at which time she was prescribed a Medrol DosePak. Patient reports that she had a hot popcorn kernel lodged in the soft palate/roof of her mouth that she tried to use tweezers to extract. Patient reports her throat and  uvula were swollen. Patient reports she has been "out of whack" since that time and that her dizziness began after the ER visit. Patient reports the onset happened after she got water in her ear in the shower. Then, the next day she got up and had vertigo and had to hold on. She had several episodes of vertigo. Patient reports that after she took the antibiotic medicine the vertigo cleared up. Patient states her balance was off after this and she began to use a cane. She used the cane in the house. Patient reports now she does not use the cane at home, but takes the cane out in the community. Patient has labile HTN and patient reports that her physicians are still working on getting her blood pressures regulated. Patient reports she has a home blood pressure monitor so she can take her blood pressure. Patient reports when her blood pressure gets low or high she will get dizzy symptoms. Patient was seen by Dr. Pryor Ochoa, ENT physician, on 9/15 for evaluation of her dizziness symptoms. Patient reports her dizziness has gotten a lot better, but she still feels a few little pains and some fluid in her ear. Patient describes her dizziness as vertigo only at onset in July, unsteadiness, lightheadedness. Patient reports she  is currently getting dizziness once or twice a week and the episodes are not lasting as long as they were initially. Patient reports episodes can be 5 minutes or 15-20 minutes long.    Diagnostic tests  VNG: normal per patient report; brain MRI-chronic microvascular changes    Patient Stated Goals  to ambulate without a cane and to have decreased dizziness       Patient reports she thinks her symptoms might be due to some nutritional deficiencies. She said her diastolic pressure drops to the 60s at times and states she has a monitor and takes it at home.   Neuromuscular Re-education:  Patient reports that she is concerned about having her symptoms increase like they did last time. Performed in  sitting horizontal and vertical head turns multiple 30 second and then 1 minute reps. In sitting, performed cone follows moving cone horizontally and then vertically while tracking the cone with head and eyes with verbal cues for speed and technique. Repeated horizontal and vertical head turns while in static standing multiple 30 sec and 1 minute reps.  In standing, patient reports no dizziness symptoms, but states her legs feel "heavy".   Ambulation with head turns:  Patient performed 175' trials of forwards ambulation with horizontal and vertical head turns with CGA.   Patient reports 3/10 dizziness with horizontal head turns and 2/10 with vertical head turns.      PT Education - 06/23/19 1106    Education Details  discussed plan of care; discussed HEP and issued slide #15  from vestibular handout- ambulation with head turns    Person(s) Educated  Patient    Methods  Explanation;Handout;Demonstration    Comprehension  Verbalized understanding;Returned demonstration       PT Short Term Goals - 06/23/19 1630      PT SHORT TERM GOAL #1   Title  Pt will be independent with HEP in order to improve balance and decrease dizziness symptoms in order to decrease fall risk and improve function.    Time  4    Period  Weeks    Status  On-going    Target Date  06/15/19        PT Long Term Goals - 05/18/19 1343      PT LONG TERM GOAL #1   Title  Patient will demonstrate decreased falls risk as indicated by Activities Specific Balance Confidence Scale score of 80% or greater.    Time  8    Period  Weeks    Status  New    Target Date  07/13/19      PT LONG TERM GOAL #2   Title  Patient will report 50% or greater improvement in her symptoms of dizziness and imbalance with provoking motions or positions.    Time  8    Period  Weeks    Status  New    Target Date  07/13/19      PT LONG TERM GOAL #3   Title  Patient will be able to ambulate at self-selected gait speed of 1.0 M/s or  greater without AD without imbalance on level surfaces to be able to ambulate about her home and community safely.    Time  8    Period  Weeks    Status  New    Target Date  07/13/19            Plan - 06/23/19 1623    Clinical Impression Statement  Patient was last seen in the  clinic on 11/10 for the PT evaluation. Patient reports that her symptoms worsened for a week or more after the evaluation and patient states she did was nervous about returning to therapy due to Covid risk and she was afraid that her symptoms would "go backwards". Discussed plan of care and reassured patient that we would try to progress slowly per patient's tolerance. Patient reported 2-3/10 dizziness with activities this session. Patient was challenged by ambulation with head turns. Issued ambulation with horizontal and vertical head turns for HEP. Patient would benefit from PT services to address goals and deficits as set on plan of care.    Personal Factors and Comorbidities  Comorbidity 3+;Time since onset of injury/illness/exacerbation    Comorbidities  labile HTN, hyponatremia, anemia, DM    Examination-Activity Limitations  Locomotion Level;Stairs    Stability/Clinical Decision Making  Evolving/Moderate complexity    Rehab Potential  Good    PT Frequency  1x / week    PT Duration  8 weeks    PT Treatment/Interventions  Canalith Repostioning;Gait training;Stair training;Therapeutic activities;Therapeutic exercise;Balance training;Neuromuscular re-education;Vestibular;Patient/family education    PT Next Visit Plan  perform modified CTSIB and head thrust test; work on balance and vestibular exercises    Consulted and Agree with Plan of Care  Patient       Patient will benefit from skilled therapeutic intervention in order to improve the following deficits and impairments:  Decreased balance, Difficulty walking, Dizziness, Decreased strength  Visit Diagnosis: Dizziness and giddiness     Problem  List Patient Active Problem List   Diagnosis Date Noted  . Hyponatremia 04/17/2019  . Gastroesophageal reflux disease 09/16/2018  . Erosion of oral mucosa 10/28/2016  . Thyroid nodule 10/14/2015  . Abnormal cervical Papanicolaou smear 01/11/2015  . Absolute anemia 01/11/2015  . Mild mitral insufficiency 01/11/2015  . CN (constipation) 01/11/2015  . Calcium blood increased 01/11/2015  . LBP (low back pain) 01/11/2015  . Adiposity 01/11/2015  . Leg paresthesia 01/11/2015  . Neuralgia neuritis, sciatic nerve 01/11/2015  . Avitaminosis D 01/11/2015  . Essential (primary) hypertension 11/22/2014  . Controlled type 2 diabetes mellitus without complication (H[facility]) 11/22/2014   Mardelle Matte PT, DPT [phone] Mardelle Matte 06/23/2019, 4:31 PM  [facility] MAIN [facility] SERVICES [address] [place], [place], [address] Phone: [phone]   Fax:  [phone]  Name: SUNDAE MANERS MRN: [id number] Date of Birth: 02-17-1951

## 2019-06-22 NOTE — Telephone Encounter (Signed)
Pt c/o labile BP since July. Pt stated that her medication was changed from HCTZ and lisinopril to amlodipine and valsartan.  Pt stated that she has baseline dizziness (which she is PT for) but that the dizziness and worsened over the past few days. Pt stated her legs "feel heavy" and she has noted intermittent swelling to her feet. Pt also c/o intermittent  muscle cramping from her knees down. Pt stated that she is able to walk with the assistance of her cane and denies dizziness today.  Pt stated her diastolic BP is in the low 80's but today, her BP 120's over 57-65.Her dizziness had gotten better but more in last few days. Pt c/o  "heavy" legs and muscle cramping located knees down and swelling to her feet intermittently. Denies no other sx- is able to walk with use of cane.  Care advice given. Covid screening done but pt prefers a virtual visit. Virtual visit scheduled tomorrow at 3:40 with Fenton Malling PA or her PCP (who had no openings until January).  Pt verbalized understanding of care advice and appointment.  Reason for Disposition . [4] Fall in systolic BP > 20 mm Hg from normal AND [2] NOT dizzy, lightheaded, or weak  Answer Assessment - Initial Assessment Questions 1. BLOOD PRESSURE: "What is the blood pressure?" "Did you take at least two measurements 5 minutes apart?"     122/57 at PT 127/58   Before PT 130/65 2. ONSET: "When did you take your blood pressure?"     This am 0800 then minutes later 3. HOW: "How did you obtain the blood pressure?" (e.g., visiting nurse, automatic home BP monitor)     Automatic BO monitor 4. HISTORY: "Do you have a history of low blood pressure?" "What is your blood pressure normally?"     Yes- 122/80 or 127/80 or 135/82 5. MEDICATIONS: "Are you taking any medications for blood pressure?" If yes: "Have they been changed recently?"     Yes-yes 6. PULSE RATE: "Do you know what your pulse rate is?"      no 7. 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 PREGNANCY: "Is there any chance you are pregnant?" "When was your last menstrual period?"     n/a  Protocols used: LOW BLOOD PRESSURE-A-AH

## 2019-06-23 ENCOUNTER — Ambulatory Visit (INDEPENDENT_AMBULATORY_CARE_PROVIDER_SITE_OTHER): Payer: Medicare Other | Admitting: Physician Assistant

## 2019-06-23 VITALS — BP 122/57 | Wt 174.0 lb

## 2019-06-23 DIAGNOSIS — I952 Hypotension due to drugs: Secondary | ICD-10-CM

## 2019-06-23 DIAGNOSIS — I10 Essential (primary) hypertension: Secondary | ICD-10-CM

## 2019-06-23 MED ORDER — VALSARTAN 80 MG PO TABS: 40.0000 mg | ORAL_TABLET | Freq: Every day | ORAL | 0 refills | Status: DC

## 2019-06-24 ENCOUNTER — Encounter: Payer: Self-pay | Admitting: Physician Assistant

## 2019-07-06 ENCOUNTER — Ambulatory Visit: Payer: Medicare Other | Admitting: Physical Therapy

## 2019-07-12 ENCOUNTER — Encounter: Payer: Self-pay | Admitting: Physician Assistant

## 2019-07-12 ENCOUNTER — Ambulatory Visit (INDEPENDENT_AMBULATORY_CARE_PROVIDER_SITE_OTHER): Payer: Medicare Other | Admitting: Physician Assistant

## 2019-07-12 ENCOUNTER — Other Ambulatory Visit: Payer: Self-pay

## 2019-07-12 VITALS — BP 143/77 | HR 87 | Temp 96.9°F | Wt 188.0 lb

## 2019-07-12 DIAGNOSIS — E119 Type 2 diabetes mellitus without complications: Secondary | ICD-10-CM | POA: Diagnosis not present

## 2019-07-12 DIAGNOSIS — I10 Essential (primary) hypertension: Secondary | ICD-10-CM | POA: Diagnosis not present

## 2019-07-12 DIAGNOSIS — E041 Nontoxic single thyroid nodule: Secondary | ICD-10-CM | POA: Diagnosis not present

## 2019-07-12 DIAGNOSIS — E871 Hypo-osmolality and hyponatremia: Secondary | ICD-10-CM

## 2019-07-12 DIAGNOSIS — R600 Localized edema: Secondary | ICD-10-CM

## 2019-07-12 NOTE — Patient Instructions (Signed)
DASH Eating Plan DASH stands for "Dietary Approaches to Stop Hypertension." The DASH eating plan is a healthy eating plan that has been shown to reduce high blood pressure (hypertension). It may also reduce your risk for type 2 diabetes, heart disease, and stroke. The DASH eating plan may also help with weight loss. What are tips for following this plan?  General guidelines  Avoid eating more than 2,300 mg (milligrams) of salt (sodium) a day. If you have hypertension, you may need to reduce your sodium intake to 1,500 mg a day.  Limit alcohol intake to no more than 1 drink a day for nonpregnant women and 2 drinks a day for men. One drink equals 12 oz of beer, 5 oz of wine, or 1 oz of hard liquor.  Work with your health care provider to maintain a healthy body weight or to lose weight. Ask what an ideal weight is for you.  Get at least 30 minutes of exercise that causes your heart to beat faster (aerobic exercise) most days of the week. Activities may include walking, swimming, or biking.  Work with your health care provider or diet and nutrition specialist (dietitian) to adjust your eating plan to your individual calorie needs. Reading food labels   Check food labels for the amount of sodium per serving. Choose foods with less than 5 percent of the Daily Value of sodium. Generally, foods with less than 300 mg of sodium per serving fit into this eating plan.  To find whole grains, look for the word "whole" as the first word in the ingredient list. Shopping  Buy products labeled as "low-sodium" or "no salt added."  Buy fresh foods. Avoid canned foods and premade or frozen meals. Cooking  Avoid adding salt when cooking. Use salt-free seasonings or herbs instead of table salt or sea salt. Check with your health care provider or pharmacist before using salt substitutes.  Do not fry foods. Cook foods using healthy methods such as baking, boiling, grilling, and broiling instead.  Cook with  heart-healthy oils, such as olive, canola, soybean, or sunflower oil. Meal planning  Eat a balanced diet that includes: ? 5 or more servings of fruits and vegetables each day. At each meal, try to fill half of your plate with fruits and vegetables. ? Up to 6-8 servings of whole grains each day. ? Less than 6 oz of lean meat, poultry, or fish each day. A 3-oz serving of meat is about the same size as a deck of cards. One egg equals 1 oz. ? 2 servings of low-fat dairy each day. ? A serving of nuts, seeds, or beans 5 times each week. ? Heart-healthy fats. Healthy fats called Omega-3 fatty acids are found in foods such as flaxseeds and coldwater fish, like sardines, salmon, and mackerel.  Limit how much you eat of the following: ? Canned or prepackaged foods. ? Food that is high in trans fat, such as fried foods. ? Food that is high in saturated fat, such as fatty meat. ? Sweets, desserts, sugary drinks, and other foods with added sugar. ? Full-fat dairy products.  Do not salt foods before eating.  Try to eat at least 2 vegetarian meals each week.  Eat more home-cooked food and less restaurant, buffet, and fast food.  When eating at a restaurant, ask that your food be prepared with less salt or no salt, if possible. What foods are recommended? The items listed may not be a complete list. Talk with your dietitian about   what dietary choices are best for you. Grains Whole-grain or whole-wheat bread. Whole-grain or whole-wheat pasta. Brown rice. Oatmeal. Quinoa. Bulgur. Whole-grain and low-sodium cereals. Pita bread. Low-fat, low-sodium crackers. Whole-wheat flour tortillas. Vegetables Fresh or frozen vegetables (raw, steamed, roasted, or grilled). Low-sodium or reduced-sodium tomato and vegetable juice. Low-sodium or reduced-sodium tomato sauce and tomato paste. Low-sodium or reduced-sodium canned vegetables. Fruits All fresh, dried, or frozen fruit. Canned fruit in natural juice (without  added sugar). Meat and other protein foods Skinless chicken or turkey. Ground chicken or turkey. Pork with fat trimmed off. Fish and seafood. Egg whites. Dried beans, peas, or lentils. Unsalted nuts, nut butters, and seeds. Unsalted canned beans. Lean cuts of beef with fat trimmed off. Low-sodium, lean deli meat. Dairy Low-fat (1%) or fat-free (skim) milk. Fat-free, low-fat, or reduced-fat cheeses. Nonfat, low-sodium ricotta or cottage cheese. Low-fat or nonfat yogurt. Low-fat, low-sodium cheese. Fats and oils Soft margarine without trans fats. Vegetable oil. Low-fat, reduced-fat, or light mayonnaise and salad dressings (reduced-sodium). Canola, safflower, olive, soybean, and sunflower oils. Avocado. Seasoning and other foods Herbs. Spices. Seasoning mixes without salt. Unsalted popcorn and pretzels. Fat-free sweets. What foods are not recommended? The items listed may not be a complete list. Talk with your dietitian about what dietary choices are best for you. Grains Baked goods made with fat, such as croissants, muffins, or some breads. Dry pasta or rice meal packs. Vegetables Creamed or fried vegetables. Vegetables in a cheese sauce. Regular canned vegetables (not low-sodium or reduced-sodium). Regular canned tomato sauce and paste (not low-sodium or reduced-sodium). Regular tomato and vegetable juice (not low-sodium or reduced-sodium). Pickles. Olives. Fruits Canned fruit in a light or heavy syrup. Fried fruit. Fruit in cream or butter sauce. Meat and other protein foods Fatty cuts of meat. Ribs. Fried meat. Bacon. Sausage. Bologna and other processed lunch meats. Salami. Fatback. Hotdogs. Bratwurst. Salted nuts and seeds. Canned beans with added salt. Canned or smoked fish. Whole eggs or egg yolks. Chicken or turkey with skin. Dairy Whole or 2% milk, cream, and half-and-half. Whole or full-fat cream cheese. Whole-fat or sweetened yogurt. Full-fat cheese. Nondairy creamers. Whipped toppings.  Processed cheese and cheese spreads. Fats and oils Butter. Stick margarine. Lard. Shortening. Ghee. Bacon fat. Tropical oils, such as coconut, palm kernel, or palm oil. Seasoning and other foods Salted popcorn and pretzels. Onion salt, garlic salt, seasoned salt, table salt, and sea salt. Worcestershire sauce. Tartar sauce. Barbecue sauce. Teriyaki sauce. Soy sauce, including reduced-sodium. Steak sauce. Canned and packaged gravies. Fish sauce. Oyster sauce. Cocktail sauce. Horseradish that you find on the shelf. Ketchup. Mustard. Meat flavorings and tenderizers. Bouillon cubes. Hot sauce and Tabasco sauce. Premade or packaged marinades. Premade or packaged taco seasonings. Relishes. Regular salad dressings. Where to find more information:  National Heart, Lung, and Blood Institute: www.nhlbi.nih.gov  American Heart Association: www.heart.org Summary  The DASH eating plan is a healthy eating plan that has been shown to reduce high blood pressure (hypertension). It may also reduce your risk for type 2 diabetes, heart disease, and stroke.  With the DASH eating plan, you should limit salt (sodium) intake to 2,300 mg a day. If you have hypertension, you may need to reduce your sodium intake to 1,500 mg a day.  When on the DASH eating plan, aim to eat more fresh fruits and vegetables, whole grains, lean proteins, low-fat dairy, and heart-healthy fats.  Work with your health care provider or diet and nutrition specialist (dietitian) to adjust your eating plan to your   individual calorie needs. This information is not intended to replace advice given to you by your health care provider. Make sure you discuss any questions you have with your health care provider. Document Revised: 06/06/2017 Document Reviewed: 06/17/2016 Elsevier Patient Education  2020 Elsevier Inc.  

## 2019-07-12 NOTE — Progress Notes (Signed)
Patient: Natasha Phillips Female    DOB: Mar 27, 1951   69 y.o.   MRN: [id number] Visit Date: 07/12/2019  Today's Provider: Margaretann Loveless, PA-C   Chief Complaint  Patient presents with  . Follow-up    BP   Subjective:     HPI   Hypertension, follow-up:  BP Readings from Last 3 Encounters:  07/12/19 (!) 143/77  06/22/19 (!) 122/57  06/22/19 (!) 122/57    She was last seen for hypertension 1 months ago.  BP at that visit was 122/57. Management since that visit includes cutting valsartan in half. She reports excellent compliance with treatment. She is not having side effects.  She is not exercising. She is not adherent to low salt diet.   Outside blood pressures are fluctuating. She is experiencing none.  Patient denies chest pain and lower extremity edema.   Cardiovascular risk factors include advanced age (older than 62 for men, 82 for women), diabetes mellitus and hypertension.  Use of agents associated with hypertension: none.     Weight trend: increasing steadily Wt Readings from Last 3 Encounters:  07/12/19 188 lb (85.3 kg)  06/22/19 174 lb (78.9 kg)  06/07/19 184 lb (83.5 kg)    Current diet: not asked  DBP is improved and patient reports an improvement in dizziness. It does still occur some, but has improved. She does report that since stopping the HCTZ due to hyponatremia she is noticing she is holding fluid more and does have a noticeable weight gain compared to last time she was in about 2-3 weeks ago.  ------------------------------------------------------------------------  Allergies  Allergen Reactions  . Januvia [Sitagliptin] Diarrhea  . Sulfa Antibiotics      Current Outpatient Medications:  .  metFORMIN (GLUCOPHAGE) 1000 MG tablet, Take 1 tablet (1,000 mg total) by mouth daily., Disp: 90 tablet, Rfl: 4 .  Multiple Vitamin (MULTIVITAMIN WITH MINERALS) TABS tablet, Take 1 tablet by mouth daily., Disp: , Rfl:  .  Omega-3 Fatty Acids  (FISH OIL) 1000 MG CAPS, Take 1,000 mg by mouth daily., Disp: , Rfl:  .  pioglitazone (ACTOS) 45 MG tablet, Take 1 tablet (45 mg total) by mouth daily., Disp: 90 tablet, Rfl: 4 .  saxagliptin HCl (ONGLYZA) 2.5 MG TABS tablet, Take 1 tablet (2.5 mg total) by mouth daily., Disp: 30 tablet, Rfl: 5 .  valsartan (DIOVAN) 80 MG tablet, Take 0.5 tablets (40 mg total) by mouth daily., Disp: 90 tablet, Rfl: 0 .  vitamin C (ASCORBIC ACID) 500 MG tablet, Take 500 mg by mouth daily., Disp: , Rfl:  .  zinc sulfate 220 (50 Zn) MG capsule, Take 220 mg by mouth daily., Disp: , Rfl:   Review of Systems  Constitutional: Negative.   Respiratory: Negative.   Cardiovascular: Positive for leg swelling.  Gastrointestinal: Negative.   Genitourinary: Negative.   Neurological: Negative.     Social History   Tobacco Use  . Smoking status: Never Smoker  . Smokeless tobacco: Never Used  Substance Use Topics  . Alcohol use: No      Objective:   Temp (!) 96.9 F (36.1 C) (Temporal)   Wt 188 lb (85.3 kg)   BMI 35.52 kg/m  Vitals:   07/12/19 0914  Temp: (!) 96.9 F (36.1 C)  TempSrc: Temporal  Weight: 188 lb (85.3 kg)  Body mass index is 35.52 kg/m.   Physical Exam Vitals reviewed.  Constitutional:      General: She is not in acute distress.  Appearance: Normal appearance. She is well-developed. She is obese. She is not ill-appearing or diaphoretic.  HENT:     Head: Normocephalic and atraumatic.     Right Ear: Tympanic membrane, ear canal and external ear normal.     Left Ear: Tympanic membrane, ear canal and external ear normal.  Eyes:     General: No scleral icterus.    Extraocular Movements: Extraocular movements intact.     Pupils: Pupils are equal, round, and reactive to light.  Neck:     Thyroid: No thyromegaly.     Vascular: No JVD.     Trachea: No tracheal deviation.  Cardiovascular:     Rate and Rhythm: Normal rate and regular rhythm.     Pulses: Normal pulses.     Heart  sounds: Normal heart sounds. No murmur. No friction rub. No gallop.   Pulmonary:     Effort: Pulmonary effort is normal. No respiratory distress.     Breath sounds: Normal breath sounds. No wheezing or rales.  Musculoskeletal:     Cervical back: Normal range of motion and neck supple.     Right lower leg: Edema present.     Left lower leg: Edema present.  Lymphadenopathy:     Cervical: No cervical adenopathy.  Neurological:     Mental Status: She is alert.     No results found for any visits on 07/12/19.     Assessment & Plan    1. Essential (primary) hypertension BP is better and hypotension symptoms are improving, but patient is noticing holding more fluid. Continue valsartan 40mg  (0.5 tab of the 80mg ). Will check labs as below and f/u pending results. If normal may start furosemide daily prn for fluid and edema. She will need to take potassium supplement with furosemide.  - CBC w/Diff/Platelet - Comprehensive Metabolic Panel (CMET) - TSH - HgB A1c - furosemide (LASIX) 20 MG tablet; Take 1 tablet (20 mg total) by mouth daily as needed for fluid or edema.  Dispense: 30 tablet; Refill: 3 - potassium chloride SA (KLOR-CON) 20 MEQ tablet; Take 1 tablet (20 mEq total) by mouth daily as needed. Take with lasix (furosemide)  Dispense: 30 tablet; Refill: 3  2. Controlled type 2 diabetes mellitus without complication, without long-term current use of insulin ([place]) Has been stable on metformin 1000mg  daily and pioglitazone 45mg  daily. Will check labs as below and f/u pending results.  - CBC w/Diff/Platelet - Comprehensive Metabolic Panel (CMET) - TSH - HgB A1c  3. Thyroid nodule Stable. Will check labs as below and f/u pending results. - CBC w/Diff/Platelet - Comprehensive Metabolic Panel (CMET) - TSH - HgB A1c  4. Hyponatremia Improved with stopping HCTZ and replenishment in hospital. Will check labs as below and f/u pending results. - CBC w/Diff/Platelet - Comprehensive  Metabolic Panel (CMET) - TSH - HgB A1c  5. Bilateral lower extremity edema See above medical treatment plan for #1. - furosemide (LASIX) 20 MG tablet; Take 1 tablet (20 mg total) by mouth daily as needed for fluid or edema.  Dispense: 30 tablet; Refill: 3 - potassium chloride SA (KLOR-CON) 20 MEQ tablet; Take 1 tablet (20 mEq total) by mouth daily as needed. Take with lasix (furosemide)  Dispense: 30 tablet; Refill: [place], PA-C  [facility]

## 2019-07-13 LAB — COMPREHENSIVE METABOLIC PANEL
ALT: 14 IU/L (ref 0–32)
AST: 18 IU/L (ref 0–40)
Albumin/Globulin Ratio: 1.3 (ref 1.2–2.2)
Albumin: 4.2 g/dL (ref 3.8–4.8)
Alkaline Phosphatase: 108 IU/L (ref 39–117)
BUN/Creatinine Ratio: 26 (ref 12–28)
BUN: 28 mg/dL — ABNORMAL HIGH (ref 8–27)
Bilirubin Total: 0.2 mg/dL (ref 0.0–1.2)
CO2: 23 mmol/L (ref 20–29)
Calcium: 9.9 mg/dL (ref 8.7–10.3)
Chloride: 103 mmol/L (ref 96–106)
Creatinine, Ser: 1.06 mg/dL — ABNORMAL HIGH (ref 0.57–1.00)
GFR calc Af Amer: 63 mL/min/{1.73_m2} (ref >59)
GFR calc non Af Amer: 55 mL/min/{1.73_m2} — ABNORMAL LOW (ref >59)
Globulin, Total: 3.3 g/dL (ref 1.5–4.5)
Glucose: 88 mg/dL (ref 65–99)
Potassium: 4.4 mmol/L (ref 3.5–5.2)
Sodium: 141 mmol/L (ref 134–144)
Total Protein: 7.5 g/dL (ref 6.0–8.5)

## 2019-07-13 LAB — CBC WITH DIFFERENTIAL/PLATELET
Basophils Absolute: 0 10*3/uL (ref 0.0–0.2)
Basos: 0 %
EOS (ABSOLUTE): 0.1 10*3/uL (ref 0.0–0.4)
Eos: 3 %
Hematocrit: 34 % (ref 34.0–46.6)
Hemoglobin: 11.2 g/dL (ref 11.1–15.9)
Immature Grans (Abs): 0 10*3/uL (ref 0.0–0.1)
Immature Granulocytes: 0 %
Lymphocytes Absolute: 1.4 10*3/uL (ref 0.7–3.1)
Lymphs: 28 %
MCH: 30.9 pg (ref 26.6–33.0)
MCHC: 32.9 g/dL (ref 31.5–35.7)
MCV: 94 fL (ref 79–97)
Monocytes Absolute: 0.3 10*3/uL (ref 0.1–0.9)
Monocytes: 7 %
Neutrophils Absolute: 3.3 10*3/uL (ref 1.4–7.0)
Neutrophils: 62 %
Platelets: 255 10*3/uL (ref 150–450)
RBC: 3.63 x10E6/uL — ABNORMAL LOW (ref 3.77–5.28)
RDW: 12.5 % (ref 11.7–15.4)
WBC: 5.2 10*3/uL (ref 3.4–10.8)

## 2019-07-13 LAB — HEMOGLOBIN A1C
Est. average glucose Bld gHb Est-mCnc: 114 mg/dL
Hgb A1c MFr Bld: 5.6 % (ref 4.8–5.6)

## 2019-07-13 LAB — TSH: TSH: 1.85 u[IU]/mL (ref 0.450–4.500)

## 2019-07-13 MED ORDER — POTASSIUM CHLORIDE CRYS ER 20 MEQ PO TBCR: 20.0000 meq | EXTENDED_RELEASE_TABLET | Freq: Every day | ORAL | 3 refills | Status: DC | PRN

## 2019-07-13 MED ORDER — FUROSEMIDE 20 MG PO TABS: 20.0000 mg | ORAL_TABLET | Freq: Every day | ORAL | 3 refills | Status: DC | PRN

## 2019-07-15 ENCOUNTER — Telehealth: Payer: Self-pay

## 2019-07-15 NOTE — Telephone Encounter (Signed)
-----   Message from Margaretann Loveless, [place] sent at 07/13/2019  3:35 PM EST ----- Blood count is normal. Kidney function is normal. Liver enzymes is normal. Sodium, potassium and calcium are normal. Thyroid is normal. A1c/sugar is normal.

## 2019-07-15 NOTE — Telephone Encounter (Signed)
Yes I have sent this in to [facility] for her. She can take daily as needed for swelling and fluid retention

## 2019-07-15 NOTE — Telephone Encounter (Signed)
Copied from CRM [id number]. Topic: Quick Communication - Lab Results (Clinic Use ONLY) >> Jul 15, 2019  8:51 AM Leafy Ro wrote: Pt is calling and would like blood work results

## 2019-07-15 NOTE — Telephone Encounter (Signed)
Tried calling patient. Left message to call back. OK for PEC to advise patient.  

## 2019-07-15 NOTE — Telephone Encounter (Signed)
Pt advised.  She wanted to know if you would start her on a "fluid pill"   Pt uses [facility] in [place].  Cell Phone number: [phone]   Thanks,   -Vernona Rieger

## 2019-07-20 ENCOUNTER — Ambulatory Visit: Payer: Medicare Other | Admitting: Physical Therapy

## 2019-07-20 ENCOUNTER — Other Ambulatory Visit: Payer: Self-pay | Admitting: Family Medicine

## 2019-07-22 ENCOUNTER — Ambulatory Visit (INDEPENDENT_AMBULATORY_CARE_PROVIDER_SITE_OTHER): Payer: Medicare Other | Admitting: Internal Medicine

## 2019-07-22 ENCOUNTER — Other Ambulatory Visit: Payer: Self-pay

## 2019-07-22 VITALS — Ht 61.0 in | Wt 188.0 lb

## 2019-07-22 DIAGNOSIS — R600 Localized edema: Secondary | ICD-10-CM | POA: Diagnosis not present

## 2019-07-22 DIAGNOSIS — N182 Chronic kidney disease, stage 2 (mild): Secondary | ICD-10-CM

## 2019-07-22 DIAGNOSIS — E871 Hypo-osmolality and hyponatremia: Secondary | ICD-10-CM | POA: Diagnosis not present

## 2019-07-22 DIAGNOSIS — Z13818 Encounter for screening for other digestive system disorders: Secondary | ICD-10-CM

## 2019-07-22 DIAGNOSIS — E119 Type 2 diabetes mellitus without complications: Secondary | ICD-10-CM

## 2019-07-22 DIAGNOSIS — I10 Essential (primary) hypertension: Secondary | ICD-10-CM | POA: Diagnosis not present

## 2019-07-22 DIAGNOSIS — I959 Hypotension, unspecified: Secondary | ICD-10-CM

## 2019-07-22 NOTE — Progress Notes (Signed)
Virtual Visit via Video Note  I connected with Natasha Phillips  on 07/22/19 at  2:40 PM EST by a video enabled telemedicine application and verified that I am speaking with the correct person using two identifiers.  Location patient: home Location provider:work or home office Persons participating in the virtual visit: patient, provider  I discussed the limitations of evaluation and management by telemedicine and the availability of in person appointments. The patient expressed understanding and agreed to proceed.   HPI: 1. HTN variable BP was on fosinopril 10 mg hctz, norvasc 5 mg but BP dropping low. BP so low 01/2019 and she was dizzy with elevated Creatinine and calcium at this time she was on hctz 25 mg at this time which was stopped also due to low sodiumj. She is on diovan 80 mg qd changed by her prior PCP Dr. Sherrie Mustache 06/23/19. We disc this medication has been recalled  BP readings 106/55 1/13 123/72, 116/69, 128/73 2. CKD 2/3 monitoring by prior PCP  3. C/o leg edema though improved L>right at times sore and tight she is on lasix 20 mg qd she has had 1 week ago prn   4. DM 2 A1C 5.6 07/12/19 on metformin 1000 mg qd, Actos 45 mg qd, onglyza 2.5 mg qd   ROS: See pertinent positives and negatives per HPI. General: wt stable  HEENT: nl hearing  CV: no chest pain, + leg edema  Lungs: no sob GI: no ab pain  MSK: no jt pain  Neuro: no h/a  Skin: no issues Psych: mood stable  GU: +variable kidney function    Past Medical History:  Diagnosis Date  . Anemia   . Diabetes mellitus without complication (HCC)   . Glaucoma    [facility] with macular edema and Dr. Frederico Hamman sees Q3-4 months  . Hypertension     Past Surgical History:  Procedure Laterality Date  . BIOPSY THYROID    . CESAREAN SECTION     1981 1988  . TUBAL LIGATION      Family History  Problem Relation Age of Onset  . Diabetes Mother   . Heart failure Mother   . Diabetes Sister   . Healthy Sister   . Healthy  Sister   . Healthy Sister   . Diabetes Brother   . Diabetes Sister   . Heart failure Father   . Brain cancer Brother   . Throat cancer Brother   . Glaucoma Other        Runs on Paternal Side  . Breast cancer Cousin     SOCIAL HX:  Lives at home   Current Outpatient Medications:  .  BLACK ELDERBERRY PO, Take by mouth daily., Disp: , Rfl:  .  furosemide (LASIX) 20 MG tablet, Take 1 tablet (20 mg total) by mouth daily as needed for fluid or edema., Disp: 30 tablet, Rfl: 3 .  metFORMIN (GLUCOPHAGE) 1000 MG tablet, Take 1 tablet (1,000 mg total) by mouth daily., Disp: 90 tablet, Rfl: 4 .  Multiple Vitamin (MULTIVITAMIN WITH MINERALS) TABS tablet, Take 1 tablet by mouth daily., Disp: , Rfl:  .  Omega-3 Fatty Acids (FISH OIL) 1000 MG CAPS, Take 1,000 mg by mouth daily., Disp: , Rfl:  .  pioglitazone (ACTOS) 45 MG tablet, Take 1 tablet (45 mg total) by mouth daily., Disp: 90 tablet, Rfl: 4 .  potassium chloride SA (KLOR-CON) 20 MEQ tablet, Take 1 tablet (20 mEq total) by mouth daily as needed. Take with lasix (furosemide), Disp:  30 tablet, Rfl: 3 .  saxagliptin HCl (ONGLYZA) 2.5 MG TABS tablet, Take 1 tablet (2.5 mg total) by mouth daily., Disp: 30 tablet, Rfl: 5 .  vitamin C (ASCORBIC ACID) 500 MG tablet, Take 500 mg by mouth daily., Disp: , Rfl:  .  zinc sulfate 220 (50 Zn) MG capsule, Take 220 mg by mouth daily., Disp: , Rfl:  .  olmesartan (BENICAR) 20 MG tablet, Take 0.5 tablets (10 mg total) by mouth daily., Disp: 90 tablet, Rfl: 0  EXAM:  VITALS per patient if applicable:  GENERAL: alert, oriented, appears well and in no acute distress  HEENT: atraumatic, conjunttiva clear, no obvious abnormalities on inspection of external nose and ears  NECK: normal movements of the head and neck  LUNGS: on inspection no signs of respiratory distress, breathing rate appears normal, no obvious gross SOB, gasping or wheezing  CV: no obvious cyanosis  MS: moves all visible extremities without  noticeable abnormality  PSYCH/NEURO: pleasant and cooperative, no obvious depression or anxiety, speech and thought processing grossly intact  ASSESSMENT AND PLAN:  Discussed the following assessment and plan:  Essential hypertension - Plan: olmesartan (BENICAR) 10 MG tablet d/c diovan 80 mg, Basic Metabolic Panel (BMET), CBC with Differential/Platelet, Lipid panel Monitor BP  Next visit in office to assess volume status  Consider repeat echo in future   CKD (chronic kidney disease) stage 2, GFR 60-89 ml/min - Plan: Basic Metabolic Panel (BMET), CBC with Differential/Platelet, Antinuclear Antib (ANA) D/c diovan reduce BP med to benicar 10 mg qd maybe she is hypotensive causing elevated Cr.   Leg edema - Plan: Pro b natriuretic peptide (BNP) Prn lasix  Assess volume status at f/u  Consider repeat echo in future check pro BNP 1st   Hypotension, unspecified hypotension type - Plan: Cortisol  Hyponatremia - Plan: Basic Metabolic Panel (BMET) Could have been 2/2 hctz 25 which has been stopped also caused AKI   DM 2 controlled 5.6 07/12/19  -stop actos  Cont metformin only  Reassess   HM Flu shot disc at f/u prevnar utd  pna 23 consider in future  Tdap utd  Consider shingrix vaccines in future   Pap 05/13/19 negative neg HPV  mammo sch 08/09/19  Colonoscopy had in 2013  -need to get copy of report  dexa consider in future  Never smoker but exposed to 2nd hand via husband Consider repeat echo in future check proBnP 1st  -we discussed possible serious and likely etiologies, options for evaluation and workup, limitations of telemedicine visit vs in person visit, treatment, treatment risks and precautions. Pt prefers to treat via telemedicine empirically rather then risking or undertaking an in person visit at this moment. Patient agrees to seek prompt in person care if worsening, new symptoms arise, or if is not improving with treatment.   I discussed the assessment and treatment  plan with the patient. The patient was provided an opportunity to ask questions and all were answered. The patient agreed with the plan and demonstrated an understanding of the instructions.   The patient was advised to call back or seek an in-person evaluation if the symptoms worsen or if the condition fails to improve as anticipated.  Time spent 30 minutes  Delorise Jackson, MD

## 2019-07-23 MED ORDER — OLMESARTAN MEDOXOMIL 20 MG PO TABS: 10.0000 mg | ORAL_TABLET | Freq: Every day | ORAL | 0 refills | Status: DC

## 2019-07-27 ENCOUNTER — Encounter: Payer: Medicare Other | Admitting: Physical Therapy

## 2019-07-27 ENCOUNTER — Telehealth: Payer: Self-pay | Admitting: Internal Medicine

## 2019-07-27 NOTE — Telephone Encounter (Signed)
Pt said blood pressure has been lower than normal lately and wants to know if she should switch back to old blood pressure med. Please advise.

## 2019-08-02 NOTE — Telephone Encounter (Signed)
Pt is wondering why she has not received a call back. She was also supposed to be having EKG and haven't heard anything.

## 2019-08-03 ENCOUNTER — Encounter: Payer: Self-pay | Admitting: Internal Medicine

## 2019-08-03 DIAGNOSIS — N182 Chronic kidney disease, stage 2 (mild): Secondary | ICD-10-CM | POA: Insufficient documentation

## 2019-08-03 NOTE — Patient Instructions (Addendum)
Stop Actos  Stop Diovan/valsartan   Preventing Type 2 Diabetes Mellitus Type 2 diabetes (type 2 diabetes mellitus) is a long-term (chronic) disease that affects blood sugar (glucose) levels. Normally, a hormone called insulin allows glucose to enter cells in the body. The cells use glucose for energy. In type 2 diabetes, one or both of these problems may be present:  The body does not make enough insulin.  The body does not respond properly to insulin that it makes (insulin resistance). Insulin resistance or lack of insulin causes excess glucose to build up in the blood instead of going into cells. As a result, high blood glucose (hyperglycemia) develops, which can cause many complications. Being overweight or obese and having an inactive (sedentary) lifestyle can increase your risk for diabetes. Type 2 diabetes can be delayed or prevented by making certain nutrition and lifestyle changes. What nutrition changes can be made?   Eat healthy meals and snacks regularly. Keep a healthy snack with you for when you get hungry between meals, such as fruit or a handful of nuts.  Eat lean meats and proteins that are low in saturated fats, such as chicken, fish, egg whites, and beans. Avoid processed meats.  Eat plenty of fruits and vegetables and plenty of grains that have not been processed (whole grains). It is recommended that you eat: ? 1?2 cups of fruit every day. ? 2?3 cups of vegetables every day. ? 6?8 oz of whole grains every day, such as oats, whole wheat, bulgur, brown rice, quinoa, and millet.  Eat low-fat dairy products, such as milk, yogurt, and cheese.  Eat foods that contain healthy fats, such as nuts, avocado, olive oil, and canola oil.  Drink water throughout the day. Avoid drinks that contain added sugar, such as soda or sweet tea.  Follow instructions from your health care provider about specific eating or drinking restrictions.  Control how much food you eat at a time  (portion size). ? Check food labels to find out the serving sizes of foods. ? Use a kitchen scale to weigh amounts of foods.  Saute or steam food instead of frying it. Cook with water or broth instead of oils or butter.  Limit your intake of: ? Salt (sodium). Have no more than 1 tsp (2,400 mg) of sodium a day. If you have heart disease or high blood pressure, have less than ? tsp (1,500 mg) of sodium a day. ? Saturated fat. This is fat that is solid at room temperature, such as butter or fat on meat. What lifestyle changes can be made? Activity   Do moderate-intensity physical activity for at least 30 minutes on at least 5 days of the week, or as much as told by your health care provider.  Ask your health care provider what activities are safe for you. A mix of physical activities may be best, such as walking, swimming, cycling, and strength training.  Try to add physical activity into your day. For example: ? Park in spots that are farther away than usual, so that you walk more. For example, park in a far corner of the parking lot when you go to the office or the grocery store. ? Take a walk during your lunch break. ? Use stairs instead of elevators or escalators. Weight Loss  Lose weight as directed. Your health care provider can determine how much weight loss is best for you and can help you lose weight safely.  If you are overweight or obese, you may be  instructed to lose at least 5?7 % of your body weight. Alcohol and Tobacco   Limit alcohol intake to no more than 1 drink a day for nonpregnant women and 2 drinks a day for men. One drink equals 12 oz of beer, 5 oz of wine, or 1 oz of hard liquor.  Do not use any tobacco products, such as cigarettes, chewing tobacco, and e-cigarettes. If you need help quitting, ask your health care provider. Work With Your Health Care Provider  Have your blood glucose tested regularly, as told by your health care provider.  Discuss your risk  factors and how you can reduce your risk for diabetes.  Get screening tests as told by your health care provider. You may have screening tests regularly, especially if you have certain risk factors for type 2 diabetes.  Make an appointment with a diet and nutrition specialist (registered dietitian). A registered dietitian can help you make a healthy eating plan and can help you understand portion sizes and food labels. Why are these changes important?  It is possible to prevent or delay type 2 diabetes and related health problems by making lifestyle and nutrition changes.  It can be difficult to recognize signs of type 2 diabetes. The best way to avoid possible damage to your body is to take actions to prevent the disease before you develop symptoms. What can happen if changes are not made?  Your blood glucose levels may keep increasing. Having high blood glucose for a long time is dangerous. Too much glucose in your blood can damage your blood vessels, heart, kidneys, nerves, and eyes.  You may develop prediabetes or type 2 diabetes. Type 2 diabetes can lead to many chronic health problems and complications, such as: ? Heart disease. ? Stroke. ? Blindness. ? Kidney disease. ? Depression. ? Poor circulation in the feet and legs, which could lead to surgical removal (amputation) in severe cases. Where to find support  Ask your health care provider to recommend a registered dietitian, diabetes educator, or weight loss program.  Look for local or online weight loss groups.  Join a gym, fitness club, or outdoor activity group, such as a walking club. Where to find more information To learn more about diabetes and diabetes prevention, visit:  American Diabetes Association (ADA): www.diabetes.[employer] of Diabetes and Digestive and Kidney Diseases: ToyArticles.ca To learn more about healthy eating, visit:  The U.S. Department of Agriculture  Architect), Choose My Plate: http://yates.biz/  Office of Disease Prevention and Health Promotion (ODPHP), Dietary Guidelines: ListingMagazine.si Summary  You can reduce your risk for type 2 diabetes by increasing your physical activity, eating healthy foods, and losing weight as directed.  Talk with your health care provider about your risk for type 2 diabetes. Ask about any blood tests or screening tests that you need to have. This information is not intended to replace advice given to you by your health care provider. Make sure you discuss any questions you have with your health care provider. Document Revised: 10/16/2018 Document Reviewed: 08/15/2015 Elsevier Patient Education  2020 Elsevier Inc.  Edema  Edema is an abnormal buildup of fluids in the body tissues and under the skin. Swelling of the legs, feet, and ankles is a common symptom that becomes more likely as you get older. Swelling is also common in looser tissues, like around the eyes. When the affected area is squeezed, the fluid may move out of that spot and leave a dent for a few moments. This  dent is called pitting edema. There are many possible causes of edema. Eating too much salt (sodium) and being on your feet or sitting for a long time can cause edema in your legs, feet, and ankles. Hot weather may make edema worse. Common causes of edema include:  Heart failure.  Liver or kidney disease.  Weak leg blood vessels.  Cancer.  An injury.  Pregnancy.  Medicines.  Being obese.  Low protein levels in the blood. Edema is usually painless. Your skin may look swollen or shiny. Follow these instructions at home:  Keep the affected body part raised (elevated) above the level of your heart when you are sitting or lying down.  Do not sit still or stand for long periods of time.  Do not wear tight clothing. Do not wear garters on your upper legs.  Exercise your legs to get your circulation  going. This helps to move the fluid back into your blood vessels, and it may help the swelling go down.  Wear elastic bandages or support stockings to reduce swelling as told by your health care provider.  Eat a low-salt (low-sodium) diet to reduce fluid as told by your health care provider.  Depending on the cause of your swelling, you may need to limit how much fluid you drink (fluid restriction).  Take over-the-counter and prescription medicines only as told by your health care provider. Contact a health care provider if:  Your edema does not get better with treatment.  You have heart, liver, or kidney disease and have symptoms of edema.  You have sudden and unexplained weight gain. Get help right away if:  You develop shortness of breath or chest pain.  You cannot breathe when you lie down.  You develop pain, redness, or warmth in the swollen areas.  You have heart, liver, or kidney disease and suddenly get edema.  You have a fever and your symptoms suddenly get worse. Summary  Edema is an abnormal buildup of fluids in the body tissues and under the skin.  Eating too much salt (sodium) and being on your feet or sitting for a long time can cause edema in your legs, feet, and ankles.  Keep the affected body part raised (elevated) above the level of your heart when you are sitting or lying down. This information is not intended to replace advice given to you by your health care provider. Make sure you discuss any questions you have with your health care provider. Document Revised: 11/11/2018 Document Reviewed: 07/27/2016 Elsevier Patient Education  2020 Elsevier Inc.  DASH Eating Plan DASH stands for "Dietary Approaches to Stop Hypertension." The DASH eating plan is a healthy eating plan that has been shown to reduce high blood pressure (hypertension). It may also reduce your risk for type 2 diabetes, heart disease, and stroke. The DASH eating plan may also help with weight  loss. What are tips for following this plan?  General guidelines  Avoid eating more than 2,300 mg (milligrams) of salt (sodium) a day. If you have hypertension, you may need to reduce your sodium intake to 1,500 mg a day.  Limit alcohol intake to no more than 1 drink a day for nonpregnant women and 2 drinks a day for men. One drink equals 12 oz of beer, 5 oz of wine, or 1 oz of hard liquor.  Work with your health care provider to maintain a healthy body weight or to lose weight. Ask what an ideal weight is for you.  Get at  least 30 minutes of exercise that causes your heart to beat faster (aerobic exercise) most days of the week. Activities may include walking, swimming, or biking.  Work with your health care provider or diet and nutrition specialist (dietitian) to adjust your eating plan to your individual calorie needs. Reading food labels   Check food labels for the amount of sodium per serving. Choose foods with less than 5 percent of the Daily Value of sodium. Generally, foods with less than 300 mg of sodium per serving fit into this eating plan.  To find whole grains, look for the word "whole" as the first word in the ingredient list. Shopping  Buy products labeled as "low-sodium" or "no salt added."  Buy fresh foods. Avoid canned foods and premade or frozen meals. Cooking  Avoid adding salt when cooking. Use salt-free seasonings or herbs instead of table salt or sea salt. Check with your health care provider or pharmacist before using salt substitutes.  Do not fry foods. Cook foods using healthy methods such as baking, boiling, grilling, and broiling instead.  Cook with heart-healthy oils, such as olive, canola, soybean, or sunflower oil. Meal planning  Eat a balanced diet that includes: ? 5 or more servings of fruits and vegetables each day. At each meal, try to fill half of your plate with fruits and vegetables. ? Up to 6-8 servings of whole grains each day. ? Less than  6 oz of lean meat, poultry, or fish each day. A 3-oz serving of meat is about the same size as a deck of cards. One egg equals 1 oz. ? 2 servings of low-fat dairy each day. ? A serving of nuts, seeds, or beans 5 times each week. ? Heart-healthy fats. Healthy fats called Omega-3 fatty acids are found in foods such as flaxseeds and coldwater fish, like sardines, salmon, and mackerel.  Limit how much you eat of the following: ? Canned or prepackaged foods. ? Food that is high in trans fat, such as fried foods. ? Food that is high in saturated fat, such as fatty meat. ? Sweets, desserts, sugary drinks, and other foods with added sugar. ? Full-fat dairy products.  Do not salt foods before eating.  Try to eat at least 2 vegetarian meals each week.  Eat more home-cooked food and less restaurant, buffet, and fast food.  When eating at a restaurant, ask that your food be prepared with less salt or no salt, if possible. What foods are recommended? The items listed may not be a complete list. Talk with your dietitian about what dietary choices are best for you. Grains Whole-grain or whole-wheat bread. Whole-grain or whole-wheat pasta. Brown rice. Orpah Cobb. Bulgur. Whole-grain and low-sodium cereals. Pita bread. Low-fat, low-sodium crackers. Whole-wheat flour tortillas. Vegetables Fresh or frozen vegetables (raw, steamed, roasted, or grilled). Low-sodium or reduced-sodium tomato and vegetable juice. Low-sodium or reduced-sodium tomato sauce and tomato paste. Low-sodium or reduced-sodium canned vegetables. Fruits All fresh, dried, or frozen fruit. Canned fruit in natural juice (without added sugar). Meat and other protein foods Skinless chicken or [place]. Ground chicken or [place]. Pork with fat trimmed off. Fish and seafood. Egg whites. Dried beans, peas, or lentils. Unsalted nuts, nut butters, and seeds. Unsalted canned beans. Lean cuts of beef with fat trimmed off. Low-sodium, lean deli  meat. Dairy Low-fat (1%) or fat-free (skim) milk. Fat-free, low-fat, or reduced-fat cheeses. Nonfat, low-sodium ricotta or cottage cheese. Low-fat or nonfat yogurt. Low-fat, low-sodium cheese. Fats and oils Soft margarine without trans fats.  Vegetable oil. Low-fat, reduced-fat, or light mayonnaise and salad dressings (reduced-sodium). Canola, safflower, olive, soybean, and sunflower oils. Avocado. Seasoning and other foods Herbs. Spices. Seasoning mixes without salt. Unsalted popcorn and pretzels. Fat-free sweets. What foods are not recommended? The items listed may not be a complete list. Talk with your dietitian about what dietary choices are best for you. Grains Baked goods made with fat, such as croissants, muffins, or some breads. Dry pasta or rice meal packs. Vegetables Creamed or fried vegetables. Vegetables in a cheese sauce. Regular canned vegetables (not low-sodium or reduced-sodium). Regular canned tomato sauce and paste (not low-sodium or reduced-sodium). Regular tomato and vegetable juice (not low-sodium or reduced-sodium). Angie Fava. Olives. Fruits Canned fruit in a light or heavy syrup. Fried fruit. Fruit in cream or butter sauce. Meat and other protein foods Fatty cuts of meat. Ribs. Fried meat. Berniece Salines. Sausage. Bologna and other processed lunch meats. Salami. Fatback. Hotdogs. Bratwurst. Salted nuts and seeds. Canned beans with added salt. Canned or smoked fish. Whole eggs or egg yolks. Chicken or [place] with skin. Dairy Whole or 2% milk, cream, and half-and-half. Whole or full-fat cream cheese. Whole-fat or sweetened yogurt. Full-fat cheese. Nondairy creamers. Whipped toppings. Processed cheese and cheese spreads. Fats and oils Butter. Stick margarine. Lard. Shortening. Ghee. Bacon fat. Tropical oils, such as coconut, palm kernel, or palm oil. Seasoning and other foods Salted popcorn and pretzels. Onion salt, garlic salt, seasoned salt, table salt, and sea salt. Worcestershire  sauce. Tartar sauce. Barbecue sauce. Teriyaki sauce. Soy sauce, including reduced-sodium. Steak sauce. Canned and packaged gravies. Fish sauce. Oyster sauce. Cocktail sauce. Horseradish that you find on the shelf. Ketchup. Mustard. Meat flavorings and tenderizers. Bouillon cubes. Hot sauce and Tabasco sauce. Premade or packaged marinades. Premade or packaged taco seasonings. Relishes. Regular salad dressings. Where to find more information:  National Heart, Lung, and [place]: https://wilson-eaton.com/  American Heart Association: www.heart.org Summary  The DASH eating plan is a healthy eating plan that has been shown to reduce high blood pressure (hypertension). It may also reduce your risk for type 2 diabetes, heart disease, and stroke.  With the DASH eating plan, you should limit salt (sodium) intake to 2,300 mg a day. If you have hypertension, you may need to reduce your sodium intake to 1,500 mg a day.  When on the DASH eating plan, aim to eat more fresh fruits and vegetables, whole grains, lean proteins, low-fat dairy, and heart-healthy fats.  Work with your health care provider or diet and nutrition specialist (dietitian) to adjust your eating plan to your individual calorie needs. This information is not intended to replace advice given to you by your health care provider. Make sure you discuss any questions you have with your health care provider. Document Revised: 06/06/2017 Document Reviewed: 06/17/2016 Elsevier Patient Education  2020 [employer].

## 2019-08-03 NOTE — Telephone Encounter (Signed)
Please advise on this? Looks like there had been no response from 7 days ago? Also I do not see where anything has been ordered for patient?

## 2019-08-03 NOTE — Telephone Encounter (Signed)
Will discuss at next appt   Have pt monitor BP until then  I really dont want her taking diovan as it has been recalled due to is being cancer causing  We can consider norvasc 2.5 mg daily in the future   TMS

## 2019-08-03 NOTE — Telephone Encounter (Signed)
I ordered labs schedule asap here and f/u asap here fasting labs in office if I have appts Friday sch in office or anytime this week in am 8 am to 10 am needs appt and can do labs at this time   I wanted to do labs before echo  I wanted echo not EKG  She should stop diovan 80 mg daily and be on benicar 10 mg daily  -is she taking this?    What has her blood pressure been?

## 2019-08-03 NOTE — Telephone Encounter (Signed)
I called and spoke with patient. I have her scheduled for only avaiable appointment on Friday in person at 1p. She has also passed screening. She stated that she is not taking the benicar 10 mg bc it cauised her BP to go to too low into the 40's diastolic. She is still taking the Diovan 80 mg.

## 2019-08-04 DIAGNOSIS — R42 Dizziness and giddiness: Secondary | ICD-10-CM | POA: Diagnosis not present

## 2019-08-04 DIAGNOSIS — G939 Disorder of brain, unspecified: Secondary | ICD-10-CM | POA: Diagnosis not present

## 2019-08-05 NOTE — Telephone Encounter (Signed)
Left message for patient to return call back.  

## 2019-08-05 NOTE — Telephone Encounter (Signed)
Patient was informed.  Patient understood and no questions, comments, or concerns at this time. We will se patient at F/U tomorrow

## 2019-08-06 ENCOUNTER — Telehealth: Payer: Self-pay | Admitting: Internal Medicine

## 2019-08-06 ENCOUNTER — Encounter: Payer: Self-pay | Admitting: Internal Medicine

## 2019-08-06 ENCOUNTER — Ambulatory Visit (INDEPENDENT_AMBULATORY_CARE_PROVIDER_SITE_OTHER): Payer: Medicare Other | Admitting: Internal Medicine

## 2019-08-06 ENCOUNTER — Other Ambulatory Visit: Payer: Self-pay

## 2019-08-06 VITALS — BP 150/90 | HR 98 | Temp 98.1°F | Ht 61.0 in | Wt 186.6 lb

## 2019-08-06 DIAGNOSIS — I959 Hypotension, unspecified: Secondary | ICD-10-CM

## 2019-08-06 DIAGNOSIS — R600 Localized edema: Secondary | ICD-10-CM

## 2019-08-06 DIAGNOSIS — E871 Hypo-osmolality and hyponatremia: Secondary | ICD-10-CM | POA: Diagnosis not present

## 2019-08-06 DIAGNOSIS — L309 Dermatitis, unspecified: Secondary | ICD-10-CM

## 2019-08-06 DIAGNOSIS — N182 Chronic kidney disease, stage 2 (mild): Secondary | ICD-10-CM

## 2019-08-06 DIAGNOSIS — R252 Cramp and spasm: Secondary | ICD-10-CM | POA: Diagnosis not present

## 2019-08-06 DIAGNOSIS — I10 Essential (primary) hypertension: Secondary | ICD-10-CM

## 2019-08-06 DIAGNOSIS — E785 Hyperlipidemia, unspecified: Secondary | ICD-10-CM | POA: Diagnosis not present

## 2019-08-06 DIAGNOSIS — Z13818 Encounter for screening for other digestive system disorders: Secondary | ICD-10-CM

## 2019-08-06 DIAGNOSIS — E119 Type 2 diabetes mellitus without complications: Secondary | ICD-10-CM

## 2019-08-06 DIAGNOSIS — E2839 Other primary ovarian failure: Secondary | ICD-10-CM | POA: Diagnosis not present

## 2019-08-06 LAB — BASIC METABOLIC PANEL
BUN: 29 mg/dL — ABNORMAL HIGH (ref 6–23)
CO2: 30 mEq/L (ref 19–32)
Calcium: 10.5 mg/dL (ref 8.4–10.5)
Chloride: 102 mEq/L (ref 96–112)
Creatinine, Ser: 1.09 mg/dL (ref 0.40–1.20)
GFR: 60.6 mL/min (ref >60.00)
Glucose, Bld: 90 mg/dL (ref 70–99)
Potassium: 4.2 mEq/L (ref 3.5–5.1)
Sodium: 137 mEq/L (ref 135–145)

## 2019-08-06 LAB — D-DIMER, QUANTITATIVE: D-Dimer, Quant: 0.33 mcg/mL FEU (ref <0.50)

## 2019-08-06 MED ORDER — CARVEDILOL 3.125 MG PO TABS: 3.1250 mg | ORAL_TABLET | Freq: Two times a day (BID) | ORAL | 0 refills | Status: DC

## 2019-08-06 MED ORDER — TRIAMCINOLONE ACETONIDE 0.1 % EX CREA: 1.0000 "application " | TOPICAL_CREAM | Freq: Two times a day (BID) | CUTANEOUS | 0 refills | Status: DC

## 2019-08-06 MED ORDER — COREG 3.125 MG PO TABS: 3.1250 mg | ORAL_TABLET | Freq: Two times a day (BID) | ORAL | 0 refills | Status: DC

## 2019-08-06 NOTE — Telephone Encounter (Signed)
Patient would also like to have her BP medication sent in as name brand only not generic. Please advise.

## 2019-08-06 NOTE — Telephone Encounter (Signed)
Resent brand BP medication coreg  Pt did not want to go to [facility] at visit, is she agreeable now since no lab appts here?   If so rec she go fasting and as soon as they open orders in   TMS

## 2019-08-06 NOTE — Progress Notes (Signed)
Chief Complaint  Patient presents with  . Follow-up   F/u  1. Variable BP with hypotension and HTN BP today 178/102 repeat 150/90 on lasix 20 mg qd prn, she stopped benicar 10 mg qd due to bottle BP being in the 40s and started valsartan 80 mg qd prev Rx by former PCP  Previously on Fosinopril 10-40, lis 10 mg qd, hctz (caused hypoNa, AKI, hypercalcemia), norvasc 2.5-5 mg qd which caused BP to be too low as well per pt  She fears something is going on her her BP to be fluctuating like it is as this has been an issue since 01/2019 with former PCP   2. DM 2 07/12/19 A1C 5.6 we stopped actos and she is just on metformin 1000 mg bid qd and onglyza 2.5 mg qd   3. 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 C/o cramping in her legs though she is drinking enough water she even thought she was drinking too much water   5. C/o leg swelling L>R and black skin itchy changes to left lower leg   Former PCPS Dr. Dorinda Hill Fisher/Dr. Marolyn Haller   Review of Systems  Constitutional: Negative for weight loss.  HENT: Negative for hearing loss.   Eyes: Negative for blurred vision.  Respiratory: Negative for shortness of breath.   Cardiovascular: Positive for leg swelling. Negative for chest pain.  Musculoskeletal: Negative for falls.  Skin: Positive for itching and rash.  Neurological: Negative for headaches.  Psychiatric/Behavioral: Negative for depression and memory loss.   Past Medical History:  Diagnosis Date  . Anemia   . Diabetes mellitus without complication (HCC)   . Glaucoma    [facility] with macular edema and Dr. Frederico Hamman sees Q3-4 months  . Hypertension    Past Surgical History:  Procedure Laterality Date  . BIOPSY THYROID    . CESAREAN SECTION     1981 1988  . TUBAL LIGATION     Family History  Problem Relation Age of Onset  . Diabetes Mother   . Heart failure Mother   . Diabetes Sister   . Healthy Sister   . Healthy Sister   .  Healthy Sister   . Diabetes Brother   . Diabetes Sister   . Heart failure Father   . Brain cancer Brother   . Throat cancer Brother   . Glaucoma Other        Runs on Paternal Side  . Breast cancer Cousin    Social History   Socioeconomic History  . Marital status: Widowed    Spouse name: Not on file  . Number of children: 2  . Years of education: College  . Highest education level: Not on file  Occupational History  . 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 Smoking status: Never Smoker  . Smokeless tobacco: Never Used  Substance and Sexual Activity  . Alcohol use: No  . Drug use: No  . Sexual activity: Not Currently    Birth control/protection: None  Other Topics Concern  . Not on file  Social History Narrative   Lives at home    Social Determinants of Health   Financial Resource Strain: Low Risk   . Difficulty of Paying Living Expenses: Not hard at all  Food Insecurity: No Food Insecurity  . Worried About Programme researcher, broadcasting/film/video in the Last Year: Never true  .  Ran Out of Food in the Last Year: Never true  Transportation Needs: No Transportation Needs  . Lack of Transportation (Medical): No  . Lack of Transportation (Non-Medical): No  Physical Activity: Inactive  . Days of Exercise per Week: 0 days  . Minutes of Exercise per Session: 0 min  Stress: No Stress Concern Present  . Feeling of Stress : Not at all  Social Connections: Unknown  . Frequency of Communication with Friends and Family: Patient refused  . Frequency of Social Gatherings with Friends and Family: Patient refused  . Attends Religious Services: Patient refused  . Active Member of Clubs or Organizations: Patient refused  . Attends Banker Meetings: Patient refused  . Marital Status: Patient refused  Intimate Partner Violence: Unknown  . Fear of Current or Ex-Partner: Patient refused  . Emotionally Abused: Patient refused  . Physically Abused:  Patient refused  . Sexually Abused: Patient refused   Current Meds  Medication Sig  . BLACK ELDERBERRY PO Take by mouth daily.  . furosemide (LASIX) 20 MG tablet Take 1 tablet (20 mg total) by mouth daily as needed for fluid or edema.  . metFORMIN (GLUCOPHAGE) 1000 MG tablet Take 1 tablet (1,000 mg total) by mouth daily.  . Multiple Vitamin (MULTIVITAMIN WITH MINERALS) TABS tablet Take 1 tablet by mouth daily.  . Omega-3 Fatty Acids (FISH OIL) 1000 MG CAPS Take 1,000 mg by mouth daily.  . potassium chloride SA (KLOR-CON) 20 MEQ tablet Take 1 tablet (20 mEq total) by mouth daily as needed. Take with lasix (furosemide)  . saxagliptin HCl (ONGLYZA) 2.5 MG TABS tablet Take 1 tablet (2.5 mg total) by mouth daily.  . valsartan (DIOVAN) 80 MG tablet Take 80 mg by mouth daily.  . vitamin C (ASCORBIC ACID) 500 MG tablet Take 500 mg by mouth daily.  Marland Kitchen zinc sulfate 220 (50 Zn) MG capsule Take 220 mg by mouth daily.   Allergies  Allergen Reactions  . Hctz [Hydrochlorothiazide]     Low na 124   . Januvia [Sitagliptin] Diarrhea  . Sulfa Antibiotics    Recent Results (from the past 2160 hour(s))  Cytology - PAP     Status: None   Collection Time: 05/13/19  8:39 AM  Result Value Ref Range   Adequacy      Satisfactory for evaluation; transformation zone component PRESENT.   Diagnosis      - Negative for intraepithelial lesion or malignancy (NILM)  POCT HgB A1C     Status: None   Collection Time: 06/07/19  8:46 AM  Result Value Ref Range   Hemoglobin A1C 5.5 4.0 - 5.6 %   Est. average glucose Bld gHb Est-mCnc 111   CBC w/Diff/Platelet     Status: Abnormal   Collection Time: 07/12/19  9:54 AM  Result Value Ref Range   WBC 5.2 3.4 - 10.8 x10E3/uL   RBC 3.63 (L) 3.77 - 5.28 x10E6/uL   Hemoglobin 11.2 11.1 - 15.9 g/dL   Hematocrit 51.0 25.8 - 46.6 %   MCV 94 79 - 97 fL   MCH 30.9 26.6 - 33.0 pg   MCHC 32.9 31.5 - 35.7 g/dL   RDW 52.7 78.2 - 42.3 %   Platelets 255 150 - 450 x10E3/uL    Neutrophils 62 Not Estab. %   Lymphs 28 Not Estab. %   Monocytes 7 Not Estab. %   Eos 3 Not Estab. %   Basos 0 Not Estab. %   Neutrophils Absolute 3.3 1.4 -  7.0 x10E3/uL   Lymphocytes Absolute 1.4 0.7 - 3.1 x10E3/uL   Monocytes Absolute 0.3 0.1 - 0.9 x10E3/uL   EOS (ABSOLUTE) 0.1 0.0 - 0.4 x10E3/uL   Basophils Absolute 0.0 0.0 - 0.2 x10E3/uL   Immature Granulocytes 0 Not Estab. %   Immature Grans (Abs) 0.0 0.0 - 0.1 x10E3/uL  Comprehensive Metabolic Panel (CMET)     Status: Abnormal   Collection Time: 07/12/19  9:54 AM  Result Value Ref Range   Glucose 88 65 - 99 mg/dL   BUN 28 (H) 8 - 27 mg/dL   Creatinine, Ser 1.06 (H) 0.57 - 1.00 mg/dL   GFR calc non Af Amer 55 (L) >59 mL/min/1.73   GFR calc Af Amer 63 >59 mL/min/1.73   BUN/Creatinine Ratio 26 12 - 28   Sodium 141 134 - 144 mmol/L   Potassium 4.4 3.5 - 5.2 mmol/L   Chloride 103 96 - 106 mmol/L   CO2 23 20 - 29 mmol/L   Calcium 9.9 8.7 - 10.3 mg/dL   Total Protein 7.5 6.0 - 8.5 g/dL   Albumin 4.2 3.8 - 4.8 g/dL   Globulin, Total 3.3 1.5 - 4.5 g/dL   Albumin/Globulin Ratio 1.3 1.2 - 2.2   Bilirubin Total 0.2 0.0 - 1.2 mg/dL   Alkaline Phosphatase 108 39 - 117 IU/L   AST 18 0 - 40 IU/L   ALT 14 0 - 32 IU/L  TSH     Status: None   Collection Time: 07/12/19  9:54 AM  Result Value Ref Range   TSH 1.850 0.450 - 4.500 uIU/mL  HgB A1c     Status: None   Collection Time: 07/12/19  9:54 AM  Result Value Ref Range   Hgb A1c MFr Bld 5.6 4.8 - 5.6 %    Comment:          Prediabetes: 5.7 - 6.4          Diabetes: >6.4          Glycemic control for adults with diabetes: <7.0    Est. average glucose Bld gHb Est-mCnc 114 mg/dL  Pro b natriuretic peptide (BNP)     Status: None   Collection Time: 08/06/19  1:43 PM  Result Value Ref Range   NT-Pro BNP 70 0 - 301 pg/mL    Comment: The following cut-points have been suggested for the use of proBNP for the diagnostic evaluation of heart failure (HF) in patients with acute  dyspnea: Modality                     Age           Optimal Cut                            (years)            Point ------------------------------------------------------ Diagnosis (rule in HF)        <50            450 pg/mL                           50 - 75            900 pg/mL                               >75  1800 pg/mL Exclusion (rule out HF)  Age independent     300 pg/mL   Antinuclear Antib (ANA)     Status: None   Collection Time: 08/06/19  1:43 PM  Result Value Ref Range   Anti Nuclear Antibody (ANA) NEGATIVE NEGATIVE    Comment: ANA IFA is a first line screen for detecting the presence of up to approximately 150 autoantibodies in various autoimmune diseases. A negative ANA IFA result suggests an ANA-associated autoimmune disease is not present at this time, but is not definitive. If there is high clinical suspicion for Sjogren's syndrome, testing for anti-SS-A/Ro antibody should be considered. Anti-Jo-1 antibody should be considered for clinically suspected inflammatory myopathies. . AC-0: Negative . International Consensus on ANA Patterns (SeverTies.uyhttps://doi.org/10.1515/cclm-2018-0052) . For additional information, please refer to http://education.[facility].com/faq/FAQ177 (This link is being provided for informational/ educational purposes only.) .   Basic Metabolic Panel (BMET)     Status: Abnormal   Collection Time: 08/06/19  1:43 PM  Result Value Ref Range   Sodium 137 135 - 145 mEq/L   Potassium 4.2 3.5 - 5.1 mEq/L   Chloride 102 96 - 112 mEq/L   CO2 30 19 - 32 mEq/L   Glucose, Bld 90 70 - 99 mg/dL   BUN 29 (H) 6 - 23 mg/dL   Creatinine, Ser 4.091.09 0.40 - 1.20 mg/dL   GFR 81.1960.60 >14.78>60.00 mL/min   Calcium 10.5 8.4 - 10.5 mg/dL  D-Dimer, Quantitative     Status: None   Collection Time: 08/06/19  1:43 PM  Result Value Ref Range   D-Dimer, Quant 0.33 <0.50 mcg/mL FEU    Comment: . The D-Dimer test is used frequently to exclude an acute PE or DVT. In  patients with a low to moderate clinical risk assessment and a D-Dimer result <0.50 mcg/mL FEU, the likelihood of a PE or DVT is very low. However, a thromboembolic event should not be excluded solely on the basis of the D-Dimer level. Increased levels of D-Dimer are associated with a PE, DVT, DIC, malignancies, inflammation, sepsis, surgery, trauma, pregnancy, and advancing patient age. [Jama 2006 11:295(2):199-207] . For additional information, please refer to: http://education.[facility].com/faq/FAQ149 (This link is being provided for informational/ educational purposes only) .    Objective  Body mass index is 35.26 kg/m. Wt Readings from Last 3 Encounters:  08/06/19 186 lb 9.6 oz (84.6 kg)  07/22/19 188 lb (85.3 kg)  07/12/19 188 lb (85.3 kg)   Temp Readings from Last 3 Encounters:  08/06/19 98.1 F (36.7 C) (Skin)  07/12/19 (!) 96.9 F (36.1 C) (Temporal)  06/07/19 (!) 97.3 F (36.3 C) (Temporal)   BP Readings from Last 3 Encounters:  08/06/19 (!) 150/90  07/12/19 (!) 143/77  06/22/19 (!) 122/57   Pulse Readings from Last 3 Encounters:  08/06/19 98  07/12/19 87  06/07/19 81    Physical Exam Vitals and nursing note reviewed.  Constitutional:      Appearance: Normal appearance. She is well-developed and well-groomed.  HENT:     Head: Normocephalic and atraumatic.  Eyes:     Conjunctiva/sclera: Conjunctivae normal.     Pupils: Pupils are equal, round, and reactive to light.  Cardiovascular:     Rate and Rhythm: Normal rate and regular rhythm.     Heart sounds: Normal heart sounds. No murmur.     Comments: 1+ edema b/l legs L>R Pulmonary:     Effort: Pulmonary effort is normal.     Breath sounds: Normal breath sounds.  Abdominal:  General: Abdomen is flat. Bowel sounds are normal.     Tenderness: There is no abdominal tenderness.  Musculoskeletal:     Right lower leg: 1+ Pitting Edema present.     Left lower leg: 1+ Pitting Edema present.   Skin:    General: Skin is warm and dry.  Neurological:     General: No focal deficit present.     Mental Status: She is alert and oriented to person, place, and time. Mental status is at baseline.     Gait: Gait normal.  Psychiatric:        Attention and Perception: Attention and perception normal.        Mood and Affect: Mood and affect normal.        Speech: Speech normal.        Behavior: Behavior normal. Behavior is cooperative.        Thought Content: Thought content normal.        Cognition and Memory: Cognition and memory normal.        Judgment: Judgment normal.     Assessment  Plan  Essential hypertension with CKD 2 - Plan: Basic Metabolic Panel (BMET), COREG 3.125 MG tablet bid  Stop diovan 80 as inform pt med has been recalled  Dermatitis likely stasis left lower leg- Plan: triamcinolone cream (KENALOG) 0.1 % bid prn  Leg edema - Plan: Pro b natriuretic peptide (BNP), D-Dimer negative not DVT  Consider echo  Limit fluid to 50-<64 oz daily   Hyponatremia - Plan: Basic Metabolic Panel (BMET) This was likely 2/2 hctz in the past  She has never been a smoker  Could consider CXR to r/o lung mass as etiology though low susp.   Hypotension, unspecified hypotension type - Plan: D-Dimer negative  Cortisol will do in am 8-10 am [facility] no appts in office here  Consider echo, renal US   Hyperlipidemia, unspecified hyperlipidemia type - Plan: Lipid panel at [facility] in future   Controlled type 2 diabetes mellitus without complication, without long-term current use of insulin (HCC) Off actos continue metformin and onlgyza monitor A1C last 07/12/19 5.6  Eye exam disc at f/u  Foot exam do at f/u   Leg cramps  Hydration with water check bmet   HM Flu shot declines  prevnar utd  pna 23 consider in future  Tdap utd  Consider shingrix vaccines in future   Pap 05/13/19 negative neg HPV  mammo sch 08/09/19  Colonoscopy had in 3/252013 per pt she was ~61 per chart review was  normal though I am unable to review report pt states she had at [facility] -need to get copy of report  dexa consider in future will try to get pt scheduled  Never smoker but exposed to 2nd hand via husband Consider renal US duplex and echo in the future given above disc with pt today   Time 20-29 minutes  Provider: Dr. French Ana [name]-Internal Medicine

## 2019-08-06 NOTE — Patient Instructions (Signed)
Stop valsartan/diovan  Goal Blood pressure <130/<80  Too low is <90/<60   Heart rate too low is <60   Let me know how your blood pressure is doing next week call the clinic   Drink no more than 64 ounces daily of fluid     Hypotension As your heart beats, it forces blood through your body. Hypotension, commonly called low blood pressure, is when the force of blood pumping through your arteries is too weak. Arteries are blood vessels that carry blood from the heart throughout the body. Depending on the cause and severity, hypotension may be harmless (benign) or may cause serious problems (be critical). When blood pressure is too low, you may not get enough blood to your brain or to the rest of your organs. This can cause weakness, light-headedness, rapid heartbeat, and fainting. What are the causes? This condition may be caused by:  Blood loss.  Loss of body fluids (dehydration).  Heart problems.  Hormone (endocrine) problems.  Pregnancy.  Severe infection.  Lack of certain nutrients.  Severe allergic reactions (anaphylaxis).  Certain medicines, such as blood pressure medicine or medicines that make the body lose excess fluids (diuretics). Sometimes, hypotension may be caused by not taking medicine as directed, such as taking too much of a certain medicine. What increases the risk? The following factors may make you more likely to develop this condition:  Age. Risk increases as you get older.  Conditions that affect the heart or the central nervous system.  Taking certain medicines, such as blood pressure medicine or diuretics.  Being pregnant. What are the signs or symptoms? Common symptoms of this condition include:  Weakness.  Light-headedness.  Dizziness.  Blurred vision.  Fatigue.  Rapid heartbeat.  Fainting, in severe cases. How is this diagnosed? This condition is diagnosed based on:  Your medical history.  Your symptoms.  Your blood pressure  measurement. Your health care provider will check your blood pressure when you are: ? Lying down. ? Sitting. ? Standing. A blood pressure reading is recorded as two numbers, such as "120 over 80" (or 120/80). The first ("top") number is called the systolic pressure. It is a measure of the pressure in your arteries as your heart beats. The second ("bottom") number is called the diastolic pressure. It is a measure of the pressure in your arteries when your heart relaxes between beats. Blood pressure is measured in a unit called mm Hg. Healthy blood pressure for most adults is 120/80. If your blood pressure is below 90/60, you may be diagnosed with hypotension. Other information or tests that may be used to diagnose hypotension include:  Your other vital signs, such as your heart rate and temperature.  Blood tests.  Tilt table test. For this test, you will be safely secured to a table that moves you from a lying position to an upright position. Your heart rhythm and blood pressure will be monitored during the test. How is this treated? Treatment for this condition may include:  Changing your diet. This may involve eating more salt (sodium) or drinking more water.  Taking medicines to raise your blood pressure.  Changing the dosage of certain medicines you are taking that might be lowering your blood pressure.  Wearing compression stockings. These stockings help to prevent blood clots and reduce swelling in your legs. In some cases, you may need to go to the hospital for:  Fluid replacement. This means you will receive fluids through an IV.  Blood replacement. This means  you will receive donated blood through an IV (transfusion).  Treating an infection or heart problems, if this applies.  Monitoring. You may need to be monitored while medicines that you are taking wear off. Follow these instructions at home: Eating and drinking   Drink enough fluid to keep your urine pale yellow.   Eat a healthy diet, and follow instructions from your health care provider about eating or drinking restrictions. A healthy diet includes: ? Fresh fruits and vegetables. ? Whole grains. ? Lean meats. ? Low-fat dairy products.  Eat extra salt only as directed. Do not add extra salt to your diet unless your health care provider told you to do that.  Eat frequent, small meals.  Avoid standing up suddenly after eating. Medicines  Take over-the-counter and prescription medicines only as told by your health care provider. ? Follow instructions from your health care provider about changing the dosage of your current medicines, if this applies. ? Do not stop or adjust any of your medicines on your own. General instructions   Wear compression stockings as told by your health care provider.  Get up slowly from lying down or sitting positions. This gives your blood pressure a chance to adjust.  Avoid hot showers and excessive heat as directed by your health care provider.  Return to your normal activities as told by your health care provider. Ask your health care provider what activities are safe for you.  Do not use any products that contain nicotine or tobacco, such as cigarettes, e-cigarettes, and chewing tobacco. If you need help quitting, ask your health care provider.  Keep all follow-up visits as told by your health care provider. This is important. Contact a health care provider if you:  Vomit.  Have diarrhea.  Have a fever for more than 2-3 days.  Feel more thirsty than usual.  Feel weak and tired. Get help right away if you:  Have chest pain.  Have a fast or irregular heartbeat.  Develop numbness in any part of your body.  Cannot move your arms or your legs.  Have trouble speaking.  Become sweaty or feel light-headed.  Faint.  Feel short of breath.  Have trouble staying awake.  Feel confused. Summary  Hypotension is when the force of blood pumping  through your arteries is too weak.  Hypotension may be harmless (benign) or may cause serious problems (be critical).  Treatment for this condition may include changing your diet, changing your medicines, and wearing compression stockings.  In some cases, you may need to go to the hospital for fluid or blood replacement. This information is not intended to replace advice given to you by your health care provider. Make sure you discuss any questions you have with your health care provider. Document Revised: 12/18/2017 Document Reviewed: 12/18/2017 Elsevier Patient Education  2020 [employer]  Chronic Kidney Disease, Adult Chronic kidney disease (CKD) happens when the kidneys are damaged over a long period of time. The kidneys are two organs that help with:  Getting rid of waste and extra fluid from the blood.  Making hormones that maintain the amount of fluid in your tissues and blood vessels.  Making sure that the body has the right amount of fluids and chemicals. Most of the time, CKD does not go away, but it can usually be controlled. Steps must be taken to slow down the kidney damage or to stop it from getting worse. If this is not done, the kidneys may stop working. Follow these  instructions at home: Medicines  Take over-the-counter and prescription medicines only as told by your doctor. You may need to change the amount of medicines you take.  Do not take any new medicines unless your doctor says it is okay. Many medicines can make your kidney damage worse.  Do not take any vitamin and supplements unless your doctor says it is okay. Many vitamins and supplements can make your kidney damage worse. General instructions  Follow a diet as told by your doctor. You may need to stay away from: ? Alcohol. ? Salty foods. ? Foods that are high in:  Potassium.  Calcium.  Protein.  Do not use any products that contain nicotine or tobacco, such as cigarettes and e-cigarettes. If  you need help quitting, ask your doctor.  Keep track of your blood pressure at home. Tell your doctor about any changes.  If you have diabetes, keep track of your blood sugar as told by your doctor.  Try to stay at a healthy weight. If you need help, ask your doctor.  Exercise at least 30 minutes a day, 5 days a week.  Stay up-to-date with your shots (immunizations) as told by your doctor.  Keep all follow-up visits as told by your doctor. This is important. Contact a doctor if:  Your symptoms get worse.  You have new symptoms. Get help right away if:  You have symptoms of end-stage kidney disease. These may include: ? Headaches. ? Numbness in your hands or feet. ? Easy bruising. ? Having hiccups often. ? Chest pain. ? Shortness of breath. ? Stopping of menstrual periods in women.  You have a fever.  You have very little pee (urine).  You have pain or bleeding when you pee. Summary  Chronic kidney disease (CKD) happens when the kidneys are damaged over a long period of time.  Most of the time, this condition does not go away, but it can usually be controlled. Steps must be taken to slow down the kidney damage or to stop it from getting worse.  Treatment may include a combination of medicines and lifestyle changes. This information is not intended to replace advice given to you by your health care provider. Make sure you discuss any questions you have with your health care provider. Document Revised: 06/06/2017 Document Reviewed: 07/29/2016 Elsevier Patient Education  2020 [employer] Inc.  Carvedilol Tablets What is this medicine? CARVEDILOL (KAR ve dil ol) is a beta blocker. It decreases the amount of work your heart has to do and helps your heart beat regularly. It treats high blood pressure. This medicine may be used for other purposes; ask your health care provider or pharmacist if you have questions. COMMON BRAND NAME(S): Coreg What should I tell my health care  provider before I take this medicine? They need to know if you have any of these conditions:  circulation problems  diabetes  history of heart attack or heart disease  liver disease  lung or breathing disease, like asthma or emphysema  pheochromocytoma  slow or irregular heartbeat  thyroid disease  an unusual or allergic reaction to carvedilol, other beta-blockers, medicines, foods, dyes, or preservatives  pregnant or trying to get pregnant  breast-feeding How should I use this medicine? Take this drug by mouth. Take it as directed on the prescription label at the same time every day. Take it with food. Keep taking it unless your health care provider tells you to stop. Talk to your health care provider about the use of this  drug in children. Special care may be needed. Overdosage: If you think you have taken too much of this medicine contact a poison control center or emergency room at once. NOTE: This medicine is only for you. Do not share this medicine with others. What if I miss a dose? If you miss a dose, take it as soon as you can. If it is almost time for your next dose, take only that dose. Do not take double or extra doses. What may interact with this medicine? This medicine may interact with the following medications:  certain medicines for blood pressure, heart disease, irregular heart beat  certain medicines for depression, like fluoxetine or paroxetine  certain medicines for diabetes, like glipizide or glyburide  cimetidine  clonidine  cyclosporine  digoxin  MAOIs like Carbex, Eldepryl, Marplan, Nardil, and Parnate  reserpine  rifampin This list may not describe all possible interactions. Give your health care provider a list of all the medicines, herbs, non-prescription drugs, or dietary supplements you use. Also tell them if you smoke, drink alcohol, or use illegal drugs. Some items may interact with your medicine. What should I watch for while  using this medicine? Check your heart rate and blood pressure regularly while you are taking this medicine. Ask your doctor or health care professional what your heart rate and blood pressure should be, and when you should contact him or her. Do not stop taking this medicine suddenly. This could lead to serious heart-related effects. Contact your doctor or health care professional if you have difficulty breathing while taking this drug. Check your weight daily. Ask your doctor or health care professional when you should notify him/her of any weight gain. You may get drowsy or dizzy. Do not drive, use machinery, or do anything that requires mental alertness until you know how this medicine affects you. To reduce the risk of dizzy or fainting spells, do not sit or stand up quickly. Alcohol can make you more drowsy, and increase flushing and rapid heartbeats. Avoid alcoholic drinks. This medicine may increase blood sugar. Ask your healthcare provider if changes in diet or medicines are needed if you have diabetes. If you are going to have surgery, tell your doctor or health care professional that you are taking this medicine. What side effects may I notice from receiving this medicine? Side effects that you should report to your doctor or health care professional as soon as possible:  allergic reactions like skin rash, itching or hives, swelling of the face, lips, or tongue  breathing problems  dark urine  irregular heartbeat   signs and symptoms of high blood sugar such as being more thirsty or hungry or having to urinate more than normal. You may also feel very tired or have blurry vision.  swollen legs or ankles  vomiting  yellowing of the eyes or skin Side effects that usually do not require medical attention (report to your doctor or health care professional if they continue or are bothersome):  change in sex drive or performance  diarrhea  dry eyes (especially if wearing contact  lenses)  dry, itching skin  headache  nausea  unusually tired This list may not describe all possible side effects. Call your doctor for medical advice about side effects. You may report side effects to FDA at 1-800-FDA-1088. Where should I keep my medicine? Keep out of the reach of children and pets. Store at room temperature between 20 and 25 degrees C (68 and 77 degrees F). Protect from moisture.  Keep the container tightly closed. Throw away any unused drug after the expiration date. NOTE: This sheet is a summary. It may not cover all possible information. If you have questions about this medicine, talk to your doctor, pharmacist, or health care provider.  2020 Elsevier/Gold Standard (2019-01-29 17:42:09)

## 2019-08-06 NOTE — Addendum Note (Signed)
Addended by: Quentin Ore on: 08/06/2019 01:36 PM   Modules accepted: Orders

## 2019-08-06 NOTE — Telephone Encounter (Signed)
Please change labs so patient can go to [facility]. Thanks

## 2019-08-07 LAB — PRO B NATRIURETIC PEPTIDE: NT-Pro BNP: 70 pg/mL (ref 0–301)

## 2019-08-09 ENCOUNTER — Telehealth: Payer: Self-pay | Admitting: Internal Medicine

## 2019-08-09 ENCOUNTER — Ambulatory Visit
Admission: RE | Admit: 2019-08-09 | Discharge: 2019-08-09 | Disposition: A | Discharge status: Home / Self Care | Payer: Medicare Other | Source: Ambulatory Visit | Attending: Obstetrics and Gynecology | Admitting: Obstetrics and Gynecology

## 2019-08-09 DIAGNOSIS — R252 Cramp and spasm: Secondary | ICD-10-CM | POA: Insufficient documentation

## 2019-08-09 DIAGNOSIS — Z1231 Encounter for screening mammogram for malignant neoplasm of breast: Secondary | ICD-10-CM | POA: Diagnosis not present

## 2019-08-09 LAB — HEPATITIS C ANTIBODY
Hepatitis C Ab: NONREACTIVE
SIGNAL TO CUT-OFF: 0.01 (ref <1.00)

## 2019-08-09 LAB — ANA: Anti Nuclear Antibody (ANA): NEGATIVE

## 2019-08-09 NOTE — Telephone Encounter (Signed)
Is pt agreeable to  Chest Xray Echo at the hospital ?  Renal US    Let me know

## 2019-08-09 NOTE — Telephone Encounter (Signed)
Left message to call office

## 2019-08-09 NOTE — Telephone Encounter (Signed)
I called pt and left a vm to call ofc to sch Return for 3-4 weeks in office, sch fasting lipid and cortisol asap 8 am to 10 am hrs no later asap .

## 2019-08-10 ENCOUNTER — Other Ambulatory Visit: Payer: Self-pay | Admitting: Internal Medicine

## 2019-08-10 ENCOUNTER — Telehealth: Payer: Self-pay | Admitting: Internal Medicine

## 2019-08-10 DIAGNOSIS — E871 Hypo-osmolality and hyponatremia: Secondary | ICD-10-CM

## 2019-08-10 DIAGNOSIS — I959 Hypotension, unspecified: Secondary | ICD-10-CM

## 2019-08-10 DIAGNOSIS — I10 Essential (primary) hypertension: Secondary | ICD-10-CM

## 2019-08-10 DIAGNOSIS — N182 Chronic kidney disease, stage 2 (mild): Secondary | ICD-10-CM

## 2019-08-10 DIAGNOSIS — I998 Other disorder of circulatory system: Secondary | ICD-10-CM

## 2019-08-10 NOTE — Telephone Encounter (Signed)
Pt would like a call back today about lab orders. She has an appt at [facility] on 08/11/19? Please advise.

## 2019-08-10 NOTE — Progress Notes (Signed)
Called Pt with lab results she stated she understood with No questions. Pt stated Yes she will be willing to have CXR, Echo and Kidney ultrasound done.  Also Pt stated she will have her labs done at [facility] in [place] on [address]

## 2019-08-10 NOTE — Telephone Encounter (Signed)
Pt stated Yes she will be willing to have CXR, Echo and Kidney ultrasound done.

## 2019-08-11 DIAGNOSIS — E785 Hyperlipidemia, unspecified: Secondary | ICD-10-CM | POA: Diagnosis not present

## 2019-08-11 DIAGNOSIS — R600 Localized edema: Secondary | ICD-10-CM | POA: Diagnosis not present

## 2019-08-11 DIAGNOSIS — I959 Hypotension, unspecified: Secondary | ICD-10-CM | POA: Diagnosis not present

## 2019-08-12 LAB — LIPID PANEL
Chol/HDL Ratio: 2.1 ratio (ref 0.0–4.4)
Cholesterol, Total: 128 mg/dL (ref 100–199)
HDL: 62 mg/dL (ref >39)
LDL Chol Calc (NIH): 55 mg/dL (ref 0–99)
Triglycerides: 44 mg/dL (ref 0–149)
VLDL Cholesterol Cal: 11 mg/dL (ref 5–40)

## 2019-08-12 LAB — CORTISOL: Cortisol: 14 ug/dL

## 2019-08-12 LAB — PRO B NATRIURETIC PEPTIDE: NT-Pro BNP: 43 pg/mL (ref 0–301)

## 2019-08-13 ENCOUNTER — Encounter: Payer: Self-pay | Admitting: Internal Medicine

## 2019-08-16 ENCOUNTER — Other Ambulatory Visit: Payer: Self-pay | Admitting: Internal Medicine

## 2019-08-16 DIAGNOSIS — M542 Cervicalgia: Secondary | ICD-10-CM

## 2019-08-17 ENCOUNTER — Other Ambulatory Visit: Payer: Self-pay | Admitting: Internal Medicine

## 2019-08-17 DIAGNOSIS — I10 Essential (primary) hypertension: Secondary | ICD-10-CM

## 2019-08-17 MED ORDER — COREG 3.125 MG PO TABS: 3.1250 mg | ORAL_TABLET | Freq: Two times a day (BID) | ORAL | 0 refills | Status: DC

## 2019-08-18 ENCOUNTER — Telehealth: Payer: Self-pay | Admitting: Internal Medicine

## 2019-08-18 DIAGNOSIS — E1165 Type 2 diabetes mellitus with hyperglycemia: Secondary | ICD-10-CM | POA: Diagnosis not present

## 2019-08-18 NOTE — Telephone Encounter (Signed)
err

## 2019-08-20 ENCOUNTER — Encounter: Payer: Self-pay | Admitting: Internal Medicine

## 2019-08-24 ENCOUNTER — Ambulatory Visit (INDEPENDENT_AMBULATORY_CARE_PROVIDER_SITE_OTHER): Payer: Medicare Other | Admitting: Internal Medicine

## 2019-08-24 ENCOUNTER — Encounter: Payer: Self-pay | Admitting: Internal Medicine

## 2019-08-24 ENCOUNTER — Other Ambulatory Visit: Payer: Self-pay

## 2019-08-24 ENCOUNTER — Ambulatory Visit (INDEPENDENT_AMBULATORY_CARE_PROVIDER_SITE_OTHER): Payer: Medicare Other

## 2019-08-24 VITALS — BP 138/82 | HR 90 | Temp 96.6°F | Ht 61.0 in | Wt 180.0 lb

## 2019-08-24 DIAGNOSIS — H6993 Unspecified Eustachian tube disorder, bilateral: Secondary | ICD-10-CM | POA: Diagnosis not present

## 2019-08-24 DIAGNOSIS — R269 Unspecified abnormalities of gait and mobility: Secondary | ICD-10-CM | POA: Diagnosis not present

## 2019-08-24 DIAGNOSIS — E538 Deficiency of other specified B group vitamins: Secondary | ICD-10-CM | POA: Diagnosis not present

## 2019-08-24 DIAGNOSIS — R2689 Other abnormalities of gait and mobility: Secondary | ICD-10-CM | POA: Diagnosis not present

## 2019-08-24 DIAGNOSIS — N1831 Chronic kidney disease, stage 3a: Secondary | ICD-10-CM | POA: Diagnosis not present

## 2019-08-24 DIAGNOSIS — M542 Cervicalgia: Secondary | ICD-10-CM | POA: Diagnosis not present

## 2019-08-24 DIAGNOSIS — E519 Thiamine deficiency, unspecified: Secondary | ICD-10-CM

## 2019-08-24 DIAGNOSIS — E119 Type 2 diabetes mellitus without complications: Secondary | ICD-10-CM

## 2019-08-24 DIAGNOSIS — G629 Polyneuropathy, unspecified: Secondary | ICD-10-CM

## 2019-08-24 DIAGNOSIS — E531 Pyridoxine deficiency: Secondary | ICD-10-CM | POA: Diagnosis not present

## 2019-08-24 DIAGNOSIS — N182 Chronic kidney disease, stage 2 (mild): Secondary | ICD-10-CM

## 2019-08-24 DIAGNOSIS — I10 Essential (primary) hypertension: Secondary | ICD-10-CM

## 2019-08-24 DIAGNOSIS — M62838 Other muscle spasm: Secondary | ICD-10-CM

## 2019-08-24 LAB — VITAMIN B12: Vitamin B-12: 763 pg/mL (ref 211–911)

## 2019-08-24 LAB — FOLATE: Folate: >24.1 ng/mL (ref >5.9)

## 2019-08-24 MED ORDER — AMLODIPINE BESYLATE 2.5 MG PO TABS: 2.5000 mg | ORAL_TABLET | Freq: Every day | ORAL | 3 refills | Status: DC

## 2019-08-24 MED ORDER — CYCLOBENZAPRINE HCL 5 MG PO TABS: 5.0000 mg | ORAL_TABLET | Freq: Every evening | ORAL | 2 refills | Status: DC | PRN

## 2019-08-24 MED ORDER — AMLODIPINE BESYLATE 5 MG PO TABS: 5.0000 mg | ORAL_TABLET | Freq: Every day | ORAL | 3 refills | Status: DC

## 2019-08-24 NOTE — Progress Notes (Signed)
Chief Complaint  Patient presents with  . Follow-up  . Neck Pain    7/10 pain score. Onset of last week. No known injury   F/u  1. C/o posterior nack pain qd 7/10 and pain with rom tried icy hot, aspercream heating pad pain rad to b/l shoulders and ROM normal she burned her skin recently with heating pad  2. Fluid in b/l ears saw ENT in the past dx'ed with TMJ ETD and tried flonase and allegra in the past which helped but not currently doing  3. HTN on coreg 3.125 mg bid and BP 138/82 with h/o CKD 2 most of times sometimes 3  She wants more info about CKD  She reports cbgs prior to eating breads running high on coreg 3.125 mg bid 140s-205 and reviewed BB can cause hyperglycemia last A1C 5.6 but can cause hyperglycemia 5-12% 4. DM 2 controlled on metformin and onglyza wants food exam today  5. Off balance saw neurology who wanted B12, B1, B6, vitamin D prev check SPEP/UPEP, folate checked they reviewed her prior MRI pt has not scheduled NCS due to does not want to do this yet    Review of Systems  Constitutional: Negative for weight loss.  HENT: Negative for hearing loss.        +TMJ   Eyes: Negative for blurred vision.  Respiratory: Negative for shortness of breath.   Cardiovascular: Negative for chest pain.  Gastrointestinal: Negative for abdominal pain.  Musculoskeletal: Positive for neck pain.  Psychiatric/Behavioral: Negative for memory loss.   Past Medical History:  Diagnosis Date  . Anemia   . Diabetes mellitus without complication (HCC)   . Glaucoma    [facility] with macular edema and Dr. Frederico Hamman sees Q3-4 months  . Hypertension    Past Surgical History:  Procedure Laterality Date  . BIOPSY THYROID    . CESAREAN SECTION     1981 1988  . TUBAL LIGATION     Family History  Problem Relation Age of Onset  . Diabetes Mother   . Heart failure Mother   . Diabetes Sister   . Healthy Sister   . Healthy Sister   . Healthy Sister   . Diabetes Brother   . Diabetes  Sister   . Heart failure Father   . Brain cancer Brother   . Throat cancer Brother   . Glaucoma Other        Runs on Paternal Side  . Breast cancer Cousin    Social History   Socioeconomic History  . Marital status: Widowed    Spouse name: Not on file  . Number of children: 2  . Years of education: College  . Highest education level: Not on file  Occupational History  . 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 Smoking status: Never Smoker  . Smokeless tobacco: Never Used  Substance and Sexual Activity  . Alcohol use: No  . Drug use: No  . Sexual activity: Not Currently    Birth control/protection: None  Other Topics Concern  . Not on file  Social History Narrative   Lives at home    Social Determinants of Health   Financial Resource Strain: Low Risk   . Difficulty of Paying Living Expenses: Not hard at all  Food Insecurity: No Food Insecurity  . Worried About Programme researcher, broadcasting/film/video in the Last Year: Never true  . Ran Out of Food in the Last Year: Never  true  Transportation Needs: No Transportation Needs  . Lack of Transportation (Medical): No  . Lack of Transportation (Non-Medical): No  Physical Activity: Inactive  . Days of Exercise per Week: 0 days  . Minutes of Exercise per Session: 0 min  Stress: No Stress Concern Present  . Feeling of Stress : Not at all  Social Connections: Unknown  . Frequency of Communication with Friends and Family: Patient refused  . Frequency of Social Gatherings with Friends and Family: Patient refused  . Attends Religious Services: Patient refused  . Active Member of Clubs or Organizations: Patient refused  . Attends Banker Meetings: Patient refused  . Marital Status: Patient refused  Intimate Partner Violence: Unknown  . Fear of Current or Ex-Partner: Patient refused  . Emotionally Abused: Patient refused  . Physically Abused: Patient refused  . Sexually Abused: Patient refused    Current Meds  Medication Sig  . BLACK ELDERBERRY PO Take by mouth daily.  . metFORMIN (GLUCOPHAGE) 1000 MG tablet Take 1 tablet (1,000 mg total) by mouth daily.  . Multiple Vitamin (MULTIVITAMIN WITH MINERALS) TABS tablet Take 1 tablet by mouth daily.  . saxagliptin HCl (ONGLYZA) 2.5 MG TABS tablet Take 1 tablet (2.5 mg total) by mouth daily.  Marland Kitchen triamcinolone cream (KENALOG) 0.1 % Apply 1 application topically 2 (two) times daily. Left lower leg bid prn  . vitamin C (ASCORBIC ACID) 500 MG tablet Take 500 mg by mouth daily.  Marland Kitchen zinc sulfate 220 (50 Zn) MG capsule Take 220 mg by mouth daily.  . [DISCONTINUED] COREG 3.125 MG tablet Take 1 tablet (3.125 mg total) by mouth 2 (two) times daily with a meal. Brand only   Allergies  Allergen Reactions  . Hctz [Hydrochlorothiazide]     Low na 124   . Januvia [Sitagliptin] Diarrhea  . Sulfa Antibiotics    Recent Results (from the past 2160 hour(s))  POCT HgB A1C     Status: None   Collection Time: 06/07/19  8:46 AM  Result Value Ref Range   Hemoglobin A1C 5.5 4.0 - 5.6 %   Est. average glucose Bld gHb Est-mCnc 111   CBC w/Diff/Platelet     Status: Abnormal   Collection Time: 07/12/19  9:54 AM  Result Value Ref Range   WBC 5.2 3.4 - 10.8 x10E3/uL   RBC 3.63 (L) 3.77 - 5.28 x10E6/uL   Hemoglobin 11.2 11.1 - 15.9 g/dL   Hematocrit 12.4 58.0 - 46.6 %   MCV 94 79 - 97 fL   MCH 30.9 26.6 - 33.0 pg   MCHC 32.9 31.5 - 35.7 g/dL   RDW 99.8 33.8 - 25.0 %   Platelets 255 150 - 450 x10E3/uL   Neutrophils 62 Not Estab. %   Lymphs 28 Not Estab. %   Monocytes 7 Not Estab. %   Eos 3 Not Estab. %   Basos 0 Not Estab. %   Neutrophils Absolute 3.3 1.4 - 7.0 x10E3/uL   Lymphocytes Absolute 1.4 0.7 - 3.1 x10E3/uL   Monocytes Absolute 0.3 0.1 - 0.9 x10E3/uL   EOS (ABSOLUTE) 0.1 0.0 - 0.4 x10E3/uL   Basophils Absolute 0.0 0.0 - 0.2 x10E3/uL   Immature Granulocytes 0 Not Estab. %   Immature Grans (Abs) 0.0 0.0 - 0.1 x10E3/uL  Comprehensive Metabolic  Panel (CMET)     Status: Abnormal   Collection Time: 07/12/19  9:54 AM  Result Value Ref Range   Glucose 88 65 - 99 mg/dL   BUN 28 (H) 8 -  27 mg/dL   Creatinine, Ser 8.28 (H) 0.57 - 1.00 mg/dL   GFR calc non Af Amer 55 (L) >59 mL/min/1.73   GFR calc Af Amer 63 >59 mL/min/1.73   BUN/Creatinine Ratio 26 12 - 28   Sodium 141 134 - 144 mmol/L   Potassium 4.4 3.5 - 5.2 mmol/L   Chloride 103 96 - 106 mmol/L   CO2 23 20 - 29 mmol/L   Calcium 9.9 8.7 - 10.3 mg/dL   Total Protein 7.5 6.0 - 8.5 g/dL   Albumin 4.2 3.8 - 4.8 g/dL   Globulin, Total 3.3 1.5 - 4.5 g/dL   Albumin/Globulin Ratio 1.3 1.2 - 2.2   Bilirubin Total 0.2 0.0 - 1.2 mg/dL   Alkaline Phosphatase 108 39 - 117 IU/L   AST 18 0 - 40 IU/L   ALT 14 0 - 32 IU/L  TSH     Status: None   Collection Time: 07/12/19  9:54 AM  Result Value Ref Range   TSH 1.850 0.450 - 4.500 uIU/mL  HgB A1c     Status: None   Collection Time: 07/12/19  9:54 AM  Result Value Ref Range   Hgb A1c MFr Bld 5.6 4.8 - 5.6 %    Comment:          Prediabetes: 5.7 - 6.4          Diabetes: >6.4          Glycemic control for adults with diabetes: <7.0    Est. average glucose Bld gHb Est-mCnc 114 mg/dL  Hepatitis C antibody     Status: None   Collection Time: 08/06/19  1:43 PM  Result Value Ref Range   Hepatitis C Ab NON-REACTIVE NON-REACTI   SIGNAL TO CUT-OFF 0.01 <1.00    Comment: . HCV antibody was non-reactive. There is no laboratory  evidence of HCV infection. . In most cases, no further action is required. However, if recent HCV exposure is suspected, a test for HCV RNA (test code 00349) is suggested. . For additional information please refer to http://education.[facility].com/faq/FAQ22v1 (This link is being provided for informational/ educational purposes only.) .   Pro b natriuretic peptide (BNP)     Status: None   Collection Time: 08/06/19  1:43 PM  Result Value Ref Range   NT-Pro BNP 70 0 - 301 pg/mL    Comment: The following  cut-points have been suggested for the use of proBNP for the diagnostic evaluation of heart failure (HF) in patients with acute dyspnea: Modality                     Age           Optimal Cut                            (years)            Point ------------------------------------------------------ Diagnosis (rule in HF)        <50            450 pg/mL                           50 - 75            900 pg/mL                               >  75           1800 pg/mL Exclusion (rule out HF)  Age independent     300 pg/mL   Antinuclear Antib (ANA)     Status: None   Collection Time: 08/06/19  1:43 PM  Result Value Ref Range   Anti Nuclear Antibody (ANA) NEGATIVE NEGATIVE    Comment: ANA IFA is a first line screen for detecting the presence of up to approximately 150 autoantibodies in various autoimmune diseases. A negative ANA IFA result suggests an ANA-associated autoimmune disease is not present at this time, but is not definitive. If there is high clinical suspicion for Sjogren's syndrome, testing for anti-SS-A/Ro antibody should be considered. Anti-Jo-1 antibody should be considered for clinically suspected inflammatory myopathies. . AC-0: Negative . International Consensus on ANA Patterns ([email or web address]) . For additional information, please refer to http://education.[facility].com/faq/FAQ177 (This link is being provided for informational/ educational purposes only.) .   Basic Metabolic Panel (BMET)     Status: Abnormal   Collection Time: 08/06/19  1:43 PM  Result Value Ref Range   Sodium 137 135 - 145 mEq/L   Potassium 4.2 3.5 - 5.1 mEq/L   Chloride 102 96 - 112 mEq/L   CO2 30 19 - 32 mEq/L   Glucose, Bld 90 70 - 99 mg/dL   BUN 29 (H) 6 - 23 mg/dL   Creatinine, Ser 4.09 0.40 - 1.20 mg/dL   GFR 81.19 >14.78 mL/min   Calcium 10.5 8.4 - 10.5 mg/dL  D-Dimer, Quantitative     Status: None   Collection Time: 08/06/19  1:43 PM  Result Value Ref  Range   D-Dimer, Quant 0.33 <0.50 mcg/mL FEU    Comment: . The D-Dimer test is used frequently to exclude an acute PE or DVT. In patients with a low to moderate clinical risk assessment and a D-Dimer result <0.50 mcg/mL FEU, the likelihood of a PE or DVT is very low. However, a thromboembolic event should not be excluded solely on the basis of the D-Dimer level. Increased levels of D-Dimer are associated with a PE, DVT, DIC, malignancies, inflammation, sepsis, surgery, trauma, pregnancy, and advancing patient age. [Jama 2006 11:295(2):199-207] . For additional information, please refer to: http://education.[facility].com/faq/FAQ149 (This link is being provided for informational/ educational purposes only) .   Cortisol     Status: None   Collection Time: 08/11/19  8:34 AM  Result Value Ref Range   Cortisol 14.0 ug/dL    Comment:                         Cortisol AM         6.2 - 19.4                         Cortisol PM         2.3 - 11.9   Lipid panel     Status: None   Collection Time: 08/11/19  8:34 AM  Result Value Ref Range   Cholesterol, Total 128 100 - 199 mg/dL   Triglycerides 44 0 - 149 mg/dL   HDL 62 >29 mg/dL   VLDL Cholesterol Cal 11 5 - 40 mg/dL   LDL Chol Calc (NIH) 55 0 - 99 mg/dL   Chol/HDL Ratio 2.1 0.0 - 4.4 ratio    Comment:  T. Chol/HDL Ratio                                             Men  Women                               1/2 Avg.Risk  3.4    3.3                                   Avg.Risk  5.0    4.4                                2X Avg.Risk  9.6    7.1                                3X Avg.Risk 23.4   11.0   Pro b natriuretic peptide (BNP)     Status: None   Collection Time: 08/11/19  8:35 AM  Result Value Ref Range   NT-Pro BNP 43 0 - 301 pg/mL    Comment: The following cut-points have been suggested for the use of proBNP for the diagnostic evaluation of heart failure (HF) in patients with acute dyspnea:  Modality                     Age           Optimal Cut                            (years)            Point ------------------------------------------------------ Diagnosis (rule in HF)        <50            450 pg/mL                           50 - 75            900 pg/mL                               >75           1800 pg/mL Exclusion (rule out HF)  Age independent     300 pg/mL    Objective  Body mass index is 34.01 kg/m. Wt Readings from Last 3 Encounters:  08/24/19 180 lb (81.6 kg)  08/06/19 186 lb 9.6 oz (84.6 kg)  07/22/19 188 lb (85.3 kg)   Temp Readings from Last 3 Encounters:  08/24/19 (!) 96.6 F (35.9 C) (Temporal)  08/06/19 98.1 F (36.7 C) (Skin)  07/12/19 (!) 96.9 F (36.1 C) (Temporal)   BP Readings from Last 3 Encounters:  08/24/19 138/82  08/06/19 (!) 150/90  07/12/19 (!) 143/77   Pulse Readings from Last 3 Encounters:  08/24/19 90  08/06/19 98  07/12/19 87    Physical Exam Vitals and nursing note reviewed.  Constitutional:      Appearance: Normal appearance.  HENT:     Head: Normocephalic and  atraumatic.     Ears:     Comments: Mild fluid right ear  Eyes:     Conjunctiva/sclera: Conjunctivae normal.     Pupils: Pupils are equal, round, and reactive to light.  Neck:   Cardiovascular:     Rate and Rhythm: Normal rate and regular rhythm.     Heart sounds: Normal heart sounds. No murmur.  Pulmonary:     Effort: Pulmonary effort is normal.     Breath sounds: Normal breath sounds.  Neurological:     Mental Status: She is alert.     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 - Plan: Protein Electrophoresis, (serum), Protein Electro, Random Urine Control DM and HTN  Neuropathy - Plan: Vitamin B12,  Folate, Vitamin B1, Vitamin B6, Protein Electrophoresis, (serum), Protein Electro, Random Urine  Essential hypertension - Plan: amLODipine (NORVASC) 5 MG tablet, amLODipine (NORVASC) 2.5 MG tablet add if BP >130/>80 Stop coreg 3.125 mg bid due to c/w hyperglycmeia  Disorder of both eustachian tubes rec flonase, allegra   Controlled type 2 diabetes mellitus without complication, without long-term current use of insulin (HCC)  Cont meds  DM foot exam today  Consider eye exam in future   HM Flu shot declines  prevnar utd  pna 23 consider in future  Tdap utd  Consider shingrix vaccines in future  covid 19 has not had vaccine yet   Pap 05/13/19 negative neg HPV  mammo sch 08/09/19 neg  Colonoscopy had in 3/252013 per pt she was ~61 per chart review was normal though I am unable to review report pt states she had at [facility] -need to get copy of report  dexa consider in future will try to get pt scheduled  Never smoker but exposed to 2nd hand via husband Consider renal US duplex and echo in the future given above disc with pt prev   Provider: Dr. Olivia Mackie [name]-Internal Medicine

## 2019-08-24 NOTE — Patient Instructions (Addendum)
Results for Natasha Phillips, Natasha Phillips (MRN [id number]) as of 08/24/2019 11:52  Ref. Range 09/06/2016 12:31 10/07/2017 10:39 02/02/2019 09:50  Vitamin D, 25-Hydroxy Latest Ref Range: 30.0 - 100.0 ng/mL 39.9 39.0 41.2     Preventing Chronic Kidney Disease Chronic kidney disease (CKD) occurs when the kidneys are damaged for at least 3 months and do not function effectively. The kidneys are two organs that do many important jobs in the body, including:  Removing waste and extra fluid from the blood to make urine.  Making hormones that maintain the amount of fluid in tissues and blood vessels.  Maintaining the right amount of fluids and electrolytes in the body. A small amount of kidney damage may not cause problems, but a large amount of damage may make it hard or impossible for the kidneys to work the way they should. CKD gets worse over time (is progressive). You can take steps to prevent CKD or to keep it from getting worse. The best way to prevent kidney damage is to know your risk factors and make changes before you develop symptoms of CKD. How can this condition affect me? At first, you may not notice any signs or symptoms of CKD. Symptoms develop slowly and may not be obvious until the kidney damage becomes severe. If steps are not taken to prevent or slow down the disease, CKD can lead to:  A low red blood cell count (anemia).  Heart disease.  Weak bones.  Nerve damage (neuropathy).  Stroke.  Kidney failure and dialysis. What can increase my risk? You are more likely to develop CKD if you:  Are 31 years of age or older.  Are female.  Are of African American, Native American, Hispanic, Asian, or [place] descent.  Are obese.  Have taken certain medicines for a long time.  Use tobacco or have used it in the past.  Have any of the following conditions: ? Diabetes. ? High blood pressure. ? Heart disease. ? Multiple myeloma. ? An autoimmune disease. ? Frequent urinary tract  infections. ? Polycystic kidney disease.  Have a family history of kidney disease, heart disease, diabetes, or high blood pressure.  Have problems with urine flow that may be caused by: ? Cancer. ? Having kidney stones more than once. ? An enlarged prostate in males. What actions can I take to prevent CKD? Managing conditions that put you at risk  Talk to your health care provider about your kidney health and your risk factors for CKD.  Work with your health care provider to manage conditions such as high blood pressure or diabetes. This may involve taking medicines, eating healthy, or making lifestyle changes to: ? Get high blood pressure down to the target that your health care provider recommends. ? Get blood sugar (glucose) levels down to the target that your health care provider recommends. Eating and drinking   Follow instructions from your health care provider about diet. This may include: ? Limiting salt (sodium) intake. You should have less than 1 tsp (2,300 mg) of sodium a day. If you have heart disease or high blood pressure, you should have less than  tsp (1,500 mg) of sodium a day. ? Limiting protein intake as told by your health care provider. Avoid high-protein foods. ? Eating a balanced, heart-healthy diet. ? Avoiding foods that are high in potassium and phosphorous.  Limit alcohol. If you drink alcohol: ? Limit how much you use to:  0-1 drink a day for nonpregnant women.  0-2 drinks a  day for men. ? Be aware of how much alcohol is in your drink. In the [place] one drink equals one 12 oz bottle of beer (355 mL), one 5 oz glass of wine (148 mL), or one 1 oz glass of hard liquor (44 mL).  If you have diabetes, work with a Financial planner (Firefighter) or a certified diabetes educator to develop a healthy eating plan.  Talk with your health care provider about how much fluid you should drink each day. Lifestyle   Exercise for at least 30 minutes on 5  or more days of the week, or as much as told by your health care provider.  Keep your weight at a healthy level. If you are overweight or obese, lose weight as told by your health care provider.  Do not use any products that contain nicotine or tobacco, such as cigarettes, e-cigarettes, and chewing tobacco. If you need help quitting, ask your health care provider. General instructions  Take over-the-counter and prescription medicines only as told by your health care provider. Do not take any new medicines unless approved by your health care provider.  Use NSAIDs for pain only when necessary. Ask your health care provider about other pain medicines that do not increase your risk of developing CKD.  Have a yearly physical exam.  Learn about your family's medical history. Talk to your relatives and siblings about diabetes, heart disease, and high blood pressure. Where to find more information Learn more about CKD and how to prevent CKD from:  [place]: www.kidney.org  American Association of Kidney Patients: BombTimer.gl  American Diabetes Association: www.diabetes.org Summary  Symptoms of CKD develop slowly and may not be obvious until the kidney damage becomes severe.  The best way to prevent kidney damage is to know your risk factors and make nutrition and lifestyle changes before you develop symptoms of CKD.  Follow instructions from your health care provider about diet, which may include limiting how much salt, protein, and alcohol you consume.  Work with your health care provider to keep your blood pressure and blood sugar levels within the recommended range. This information is not intended to replace advice given to you by your health care provider. Make sure you discuss any questions you have with your health care provider. Document Revised: 10/16/2018 Document Reviewed: 07/29/2018 Elsevier Patient Education  2020 [place] for Chronic  Kidney Disease When your kidneys are not working well, they cannot remove waste and excess substances from your blood as effectively as they did before. This can lead to a buildup and imbalance of these substances, which can worsen kidney damage and affect how your body functions. Certain foods lead to a buildup of these substances in the body. By changing your diet as recommended by your diet and nutrition specialist (dietitian) or health care provider, you could help prevent further kidney damage and delay or prevent the need for dialysis. What are tips for following this plan? General instructions   Work with your health care provider and dietitian to develop a meal plan that is right for you. Foods you can eat, limit, or avoid will be different for each person depending on the stage of kidney disease and any other existing health conditions.  Talk with your health care provider about whether you should take a vitamin and mineral supplement.  Use standard measuring cups and spoons to measure servings of foods. Use a kitchen scale to measure portions of protein foods.  If directed  by your health care provider, avoid drinking too much fluid. Measure and count all liquids, including water, ice, soups, flavored gelatin, and frozen desserts such as popsicles or ice cream. Reading food labels  Check the amount of sodium in foods. Choose foods that have less than 300 milligrams (mg) per serving.  Check the ingredient list for phosphorus or potassium-based additives or preservatives.  Check the amount of saturated and trans fat. Limit or avoid these fats as told by your dietitian. Shopping  Avoid buying foods that are: ? Processed, frozen, or prepackaged. ? Calcium-enriched or fortified.  Do not buy foods that have salt or sodium listed among the first five ingredients.  Do not buy canned vegetables. Cooking  Replace animal proteins, such as meat, fish, eggs, or dairy, with plant proteins  from beans, nuts, and soy. ? Use soy milk instead of cow's milk. ? Add beans or tofu to soups, casseroles, or pasta dishes instead of meat.  Soak vegetables, such as potatoes, before cooking to reduce potassium. To do this: ? Peel and cut into small pieces. ? Soak in warm water for at least 2 hours. For every 1 cup of vegetables, use 10 cups of water. ? Drain and rinse with warm water. ? Boil for at least 5 minutes. Meal planning  Limit the amount of protein from plant and animal sources you eat each day.  Do not add salt to food when cooking or before eating.  Eat meals and snacks at around the same time each day. If you have diabetes:  If you have diabetes (diabetes mellitus) and chronic kidney disease, it is important to keep your blood glucose in the target range recommended by your health care provider. Follow your diabetes management plan. This may include: ? Checking your blood glucose regularly. ? Taking oral medicines, insulin, or both. ? Exercising for at least 30 minutes on 5 or more days each week, or as told by your health care provider. ? Tracking how many servings of carbohydrates you eat at each meal.  You may be given specific guidelines on how much of certain foods and nutrients you may eat, depending on your stage of kidney disease and whether you have high blood pressure (hypertension). Follow your meal plan as told by your dietitian. What nutrients should be limited? The items listed are not a complete list. Talk with your dietitian about what dietary choices are best for you. Potassium Potassium affects how steadily your heart beats. If too much potassium builds up in your blood, it can cause an irregular heartbeat or even a heart attack. You may need to eat less potassium, depending on your blood potassium levels and the stage of kidney disease. Talk to your dietitian about how much potassium you may have each day. You may need to limit or avoid foods that are  high in potassium, such as:  Milk and soy milk.  Fruits, such as bananas, papaya, apricots, nectarines, melon, prunes, raisins, kiwi, and oranges.  Vegetables, such as potatoes, sweet potatoes, yams, tomatoes, leafy greens, beets, okra, avocado, pumpkin, and winter squash.  White and lima beans. Phosphorus Phosphorus is a mineral found in your bones. A balance between calcium and phosphorous is needed to build and maintain healthy bones. Too much phosphorus pulls calcium from your bones. This can make your bones weak and more likely to break. Too much phosphorus can also make your skin itch. You may need to eat less phosphorus depending on your blood phosphorus levels and the  stage of kidney disease. Talk to your dietitian about how much potassium you may have each day. You may need to take medicine to lower your blood phosphorus levels if diet changes do not help. You may need to limit or avoid foods that are high in phosphorus, such as:  Milk and dairy products.  Dried beans and peas.  Tofu, soy milk, and other soy-based meat replacements.  Colas.  Nuts and peanut butter.  Meat, poultry, and fish.  Bran cereals and oatmeals. Protein Protein helps you to make and keep muscle. It also helps in the repair of your body's cells and tissues. One of the natural breakdown products of protein is a waste product called urea. When your kidneys are not working properly, they cannot remove wastes, such as urea, like they did before you developed chronic kidney disease. Reducing how much protein you eat can help prevent a buildup of urea in your blood. Depending on your stage of kidney disease, you may need to limit foods that are high in protein. Sources of animal protein include:  Meat (all types).  Fish and seafood.  Poultry.  Eggs.  Dairy. Other protein foods include:  Beans and legumes.  Nuts and nut butter.  Soy and tofu. Sodium Sodium, which is found in salt, helps maintain  a healthy balance of fluids in your body. Too much sodium can increase your blood pressure and have a negative effect on the function of your heart and lungs. Too much sodium can also cause your body to retain too much fluid, making your kidneys work harder. Most people should have less than 2,300 milligrams (mg) of sodium each day. If you have hypertension, you may need to limit your sodium to 1,500 mg each day. Talk to your dietitian about how much sodium you may have each day. You may need to limit or avoid foods that are high in sodium, such as:  Salt seasonings.  Soy sauce.  Cured and processed meats.  Salted crackers and snack foods.  Fast food.  Canned soups and most canned foods.  Pickled foods.  Vegetable juice.  Boxed mixes or ready-to-eat boxed meals and side dishes.  Bottled dressings, sauces, and marinades. Summary  Chronic kidney disease can lead to a buildup and imbalance of waste and excess substances in the body. Certain foods lead to a buildup of these substances. By adjusting your intake of these foods, you could help prevent more kidney damage and delay or prevent the need for dialysis.  Food adjustments are different for each person with chronic kidney disease. Work with a dietitian to set up nutrient goals and a meal plan that is right for you.  If you have diabetes and chronic kidney disease, it is important to keep your blood glucose in the target range recommended by your health care provider. This information is not intended to replace advice given to you by your health care provider. Make sure you discuss any questions you have with your health care provider. Document Revised: 10/15/2018 Document Reviewed: 06/19/2016 Elsevier Patient Education  [place].  Chronic Kidney Disease, Adult Chronic kidney disease (CKD) occurs when the kidneys become damaged slowly over a long period of time. The kidneys are a pair of organs that do many important jobs  in the body, including:  Removing waste and extra fluid from the blood to make urine.  Making hormones that maintain the amount of fluid in tissues and blood vessels.  Maintaining the right amount of fluids and  chemicals in the body. A small amount of kidney damage may not cause problems, but a large amount of damage may make it hard or impossible for the kidneys to work the way they should. If steps are not taken to slow down kidney damage or to stop it from getting worse, the kidneys may stop working permanently (end-stage renal disease or ESRD). Most of the time, CKD does not go away, but it can often be controlled. People who have CKD are usually able to live normal lives. What are the causes? The most common causes of this condition are diabetes and high blood pressure (hypertension). Other causes include:  Heart and blood vessel (cardiovascular) disease.  Kidney diseases, such as: ? Glomerulonephritis. ? Interstitial nephritis. ? Polycystic kidney disease. ? Renal vascular disease.  Diseases that affect the immune system.  Genetic diseases.  Medicines that damage the kidneys, such as anti-inflammatory medicines.  Being around or being in contact with poisonous (toxic) substances.  A kidney or urinary infection that occurs again and again (recurs).  Vasculitis. This is swelling or inflammation of the blood vessels.  A problem with urine flow that may be caused by: ? Cancer. ? Having kidney stones more than one time. ? An enlarged prostate, in males. What increases the risk? You are more likely to develop this condition if you:  Are older than age 18.  Are female.  Are African-American, Hispanic, Asian, [place], or American [place].  Are a current or former smoker.  Are obese.  Have a family history of kidney disease or failure.  Often take medicines that are damaging to the kidneys. What are the signs or symptoms? Symptoms of this condition  include:  Swelling (edema) of the face, legs, ankles, or feet.  Tiredness (lethargy) and having less energy.  Nausea or vomiting.  Confusion or trouble concentrating.  Problems with urination, such as: ? Painful or burning feeling during urination. ? Decreased urine production. ? Frequent urination, especially at night. ? Bloody urine.  Muscle twitches and cramps, especially in the legs.  Shortness of breath.  Weakness.  Loss of appetite.  Metallic taste in the mouth.  Trouble sleeping.  Dry, itchy skin.  A low blood count (anemia).  Pale lining of the eyelids and surface of the eye (conjunctiva). Symptoms develop slowly and may not be obvious until the kidney damage becomes severe. It is possible to have kidney disease for years without having any symptoms. How is this diagnosed? This condition may be diagnosed based on:  Blood tests.  Urine tests.  Imaging tests, such as an ultrasound or CT scan.  A test in which a sample of tissue is removed from the kidneys to be examined under a microscope (kidney biopsy). These test results will help your health care provider determine how serious the CKD is. How is this treated? There is no cure for most cases of this condition, but treatment usually relieves symptoms and prevents or slows the progression of the disease. Treatment may include:  Making diet changes, which may require you to avoid alcohol, salty foods (sodium), and foods that are high in potassium, calcium, and protein.  Medicines: ? To lower blood pressure. ? To control blood glucose. ? To relieve anemia. ? To relieve swelling. ? To protect your bones. ? To improve the balance of electrolytes in your blood.  Removing toxic waste from the body through types of dialysis, if the kidneys can no longer do their job (kidney failure).  Managing any other  conditions that are causing your CKD or making it worse. Follow these instructions at  home: Medicines  Take over-the-counter and prescription medicines only as told by your health care provider. The dose of some medicines that you take may need to be adjusted.  Do not take any new medicines unless approved by your health care provider. Many medicines can worsen your kidney damage.  Do not take any vitamin and mineral supplements unless approved by your health care provider. Many nutritional supplements can worsen your kidney damage. General instructions  Follow your prescribed diet as told by your health care provider.  Do not use any products that contain nicotine or tobacco, such as cigarettes and e-cigarettes. If you need help quitting, ask your health care provider.  Monitor and track your blood pressure at home. Report changes in your blood pressure as told by your health care provider.  If you are being treated for diabetes, monitor and track your blood sugar (blood glucose) levels as told by your health care provider.  Maintain a healthy weight. If you need help with this, ask your health care provider.  Start or continue an exercise plan. Exercise at least 30 minutes a day, 5 days a week.  Keep your immunizations up to date as told by your health care provider.  Keep all follow-up visits as told by your health care provider. This is important. Where to find more information  American Association of Kidney Patients: BombTimer.gl  National Kidney Foundation: www.kidney.[place]: https://mathis.com/  [facility]: www.lifeoptions.org and www.kidneyschool.org Contact a health care provider if:  Your symptoms get worse.  You develop new symptoms. Get help right away if:  You develop symptoms of ESRD, which include: ? Headaches. ? Numbness in the hands or feet. ? Easy bruising. ? Frequent hiccups. ? Chest pain. ? Shortness of breath. ? Lack of menstruation, in women.  You have a fever.  You have decreased urine  production.  You have pain or bleeding when you urinate. Summary  Chronic kidney disease (CKD) occurs when the kidneys become damaged slowly over a long period of time.  The most common causes of this condition are diabetes and high blood pressure (hypertension).  There is no cure for most cases of this condition, but treatment usually relieves symptoms and prevents or slows the progression of the disease. Treatment may include a combination of medicines and lifestyle changes. This information is not intended to replace advice given to you by your health care provider. Make sure you discuss any questions you have with your health care provider. Document Revised: 06/06/2017 Document Reviewed: 08/01/2016 Elsevier Patient Education  2020 [employer].

## 2019-08-25 ENCOUNTER — Ambulatory Visit: Payer: Medicare Other | Admitting: Internal Medicine

## 2019-08-26 LAB — PROTEIN ELECTRO, RANDOM URINE
Albumin ELP, Urine: 100 %
Alpha-1-Globulin, U: 0 %
Alpha-2-Globulin, U: 0 %
Beta Globulin, U: 0 %
Gamma Globulin, U: 0 %
Protein, Ur: 7.1 mg/dL

## 2019-08-27 LAB — PROTEIN ELECTROPHORESIS, SERUM
A/G Ratio: 1.1 (ref 0.7–1.7)
Albumin ELP: 4.4 g/dL (ref 2.9–4.4)
Alpha 1: 0.2 g/dL (ref 0.0–0.4)
Alpha 2: 0.9 g/dL (ref 0.4–1.0)
Beta: 1.1 g/dL (ref 0.7–1.3)
Gamma Globulin: 1.7 g/dL (ref 0.4–1.8)
Globulin, Total: 3.9 g/dL (ref 2.2–3.9)
Total Protein: 8.3 g/dL (ref 6.0–8.5)

## 2019-08-30 LAB — VITAMIN B1: Vitamin B1 (Thiamine): 12 nmol/L (ref 8–30)

## 2019-08-30 LAB — VITAMIN B6: Vitamin B6: 30.5 ng/mL — ABNORMAL HIGH (ref 2.1–21.7)

## 2019-09-01 ENCOUNTER — Telehealth: Payer: Self-pay | Admitting: Internal Medicine

## 2019-09-01 NOTE — Telephone Encounter (Signed)
Prior authorization has been submitted for patient's cyclobenzaprine (FLEXERIL) 5 MG tablet   Awaiting approval or denial.

## 2019-09-06 ENCOUNTER — Ambulatory Visit: Payer: Medicare Other | Admitting: Family Medicine

## 2019-09-06 NOTE — Telephone Encounter (Signed)
Medication has been approved. MyChart message sent to patient a long with a call.

## 2019-09-07 ENCOUNTER — Ambulatory Visit: Payer: Medicare Other | Admitting: Internal Medicine

## 2019-09-07 NOTE — Telephone Encounter (Signed)
Pa approval placed in the scan pile.

## 2019-09-09 ENCOUNTER — Other Ambulatory Visit: Payer: Self-pay

## 2019-09-09 ENCOUNTER — Ambulatory Visit
Admission: RE | Admit: 2019-09-09 | Discharge: 2019-09-09 | Disposition: A | Discharge status: Home / Self Care | Payer: Medicare Other | Source: Ambulatory Visit | Attending: Internal Medicine | Admitting: Internal Medicine

## 2019-09-09 DIAGNOSIS — I998 Other disorder of circulatory system: Secondary | ICD-10-CM

## 2019-09-09 DIAGNOSIS — I959 Hypotension, unspecified: Secondary | ICD-10-CM | POA: Diagnosis not present

## 2019-09-09 DIAGNOSIS — I071 Rheumatic tricuspid insufficiency: Secondary | ICD-10-CM | POA: Insufficient documentation

## 2019-09-09 DIAGNOSIS — I10 Essential (primary) hypertension: Secondary | ICD-10-CM | POA: Diagnosis not present

## 2019-09-09 DIAGNOSIS — I129 Hypertensive chronic kidney disease with stage 1 through stage 4 chronic kidney disease, or unspecified chronic kidney disease: Secondary | ICD-10-CM | POA: Diagnosis not present

## 2019-09-09 DIAGNOSIS — E1122 Type 2 diabetes mellitus with diabetic chronic kidney disease: Secondary | ICD-10-CM | POA: Insufficient documentation

## 2019-09-09 DIAGNOSIS — N182 Chronic kidney disease, stage 2 (mild): Secondary | ICD-10-CM | POA: Insufficient documentation

## 2019-09-09 DIAGNOSIS — E871 Hypo-osmolality and hyponatremia: Secondary | ICD-10-CM

## 2019-09-09 NOTE — Progress Notes (Signed)
*  PRELIMINARY RESULTS* Echocardiogram 2D Echocardiogram has been performed.  Natasha Phillips 09/09/2019, 10:51 AM

## 2019-09-15 ENCOUNTER — Encounter: Payer: Self-pay | Admitting: Internal Medicine

## 2019-09-15 DIAGNOSIS — E1165 Type 2 diabetes mellitus with hyperglycemia: Secondary | ICD-10-CM | POA: Diagnosis not present

## 2019-09-16 DIAGNOSIS — H269 Unspecified cataract: Secondary | ICD-10-CM | POA: Diagnosis not present

## 2019-09-16 DIAGNOSIS — H3581 Retinal edema: Secondary | ICD-10-CM | POA: Diagnosis not present

## 2019-09-16 DIAGNOSIS — E113211 Type 2 diabetes mellitus with mild nonproliferative diabetic retinopathy with macular edema, right eye: Secondary | ICD-10-CM | POA: Diagnosis not present

## 2019-09-25 ENCOUNTER — Encounter: Payer: Self-pay | Admitting: Internal Medicine

## 2019-09-30 DIAGNOSIS — R2689 Other abnormalities of gait and mobility: Secondary | ICD-10-CM | POA: Diagnosis not present

## 2019-09-30 DIAGNOSIS — N183 Chronic kidney disease, stage 3 unspecified: Secondary | ICD-10-CM | POA: Diagnosis not present

## 2019-09-30 DIAGNOSIS — R42 Dizziness and giddiness: Secondary | ICD-10-CM | POA: Diagnosis not present

## 2019-09-30 DIAGNOSIS — G939 Disorder of brain, unspecified: Secondary | ICD-10-CM | POA: Diagnosis not present

## 2019-10-04 ENCOUNTER — Other Ambulatory Visit: Payer: Self-pay | Admitting: Internal Medicine

## 2019-10-04 DIAGNOSIS — M1990 Unspecified osteoarthritis, unspecified site: Secondary | ICD-10-CM

## 2019-10-04 DIAGNOSIS — J309 Allergic rhinitis, unspecified: Secondary | ICD-10-CM

## 2019-10-04 DIAGNOSIS — E119 Type 2 diabetes mellitus without complications: Secondary | ICD-10-CM

## 2019-10-04 MED ORDER — MELOXICAM 15 MG PO TABS: 15.0000 mg | ORAL_TABLET | ORAL | 0 refills | Status: DC | PRN

## 2019-10-04 MED ORDER — FLUTICASONE PROPIONATE 50 MCG/ACT NA SUSP: 2.0000 | Freq: Every day | NASAL | 11 refills | Status: DC

## 2019-10-05 DIAGNOSIS — H2513 Age-related nuclear cataract, bilateral: Secondary | ICD-10-CM | POA: Diagnosis not present

## 2019-10-05 DIAGNOSIS — H40053 Ocular hypertension, bilateral: Secondary | ICD-10-CM | POA: Diagnosis not present

## 2019-10-05 DIAGNOSIS — E113413 Type 2 diabetes mellitus with severe nonproliferative diabetic retinopathy with macular edema, bilateral: Secondary | ICD-10-CM | POA: Diagnosis not present

## 2019-10-11 ENCOUNTER — Telehealth: Payer: Self-pay | Admitting: Internal Medicine

## 2019-10-11 NOTE — Telephone Encounter (Signed)
Pt called to let Dr. French Ana know that she is constipated and is having acid reflux issues and wanted to know what she needed to do.. Also wanted to know if it was ok for her to get her Covid vaccine

## 2019-10-11 NOTE — Telephone Encounter (Signed)
Pt has a few questions for PCP but would not disclose-please call

## 2019-10-12 NOTE — Telephone Encounter (Signed)
Pepcid AC or prilosec otc for GERD Constipation can try otc miralax daily, warm prune juice or senna/colace (docusate) otc

## 2019-10-12 NOTE — Telephone Encounter (Signed)
Please advise 

## 2019-10-13 ENCOUNTER — Encounter: Payer: Self-pay | Admitting: Internal Medicine

## 2019-10-13 NOTE — Telephone Encounter (Signed)
Pt called back wanting to talk to Arianna about message below

## 2019-10-14 NOTE — Telephone Encounter (Signed)
She can try senna colace constipation otc   Does she want ot see vascular surgery to check her leg circulation?

## 2019-10-14 NOTE — Telephone Encounter (Signed)
Left message to return call 

## 2019-10-14 NOTE — Telephone Encounter (Signed)
Left message with patient to call back and that I would send a mychart also.   Sent

## 2019-10-14 NOTE — Telephone Encounter (Signed)
[name], [name]  [name], [name], MD 12 hours ago (10:40 PM)   Hi Dr. French Ana, I am reaching out because I contacted your office three times this week and have not received a return call yet so I thought I would try sending you a message. I am suffering from constipation - for about a week now. But because of my CKD I am limited at what I can take. I have tried Dulcolax, Miralax (taken only one time due to CKD), hot liquids, prunes, fiber with no relief. Can you tell me what I am able to take to relieve this due to my condition?    Additionally, as a check-in I am still experiencing some imbalance and a feeling of heaviness in my legs.  I do not think it's the blood pressure pills.   Thank you in advance for your response. Virgina Organ.

## 2019-10-18 NOTE — Telephone Encounter (Signed)
Please advise 

## 2019-10-19 DIAGNOSIS — Z23 Encounter for immunization: Secondary | ICD-10-CM | POA: Diagnosis not present

## 2019-10-22 ENCOUNTER — Other Ambulatory Visit: Payer: Self-pay | Admitting: Internal Medicine

## 2019-10-22 DIAGNOSIS — M79605 Pain in left leg: Secondary | ICD-10-CM

## 2019-10-22 DIAGNOSIS — M79604 Pain in right leg: Secondary | ICD-10-CM

## 2019-10-26 DIAGNOSIS — E119 Type 2 diabetes mellitus without complications: Secondary | ICD-10-CM | POA: Diagnosis not present

## 2019-10-26 DIAGNOSIS — I10 Essential (primary) hypertension: Secondary | ICD-10-CM | POA: Diagnosis not present

## 2019-10-26 DIAGNOSIS — R42 Dizziness and giddiness: Secondary | ICD-10-CM | POA: Diagnosis not present

## 2019-10-26 DIAGNOSIS — R0600 Dyspnea, unspecified: Secondary | ICD-10-CM | POA: Diagnosis not present

## 2019-10-26 DIAGNOSIS — R002 Palpitations: Secondary | ICD-10-CM | POA: Diagnosis not present

## 2019-11-12 ENCOUNTER — Other Ambulatory Visit: Payer: Self-pay

## 2019-11-12 ENCOUNTER — Ambulatory Visit (INDEPENDENT_AMBULATORY_CARE_PROVIDER_SITE_OTHER): Payer: Medicare Other | Admitting: Vascular Surgery

## 2019-11-12 ENCOUNTER — Encounter (INDEPENDENT_AMBULATORY_CARE_PROVIDER_SITE_OTHER): Payer: Self-pay | Admitting: Vascular Surgery

## 2019-11-12 VITALS — BP 176/82 | HR 98 | Resp 16 | Wt 181.2 lb

## 2019-11-12 DIAGNOSIS — E119 Type 2 diabetes mellitus without complications: Secondary | ICD-10-CM

## 2019-11-12 DIAGNOSIS — M79605 Pain in left leg: Secondary | ICD-10-CM | POA: Diagnosis not present

## 2019-11-12 DIAGNOSIS — M79604 Pain in right leg: Secondary | ICD-10-CM | POA: Diagnosis not present

## 2019-11-12 DIAGNOSIS — M79609 Pain in unspecified limb: Secondary | ICD-10-CM | POA: Insufficient documentation

## 2019-11-12 DIAGNOSIS — R600 Localized edema: Secondary | ICD-10-CM | POA: Diagnosis not present

## 2019-11-12 DIAGNOSIS — I10 Essential (primary) hypertension: Secondary | ICD-10-CM

## 2019-11-12 DIAGNOSIS — N182 Chronic kidney disease, stage 2 (mild): Secondary | ICD-10-CM

## 2019-11-12 NOTE — Assessment & Plan Note (Signed)
Start evaluation with noninvasive studies to avoid contrast which could potentially harm the kidneys.

## 2019-11-12 NOTE — Assessment & Plan Note (Signed)
blood glucose control important in reducing the progression of atherosclerotic disease. Also, involved in wound healing. On appropriate medications.  

## 2019-11-12 NOTE — Assessment & Plan Note (Signed)
Recommend:  The patient has atypical pain symptoms for pure atherosclerotic disease. 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  Noninvasive studies including ABI's and venous ultrasound of the legs will be obtained and the patient will follow up with me to review these studies.  I suspect the patient is c/o pseudoclaudication.  Patient should have an evaluation of his LS spine which I defer to the primary service.  The patient should continue walking and begin a more formal exercise program. The patient should continue his antiplatelet therapy and aggressive treatment of the lipid abnormalities.  The patient should begin wearing graduated compression socks 15-20 mmHg strength to control edema.  

## 2019-11-12 NOTE — Progress Notes (Signed)
Patient ID: Natasha Phillips, female   DOB: [birth year], 69 y.o.   MRN: [id number]  Chief Complaint  Patient presents with  . New Patient (Initial Visit)    ref [name] ble pain    HPI Natasha Phillips is a 69 y.o. female.  I am asked to see the patient by Dr. Judie Grieve for evaluation of leg pain.  For the last couple of years, the patient has had some increasing cramping and pain in her legs with walking.  This starts up in the hip and buttock area and radiates down her legs.  Both lower extremities are affected.  The left leg might be a little bit worse.  She started to notice this more severely after she was admitted for hyponatremia last year, but it was happening before that as well.  She has also been diagnosed with sciatica in the past but currently that has not been bothering her that much.  She does have multiple atherosclerotic risk factors as listed below, and her primary care provider astutely noted that she had not yet been checked for circulatory issues.  She has no previous history of DVT or superficial thrombophlebitis to her knowledge.  No history of vascular disease elsewhere to her knowledge.  No ulceration or infection.  No fever or chills.  She does get intermittent swelling.  The swelling is actually worse on the right leg than the left leg.  She has worn compression stockings and does so intermittently.     Past Medical History:  Diagnosis Date  . Anemia   . Diabetes mellitus without complication (HCC)   . Glaucoma    [facility] with macular edema and Dr. Frederico Hamman sees Q3-4 months  . Hypertension     Past Surgical History:  Procedure Laterality Date  . BIOPSY THYROID    . CESAREAN SECTION     1981 1988  . TUBAL LIGATION       Family History  Problem Relation Age of Onset  . Diabetes Mother   . Heart failure Mother   . Diabetes Sister   . Healthy Sister   . Healthy Sister   . Healthy Sister   . Diabetes Brother   . Diabetes Sister   .  Heart failure Father   . Brain cancer Brother   . Throat cancer Brother   . Glaucoma Other        Runs on Paternal Side  . Breast cancer Cousin       Social History   Tobacco Use  . Smoking status: Never Smoker  . Smokeless tobacco: Never Used  Substance Use Topics  . Alcohol use: No  . Drug use: No     Allergies  Allergen Reactions  . Hctz [Hydrochlorothiazide]     Low na 124   . Januvia [Sitagliptin] Diarrhea  . Sulfa Antibiotics     Current Outpatient Medications  Medication Sig Dispense Refill  . amLODipine (NORVASC) 2.5 MG tablet Take 1 tablet (2.5 mg total) by mouth daily. If BP>130/>80 90 tablet 3  . amLODipine (NORVASC) 5 MG tablet Take 1 tablet (5 mg total) by mouth daily. Additional 2.5 mg norvasc if BP >130/>80 90 tablet 3  . aspirin EC 81 MG tablet Take 81 mg by mouth daily.    Marland Kitchen BLACK ELDERBERRY PO Take by mouth daily.    . cyclobenzaprine (FLEXERIL) 5 MG tablet Take 1 tablet (5 mg total) by mouth at bedtime as needed for muscle spasms. 30 tablet 2  .  fluticasone (FLONASE) 50 MCG/ACT nasal spray Place 2 sprays into both nostrils daily. 16 g 11  . loratadine (CLARITIN) 10 MG tablet Take 10 mg by mouth daily as needed for allergies.    . magnesium oxide (MAG-OX) 400 MG tablet Take 400 mg by mouth daily.    . meloxicam (MOBIC) 15 MG tablet Take 1 tablet (15 mg total) by mouth as needed. 90 tablet 0  . metFORMIN (GLUCOPHAGE) 1000 MG tablet Take 1 tablet (1,000 mg total) by mouth daily. 90 tablet 4  . Multiple Vitamin (MULTIVITAMIN WITH MINERALS) TABS tablet Take 1 tablet by mouth daily.    . saxagliptin HCl (ONGLYZA) 2.5 MG TABS tablet Take 1 tablet (2.5 mg total) by mouth daily. 30 tablet 5  . triamcinolone cream (KENALOG) 0.1 % Apply 1 application topically 2 (two) times daily. Left lower leg bid prn 80 g 0  . vitamin C (ASCORBIC ACID) 500 MG tablet Take 500 mg by mouth daily.    Marland Kitchen zinc sulfate 220 (50 Zn) MG capsule Take 220 mg by mouth daily.    .  furosemide (LASIX) 20 MG tablet Take 1 tablet (20 mg total) by mouth daily as needed for fluid or edema. (Patient not taking: Reported on 08/24/2019) 30 tablet 3   No current facility-administered medications for this visit.      REVIEW OF SYSTEMS (Negative unless checked)  Constitutional: [] Weight loss  [] Fever  [] Chills Cardiac: [] Chest pain   [] Chest pressure   [] Palpitations   [] Shortness of breath when laying flat   [] Shortness of breath at rest   [] Shortness of breath with exertion. Vascular:  [x] Pain in legs with walking   [x] Pain in legs at rest   [] Pain in legs when laying flat   [] Claudication   [] Pain in feet when walking  [] Pain in feet at rest  [] Pain in feet when laying flat   [] History of DVT   [] Phlebitis   [] Swelling in legs   [] Varicose veins   [] Non-healing ulcers Pulmonary:   [] Uses home oxygen   [] Productive cough   [] Hemoptysis   [] Wheeze  [] COPD   [] Asthma Neurologic:  [] Dizziness  [] Blackouts   [] Seizures   [] History of stroke   [] History of TIA  [] Aphasia   [] Temporary blindness   [] Dysphagia   [] Weakness or numbness in arms   [] Weakness or numbness in legs Musculoskeletal:  [] Arthritis   [] Joint swelling   [] Joint pain   [] Low back pain Hematologic:  [] Easy bruising  [] Easy bleeding   [] Hypercoagulable state   [] Anemic  [] Hepatitis Gastrointestinal:  [] Blood in stool   [] Vomiting blood  [] Gastroesophageal reflux/heartburn   [] Abdominal pain Genitourinary:  [] Chronic kidney disease   [] Difficult urination  [] Frequent urination  [] Burning with urination   [] Hematuria Skin:  [] Rashes   [] Ulcers   [] Wounds Psychological:  [] History of anxiety   []  History of major depression.    Physical Exam BP (!) 176/82 (BP Location: Right Arm)   Pulse 98   Resp 16   Wt 181 lb 3.2 oz (82.2 kg)   BMI 34.24 kg/m  Gen:  WD/WN, NAD. Appears younger than stated age. Head: [place]/AT, No temporalis wasting.  Ear/Nose/Throat: Hearing grossly intact, nares w/o erythema or drainage,  oropharynx w/o Erythema/Exudate Eyes: Conjunctiva clear, sclera non-icteric  Neck: trachea midline.  No JVD.  Pulmonary:  Good air movement, respirations not labored, no use of accessory muscles  Cardiac: RRR, no JVD Vascular:  Vessel Right Left  Radial Palpable Palpable  DP 1+ 2+  PT 1+ 1+   Gastrointestinal:. No masses, surgical incisions, or scars. Musculoskeletal: M/S 5/5 throughout.  Extremities without ischemic changes.  No deformity or atrophy. Trace BLE edema. Neurologic: Sensation grossly intact in extremities.  Symmetrical.  Speech is fluent. Motor exam as listed above. Psychiatric: Judgment intact, Mood & affect appropriate for pt's clinical situation. Dermatologic: No rashes or ulcers noted.  No cellulitis or open wounds.    Radiology No results found.  Labs Recent Results (from the past 2160 hour(s))  Vitamin B12     Status: None   Collection Time: 08/24/19 11:58 AM  Result Value Ref Range   Vitamin B-12 763 211 - 911 pg/mL  Folate     Status: None   Collection Time: 08/24/19 11:58 AM  Result Value Ref Range   Folate >24.1 >5.9 ng/mL  Vitamin B1     Status: None   Collection Time: 08/24/19 11:58 AM  Result Value Ref Range   Vitamin B1 (Thiamine) 12 8 - 30 nmol/L    Comment: Marland Kitchen Vitamin supplementation within 24 hours prior to blood draw may affect the accuracy of the results. . This test was developed and its analytical performance characteristics have been determined by [place], [place]. It has not been cleared or approved by the U.S. Food and Drug Administration. This assay has been validated pursuant to the CLIA regulations and is used for clinical purposes. .   Vitamin B6     Status: Abnormal   Collection Time: 08/24/19 11:58 AM  Result Value Ref Range   Vitamin B6 30.5 (H) 2.1 - 21.7 ng/mL    Comment: Marland Kitchen Vitamin supplementation within 24 hours prior to blood draw may affect the  accuracy of the results. . This test was developed and its analytical performance characteristics have been determined by [place], [place]. It has not been cleared or approved by the U.S. Food and Drug Administration. This assay has been validated pursuant to the CLIA regulations and is used for clinical purposes. .   Protein Electrophoresis, (serum)     Status: None   Collection Time: 08/24/19 11:58 AM  Result Value Ref Range   Total Protein 8.3 6.0 - 8.5 g/dL   Albumin ELP 4.4 2.9 - 4.4 g/dL   Alpha 1 0.2 0.0 - 0.4 g/dL   Alpha 2 0.9 0.4 - 1.0 g/dL   Beta 1.1 0.7 - 1.3 g/dL   Gamma Globulin 1.7 0.4 - 1.8 g/dL   M-Spike, % Not Observed Not Observed g/dL   Globulin, Total 3.9 2.2 - 3.9 g/dL   A/G Ratio 1.1 0.7 - 1.7   Please Note: Comment     Comment: Protein electrophoresis scan will follow via computer, mail, or courier delivery.    Interpretation: Comment     Comment: The SPE pattern appears unremarkable. Evidence of monoclonal protein is not apparent.   Protein Electro, Random Urine     Status: None   Collection Time: 08/24/19 11:58 AM  Result Value Ref Range   Protein, Ur 7.1 Not Estab. mg/dL   Albumin ELP, Urine 100.0 %   Alpha-1-Globulin, U 0.0 %   Alpha-2-Globulin, U 0.0 %   Beta Globulin, U 0.0 %   Gamma Globulin, U 0.0 %   M Component, Ur Not Observed Not Observed %   Please Note: Comment     Comment: Protein electrophoresis scan will follow via computer, mail, or courier delivery.     Assessment/Plan:  Essential hypertension blood pressure control important in reducing the progression of atherosclerotic disease. On appropriate oral medications.   Controlled type 2 diabetes mellitus without complication (HCC) blood glucose control important in reducing the progression of atherosclerotic disease. Also, involved in wound healing. On appropriate medications.   CKD (chronic kidney disease) stage 2, GFR 60-89 ml/min Start  evaluation with noninvasive studies to avoid contrast which could potentially harm the kidneys.  Leg edema Compression and elevation are the hallmarks of treatment of swelling initially from any cause.  Would recommend that she wear her stockings regularly and elevate her legs as tolerated.  Pain in limb  Recommend:  The patient has atypical pain symptoms for pure atherosclerotic disease. 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  Noninvasive studies including ABI's and venous ultrasound of the legs will be obtained and the patient will follow up with me to review these studies.  I suspect the patient is c/o pseudoclaudication.  Patient should have an evaluation of his LS spine which I defer to the primary service.  The patient should continue walking and begin a more formal exercise program. The patient should continue his antiplatelet therapy and aggressive treatment of the lipid abnormalities.  The patient should begin wearing graduated compression socks 15-20 mmHg strength to control edema.       Festus Barren 11/12/2019, 10:40 AM   This note was created with Dragon medical transcription system.  Any errors from dictation are unintentional.

## 2019-11-12 NOTE — Patient Instructions (Signed)
Peripheral Vascular Disease  Peripheral vascular disease (PVD) is a disease of the blood vessels that are not part of your heart and brain. A simple term for PVD is poor circulation. In most cases, PVD narrows the blood vessels that carry blood from your heart to the rest of your body. This can reduce the supply of blood to your arms, legs, and internal organs, like your stomach or kidneys. However, PVD most often affects a person's lower legs and feet. Without treatment, PVD tends to get worse. PVD can also lead to acute ischemic limb. This is when an arm or leg suddenly cannot get enough blood. This is a medical emergency. Follow these instructions at home: Lifestyle  Do not use any products that contain nicotine or tobacco, such as cigarettes and e-cigarettes. If you need help quitting, ask your doctor.  Lose weight if you are overweight. Or, stay at a healthy weight as told by your doctor.  Eat a diet that is low in fat and cholesterol. If you need help, ask your doctor.  Exercise regularly. Ask your doctor for activities that are right for you. General instructions  Take over-the-counter and prescription medicines only as told by your doctor.  Take good care of your feet: ? Wear comfortable shoes that fit well. ? Check your feet often for any cuts or sores.  Keep all follow-up visits as told by your doctor This is important. Contact a doctor if:  You have cramps in your legs when you walk.  You have leg pain when you are at rest.  You have coldness in a leg or foot.  Your skin changes.  You are unable to get or have an erection (erectile dysfunction).  You have cuts or sores on your feet that do not heal. Get help right away if:  Your arm or leg turns cold, numb, and blue.  Your arms or legs become red, warm, swollen, painful, or numb.  You have chest pain.  You have trouble breathing.  You suddenly have weakness in your face, arm, or leg.  You become very  confused or you cannot speak.  You suddenly have a very bad headache.  You suddenly cannot see. Summary  Peripheral vascular disease (PVD) is a disease of the blood vessels.  A simple term for PVD is poor circulation. Without treatment, PVD tends to get worse.  Treatment may include exercise, low fat and low cholesterol diet, and quitting smoking. This information is not intended to replace advice given to you by your health care provider. Make sure you discuss any questions you have with your health care provider. Document Revised: 06/06/2017 Document Reviewed: 08/01/2016 Elsevier Patient Education  2020 Else[employer] Inc.  

## 2019-11-12 NOTE — Assessment & Plan Note (Signed)
Compression and elevation are the hallmarks of treatment of swelling initially from any cause.  Would recommend that she wear her stockings regularly and elevate her legs as tolerated.

## 2019-11-12 NOTE — Assessment & Plan Note (Signed)
blood pressure control important in reducing the progression of atherosclerotic disease. On appropriate oral medications.  

## 2019-11-14 ENCOUNTER — Encounter: Payer: Self-pay | Admitting: Internal Medicine

## 2019-11-15 DIAGNOSIS — I129 Hypertensive chronic kidney disease with stage 1 through stage 4 chronic kidney disease, or unspecified chronic kidney disease: Secondary | ICD-10-CM | POA: Diagnosis not present

## 2019-11-15 DIAGNOSIS — E1122 Type 2 diabetes mellitus with diabetic chronic kidney disease: Secondary | ICD-10-CM | POA: Diagnosis not present

## 2019-11-15 DIAGNOSIS — N1831 Chronic kidney disease, stage 3a: Secondary | ICD-10-CM | POA: Diagnosis not present

## 2019-11-16 ENCOUNTER — Other Ambulatory Visit: Payer: Self-pay | Admitting: Internal Medicine

## 2019-11-16 DIAGNOSIS — E119 Type 2 diabetes mellitus without complications: Secondary | ICD-10-CM

## 2019-11-16 DIAGNOSIS — Z23 Encounter for immunization: Secondary | ICD-10-CM | POA: Diagnosis not present

## 2019-11-16 MED ORDER — METFORMIN HCL 1000 MG PO TABS: 1000.0000 mg | ORAL_TABLET | Freq: Every day | ORAL | 4 refills | Status: DC

## 2019-11-16 NOTE — Telephone Encounter (Signed)
Pt called to inquire about MyChart message -please advise

## 2019-11-18 ENCOUNTER — Other Ambulatory Visit (INDEPENDENT_AMBULATORY_CARE_PROVIDER_SITE_OTHER): Payer: Self-pay | Admitting: Vascular Surgery

## 2019-11-18 ENCOUNTER — Other Ambulatory Visit (INDEPENDENT_AMBULATORY_CARE_PROVIDER_SITE_OTHER): Payer: Self-pay | Admitting: Nurse Practitioner

## 2019-11-18 DIAGNOSIS — R600 Localized edema: Secondary | ICD-10-CM

## 2019-11-18 DIAGNOSIS — M79604 Pain in right leg: Secondary | ICD-10-CM

## 2019-11-18 DIAGNOSIS — M79605 Pain in left leg: Secondary | ICD-10-CM

## 2019-11-22 ENCOUNTER — Other Ambulatory Visit: Payer: Self-pay

## 2019-11-22 ENCOUNTER — Ambulatory Visit (INDEPENDENT_AMBULATORY_CARE_PROVIDER_SITE_OTHER): Payer: Medicare Other

## 2019-11-22 ENCOUNTER — Encounter (INDEPENDENT_AMBULATORY_CARE_PROVIDER_SITE_OTHER): Payer: Self-pay | Admitting: Nurse Practitioner

## 2019-11-22 ENCOUNTER — Ambulatory Visit (INDEPENDENT_AMBULATORY_CARE_PROVIDER_SITE_OTHER): Payer: Medicare Other | Admitting: Nurse Practitioner

## 2019-11-22 VITALS — BP 148/79 | HR 80 | Ht 61.0 in | Wt 178.0 lb

## 2019-11-22 DIAGNOSIS — M79604 Pain in right leg: Secondary | ICD-10-CM | POA: Diagnosis not present

## 2019-11-22 DIAGNOSIS — R600 Localized edema: Secondary | ICD-10-CM | POA: Diagnosis not present

## 2019-11-22 DIAGNOSIS — I10 Essential (primary) hypertension: Secondary | ICD-10-CM | POA: Diagnosis not present

## 2019-11-22 DIAGNOSIS — R252 Cramp and spasm: Secondary | ICD-10-CM

## 2019-11-22 DIAGNOSIS — M79605 Pain in left leg: Secondary | ICD-10-CM

## 2019-11-22 NOTE — Progress Notes (Signed)
Subjective:    Patient ID: Natasha Phillips, female    DOB: 14-Nov-1950, 69 y.o.   MRN: [id number] Chief Complaint  Patient presents with  . Follow-up    U/S follow up    Natasha Phillips is a 69 y.o. female.  The patient returns the office today in evaluation for leg cramping.  The patient states that she has some increased cramping and pain in her legs with walking. Start with antibiotic. Tends to radiate down the legs. The patient also does note that she has sciatica within the left lower extremity. This hasn't given her many issues at all lately. The patient also notes that the cramps are not predictable based on ambulation or activity. She states that the cramps can happen at night, when she sitting when she is active there is no predictable pattern. The patient even has cramps within her hands. The patient does have multiple atherosclerotic issues however has not had any previous interventions. She denies any rest pain like symptoms. No previous history of DVT or superficial thrombophlebitis. Recently the patient was admitted to the hospital for hyponatremia and noticed that the cramps have been much more frequently after that. The patient does wear compression stockings and notes that it helps to control her lower extremity edema. She notes that the swelling is worse on the right leg versus the left. However the patient swelling is not continuous. She may swell once every couple of weeks.  Today noninvasive study showed evidence of superficial reflux in the great saphenous vein at the saphenofemoral junction in the left lower extremity. Otherwise there is no evidence of deep venous insufficiency bilaterally. There is no evidence of superficial venous insufficiency in the right lower extremity. There is no evidence of DVT or superficial venous thrombosis bilaterally.  Today noninvasive studies show the patient has an ABI of 1.16 on the right and 1.26 on the left. The patient has a TBI 0.95 on the  right and 0.89 on the left. The patient has strong triphasic tibial artery waveforms bilaterally with good toe waveforms bilaterally.     Review of Systems  Cardiovascular: Positive for leg swelling.       Cramping, in legs and hands  Musculoskeletal:       Sciatic pain  All other systems reviewed and are negative.      Objective:   Physical Exam Vitals reviewed.  Constitutional:      Appearance: Normal appearance.  HENT:     Head: Normocephalic.  Cardiovascular:     Rate and Rhythm: Normal rate and regular rhythm.     Pulses: Normal pulses.     Heart sounds: Normal heart sounds.  Pulmonary:     Effort: Pulmonary effort is normal.     Breath sounds: Normal breath sounds.  Musculoskeletal:        General: Normal range of motion.  Skin:    General: Skin is warm and dry.  Neurological:     Mental Status: She is alert and oriented to person, place, and time.  Psychiatric:        Mood and Affect: Mood normal.        Behavior: Behavior normal.        Thought Content: Thought content normal.        Judgment: Judgment normal.     BP (!) 148/79   Pulse 80   Ht 5\' 1"  (1.549 m)   Wt 178 lb (80.7 kg)   BMI 33.63 kg/m   Past  Medical History:  Diagnosis Date  . Anemia   . Diabetes mellitus without complication (HCC)   . Glaucoma    [facility] with macular edema and Dr. Frederico Hamman sees Q3-4 months  . Hypertension     Social History   Socioeconomic History  . Marital status: Widowed    Spouse name: Not on file  . Number of children: 2  . Years of education: College  . Highest education level: Not on file  Occupational History  . 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 Smoking status: Never Smoker  . Smokeless tobacco: Never Used  Substance and Sexual Activity  . Alcohol use: No  . Drug use: No  . Sexual activity: Not Currently    Birth control/protection: None  Other Topics Concern  . Not on file  Social History  Narrative   Lives at home    Social Determinants of Health   Financial Resource Strain: Low Risk   . Difficulty of Paying Living Expenses: Not hard at all  Food Insecurity: No Food Insecurity  . Worried About Programme researcher, broadcasting/film/video in the Last Year: Never true  . Ran Out of Food in the Last Year: Never true  Transportation Needs: No Transportation Needs  . Lack of Transportation (Medical): No  . Lack of Transportation (Non-Medical): No  Physical Activity: Inactive  . Days of Exercise per Week: 0 days  . Minutes of Exercise per Session: 0 min  Stress: No Stress Concern Present  . Feeling of Stress : Not at all  Social Connections: Unknown  . Frequency of Communication with Friends and Family: Patient refused  . Frequency of Social Gatherings with Friends and Family: Patient refused  . Attends Religious Services: Patient refused  . Active Member of Clubs or Organizations: Patient refused  . Attends Banker Meetings: Patient refused  . Marital Status: Patient refused  Intimate Partner Violence: Unknown  . Fear of Current or Ex-Partner: Patient refused  . Emotionally Abused: Patient refused  . Physically Abused: Patient refused  . Sexually Abused: Patient refused    Past Surgical History:  Procedure Laterality Date  . BIOPSY THYROID    . CESAREAN SECTION     1981 1988  . TUBAL LIGATION      Family History  Problem Relation Age of Onset  . Diabetes Mother   . Heart failure Mother   . Diabetes Sister   . Healthy Sister   . Healthy Sister   . Healthy Sister   . Diabetes Brother   . Diabetes Sister   . Heart failure Father   . Brain cancer Brother   . Throat cancer Brother   . Glaucoma Other        Runs on Paternal Side  . Breast cancer Cousin     Allergies  Allergen Reactions  . Hctz [Hydrochlorothiazide]     Low na 124   . Januvia [Sitagliptin] Diarrhea  . Sulfa Antibiotics        Assessment & Plan:   1. Leg cramps Recommend:  The patient  has atypical pain symptoms for vascular disease  I do not find evidence of Vascular pathology that would explain the patient's symptoms and I suspect the patient is c/o pseudoclaudication.  Patient should have an evaluation of his LS spine which I defer to the primary service. The patient's cramping could also be caused by fluid and electrolyte imbalances, given that many of the symptoms happen  after episode of hyponatremia last year.  Noninvasive studies including venous ultrasound of the legs do not identify vascular problems  The patient should continue walking and begin a more formal exercise program. The patient should continue his antiplatelet therapy and aggressive treatment of the lipid abnormalities. The patient should begin wearing graduated compression socks 15-20 mmHg strength to control her mild edema.  Patient will follow-up with me on a PRN basis  Further work-up of her lower extremity pain is deferred to the primary service     2. Essential hypertension Continue antihypertensive medications as already ordered, these medications have been reviewed and there are no changes at this time.   3. Leg edema No surgery or intervention at this point in time.  I have reviewed my discussion with the patient regarding venous insufficiency and why it causes symptoms. I have discussed with the patient the chronic skin changes that accompany venous insufficiency and the long term sequela such as ulceration. Patient will contnue wearing graduated compression stockings on a daily basis, as this has provided excellent control of his edema. The patient will put the stockings on first thing in the morning and removing them in the evening. The patient is reminded not to sleep in the stockings.  In addition, behavioral modification including elevation during the day will be initiated. Exercise is strongly encouraged.  Given the patient's good control and lack of any problems regarding the venous  insufficiency and lymphedema a lymph pump in not need at this time.  The patient will follow up with me PRN should anything change.  The patient voices agreement with this plan.    Current Outpatient Medications on File Prior to Visit  Medication Sig Dispense Refill  . amLODipine (NORVASC) 2.5 MG tablet Take 1 tablet (2.5 mg total) by mouth daily. If BP>130/>80 90 tablet 3  . amLODipine (NORVASC) 5 MG tablet Take 1 tablet (5 mg total) by mouth daily. Additional 2.5 mg norvasc if BP >130/>80 90 tablet 3  . aspirin EC 81 MG tablet Take 81 mg by mouth daily.    Marland Kitchen BLACK ELDERBERRY PO Take by mouth daily.    . cyclobenzaprine (FLEXERIL) 5 MG tablet Take 1 tablet (5 mg total) by mouth at bedtime as needed for muscle spasms. 30 tablet 2  . fluticasone (FLONASE) 50 MCG/ACT nasal spray Place 2 sprays into both nostrils daily. 16 g 11  . furosemide (LASIX) 20 MG tablet Take 1 tablet (20 mg total) by mouth daily as needed for fluid or edema. (Patient not taking: Reported on 08/24/2019) 30 tablet 3  . loratadine (CLARITIN) 10 MG tablet Take 10 mg by mouth daily as needed for allergies.    . magnesium oxide (MAG-OX) 400 MG tablet Take 400 mg by mouth daily.    . meloxicam (MOBIC) 15 MG tablet Take 1 tablet (15 mg total) by mouth as needed. 90 tablet 0  . metFORMIN (GLUCOPHAGE) 1000 MG tablet Take 1 tablet (1,000 mg total) by mouth daily. 90 tablet 4  . Multiple Vitamin (MULTIVITAMIN WITH MINERALS) TABS tablet Take 1 tablet by mouth daily.    . saxagliptin HCl (ONGLYZA) 2.5 MG TABS tablet Take 1 tablet (2.5 mg total) by mouth daily. 30 tablet 5  . triamcinolone cream (KENALOG) 0.1 % Apply 1 application topically 2 (two) times daily. Left lower leg bid prn (Patient not taking: Reported on 11/22/2019) 80 g 0  . vitamin C (ASCORBIC ACID) 500 MG tablet Take 500 mg by mouth daily.    Marland Kitchen  zinc sulfate 220 (50 Zn) MG capsule Take 220 mg by mouth daily.     No current facility-administered medications on file prior to  visit.    There are no Patient Instructions on file for this visit. No follow-ups on file.   Georgiana Spinner, NP

## 2019-11-23 ENCOUNTER — Encounter (INDEPENDENT_AMBULATORY_CARE_PROVIDER_SITE_OTHER): Payer: Self-pay | Admitting: Nurse Practitioner

## 2019-11-23 ENCOUNTER — Telehealth (INDEPENDENT_AMBULATORY_CARE_PROVIDER_SITE_OTHER): Payer: Self-pay

## 2019-11-23 NOTE — Telephone Encounter (Signed)
Patient has been made aware with medical advice and verbalized understanding 

## 2019-11-23 NOTE — Telephone Encounter (Signed)
No this is related her veins and it just means that her valves are a bit leaky.  As far as the results yesterday, none of the test results would cause her legs to go out or does it put her at risk for a possible amputation.  The patient has excellent ARTERIAL flow which is what determines blood flow down to her toes.  As discussed yesterday, any issues she is having her legs should now be discussed with her PCP as her vascular studies showed no significant issues.

## 2019-11-24 ENCOUNTER — Other Ambulatory Visit: Payer: Self-pay

## 2019-11-24 ENCOUNTER — Ambulatory Visit (INDEPENDENT_AMBULATORY_CARE_PROVIDER_SITE_OTHER): Payer: Medicare Other | Admitting: Nurse Practitioner

## 2019-11-24 ENCOUNTER — Ambulatory Visit (INDEPENDENT_AMBULATORY_CARE_PROVIDER_SITE_OTHER): Payer: Medicare Other

## 2019-11-24 ENCOUNTER — Encounter (INDEPENDENT_AMBULATORY_CARE_PROVIDER_SITE_OTHER): Payer: Medicare Other

## 2019-11-24 ENCOUNTER — Ambulatory Visit (INDEPENDENT_AMBULATORY_CARE_PROVIDER_SITE_OTHER): Payer: Medicare Other | Admitting: Internal Medicine

## 2019-11-24 ENCOUNTER — Encounter: Payer: Self-pay | Admitting: Internal Medicine

## 2019-11-24 VITALS — BP 138/80 | HR 82 | Temp 97.1°F | Ht 61.0 in | Wt 178.0 lb

## 2019-11-24 DIAGNOSIS — I10 Essential (primary) hypertension: Secondary | ICD-10-CM | POA: Diagnosis not present

## 2019-11-24 DIAGNOSIS — E2839 Other primary ovarian failure: Secondary | ICD-10-CM

## 2019-11-24 DIAGNOSIS — M549 Dorsalgia, unspecified: Secondary | ICD-10-CM

## 2019-11-24 DIAGNOSIS — R252 Cramp and spasm: Secondary | ICD-10-CM | POA: Diagnosis not present

## 2019-11-24 DIAGNOSIS — M5442 Lumbago with sciatica, left side: Secondary | ICD-10-CM | POA: Diagnosis not present

## 2019-11-24 DIAGNOSIS — M47812 Spondylosis without myelopathy or radiculopathy, cervical region: Secondary | ICD-10-CM

## 2019-11-24 DIAGNOSIS — E041 Nontoxic single thyroid nodule: Secondary | ICD-10-CM

## 2019-11-24 DIAGNOSIS — M5441 Lumbago with sciatica, right side: Secondary | ICD-10-CM | POA: Diagnosis not present

## 2019-11-24 DIAGNOSIS — M546 Pain in thoracic spine: Secondary | ICD-10-CM | POA: Diagnosis not present

## 2019-11-24 DIAGNOSIS — R251 Tremor, unspecified: Secondary | ICD-10-CM

## 2019-11-24 DIAGNOSIS — G250 Essential tremor: Secondary | ICD-10-CM | POA: Diagnosis not present

## 2019-11-24 DIAGNOSIS — Z1231 Encounter for screening mammogram for malignant neoplasm of breast: Secondary | ICD-10-CM

## 2019-11-24 DIAGNOSIS — E669 Obesity, unspecified: Secondary | ICD-10-CM

## 2019-11-24 MED ORDER — PROPRANOLOL HCL 10 MG PO TABS: 10.0000 mg | ORAL_TABLET | Freq: Every day | ORAL | 3 refills | Status: DC

## 2019-11-24 NOTE — Progress Notes (Signed)
Chief Complaint  Patient presents with  . Follow-up   F/u  1. Muscle cramps still c/o this vascular thinks pseudoclaudication and no significant PVD and rec lumbar spine evaluation  Reviewed labs from 11/15/19 Cr 1.05 and GFR 64 improved   2. BP sl elevated today on norvasc 2.5 mg qam and 5 mg qhs lasix 20 mg prn Prev hctz caused low na and could not tolerate acei, c/w coreg causing elevated sugar   3. C/o hand tremor at rest 4. Cervical arthritis   Review of Systems  Constitutional: Negative for weight loss.  HENT: Negative for hearing loss.   Eyes: Negative for blurred vision.  Respiratory: Negative for shortness of breath.   Cardiovascular: Negative for chest pain.  Gastrointestinal: Negative for abdominal pain.  Musculoskeletal: Positive for back pain. Negative for neck pain.       Neck pain improved for now  +muscle cramps  Skin: Negative for rash.  Neurological: Positive for tremors.  Psychiatric/Behavioral: Negative for depression and memory loss.   Past Medical History:  Diagnosis Date  . Anemia   . Diabetes mellitus without complication ([place])   . Glaucoma    [facility] with macular edema and Dr. Rupert Stacks sees Q3-4 months  . Hypertension    Past Surgical History:  Procedure Laterality Date  . BIOPSY THYROID    . [place]  . TUBAL LIGATION     Family History  Problem Relation Age of Onset  . Diabetes Mother   . Heart failure Mother   . Diabetes Sister   . Healthy Sister   . Healthy Sister   . Healthy Sister   . Diabetes Brother   . Diabetes Sister   . Heart failure Father   . Brain cancer Brother   . Throat cancer Brother   . Glaucoma Other        Runs on Paternal Side  . Breast cancer Cousin    Social History   Socioeconomic History  . Marital status: Widowed    Spouse name: Not on file  . Number of children: 2  . Years of education: College  . Highest education level: Not on file  Occupational History  . Occupation:  Retired 2014    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 [employer]  Tobacco Use  . Smoking status: Never Smoker  . Smokeless tobacco: Never Used  Substance and Sexual Activity  . Alcohol use: No  . Drug use: No  . Sexual activity: Not Currently    Birth control/protection: None  Other Topics Concern  . Not on file  Social History Narrative   Lives at home    Social Determinants of Health   Financial Resource Strain: Low Risk   . Difficulty of Paying Living Expenses: Not hard at all  Food Insecurity: No Food Insecurity  . Worried About Charity fundraiser in the Last Year: Never true  . Ran Out of Food in the Last Year: Never true  Transportation Needs: No Transportation Needs  . Lack of Transportation (Medical): No  . Lack of Transportation (Non-Medical): No  Physical Activity: Inactive  . Days of Exercise per Week: 0 days  . Minutes of Exercise per Session: 0 min  Stress: No Stress Concern Present  . Feeling of Stress : Not at all  Social Connections: Unknown  . Frequency of Communication with Friends and Family: Patient refused  . Frequency of Social Gatherings with Friends and Family: Patient refused  . Attends Religious  Services: Patient refused  . Active Member of Clubs or Organizations: Patient refused  . Attends Banker Meetings: Patient refused  . Marital Status: Patient refused  Intimate Partner Violence: Unknown  . Fear of Current or Ex-Partner: Patient refused  . Emotionally Abused: Patient refused  . Physically Abused: Patient refused  . Sexually Abused: Patient refused   Current Meds  Medication Sig  . amLODipine (NORVASC) 2.5 MG tablet Take 1 tablet (2.5 mg total) by mouth daily. If BP>130/>80 (Patient taking differently: Take 2.5 mg by mouth daily. If BP>130/>80 in the am)  . amLODipine (NORVASC) 5 MG tablet Take 1 tablet (5 mg total) by mouth daily. Additional 2.5 mg norvasc if BP >130/>80 (Patient taking differently: Take 5 mg by mouth daily. Taking qd at  night Additional 2.5 mg norvasc if BP >130/>80)  . aspirin EC 81 MG tablet Take 81 mg by mouth daily.  . fluticasone (FLONASE) 50 MCG/ACT nasal spray Place 2 sprays into both nostrils daily.  . furosemide (LASIX) 20 MG tablet Take 1 tablet (20 mg total) by mouth daily as needed for fluid or edema.  Marland Kitchen loratadine (CLARITIN) 10 MG tablet Take 10 mg by mouth daily as needed for allergies.  . magnesium oxide (MAG-OX) 400 MG tablet Take 400 mg by mouth daily.  . metFORMIN (GLUCOPHAGE) 1000 MG tablet Take 1 tablet (1,000 mg total) by mouth daily.  . Multiple Vitamin (MULTIVITAMIN WITH MINERALS) TABS tablet Take 1 tablet by mouth daily.  . saxagliptin HCl (ONGLYZA) 2.5 MG TABS tablet Take 1 tablet (2.5 mg total) by mouth daily.   Allergies  Allergen Reactions  . Hctz [Hydrochlorothiazide]     Low na 124   . Januvia [Sitagliptin] Diarrhea  . Sulfa Antibiotics    No results found for this or any previous visit (from the past 2160 hour(s)). Objective  Body mass index is 33.63 kg/m. Wt Readings from Last 3 Encounters:  11/24/19 178 lb (80.7 kg)  11/22/19 178 lb (80.7 kg)  11/12/19 181 lb 3.2 oz (82.2 kg)   Temp Readings from Last 3 Encounters:  11/24/19 (!) 97.1 F (36.2 C) (Temporal)  08/24/19 (!) 96.6 F (35.9 C) (Temporal)  08/06/19 98.1 F (36.7 C) (Skin)   BP Readings from Last 3 Encounters:  11/24/19 138/80  11/22/19 (!) 148/79  11/12/19 (!) 176/82   Pulse Readings from Last 3 Encounters:  11/24/19 82  11/22/19 80  11/12/19 98    Physical Exam Vitals and nursing note reviewed.  Constitutional:      Appearance: Normal appearance. She is well-developed and well-groomed. She is obese.  HENT:     Head: Normocephalic and atraumatic.  Eyes:     Conjunctiva/sclera: Conjunctivae normal.     Pupils: Pupils are equal, round, and reactive to light.  Cardiovascular:     Rate and Rhythm: Normal rate and regular rhythm.     Heart sounds: Normal heart sounds. No murmur.   Pulmonary:     Effort: Pulmonary effort is normal.     Breath sounds: Normal breath sounds.  Skin:    General: Skin is warm and dry.  Neurological:     General: No focal deficit present.     Mental Status: She is alert and oriented to person, place, and time. Mental status is at baseline.     Gait: Gait normal.     Comments: Mild hand tremor on exam, mild  Psychiatric:        Attention and Perception: Attention and perception  normal.        Mood and Affect: Mood and affect normal.        Speech: Speech normal.        Behavior: Behavior normal. Behavior is cooperative.        Thought Content: Thought content normal.        Cognition and Memory: Cognition and memory normal.        Judgment: Judgment normal.     Assessment  Plan  Muscle cramps So far w/u negative  If continues consider EMG/NCS with neurology Dr. Sherryll Burger appt 03/30/20  No source of leg pain via VVS w/u no PVD   Cervical arthritis Neck pain improved for now prev given exercises to do  Prn Tylenol avoid nsaids 2/2 GFR   Mid back pain - Plan: DG Thoracic Spine 2 View Low back pain with bilateral sciatica, unspecified back pain laterality, unspecified chronicity - Plan: DG Lumbar Spine Complete  Essential hypertension - Plan: propranolol (INDERAL) 10 MG tablet qhs continue norvasc 2.5 in am and 5 mg qhs   Tremor of both hands Essential tremor Added low dose BB  Obesity (BMI 30-39.9)  rec healthy diet and exercise   HM Flu shot declines prevnar utd  pna 25 utd no need to update due to age per guidelines but could consider Tdap utd  Consider shingrix vaccines in future  covid 19 2/2 moderna   Pap 05/13/19 negative neg HPV  mammo sch 08/09/19 neg  Colonoscopy had in3/25/2013per pt she was ~61 per chart review was normal though I am unable to review report pt states she had at [facility] -need to get copy of report due 09/29/21   dexa consider in futurewill try to get pt scheduled Never smoker but exposed to  2nd hand via husband 09/09/19 renal US duplex normal  09/19/15 thyroid US with 10/04/15 benign bx  IMPRESSION: 1. Thyromegaly with bilateral small nodules. Dominant right lesion meets consensus criteria for biopsy. Ultrasound-guided fine needle aspiration should be considered, as per the consensus statement: Management of Thyroid Nodules Detected at [place]: Society of Radiologists in Ultrasound Consensus Conference Statement. Radiology 2005; [id number].  Provider: Dr. French Ana [name]-Internal Medicine

## 2019-11-24 NOTE — Patient Instructions (Addendum)
Theraworx topical spray leg cramps  Flexeril if needed  Hylands supplement for muscle cramps consider this   Senna/colace (docusate) 1-2 pills as needed both laxative and stool softner  OR  Miralax as needed (laxative only)    Goal <130/<80  Consider hydralazine as needed or propranolol   Call cardiology for an appt for chest pain  Disc cramps in muscles with neurology to consider nerve study   Office Visit [facility]  [address]  [place], [place] [address]  [phone]  Mirian Capuchin, MD  [address]  [place], [place] [address]  [phone]  [phone] ([address]    [place], Renae Fickle, NP  [address]  [place], [place] [address]  [phone]  [phone] (Fax)  Essential hypertension (Primary Dx);  Type 2 diabetes mellitus without complication, without long-term current use of insulin (CMS-HCC);  Palpitation;  DOE (dyspnea on exertion);  Vertigo    Propranolol Tablets What is this medicine? PROPRANOLOL (proe PRAN oh lole) is a beta blocker. It decreases the amount of work your heart has to do and helps your heart beat regularly. It treats high blood pressure and/or prevent chest pain (also called angina). It is also used after a heart attack to prevent a second one. This medicine may be used for other purposes; ask your health care provider or pharmacist if you have questions. COMMON BRAND NAME(S): Inderal What should I tell my health care provider before I take this medicine? They need to know if you have any of these conditions:  circulation problems or blood vessel disease  diabetes  history of heart attack or heart disease, vasospastic angina  kidney disease  liver disease  lung or breathing disease, like asthma or emphysema  pheochromocytoma  slow heart rate  thyroid disease  an unusual or allergic reaction to propranolol, other beta-blockers, medicines, foods, dyes, or preservatives  pregnant or  trying to get pregnant  breast-feeding How should I use this medicine? Take this drug by mouth. Take it as directed on the prescription label at the same time every day. Keep taking it unless your health care provider tells you to stop. Talk to your health care provider about the use of this drug in children. Special care may be needed. Overdosage: If you think you have taken too much of this medicine contact a poison control center or emergency room at once. NOTE: This medicine is only for you. Do not share this medicine with others. What if I miss a dose? If you miss a dose, take it as soon as you can. If it is almost time for your next dose, take only that dose. Do not take double or extra doses. What may interact with this medicine? Do not take this medicine with any of the following medications:  feverfew  phenothiazines like chlorpromazine, mesoridazine, prochlorperazine, thioridazine This medicine may also interact with the following medications:  aluminum hydroxide gel  antipyrine  antiviral medicines for HIV or AIDS  barbiturates like phenobarbital  certain medicines for blood pressure, heart disease, irregular heart beat  cimetidine  ciprofloxacin  diazepam  fluconazole  haloperidol  isoniazid  medicines for cholesterol like cholestyramine or colestipol  medicines for mental depression  medicines for migraine headache like almotriptan, eletriptan, frovatriptan, naratriptan, rizatriptan, sumatriptan, zolmitriptan  NSAIDs, medicines for pain and inflammation, like ibuprofen or naproxen  phenytoin  rifampin  teniposide  theophylline  thyroid medicines  tolbutamide  warfarin  zileuton This list may not describe all possible interactions. Give your  health care provider a list of all the medicines, herbs, non-prescription drugs, or dietary supplements you use. Also tell them if you smoke, drink alcohol, or use illegal drugs. Some items may interact  with your medicine. What should I watch for while using this medicine? Visit your doctor or health care professional for regular check ups. Check your blood pressure and pulse rate regularly. Ask your health care professional what your blood pressure and pulse rate should be, and when you should contact them. You may get drowsy or dizzy. Do not drive, use machinery, or do anything that needs mental alertness until you know how this drug affects you. Do not stand or sit up quickly, especially if you are an older patient. This reduces the risk of dizzy or fainting spells. Alcohol can make you more drowsy and dizzy. Avoid alcoholic drinks. This medicine may increase blood sugar. Ask your healthcare provider if changes in diet or medicines are needed if you have diabetes. Do not treat yourself for coughs, colds, or pain while you are taking this medicine without asking your doctor or health care professional for advice. Some ingredients may increase your blood pressure. What side effects may I notice from receiving this medicine? Side effects that you should report to your doctor or health care professional as soon as possible:  allergic reactions like skin rash, itching or hives, swelling of the face, lips, or tongue  breathing problems  cold hands or feet  difficulty sleeping, nightmares  dry peeling skin  hallucinations  muscle cramps or weakness   signs and symptoms of high blood sugar such as being more thirsty or hungry or having to urinate more than normal. You may also feel very tired or have blurry vision.  slow heart rate  swelling of the legs and ankles  vomiting Side effects that usually do not require medical attention (report to your doctor or health care professional if they continue or are bothersome):  change in sex drive or performance  diarrhea  dry sore eyes  hair loss  nausea  weak or tired This list may not describe all possible side effects. Call your  doctor for medical advice about side effects. You may report side effects to FDA at 1-800-FDA-1088. Where should I keep my medicine? Keep out of the reach of children and pets. Store at room temperature between 20 and 25 degrees C (68 and 77 degrees F). Protect from light. Throw away any unused drug after the expiration date. NOTE: This sheet is a summary. It may not cover all possible information. If you have questions about this medicine, talk to your doctor, pharmacist, or health care provider.  2020 Elsevier/Gold Standard (2019-01-29 19:25:51)  Hydralazine tablets What is this medicine? HYDRALAZINE (hye DRAL a zeen) is a type of vasodilator. It relaxes blood vessels, increasing the blood and oxygen supply to your heart. This medicine is used to treat high blood pressure. This medicine may be used for other purposes; ask your health care provider or pharmacist if you have questions. COMMON BRAND NAME(S): Apresoline What should I tell my health care provider before I take this medicine? They need to know if you have any of these conditions:  blood vessel disease  heart disease including angina or history of heart attack  kidney or liver disease  systemic lupus erythematosus (SLE)  an unusual or allergic reaction to hydralazine, tartrazine dye, other medicines, foods, dyes, or preservatives  pregnant or trying to get pregnant  breast-feeding How should I use  this medicine? Take this medicine by mouth with a glass of water. Follow the directions on the prescription label. Take your doses at regular intervals. Do not take your medicine more often than directed. Do not stop taking except on the advice of your doctor or health care professional. Talk to your pediatrician regarding the use of this medicine in children. Special care may be needed. While this drug may be prescribed for children for selected conditions, precautions do apply. Overdosage: If you think you have taken too much  of this medicine contact a poison control center or emergency room at once. NOTE: This medicine is only for you. Do not share this medicine with others. What if I miss a dose? If you miss a dose, take it as soon as you can. If it is almost time for your next dose, take only that dose. Do not take double or extra doses. What may interact with this medicine?  medicines for high blood pressure  medicines for mental depression This list may not describe all possible interactions. Give your health care provider a list of all the medicines, herbs, non-prescription drugs, or dietary supplements you use. Also tell them if you smoke, drink alcohol, or use illegal drugs. Some items may interact with your medicine. What should I watch for while using this medicine? Visit your doctor or health care professional for regular checks on your progress. Check your blood pressure and pulse rate regularly. Ask your doctor or health care professional what your blood pressure and pulse rate should be and when you should contact him or her. You may get drowsy or dizzy. Do not drive, use machinery, or do anything that needs mental alertness until you know how this medicine affects you. Do not stand or sit up quickly, especially if you are an older patient. This reduces the risk of dizzy or fainting spells. Alcohol may interfere with the effect of this medicine. Avoid alcoholic drinks. Do not treat yourself for coughs, colds, or pain while you are taking this medicine without asking your doctor or health care professional for advice. Some ingredients may increase your blood pressure. What side effects may I notice from receiving this medicine? Side effects that you should report to your doctor or health care professional as soon as possible:  chest pain, or fast or irregular heartbeat  fever, chills, or sore throat  numbness or tingling in the hands or feet  shortness of breath  skin rash, redness, blisters or  itching  stiff or swollen joints  sudden weight gain  swelling of the feet or legs  swollen lymph glands  unusual weakness Side effects that usually do not require medical attention (report to your doctor or health care professional if they continue or are bothersome):  diarrhea, or constipation  headache  loss of appetite  nausea, vomiting This list may not describe all possible side effects. Call your doctor for medical advice about side effects. You may report side effects to FDA at 1-800-FDA-1088. Where should I keep my medicine? Keep out of the reach of children. Store at room temperature between 15 and 30 degrees C (59 and 86 degrees F). Throw away any unused medicine after the expiration date. NOTE: This sheet is a summary. It may not cover all possible information. If you have questions about this medicine, talk to your doctor, pharmacist, or health care provider.  2020 Elsevier/Gold Standard (2007-11-06 15:44:58)   Eustachian Tube Dysfunction  Eustachian tube dysfunction refers to a condition in which  a blockage develops in the narrow passage that connects the middle ear to the back of the nose (eustachian tube). The eustachian tube regulates air pressure in the middle ear by letting air move between the ear and nose. It also helps to drain fluid from the middle ear space. Eustachian tube dysfunction can affect one or both ears. When the eustachian tube does not function properly, air pressure, fluid, or both can build up in the middle ear. What are the causes? This condition occurs when the eustachian tube becomes blocked or cannot open normally. Common causes of this condition include:  Ear infections.  Colds and other infections that affect the nose, mouth, and throat (upper respiratory tract).  Allergies.  Irritation from cigarette smoke.  Irritation from stomach acid coming up into the esophagus (gastroesophageal reflux). The esophagus is the tube that  carries food from the mouth to the stomach.  Sudden changes in air pressure, such as from descending in an airplane or scuba diving.  Abnormal growths in the nose or throat, such as: ? Growths that line the nose (nasal polyps). ? Abnormal growth of cells (tumors). ? Enlarged tissue at the back of the throat (adenoids). What increases the risk? You are more likely to develop this condition if:  You smoke.  You are overweight.  You are a child who has: ? Certain birth defects of the mouth, such as cleft palate. ? Large tonsils or adenoids. What are the signs or symptoms? Common symptoms of this condition include:  A feeling of fullness in the ear.  Ear pain.  Clicking or popping noises in the ear.  Ringing in the ear.  Hearing loss.  Loss of balance.  Dizziness. Symptoms may get worse when the air pressure around you changes, such as when you travel to an area of high elevation, fly on an airplane, or go scuba diving. How is this diagnosed? This condition may be diagnosed based on:  Your symptoms.  A physical exam of your ears, nose, and throat.  Tests, such as those that measure: ? The movement of your eardrum (tympanogram). ? Your hearing (audiometry). How is this treated? Treatment depends on the cause and severity of your condition.  In mild cases, you may relieve your symptoms by moving air into your ears. This is called "popping the ears."  In more severe cases, or if you have symptoms of fluid in your ears, treatment may include: ? Medicines to relieve congestion (decongestants). ? Medicines that treat allergies (antihistamines). ? Nasal sprays or ear drops that contain medicines that reduce swelling (steroids). ? A procedure to drain the fluid in your eardrum (myringotomy). In this procedure, a small tube is placed in the eardrum to:  Drain the fluid.  Restore the air in the middle ear space. ? A procedure to insert a balloon device through the nose to  inflate the opening of the eustachian tube (balloon dilation). Follow these instructions at home: Lifestyle  Do not do any of the following until your health care provider approves: ? Travel to high altitudes. ? Fly in airplanes. ? Work in a Estate agent or room. ? Scuba dive.  Do not use any products that contain nicotine or tobacco, such as cigarettes and e-cigarettes. If you need help quitting, ask your health care provider.  Keep your ears dry. Wear fitted earplugs during showering and bathing. Dry your ears completely after. General instructions  Take over-the-counter and prescription medicines only as told by your health care provider.  Use techniques to help pop your ears as recommended by your health care provider. These may include: ? Chewing gum. ? Yawning. ? Frequent, forceful swallowing. ? Closing your mouth, holding your nose closed, and gently blowing as if you are trying to blow air out of your nose.  Keep all follow-up visits as told by your health care provider. This is important. Contact a health care provider if:  Your symptoms do not go away after treatment.  Your symptoms come back after treatment.  You are unable to pop your ears.  You have: ? A fever. ? Pain in your ear. ? Pain in your head or neck. ? Fluid draining from your ear.  Your hearing suddenly changes.  You become very dizzy.  You lose your balance. Summary  Eustachian tube dysfunction refers to a condition in which a blockage develops in the eustachian tube.  It can be caused by ear infections, allergies, inhaled irritants, or abnormal growths in the nose or throat.  Symptoms include ear pain, hearing loss, or ringing in the ears.  Mild cases are treated with maneuvers to unblock the ears, such as yawning or ear popping.  Severe cases are treated with medicines. Surgery may also be done (rare). This information is not intended to replace advice given to you by your health  care provider. Make sure you discuss any questions you have with your health care provider. Document Revised: 10/14/2017 Document Reviewed: 10/14/2017 Elsevier Patient Education  2020 Elsevier Inc.  Leg Cramps Leg cramps occur when one or more muscles tighten and you have no control over this tightening (involuntary muscle contraction). Muscle cramps can develop in any muscle, but the most common place is in the calf muscles of the leg. Those cramps can occur during exercise or when you are at rest. Leg cramps are painful, and they may last for a few seconds to a few minutes. Cramps may return several times before they finally stop. Usually, leg cramps are not caused by a serious medical problem. In many cases, the cause is not known. Some common causes include:  Excessive physical effort (overexertion), such as during intense exercise.  Overuse from repetitive motions, or doing the same thing over and over.  Staying in a certain position for a long period of time.  Improper preparation, form, or technique while performing a sport or an activity.  Dehydration.  Injury.  Side effects of certain medicines.  Abnormally low levels of minerals in your blood (electrolytes), especially potassium and calcium. This could result from: ? Pregnancy. ? Taking diuretic medicines. Follow these instructions at home: Eating and drinking  Drink enough fluid to keep your urine pale yellow. Staying hydrated may help prevent cramps.  Eat a healthy diet that includes plenty of nutrients to help your muscles function. A healthy diet includes fruits and vegetables, lean protein, whole grains, and low-fat or nonfat dairy products. Managing pain, stiffness, and swelling      Try massaging, stretching, and relaxing the affected muscle. Do this for several minutes at a time.  If directed, put ice on areas that are sore or painful after a cramp: ? Put ice in a plastic bag. ? Place a towel between your  skin and the bag. ? Leave the ice on for 20 minutes, 2-3 times a day.  If directed, apply heat to muscles that are tense or tight. Do this before you exercise, or as often as told by your health care provider. Use the heat source that your  health care provider recommends, such as a moist heat pack or a heating pad. ? Place a towel between your skin and the heat source. ? Leave the heat on for 20-30 minutes. ? Remove the heat if your skin turns bright red. This is especially important if you are unable to feel pain, heat, or cold. You may have a greater risk of getting burned.  Try taking hot showers or baths to help relax tight muscles. General instructions  If you are having frequent leg cramps, avoid intense exercise for several days.  Take over-the-counter and prescription medicines only as told by your health care provider.  Keep all follow-up visits as told by your health care provider. This is important. Contact a health care provider if:  Your leg cramps get more severe or more frequent, or they do not improve over time.  Your foot becomes cold, numb, or blue. Summary  Muscle cramps can develop in any muscle, but the most common place is in the calf muscles of the leg.  Leg cramps are painful, and they may last for a few seconds to a few minutes.  Usually, leg cramps are not caused by a serious medical problem. Often, the cause is not known.  Stay hydrated and take over-the-counter and prescription medicines only as told by your health care provider. This information is not intended to replace advice given to you by your health care provider. Make sure you discuss any questions you have with your health care provider. Document Revised: 06/06/2017 Document Reviewed: 04/03/2017 Elsevier Patient Education  2020 Elsevier Inc.    Essential Tremor A tremor is trembling or shaking that a person cannot control. Most tremors affect the hands or arms. Tremors can also affect the  head, vocal cords, legs, and other parts of the body. Essential tremor is a tremor without a known cause. Usually, it occurs while a person is trying to perform an action. It tends to get worse gradually as a person ages. What are the causes? The cause of this condition is not known. What increases the risk? You are more likely to develop this condition if:  You have a family member with essential tremor.  You are age 45 or older.  You take certain medicines. What are the signs or symptoms? The main sign of a tremor is a rhythmic shaking of certain parts of your body that is uncontrolled and unintentional. You may:  Have difficulty eating with a spoon or fork.  Have difficulty writing.  Nod your head up and down or side to side.  Have a quivering voice. The shaking may:  Get worse over time.  Come and go.  Be more noticeable on one side of your body.  Get worse due to stress, fatigue, caffeine, and extreme heat or cold. How is this diagnosed? This condition may be diagnosed based on:  Your symptoms and medical history.  A physical exam. There is no single test to diagnose an essential tremor. However, your health care provider may order tests to rule out other causes of your condition. These may include:  Blood and urine tests.  Imaging studies of your brain, such as CT scan and MRI.  A test that measures involuntary muscle movement (electromyogram). How is this treated? Treatment for essential tremor depends on the severity of the condition.  Some tremors may go away without treatment.  Mild tremors may not need treatment if they do not affect your day-to-day life.  Severe tremors may need to be  treated using one or more of the following options: ? Medicines. ? Lifestyle changes. ? Occupational or physical therapy. Follow these instructions at home: Lifestyle   Do not use any products that contain nicotine or tobacco, such as cigarettes and e-cigarettes. If  you need help quitting, ask your health care provider.  Limit your caffeine intake as told by your health care provider.  Try to get 8 hours of sleep each night.  Find ways to manage your stress that fits your lifestyle and personality. Consider trying meditation or yoga.  Try to anticipate stressful situations and allow extra time to manage them.  If you are struggling emotionally with the effects of your tremor, consider working with a mental health provider. General instructions  Take over-the-counter and prescription medicines only as told by your health care provider.  Avoid extreme heat and extreme cold.  Keep all follow-up visits as told by your health care provider. This is important. Visits may include physical therapy visits. Contact a health care provider if:  You experience any changes in the location or intensity of your tremors.  You start having a tremor after starting a new medicine.  You have tremor with other symptoms, such as: ? Numbness. ? Tingling. ? Pain. ? Weakness.  Your tremor gets worse.  Your tremor interferes with your daily life.  You feel down, blue, or sad for at least 2 weeks in a row.  Worrying about your tremor and what other people think about you interferes with your everyday life functions, including relationships, work, or school. Summary  Essential tremor is a tremor without a known cause. Usually, it occurs when you are trying to perform an action.  The cause of this condition is not known.  The main sign of a tremor is a rhythmic shaking of certain parts of your body that is uncontrolled and unintentional.  Treatment for essential tremor depends on the severity of the condition. This information is not intended to replace advice given to you by your health care provider. Make sure you discuss any questions you have with your health care provider. Document Revised: 07/04/2017 Document Reviewed: 07/04/2017 Elsevier Patient  Education  2020 [employer].

## 2019-11-26 DIAGNOSIS — M47812 Spondylosis without myelopathy or radiculopathy, cervical region: Secondary | ICD-10-CM | POA: Insufficient documentation

## 2019-11-29 ENCOUNTER — Encounter: Payer: Self-pay | Admitting: Internal Medicine

## 2019-12-08 ENCOUNTER — Telehealth: Payer: Self-pay | Admitting: Internal Medicine

## 2019-12-08 NOTE — Telephone Encounter (Signed)
She can try to cut propranolol in 1/2 dose or take norvasc in the am 1x per day with full dose propranolol at night  TMS

## 2019-12-08 NOTE — Telephone Encounter (Signed)
Patient informed and verbalized understanding.  States her bottom lower has been lower than 60. Was 118/48 yesterday. States when the bottom number is less than 60 during the day she takes both of the Amlodipine at night.

## 2019-12-08 NOTE — Telephone Encounter (Signed)
Please advise 

## 2019-12-08 NOTE — Telephone Encounter (Signed)
Patient was prescribed propranolol (INDERAL) 10 MG tablet. She would like to know if she should be taken this with her other bp medication and what is the side effects. If it lowers her bp she is concerned because her bp is already low.Please advise.

## 2019-12-08 NOTE — Telephone Encounter (Signed)
Yes she already discussed side effects with her via my chart  Too low BP is <90<60 otherwise I am not worried unless feeling dizzy/lightheaded   TMS

## 2019-12-09 NOTE — Telephone Encounter (Signed)
Left message to return call 

## 2019-12-10 ENCOUNTER — Other Ambulatory Visit: Payer: Self-pay | Admitting: Internal Medicine

## 2019-12-10 DIAGNOSIS — I10 Essential (primary) hypertension: Secondary | ICD-10-CM

## 2019-12-10 MED ORDER — AMLODIPINE BESYLATE 2.5 MG PO TABS: 2.5000 mg | ORAL_TABLET | Freq: Two times a day (BID) | ORAL | 3 refills | Status: DC

## 2019-12-10 NOTE — Telephone Encounter (Signed)
No answer, no voicemail.

## 2019-12-10 NOTE — Telephone Encounter (Signed)
Stop norvasc 5 mg take norvasc 2.5 mg qd and if If BP>130/>80 take 2nd dose hold 2nd dose if BP <90/<60  F/u with cardiology for low BP and to further w/u dizziness consider Holter and sch appt sooner than 03/2020

## 2019-12-10 NOTE — Telephone Encounter (Signed)
Patient states she was not taking the propranolol as of yet. Her blood pressure on the lower number has been in the 40s-60s. Spoke with Dr French Ana and informed the Patient that she recommends not starting the medication.   Patient takes 2.5 amlodipine in the morning and 5 mg at night. She states that if she takes her BP in the morning and it is low, she will not take the 2.5 mg. However, by night her BP is high and she will take both (7.5 mg dose) at night to drop her BP. Informed the Patient that this could be why her BP drops in the morning.   Please advise  on how Patient should take Amlodipine.   Patient states that whenever she is active her BP will drop. She will get light headed, dizzy, and is concerned that one day she will faint.   Patient sees Cardiology and states that all of her EKG's and blood test come back normal. Asked the patient if she has ever had an Holter monitor placed on her. She says no. She sees Cardiology again in September.   Should patient see them sooner for a possible Holter placement? If so could we send that message to her Cardiologist at [facility] as she states she feels no one will listen to her.   Please advise

## 2019-12-10 NOTE — Telephone Encounter (Signed)
Patient returned office call

## 2019-12-13 ENCOUNTER — Other Ambulatory Visit: Payer: Self-pay | Admitting: Family Medicine

## 2019-12-13 ENCOUNTER — Telehealth: Payer: Self-pay | Admitting: Internal Medicine

## 2019-12-13 DIAGNOSIS — E119 Type 2 diabetes mellitus without complications: Secondary | ICD-10-CM

## 2019-12-13 NOTE — Telephone Encounter (Signed)
-----   Message from Bevelyn Buckles, MD sent at 11/26/2019  2:47 PM EDT ----- Consider EMG/NCS at f/u 03/2020 due to muscle cramps Fax note and on cover TMS

## 2019-12-13 NOTE — Telephone Encounter (Signed)
Patient informed and verbalized understanding.  Med list has already been updated. She will await a call from Cardiology.

## 2019-12-13 NOTE — Telephone Encounter (Signed)
Faxed via Epic Routing.

## 2019-12-21 ENCOUNTER — Other Ambulatory Visit: Payer: Self-pay | Admitting: Internal Medicine

## 2019-12-21 DIAGNOSIS — N1831 Chronic kidney disease, stage 3a: Secondary | ICD-10-CM | POA: Diagnosis not present

## 2019-12-21 DIAGNOSIS — E119 Type 2 diabetes mellitus without complications: Secondary | ICD-10-CM

## 2019-12-21 DIAGNOSIS — I129 Hypertensive chronic kidney disease with stage 1 through stage 4 chronic kidney disease, or unspecified chronic kidney disease: Secondary | ICD-10-CM | POA: Diagnosis not present

## 2019-12-21 DIAGNOSIS — R8290 Unspecified abnormal findings in urine: Secondary | ICD-10-CM | POA: Diagnosis not present

## 2019-12-21 DIAGNOSIS — E1122 Type 2 diabetes mellitus with diabetic chronic kidney disease: Secondary | ICD-10-CM | POA: Diagnosis not present

## 2019-12-21 MED ORDER — ONGLYZA 2.5 MG PO TABS: 2.5000 mg | ORAL_TABLET | Freq: Every day | ORAL | 3 refills | Status: DC

## 2019-12-22 ENCOUNTER — Encounter: Payer: Self-pay | Admitting: Internal Medicine

## 2019-12-24 ENCOUNTER — Telehealth: Payer: Self-pay | Admitting: Internal Medicine

## 2019-12-24 NOTE — Telephone Encounter (Signed)
Faxed Request for new statin prescription to [facility]-12-24-19

## 2019-12-27 ENCOUNTER — Other Ambulatory Visit: Payer: Self-pay | Admitting: Internal Medicine

## 2019-12-27 DIAGNOSIS — I10 Essential (primary) hypertension: Secondary | ICD-10-CM

## 2019-12-27 DIAGNOSIS — N182 Chronic kidney disease, stage 2 (mild): Secondary | ICD-10-CM

## 2019-12-27 MED ORDER — LOSARTAN POTASSIUM 25 MG PO TABS: 25.0000 mg | ORAL_TABLET | Freq: Every day | ORAL | 3 refills | Status: DC

## 2020-01-04 DIAGNOSIS — E113413 Type 2 diabetes mellitus with severe nonproliferative diabetic retinopathy with macular edema, bilateral: Secondary | ICD-10-CM | POA: Diagnosis not present

## 2020-01-18 DIAGNOSIS — N1831 Chronic kidney disease, stage 3a: Secondary | ICD-10-CM | POA: Diagnosis not present

## 2020-01-18 DIAGNOSIS — E1122 Type 2 diabetes mellitus with diabetic chronic kidney disease: Secondary | ICD-10-CM | POA: Diagnosis not present

## 2020-01-18 DIAGNOSIS — R8290 Unspecified abnormal findings in urine: Secondary | ICD-10-CM | POA: Diagnosis not present

## 2020-01-18 DIAGNOSIS — I129 Hypertensive chronic kidney disease with stage 1 through stage 4 chronic kidney disease, or unspecified chronic kidney disease: Secondary | ICD-10-CM | POA: Diagnosis not present

## 2020-02-15 NOTE — Telephone Encounter (Signed)
error 

## 2020-02-23 ENCOUNTER — Encounter: Payer: Self-pay | Admitting: Internal Medicine

## 2020-02-25 DIAGNOSIS — N1831 Chronic kidney disease, stage 3a: Secondary | ICD-10-CM | POA: Diagnosis not present

## 2020-02-25 DIAGNOSIS — E1122 Type 2 diabetes mellitus with diabetic chronic kidney disease: Secondary | ICD-10-CM | POA: Diagnosis not present

## 2020-02-25 DIAGNOSIS — I129 Hypertensive chronic kidney disease with stage 1 through stage 4 chronic kidney disease, or unspecified chronic kidney disease: Secondary | ICD-10-CM | POA: Diagnosis not present

## 2020-03-02 DIAGNOSIS — H25013 Cortical age-related cataract, bilateral: Secondary | ICD-10-CM | POA: Diagnosis not present

## 2020-03-02 DIAGNOSIS — H3581 Retinal edema: Secondary | ICD-10-CM | POA: Diagnosis not present

## 2020-03-02 DIAGNOSIS — E113213 Type 2 diabetes mellitus with mild nonproliferative diabetic retinopathy with macular edema, bilateral: Secondary | ICD-10-CM | POA: Diagnosis not present

## 2020-03-22 ENCOUNTER — Telehealth: Payer: Self-pay | Admitting: Internal Medicine

## 2020-03-22 NOTE — Telephone Encounter (Signed)
Pt called and wants PCP to know that she has bright red blood in stool after constipation. Pt wants to talk about it at next appt on Tuesday.

## 2020-03-24 NOTE — Telephone Encounter (Signed)
Left message informing Patient that Dr French Ana [name] is aware of her call in. Also informed that I would send a detailed mychart message. Done.

## 2020-03-24 NOTE — Telephone Encounter (Signed)
Noted we can disc maybe hemorrhoids and I need to look back there  She may need to see GI

## 2020-03-28 ENCOUNTER — Other Ambulatory Visit: Payer: Self-pay

## 2020-03-28 ENCOUNTER — Encounter: Payer: Self-pay | Admitting: Internal Medicine

## 2020-03-28 ENCOUNTER — Ambulatory Visit (INDEPENDENT_AMBULATORY_CARE_PROVIDER_SITE_OTHER): Payer: Medicare Other | Admitting: Internal Medicine

## 2020-03-28 ENCOUNTER — Telehealth: Payer: Self-pay | Admitting: Internal Medicine

## 2020-03-28 VITALS — BP 140/90 | HR 86 | Temp 97.5°F | Ht 61.0 in | Wt 173.0 lb

## 2020-03-28 DIAGNOSIS — K921 Melena: Secondary | ICD-10-CM | POA: Diagnosis not present

## 2020-03-28 DIAGNOSIS — K5900 Constipation, unspecified: Secondary | ICD-10-CM

## 2020-03-28 DIAGNOSIS — E119 Type 2 diabetes mellitus without complications: Secondary | ICD-10-CM | POA: Diagnosis not present

## 2020-03-28 DIAGNOSIS — E1122 Type 2 diabetes mellitus with diabetic chronic kidney disease: Secondary | ICD-10-CM | POA: Diagnosis not present

## 2020-03-28 DIAGNOSIS — N1831 Chronic kidney disease, stage 3a: Secondary | ICD-10-CM | POA: Diagnosis not present

## 2020-03-28 DIAGNOSIS — I129 Hypertensive chronic kidney disease with stage 1 through stage 4 chronic kidney disease, or unspecified chronic kidney disease: Secondary | ICD-10-CM | POA: Diagnosis not present

## 2020-03-28 LAB — HEMOCCULT GUIAC POC 1CARD (OFFICE): Fecal Occult Blood, POC: NEGATIVE

## 2020-03-28 LAB — POCT GLYCOSYLATED HEMOGLOBIN (HGB A1C): Hemoglobin A1C: 6.5 % — AB (ref 4.0–5.6)

## 2020-03-28 NOTE — Patient Instructions (Addendum)
Benefiber/metamucil  Dulcolax =laxative  Colace=stool softner  Kiwis  Warm water Ginger tea  Goal blood pressure <130/<80 You could take another norvasc 2.5 daily if needed to control let me know    Hyperglycemia Hyperglycemia occurs when the level of sugar (glucose) in the blood is too high. Glucose is a type of sugar that provides the body's main source of energy. Certain hormones (insulin and glucagon) control the level of glucose in the blood. Insulin lowers blood glucose, and glucagon increases blood glucose. Hyperglycemia can result from having too little insulin in the bloodstream, or from the body not responding normally to insulin. Hyperglycemia occurs most often in people who have diabetes (diabetes mellitus), but it can happen in people who do not have diabetes. It can develop quickly, and it can be life-threatening if it causes you to become severely dehydrated (diabetic ketoacidosis or hyperglycemic hyperosmolar state). Severe hyperglycemia is a medical emergency. What are the causes? If you have diabetes, hyperglycemia may be caused by:  Diabetes medicine.  Medicines that increase blood glucose or affect your diabetes control.  Not eating enough, or not eating often enough.  Changes in physical activity level.  Being sick or having an infection. If you have prediabetes or undiagnosed diabetes:  Hyperglycemia may be caused by those conditions. If you do not have diabetes, hyperglycemia may be caused by:  Certain medicines, including steroid medicines, beta-blockers, epinephrine, and thiazide diuretics.  Stress.  Serious illness.  Surgery.  Diseases of the pancreas.  Infection. What increases the risk? Hyperglycemia is more likely to develop in people who have risk factors for diabetes, such as:  Having a family member with diabetes.  Having a gene for type 1 diabetes that is passed from parent to child (inherited).  Living in an area with cold weather  conditions.  Exposure to certain viruses.  Certain conditions in which the body's disease-fighting (immune) system attacks itself (autoimmune disorders).  Being overweight or obese.  Having an inactive (sedentary) lifestyle.  Having been diagnosed with insulin resistance.  Having a history of prediabetes, gestational diabetes, or polycystic ovarian syndrome (PCOS).  Being of American-Indian, African-American, Hispanic/Latino, or Asian/Pacific Islander descent. What are the signs or symptoms? Hyperglycemia may not cause any symptoms. If you do have symptoms, they may include early warning signs, such as:  Increased thirst.  Hunger.  Feeling very tired.  Needing to urinate more often than usual.  Blurry vision. Other symptoms may develop if hyperglycemia gets worse, such as:  Dry mouth.  Loss of appetite.  Fruity-smelling breath.  Weakness.  Unexpected or rapid weight gain or weight loss.  Tingling or numbness in the hands or feet.  Headache.  Skin that does not quickly return to normal after being lightly pinched and released (poor skin turgor).  Abdominal pain.  Cuts or bruises that are slow to heal. How is this diagnosed? Hyperglycemia is diagnosed with a blood test to measure your blood glucose level. This blood test is usually done while you are having symptoms. Your health care provider may also do a physical exam and review your medical history. You may have more tests to determine the cause of your hyperglycemia, such as:  A fasting blood glucose (FBG) test. You will not be allowed to eat (you will fast) for at least 8 hours before a blood sample is taken.  An A1c (hemoglobin A1c) blood test. This provides information about blood glucose control over the previous 2-3 months.  An oral glucose tolerance test (OGTT). This  measures your blood glucose at two times: ? After fasting. This is your baseline blood glucose level. ? Two hours after drinking a  beverage that contains glucose. How is this treated? Treatment depends on the cause of your hyperglycemia. Treatment may include:  Taking medicine to regulate your blood glucose levels. If you take insulin or other diabetes medicines, your medicine or dosage may be adjusted.  Lifestyle changes, such as exercising more, eating healthier foods, or losing weight.  Treating an illness or infection, if this caused your hyperglycemia.  Checking your blood glucose more often.  Stopping or reducing steroid medicines, if these caused your hyperglycemia. If your hyperglycemia becomes severe and it results in hyperglycemic hyperosmolar state, you must be hospitalized and given IV fluids. Follow these instructions at home:  General instructions  Take over-the-counter and prescription medicines only as told by your health care provider.  Do not use any products that contain nicotine or tobacco, such as cigarettes and e-cigarettes. If you need help quitting, ask your health care provider.  Limit alcohol intake to no more than 1 drink per day for nonpregnant women and 2 drinks per day for men. One drink equals 12 oz of beer, 5 oz of wine, or 1 oz of hard liquor.  Learn to manage stress. If you need help with this, ask your health care provider.  Keep all follow-up visits as told by your health care provider. This is important. Eating and drinking   Maintain a healthy weight.  Exercise regularly, as directed by your health care provider.  Stay hydrated, especially when you exercise, get sick, or spend time in hot temperatures.  Eat healthy foods, such as: ? Lean proteins. ? Complex carbohydrates. ? Fresh fruits and vegetables. ? Low-fat dairy products. ? Healthy fats.  Drink enough fluid to keep your urine clear or pale yellow. If you have diabetes:  Make sure you know the symptoms of hyperglycemia.  Follow your diabetes management plan, as told by your health care provider. Make  sure you: ? Take your insulin and medicines as directed. ? Follow your exercise plan. ? Follow your meal plan. Eat on time, and do not skip meals. ? Check your blood glucose as often as directed. Make sure to check your blood glucose before and after exercise. If you exercise longer or in a different way than usual, check your blood glucose more often. ? Follow your sick day plan whenever you cannot eat or drink normally. Make this plan in advance with your health care provider.  Share your diabetes management plan with people in your workplace, school, and household.  Check your urine for ketones when you are ill and as told by your health care provider.  Carry a medical alert card or wear medical alert jewelry. Contact a health care provider if:  Your blood glucose is at or above 240 mg/dL (53.9 mmol/L) for 2 days in a row.  You have problems keeping your blood glucose in your target range.  You have frequent episodes of hyperglycemia. Get help right away if:  You have difficulty breathing.  You have a change in how you think, feel, or act (mental status).  You have nausea or vomiting that does not go away. These symptoms may represent a serious problem that is an emergency. Do not wait to see if the symptoms will go away. Get medical help right away. Call your local emergency services (911 in the [place] Do not drive yourself to the hospital. Summary  Hyperglycemia  occurs when the level of sugar (glucose) in the blood is too high.  Hyperglycemia is diagnosed with a blood test to measure your blood glucose level. This blood test is usually done while you are having symptoms. Your health care provider may also do a physical exam and review your medical history.  If you have diabetes, follow your diabetes management plan as told by your health care provider.  Contact your health care provider if you have problems keeping your blood glucose in your target range. This information is  not intended to replace advice given to you by your health care provider. Make sure you discuss any questions you have with your health care provider. Document Revised: 03/11/2016 Document Reviewed: 03/11/2016 Elsevier Patient Education  2020 [employer].  Constipation, Adult Constipation is when a person has fewer bowel movements in a week than normal, has difficulty having a bowel movement, or has stools that are dry, hard, or larger than normal. Constipation may be caused by an underlying condition. It may become worse with age if a person takes certain medicines and does not take in enough fluids. Follow these instructions at home: Eating and drinking   Eat foods that have a lot of fiber, such as fresh fruits and vegetables, whole grains, and beans.  Limit foods that are high in fat, low in fiber, or overly processed, such as french fries, hamburgers, cookies, candies, and soda.  Drink enough fluid to keep your urine clear or pale yellow. General instructions  Exercise regularly or as told by your health care provider.  Go to the restroom when you have the urge to go. Do not hold it in.  Take over-the-counter and prescription medicines only as told by your health care provider. These include any fiber supplements.  Practice pelvic floor retraining exercises, such as deep breathing while relaxing the lower abdomen and pelvic floor relaxation during bowel movements.  Watch your condition for any changes.  Keep all follow-up visits as told by your health care provider. This is important. Contact a health care provider if:  You have pain that gets worse.  You have a fever.  You do not have a bowel movement after 4 days.  You vomit.  You are not hungry.  You lose weight.  You are bleeding from the anus.  You have thin, pencil-like stools. Get help right away if:  You have a fever and your symptoms suddenly get worse.  You leak stool or have blood in your  stool.  Your abdomen is bloated.  You have severe pain in your abdomen.  You feel dizzy or you faint. This information is not intended to replace advice given to you by your health care provider. Make sure you discuss any questions you have with your health care provider. Document Revised: 06/06/2017 Document Reviewed: 12/13/2015 Elsevier Patient Education  2020 [employer].

## 2020-03-28 NOTE — Progress Notes (Signed)
Chief Complaint  Patient presents with  . Follow-up  . Hypertension   F/u  1. HTN elevated today on norvasc 2.5 prefers BRAND at home BP 124/80  2. Constipation and blood in stools since week of 03/20/20 c/o blood in stool with straining M, T, W and Monday 03/27/20 tried sennakot but caused blood in stool on 64 ounces water daily and dulcolax  3. CKD 3A   Review of Systems  Constitutional: Negative for weight loss.  HENT: Negative for hearing loss.   Eyes: Negative for blurred vision.  Respiratory: Negative for shortness of breath.   Cardiovascular: Negative for chest pain.  Gastrointestinal: Positive for blood in stool and constipation.  Skin: Negative for rash.  Neurological: Negative for headaches.  Psychiatric/Behavioral: Negative for memory loss.   Past Medical History:  Diagnosis Date  . Anemia   . Diabetes mellitus without complication (HCC)   . Glaucoma    [facility] with macular edema and Dr. Frederico Hamman sees Q3-4 months  . Hypertension    Past Surgical History:  Procedure Laterality Date  . BIOPSY THYROID    . CESAREAN SECTION     1981 1988  . TUBAL LIGATION     Family History  Problem Relation Age of Onset  . Diabetes Mother   . Heart failure Mother   . Diabetes Sister   . Healthy Sister   . Healthy Sister   . Healthy Sister   . Diabetes Brother   . Diabetes Sister   . Heart failure Father   . Brain cancer Brother   . Throat cancer Brother   . Glaucoma Other        Runs on Paternal Side  . Breast cancer Cousin    Social History   Socioeconomic History  . Marital status: Widowed    Spouse name: Not on file  . Number of children: 2  . Years of education: College  . Highest education level: Not on file  Occupational History  . 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 Smoking status: Never Smoker  . Smokeless tobacco: Never Used  Vaping Use  . Vaping Use: Never used  Substance and Sexual Activity  .  Alcohol use: No  . Drug use: No  . Sexual activity: Not Currently    Birth control/protection: None  Other Topics Concern  . Not on file  Social History Narrative   Lives at home    Social Determinants of Health   Financial Resource Strain: Low Risk   . Difficulty of Paying Living Expenses: Not hard at all  Food Insecurity: No Food Insecurity  . Worried About Programme researcher, broadcasting/film/video in the Last Year: Never true  . Ran Out of Food in the Last Year: Never true  Transportation Needs: No Transportation Needs  . Lack of Transportation (Medical): No  . Lack of Transportation (Non-Medical): No  Physical Activity: Inactive  . Days of Exercise per Week: 0 days  . Minutes of Exercise per Session: 0 min  Stress: No Stress Concern Present  . Feeling of Stress : Not at all  Social Connections: Unknown  . Frequency of Communication with Friends and Family: Patient refused  . Frequency of Social Gatherings with Friends and Family: Patient refused  . Attends Religious Services: Patient refused  . Active Member of Clubs or Organizations: Patient refused  . Attends Banker Meetings: Patient refused  . Marital Status: Patient refused  Intimate Partner Violence:  Unknown  . Fear of Current or Ex-Partner: Patient refused  . Emotionally Abused: Patient refused  . Physically Abused: Patient refused  . Sexually Abused: Patient refused   Current Meds  Medication Sig  . amLODipine (NORVASC) 2.5 MG tablet Take by mouth.  Marland Kitchen aspirin EC 81 MG tablet Take 81 mg by mouth daily.  . fluticasone (FLONASE) 50 MCG/ACT nasal spray Place 2 sprays into both nostrils daily.  . furosemide (LASIX) 20 MG tablet Take 1 tablet (20 mg total) by mouth daily as needed for fluid or edema.  Marland Kitchen loratadine (CLARITIN) 10 MG tablet Take 10 mg by mouth daily as needed for allergies.  . magnesium oxide (MAG-OX) 400 MG tablet Take 400 mg by mouth daily.  . metFORMIN (GLUCOPHAGE) 1000 MG tablet Take 1 tablet (1,000 mg  total) by mouth daily.  . Multiple Vitamin (MULTIVITAMIN WITH MINERALS) TABS tablet Take 1 tablet by mouth daily.  . saxagliptin HCl (ONGLYZA) 2.5 MG TABS tablet Take 1 tablet (2.5 mg total) by mouth daily.  . vitamin C (ASCORBIC ACID) 500 MG tablet Take 500 mg by mouth daily.  . [DISCONTINUED] losartan (COZAAR) 25 MG tablet Take 1 tablet (25 mg total) by mouth daily.   Allergies  Allergen Reactions  . Hctz [Hydrochlorothiazide]     Low na 124   . Januvia [Sitagliptin] Diarrhea  . Sulfa Antibiotics   . Losartan Rash   Recent Results (from the past 2160 hour(s))  POCT HgB A1C     Status: Abnormal   Collection Time: 03/28/20 11:51 AM  Result Value Ref Range   Hemoglobin A1C 6.5 (A) 4.0 - 5.6 %   HbA1c POC (<> result, manual entry)     HbA1c, POC (prediabetic range)     HbA1c, POC (controlled diabetic range)    POCT occult blood stool     Status: Normal   Collection Time: 03/28/20  1:14 PM  Result Value Ref Range   Fecal Occult Blood, POC Negative Negative   Card #1 Date     Card #2 Fecal Occult Blod, POC     Card #2 Date     Card #3 Fecal Occult Blood, POC     Card #3 Date     Objective  Body mass index is 32.69 kg/m. Wt Readings from Last 3 Encounters:  03/28/20 173 lb (78.5 kg)  11/24/19 178 lb (80.7 kg)  11/22/19 178 lb (80.7 kg)   Temp Readings from Last 3 Encounters:  03/28/20 (!) 97.5 F (36.4 C) (Oral)  11/24/19 (!) 97.1 F (36.2 C) (Temporal)  08/24/19 (!) 96.6 F (35.9 C) (Temporal)   BP Readings from Last 3 Encounters:  03/28/20 140/90  11/24/19 138/80  11/22/19 (!) 148/79   Pulse Readings from Last 3 Encounters:  03/28/20 86  11/24/19 82  11/22/19 80    Physical Exam Vitals and nursing note reviewed.  Constitutional:      Appearance: Normal appearance. She is well-developed and well-groomed.  HENT:     Head: Normocephalic and atraumatic.  Eyes:     Conjunctiva/sclera: Conjunctivae normal.     Pupils: Pupils are equal, round, and reactive to  light.  Cardiovascular:     Rate and Rhythm: Normal rate and regular rhythm.     Heart sounds: Normal heart sounds. No murmur heard.   Pulmonary:     Effort: Pulmonary effort is normal.     Breath sounds: Normal breath sounds.  Abdominal:     General: Abdomen is flat. Bowel sounds are  normal.  Genitourinary:    Rectum: Guaiac result negative. No external hemorrhoid.  Skin:    General: Skin is warm and dry.  Neurological:     General: No focal deficit present.     Mental Status: She is alert and oriented to person, place, and time.     Gait: Gait normal.  Psychiatric:        Attention and Perception: Attention and perception normal.        Mood and Affect: Mood and affect normal.        Speech: Speech normal.        Behavior: Behavior normal. Behavior is cooperative.        Thought Content: Thought content normal.        Cognition and Memory: Cognition and memory normal.        Judgment: Judgment normal.     Assessment  Plan  Blood in stool - Plan: Ambulatory referral to Gastroenterology, POCT occult blood stool negative today  Controlled type 2 diabetes mellitus without complication, without long-term current use of insulin (HCC) - Plan: POCT HgB A1C 6.5  rec healthy diet and exercise on metformin and onglyza 2.5 A1C inc from 5/6 07/12/19 to 6.5   Stage 3a chronic kidney disease F/u renal  Consider norvasc 2.5 mg bid prn instead of 2.5 mg qd goal BP <130/<80  Constipation, unspecified constipation type  Disc colace f/u GI   HM Flu shot declines prevnar utd  pna 25 utd no need to update due to age per guidelines but could consider Tdap utd  Consider shingrix vaccines in future covid 70 2/2 moderna   Eye MD seen Dr Rosalene Billings, [facility] 04/11/20  Glaucoma suspect   Pap 05/13/19 negative neg HPV  mammo sch 2/1/21neg Colonoscopy had in3/25/2013per pt she was ~61 per chart review was normal though I am unable to review report pt states she had  at [facility] -need to get copy of report due 09/29/21   dexa consider in futurewill try to get pt scheduled Never smoker but exposed to 2nd hand via husband 09/09/19 renal US duplex normal 09/19/15 thyroid US with 10/04/15 benign bx  IMPRESSION: 1. Thyromegaly with bilateral small nodules. Dominant right lesion meets consensus criteria for biopsy. Ultrasound-guided fine needle aspiration should be considered, as per the consensus statement: Management of Thyroid Nodules Detected at [place]: Society of Radiologists in Ultrasound Consensus Conference Statement. Radiology 2005; [phone] Provider: Dr. French Ana [name]-Internal Medicine

## 2020-03-30 DIAGNOSIS — R252 Cramp and spasm: Secondary | ICD-10-CM | POA: Diagnosis not present

## 2020-03-30 DIAGNOSIS — H8113 Benign paroxysmal vertigo, bilateral: Secondary | ICD-10-CM | POA: Diagnosis not present

## 2020-03-30 DIAGNOSIS — H6993 Unspecified Eustachian tube disorder, bilateral: Secondary | ICD-10-CM | POA: Diagnosis not present

## 2020-03-30 DIAGNOSIS — N1831 Chronic kidney disease, stage 3a: Secondary | ICD-10-CM | POA: Diagnosis not present

## 2020-04-03 ENCOUNTER — Telehealth: Payer: Self-pay | Admitting: Internal Medicine

## 2020-04-03 NOTE — Telephone Encounter (Signed)
Request sent via epic routing  

## 2020-04-03 NOTE — Telephone Encounter (Signed)
-----   Message from Bevelyn Buckles, MD sent at 03/28/2020  1:17 PM EDT ----- Need eye records Dr. Lloyd Huger [facility] Dm eye exam thx

## 2020-04-04 ENCOUNTER — Other Ambulatory Visit: Payer: Self-pay

## 2020-04-04 ENCOUNTER — Ambulatory Visit
Admission: EM | Admit: 2020-04-04 | Discharge: 2020-04-04 | Disposition: A | Discharge status: Home / Self Care | Payer: Medicare Other

## 2020-04-04 DIAGNOSIS — M25522 Pain in left elbow: Secondary | ICD-10-CM

## 2020-04-04 DIAGNOSIS — M2550 Pain in unspecified joint: Secondary | ICD-10-CM | POA: Diagnosis not present

## 2020-04-04 DIAGNOSIS — M79605 Pain in left leg: Secondary | ICD-10-CM | POA: Diagnosis not present

## 2020-04-04 NOTE — Discharge Instructions (Signed)
I would recommend that you try Voltaren gel for your pain  Follow up with primary care if symptoms are not resolving with this medication, they may need to change your blood pressure medication.  Follow up with the ER for unrelenting pain, trouble swallowing, trouble breathing, other concerning symptoms

## 2020-04-04 NOTE — ED Provider Notes (Addendum)
[facility]   [id number] 04/04/20 Arrival Time: 1729  [id number] PAIN  SUBJECTIVE: History from: patient. Natasha Phillips is a 69 y.o. female complains of left elbow and arm pain as well as left leg and knee pain. Reports that she takes amlodipine that she just started about a month ago. Denies a precipitating event or specific injury. Patient has history of kidney disease, diabetes so she has not been taking anything for inflammation. Has tried OTC medications without relief. Symptoms seem to be worsened with activity. Denies similar symptoms in the past. Denies fever, chills, erythema, ecchymosis, effusion, weakness, numbness and tingling, saddle paresthesias, loss of bowel or bladder function.      ROS: As per HPI.  All other pertinent ROS negative.     Past Medical History:  Diagnosis Date  . Anemia   . Diabetes mellitus without complication (HCC)   . Glaucoma    [facility] with macular edema and Dr. Frederico Hamman sees Q3-4 months  . Hypertension    Past Surgical History:  Procedure Laterality Date  . BIOPSY THYROID    . CESAREAN SECTION     1981 1988  . TUBAL LIGATION     Allergies  Allergen Reactions  . Hctz [Hydrochlorothiazide]     Low na 124   . Januvia [Sitagliptin] Diarrhea  . Sulfa Antibiotics   . Losartan Rash   No current facility-administered medications on file prior to encounter.   Current Outpatient Medications on File Prior to Encounter  Medication Sig Dispense Refill  . amLODipine (NORVASC) 2.5 MG tablet Take by mouth.    Marland Kitchen aspirin EC 81 MG tablet Take 81 mg by mouth daily.    . fluticasone (FLONASE) 50 MCG/ACT nasal spray Place 2 sprays into both nostrils daily. 16 g 11  . furosemide (LASIX) 20 MG tablet Take 1 tablet (20 mg total) by mouth daily as needed for fluid or edema. 30 tablet 3  . loratadine (CLARITIN) 10 MG tablet Take 10 mg by mouth daily as needed for allergies.    . magnesium oxide (MAG-OX) 400 MG tablet Take 400 mg by mouth daily.     . metFORMIN (GLUCOPHAGE) 1000 MG tablet Take 1 tablet (1,000 mg total) by mouth daily. 90 tablet 4  . Multiple Vitamin (MULTIVITAMIN WITH MINERALS) TABS tablet Take 1 tablet by mouth daily.    . saxagliptin HCl (ONGLYZA) 2.5 MG TABS tablet Take 1 tablet (2.5 mg total) by mouth daily. 90 tablet 3  . vitamin C (ASCORBIC ACID) 500 MG tablet Take 500 mg by mouth daily.    Marland Kitchen triamcinolone cream (KENALOG) 0.1 % Apply 1 application topically 2 (two) times daily. Left lower leg bid prn (Patient not taking: Reported on 03/28/2020) 80 g 0   Social History   Socioeconomic History  . Marital status: Widowed    Spouse name: Not on file  . Number of children: 2  . Years of education: College  . Highest education level: Not on file  Occupational History  . 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 Smoking status: Never Smoker  . Smokeless tobacco: Never Used  Vaping Use  . Vaping Use: Never used  Substance and Sexual Activity  . Alcohol use: No  . Drug use: No  . Sexual activity: Not Currently    Birth control/protection: None  Other Topics Concern  . Not on file  Social History Narrative   Lives at home    Social  Determinants of Health   Financial Resource Strain: Low Risk   . Difficulty of Paying Living Expenses: Not hard at all  Food Insecurity: No Food Insecurity  . Worried About Programme researcher, broadcasting/film/video in the Last Year: Never true  . Ran Out of Food in the Last Year: Never true  Transportation Needs: No Transportation Needs  . Lack of Transportation (Medical): No  . Lack of Transportation (Non-Medical): No  Physical Activity: Inactive  . Days of Exercise per Week: 0 days  . Minutes of Exercise per Session: 0 min  Stress: No Stress Concern Present  . Feeling of Stress : Not at all  Social Connections: Unknown  . Frequency of Communication with Friends and Family: Patient refused  . Frequency of Social Gatherings with Friends and Family:  Patient refused  . Attends Religious Services: Patient refused  . Active Member of Clubs or Organizations: Patient refused  . Attends Banker Meetings: Patient refused  . Marital Status: Patient refused  Intimate Partner Violence: Unknown  . Fear of Current or Ex-Partner: Patient refused  . Emotionally Abused: Patient refused  . Physically Abused: Patient refused  . Sexually Abused: Patient refused   Family History  Problem Relation Age of Onset  . Diabetes Mother   . Heart failure Mother   . Diabetes Sister   . Healthy Sister   . Healthy Sister   . Healthy Sister   . Diabetes Brother   . Diabetes Sister   . Heart failure Father   . Brain cancer Brother   . Throat cancer Brother   . Glaucoma Other        Runs on Paternal Side  . Breast cancer Cousin     OBJECTIVE:  Vitals:   04/04/20 1742  BP: (!) 158/85  Pulse: 84  Resp: 18  Temp: 99.3 F (37.4 C)  TempSrc: Oral  SpO2: 98%    General appearance: ALERT; in no acute distress.  Head: NCAT Lungs: Normal respiratory effort CV:  pulses 2+ bilaterally. Cap refill < 2 seconds Musculoskeletal:  Inspection: Skin warm, dry, clear and intact without obvious erythema, effusion, or ecchymosis.  Palpation: Left leg tender to palpation ROM: ROM active and passive Skin: warm and dry Neurologic: Ambulates without difficulty; Sensation intact about the upper/ lower extremities Psychological: alert and cooperative; normal mood and affect  DIAGNOSTIC STUDIES:  No results found.   ASSESSMENT & PLAN:  1. Arthralgia, unspecified joint   2. Left elbow pain   3. Left leg pain    Discussed that she may use Tylenol for pain Cannot use prednisone due to diabetes and blood sugars running a little bit high for the patient, she does not want test this Cannot tolerate NSAIDs due to kidney disease Discussed that she may get diclofenac gel over-the-counter and apply this to the areas that are very sore If this is not  working, I am thinking that it could be her medication because of the issue Highly suspicious that joint pain is possibly coming from amlodipine use She has been on about 5 different blood pressure medications this year due to reactions Continue conservative management of rest, ice, and gentle stretches Follow up with PCP if symptoms persist Return or go to the ER if you have any new or worsening symptoms  Reviewed expectations re: course of current medical issues. Questions answered. Outlined signs and symptoms indicating need for more acute intervention. Patient verbalized understanding. After Visit Summary given.       Ashley Royalty,  Judeth Cornfield, NP 04/04/20 1853    Moshe Cipro, NP 04/04/20 [phone]

## 2020-04-04 NOTE — ED Triage Notes (Signed)
Pt reports aching in LUE from elbow to hand with decreased grip in L hand.  Started yesterday with difficulty sleeping overnight.  Took Tylenol which eased it a little.    Is now retired but when she did work, she was on [employer] all day.

## 2020-04-05 ENCOUNTER — Telehealth: Payer: Self-pay | Admitting: Internal Medicine

## 2020-04-05 NOTE — Telephone Encounter (Signed)
Patient's daughter called back wanting to know if Dr.Tracy had responded to her message yet

## 2020-04-05 NOTE — Telephone Encounter (Signed)
Patient was seen in urgent care yesterday, note available in chart, for sever pain. Mainly in the left arm and hand then through out the entire body.   Patient's daughter states that UC informed them that this could be due to her BP medication. Patient is currently on brand name Norvasc 2.5 mg. States this is known to cause joint pain and inflammation.    Patient's daughter states that her mother's BP is still elevated and now she is dealing with this pain as well. Wanting to know if Dr French Ana [name] could help coordinate BP medication management.

## 2020-04-05 NOTE — Telephone Encounter (Signed)
Pt daughter called and wanted to go over pt medication list  and talk about her recent visit to [facility]

## 2020-04-07 NOTE — Telephone Encounter (Signed)
Patient's daughter was returning call for discussion on medication for her mother

## 2020-04-10 DIAGNOSIS — N1831 Chronic kidney disease, stage 3a: Secondary | ICD-10-CM | POA: Diagnosis not present

## 2020-04-10 DIAGNOSIS — E1122 Type 2 diabetes mellitus with diabetic chronic kidney disease: Secondary | ICD-10-CM | POA: Diagnosis not present

## 2020-04-10 DIAGNOSIS — I129 Hypertensive chronic kidney disease with stage 1 through stage 4 chronic kidney disease, or unspecified chronic kidney disease: Secondary | ICD-10-CM | POA: Diagnosis not present

## 2020-04-11 NOTE — Telephone Encounter (Signed)
Sent message to pt mychary per Daughter request.

## 2020-04-11 NOTE — Telephone Encounter (Signed)
Call daughter  BP can be elevated in pain  Appears pt saw kidney MD recently and changed her medications  I am not aware norvasc has side effect of joint pain if so risks <1% and she needs some medication to control BP and has not been able to tolerate a lot of common options  main side effect leg swelling norvasc  Goal BP <130/<80 so needs meds to keep her blood pressure at goal and kidney doctor helping with this too

## 2020-04-13 ENCOUNTER — Telehealth: Payer: Self-pay | Admitting: Internal Medicine

## 2020-04-13 ENCOUNTER — Telehealth (INDEPENDENT_AMBULATORY_CARE_PROVIDER_SITE_OTHER): Payer: Self-pay | Admitting: Vascular Surgery

## 2020-04-13 NOTE — Telephone Encounter (Signed)
Patient was made aware with medical advice and has being schedule to come in

## 2020-04-13 NOTE — Telephone Encounter (Signed)
Faxed to [employer] [facility] [place] for Onglyza 2.5 mg or tabs faxed on 04-13-20

## 2020-04-13 NOTE — Telephone Encounter (Signed)
She should see her PCP.  We checked her blood flow and she had excellent blood flow and it is highly unlikely that it has changed.  We can perform ABIs, but if they continue to be normal, she will need to talk to her PCP about discovering possible causes.  She should be scheduled with JD

## 2020-04-13 NOTE — Telephone Encounter (Signed)
Called stating that she is still experiencing leg cramps and numbness in her feet. She is concerned because she is diabetic, and she would like to come in to be seen. The numbness started about 2 weeks ago. Patient says she is elevating her legs and wearing her compression socks. Patient was last seen 11-22-19 as a follow up with FB (leg edema ;  Leg cramsp) Please advise.

## 2020-04-17 ENCOUNTER — Other Ambulatory Visit (INDEPENDENT_AMBULATORY_CARE_PROVIDER_SITE_OTHER): Payer: Self-pay | Admitting: Nurse Practitioner

## 2020-04-17 DIAGNOSIS — M79605 Pain in left leg: Secondary | ICD-10-CM

## 2020-04-17 DIAGNOSIS — M79604 Pain in right leg: Secondary | ICD-10-CM

## 2020-04-17 NOTE — Telephone Encounter (Signed)
Prior authorization has been denied.   Appeal form filled out and sent to fax.

## 2020-04-17 NOTE — Telephone Encounter (Signed)
Faxed request for redetermination of medicare prescription drug denial for onglyza faxed on 04-17-20

## 2020-04-18 ENCOUNTER — Other Ambulatory Visit: Payer: Self-pay

## 2020-04-18 ENCOUNTER — Ambulatory Visit (INDEPENDENT_AMBULATORY_CARE_PROVIDER_SITE_OTHER): Payer: Medicare Other

## 2020-04-18 ENCOUNTER — Encounter (INDEPENDENT_AMBULATORY_CARE_PROVIDER_SITE_OTHER): Payer: Self-pay | Admitting: Vascular Surgery

## 2020-04-18 ENCOUNTER — Ambulatory Visit (INDEPENDENT_AMBULATORY_CARE_PROVIDER_SITE_OTHER): Payer: Medicare Other | Admitting: Nurse Practitioner

## 2020-04-18 ENCOUNTER — Ambulatory Visit (INDEPENDENT_AMBULATORY_CARE_PROVIDER_SITE_OTHER): Payer: Medicare Other | Admitting: Vascular Surgery

## 2020-04-18 VITALS — BP 149/85 | HR 81 | Resp 16 | Ht 61.0 in | Wt 169.0 lb

## 2020-04-18 DIAGNOSIS — M79605 Pain in left leg: Secondary | ICD-10-CM

## 2020-04-18 DIAGNOSIS — E119 Type 2 diabetes mellitus without complications: Secondary | ICD-10-CM

## 2020-04-18 DIAGNOSIS — M79604 Pain in right leg: Secondary | ICD-10-CM | POA: Diagnosis not present

## 2020-04-18 DIAGNOSIS — I10 Essential (primary) hypertension: Secondary | ICD-10-CM | POA: Diagnosis not present

## 2020-04-18 DIAGNOSIS — N182 Chronic kidney disease, stage 2 (mild): Secondary | ICD-10-CM

## 2020-04-18 NOTE — Progress Notes (Signed)
MRN : [id number]  Natasha Phillips is a 69 y.o. (03-22-1951) female who presents with chief complaint of  Chief Complaint  Patient presents with  . Follow-up    ultrasound  .  History of Present Illness: Patient returns today in follow up on the request of her primary care physician for worsening left leg pain.  Her left leg has gone numb and being quite painful.  This started in the hip and thigh area and radiated down her leg.  Her foot felt asleep and for several days was very painful.  She says the pain is actually better but the numbness persist.  She had previously been worked up for arterial and venous disease in the past that were somewhat unrevealing, but with the acute nature of the pain her primary care physician requested [place] to recheck her arterial perfusion.  Her ABIs today were normal at 1.0 bilaterally with triphasic waveforms and normal digital pressures consistent with no arterial insufficiency  Current Outpatient Medications  Medication Sig Dispense Refill  . aspirin EC 81 MG tablet Take 81 mg by mouth daily.    . fluticasone (FLONASE) 50 MCG/ACT nasal spray Place 2 sprays into both nostrils daily. 16 g 11  . furosemide (LASIX) 20 MG tablet Take 1 tablet (20 mg total) by mouth daily as needed for fluid or edema. 30 tablet 3  . loratadine (CLARITIN) 10 MG tablet Take 10 mg by mouth daily as needed for allergies.    . magnesium oxide (MAG-OX) 400 MG tablet Take 400 mg by mouth daily.    . metFORMIN (GLUCOPHAGE) 1000 MG tablet Take 1 tablet (1,000 mg total) by mouth daily. 90 tablet 4  . Multiple Vitamin (MULTIVITAMIN WITH MINERALS) TABS tablet Take 1 tablet by mouth daily.    . saxagliptin HCl (ONGLYZA) 2.5 MG TABS tablet Take 1 tablet (2.5 mg total) by mouth daily. 90 tablet 3  . vitamin C (ASCORBIC ACID) 500 MG tablet Take 500 mg by mouth daily.    Marland Kitchen amLODipine (NORVASC) 2.5 MG tablet Take by mouth. (Patient not taking: Reported on 04/18/2020)    . olmesartan (BENICAR) 20  MG tablet Take 20 mg by mouth daily.    Marland Kitchen triamcinolone cream (KENALOG) 0.1 % Apply 1 application topically 2 (two) times daily. Left lower leg bid prn (Patient not taking: Reported on 03/28/2020) 80 g 0   No current facility-administered medications for this visit.    Past Medical History:  Diagnosis Date  . Anemia   . Diabetes mellitus without complication (HCC)   . Glaucoma    [facility] with macular edema and Dr. Frederico Hamman sees Q3-4 months  . Hypertension     Past Surgical History:  Procedure Laterality Date  . BIOPSY THYROID    . CESAREAN SECTION     1981 1988  . TUBAL LIGATION       Social History   Tobacco Use  . Smoking status: Never Smoker  . Smokeless tobacco: Never Used  Vaping Use  . Vaping Use: Never used  Substance Use Topics  . Alcohol use: No  . Drug use: No      Family History  Problem Relation Age of Onset  . Diabetes Mother   . Heart failure Mother   . Diabetes Sister   . Healthy Sister   . Healthy Sister   . Healthy Sister   . Diabetes Brother   . Diabetes Sister   . Heart failure Father   . Brain cancer  Brother   . Throat cancer Brother   . Glaucoma Other        Runs on Paternal Side  . Breast cancer Cousin      Allergies  Allergen Reactions  . Hctz [Hydrochlorothiazide]     Low na 124   . Januvia [Sitagliptin] Diarrhea  . Sulfa Antibiotics   . Losartan Rash     REVIEW OF SYSTEMS (Negative unless checked)  Constitutional: [] ?Weight loss  [] ?Fever  [] ?Chills Cardiac: [] ?Chest pain   [] ?Chest pressure   [] ?Palpitations   [] ?Shortness of breath when laying flat   [] ?Shortness of breath at rest   [] ?Shortness of breath with exertion. Vascular:  [x] ?Pain in legs with walking   [x] ?Pain in legs at rest   [] ?Pain in legs when laying flat   [] ?Claudication   [] ?Pain in feet when walking  [] ?Pain in feet at rest  [] ?Pain in feet when laying flat   [] ?History of DVT   [] ?Phlebitis   [] ?Swelling in legs   [] ?Varicose veins    [] ?Non-healing ulcers Pulmonary:   [] ?Uses home oxygen   [] ?Productive cough   [] ?Hemoptysis   [] ?Wheeze  [] ?COPD   [] ?Asthma Neurologic:  [] ?Dizziness  [] ?Blackouts   [] ?Seizures   [] ?History of stroke   [] ?History of TIA  [] ?Aphasia   [] ?Temporary blindness   [] ?Dysphagia   [] ?Weakness or numbness in arms   [] ?Weakness or numbness in legs Musculoskeletal:  [] ?Arthritis   [] ?Joint swelling   [] ?Joint pain   [] ?Low back pain Hematologic:  [] ?Easy bruising  [] ?Easy bleeding   [] ?Hypercoagulable state   [] ?Anemic  [] ?Hepatitis Gastrointestinal:  [] ?Blood in stool   [] ?Vomiting blood  [] ?Gastroesophageal reflux/heartburn   [] ?Abdominal pain Genitourinary:  [] ?Chronic kidney disease   [] ?Difficult urination  [] ?Frequent urination  [] ?Burning with urination   [] ?Hematuria Skin:  [] ?Rashes   [] ?Ulcers   [] ?Wounds Psychological:  [] ?History of anxiety   [] ? History of major depression.  Physical Examination  BP (!) 149/85 (BP Location: Right Arm)   Pulse 81   Resp 16   Ht 5\' 1"  (1.549 m)   Wt 169 lb (76.7 kg)   BMI 31.93 kg/m  Gen:  WD/WN, NAD.  Appears younger than stated age Head: [place]/AT, No temporalis wasting. Ear/Nose/Throat: Hearing grossly intact, nares w/o erythema or drainage Eyes: Conjunctiva clear. Sclera non-icteric Neck: Supple.  Trachea midline Pulmonary:  Good air movement, no use of accessory muscles.  Cardiac: RRR, no JVD Vascular:  Vessel Right Left  Radial Palpable Palpable                          PT Palpable Palpable  DP Palpable Palpable   Gastrointestinal: soft, non-tender/non-distended. No guarding/reflex.  Musculoskeletal: M/S 5/5 throughout.  No deformity or atrophy.  No edema. Neurologic: Sensation grossly intact in extremities.  Symmetrical.  Speech is fluent.  Psychiatric: Judgment intact, Mood & affect appropriate for pt's clinical situation. Dermatologic: No rashes or ulcers noted.  No cellulitis or open wounds.       Labs Recent Results (from  the past 2160 hour(s))  POCT HgB A1C     Status: Abnormal   Collection Time: 03/28/20 11:51 AM  Result Value Ref Range   Hemoglobin A1C 6.5 (A) 4.0 - 5.6 %   HbA1c POC (<> result, manual entry)     HbA1c, POC (prediabetic range)     HbA1c, POC (controlled diabetic range)    POCT occult blood stool     Status: Normal  Collection Time: 03/28/20  1:14 PM  Result Value Ref Range   Fecal Occult Blood, POC Negative Negative   Card #1 Date     Card #2 Fecal Occult Blod, POC     Card #2 Date     Card #3 Fecal Occult Blood, POC     Card #3 Date      Radiology No results found.  Assessment/Plan Essential hypertension blood pressure control important in reducing the progression of atherosclerotic disease. On appropriate oral medications.   Controlled type 2 diabetes mellitus without complication (HCC) blood glucose control important in reducing the progression of atherosclerotic disease. Also, involved in wound healing. On appropriate medications.   CKD (chronic kidney disease) stage 2, GFR 60-89 ml/min Start evaluation with noninvasive studies to avoid contrast which could potentially harm the kidneys.  Pain in limb Her ABIs today were normal at 1.0 bilaterally with triphasic waveforms and normal digital pressures consistent with no arterial insufficiency It does not appear as if her lower extremity pain is related to arterial insufficiency or significant vascular disease at this time.  I would suspect a neurogenic source given the characteristics of the pain.  I will see the patient back on an as-needed basis.    Festus Barren, MD  04/18/2020 1:22 PM    This note was created with Dragon medical transcription system.  Any errors from dictation are purely unintentional

## 2020-04-18 NOTE — Assessment & Plan Note (Signed)
Her ABIs today were normal at 1.0 bilaterally with triphasic waveforms and normal digital pressures consistent with no arterial insufficiency It does not appear as if her lower extremity pain is related to arterial insufficiency or significant vascular disease at this time.  I would suspect a neurogenic source given the characteristics of the pain.  I will see the patient back on an as-needed basis.

## 2020-04-18 NOTE — Telephone Encounter (Signed)
Nicky from [facility] called and stated that the appeal would a 7 day turn around for patient.

## 2020-04-18 NOTE — Telephone Encounter (Signed)
Noted  

## 2020-04-19 NOTE — Telephone Encounter (Signed)
[employer] called and said that Onglyza 2.5mg  was still denied

## 2020-04-19 NOTE — Telephone Encounter (Signed)
Pt needs to call and see which similar medication insurance prefers

## 2020-04-19 NOTE — Telephone Encounter (Signed)
Left message to return call 

## 2020-04-19 NOTE — Telephone Encounter (Signed)
Spoke with the patient she states the medication is partially covered. States she does not mind paying the extra out of pocket until her insurance re-sets in January. She does not want to switch to a lower tier medication.

## 2020-04-19 NOTE — Telephone Encounter (Signed)
Please advise 

## 2020-04-19 NOTE — Telephone Encounter (Signed)
Pt returned your call.  

## 2020-04-21 ENCOUNTER — Ambulatory Visit (INDEPENDENT_AMBULATORY_CARE_PROVIDER_SITE_OTHER): Payer: Medicare Other | Admitting: Vascular Surgery

## 2020-04-24 ENCOUNTER — Telehealth: Payer: Self-pay | Admitting: Internal Medicine

## 2020-04-24 NOTE — Telephone Encounter (Signed)
Faxed request for new statin prescription faxed on 04-24-20

## 2020-04-25 DIAGNOSIS — E669 Obesity, unspecified: Secondary | ICD-10-CM | POA: Diagnosis not present

## 2020-04-25 DIAGNOSIS — R600 Localized edema: Secondary | ICD-10-CM | POA: Diagnosis not present

## 2020-04-25 DIAGNOSIS — R002 Palpitations: Secondary | ICD-10-CM | POA: Diagnosis not present

## 2020-04-25 DIAGNOSIS — I10 Essential (primary) hypertension: Secondary | ICD-10-CM | POA: Diagnosis not present

## 2020-04-25 DIAGNOSIS — R0600 Dyspnea, unspecified: Secondary | ICD-10-CM | POA: Diagnosis not present

## 2020-04-25 DIAGNOSIS — E119 Type 2 diabetes mellitus without complications: Secondary | ICD-10-CM | POA: Diagnosis not present

## 2020-04-25 DIAGNOSIS — N1831 Chronic kidney disease, stage 3a: Secondary | ICD-10-CM | POA: Diagnosis not present

## 2020-04-25 DIAGNOSIS — R42 Dizziness and giddiness: Secondary | ICD-10-CM | POA: Diagnosis not present

## 2020-04-28 ENCOUNTER — Other Ambulatory Visit: Payer: Self-pay | Admitting: Internal Medicine

## 2020-05-15 ENCOUNTER — Ambulatory Visit: Payer: Medicare Other | Admitting: Obstetrics and Gynecology

## 2020-05-16 DIAGNOSIS — I129 Hypertensive chronic kidney disease with stage 1 through stage 4 chronic kidney disease, or unspecified chronic kidney disease: Secondary | ICD-10-CM | POA: Diagnosis not present

## 2020-05-16 DIAGNOSIS — N1831 Chronic kidney disease, stage 3a: Secondary | ICD-10-CM | POA: Diagnosis not present

## 2020-05-16 DIAGNOSIS — E1122 Type 2 diabetes mellitus with diabetic chronic kidney disease: Secondary | ICD-10-CM | POA: Diagnosis not present

## 2020-05-17 ENCOUNTER — Other Ambulatory Visit: Payer: Self-pay

## 2020-05-17 ENCOUNTER — Ambulatory Visit (INDEPENDENT_AMBULATORY_CARE_PROVIDER_SITE_OTHER): Payer: Medicare Other

## 2020-05-17 ENCOUNTER — Encounter: Payer: Self-pay | Admitting: Internal Medicine

## 2020-05-17 ENCOUNTER — Ambulatory Visit (INDEPENDENT_AMBULATORY_CARE_PROVIDER_SITE_OTHER): Payer: Medicare Other | Admitting: Internal Medicine

## 2020-05-17 VITALS — BP 146/88 | HR 83 | Temp 97.8°F | Ht 61.0 in | Wt 168.0 lb

## 2020-05-17 DIAGNOSIS — E1159 Type 2 diabetes mellitus with other circulatory complications: Secondary | ICD-10-CM | POA: Diagnosis not present

## 2020-05-17 DIAGNOSIS — M47816 Spondylosis without myelopathy or radiculopathy, lumbar region: Secondary | ICD-10-CM

## 2020-05-17 DIAGNOSIS — M419 Scoliosis, unspecified: Secondary | ICD-10-CM

## 2020-05-17 DIAGNOSIS — I10 Essential (primary) hypertension: Secondary | ICD-10-CM | POA: Diagnosis not present

## 2020-05-17 DIAGNOSIS — R109 Unspecified abdominal pain: Secondary | ICD-10-CM

## 2020-05-17 DIAGNOSIS — I152 Hypertension secondary to endocrine disorders: Secondary | ICD-10-CM

## 2020-05-17 DIAGNOSIS — K649 Unspecified hemorrhoids: Secondary | ICD-10-CM | POA: Diagnosis not present

## 2020-05-17 DIAGNOSIS — E119 Type 2 diabetes mellitus without complications: Secondary | ICD-10-CM

## 2020-05-17 DIAGNOSIS — R0781 Pleurodynia: Secondary | ICD-10-CM | POA: Diagnosis not present

## 2020-05-17 DIAGNOSIS — M479 Spondylosis, unspecified: Secondary | ICD-10-CM | POA: Diagnosis not present

## 2020-05-17 MED ORDER — ONGLYZA 2.5 MG PO TABS: 2.5000 mg | ORAL_TABLET | Freq: Every day | ORAL | 3 refills | Status: DC

## 2020-05-17 MED ORDER — HYDROCORTISONE (PERIANAL) 2.5 % EX CREA: 1.0000 "application " | TOPICAL_CREAM | Freq: Three times a day (TID) | CUTANEOUS | 11 refills | Status: DC

## 2020-05-17 MED ORDER — TRAMADOL HCL 50 MG PO TABS: 25.0000 mg | ORAL_TABLET | Freq: Two times a day (BID) | ORAL | 0 refills | Status: AC | PRN

## 2020-05-17 NOTE — Patient Instructions (Addendum)
Tylenol and heating  Voltaren gel   Salonpas pain patch or lidocaine   [facility] [phone] call today please   Colace as needed constipation with miralax over the counter    Low Back Sprain or Strain Rehab Ask your health care provider which exercises are safe for you. Do exercises exactly as told by your health care provider and adjust them as directed. It is normal to feel mild stretching, pulling, tightness, or discomfort as you do these exercises. Stop right away if you feel sudden pain or your pain gets worse. Do not begin these exercises until told by your health care provider. Stretching and range-of-motion exercises These exercises warm up your muscles and joints and improve the movement and flexibility of your back. These exercises also help to relieve pain, numbness, and tingling. Lumbar rotation  1. Lie on your back on a firm surface and bend your knees. 2. Straighten your arms out to your sides so each arm forms a 90-degree angle (right angle) with a side of your body. 3. Slowly move (rotate) both of your knees to one side of your body until you feel a stretch in your lower back (lumbar). Try not to let your shoulders lift off the floor. 4. Hold this position for __________ seconds. 5. Tense your abdominal muscles and slowly move your knees back to the starting position. 6. Repeat this exercise on the other side of your body. Repeat __________ times. Complete this exercise __________ times a day. Single knee to chest  1. Lie on your back on a firm surface with both legs straight. 2. Bend one of your knees. Use your hands to move your knee up toward your chest until you feel a gentle stretch in your lower back and buttock. ? Hold your leg in this position by holding on to the front of your knee. ? Keep your other leg as straight as possible. 3. Hold this position for __________ seconds. 4. Slowly return to the starting position. 5. Repeat with your other leg. Repeat  __________ times. Complete this exercise __________ times a day. Prone extension on elbows  1. Lie on your abdomen on a firm surface (prone position). 2. Prop yourself up on your elbows. 3. Use your arms to help lift your chest up until you feel a gentle stretch in your abdomen and your lower back. ? This will place some of your body weight on your elbows. If this is uncomfortable, try stacking pillows under your chest. ? Your hips should stay down, against the surface that you are lying on. Keep your hip and back muscles relaxed. 4. Hold this position for __________ seconds. 5. Slowly relax your upper body and return to the starting position. Repeat __________ times. Complete this exercise __________ times a day. Strengthening exercises These exercises build strength and endurance in your back. Endurance is the ability to use your muscles for a long time, even after they get tired. Pelvic tilt This exercise strengthens the muscles that lie deep in the abdomen. 1. Lie on your back on a firm surface. Bend your knees and keep your feet flat on the floor. 2. Tense your abdominal muscles. Tip your pelvis up toward the ceiling and flatten your lower back into the floor. ? To help with this exercise, you may place a small towel under your lower back and try to push your back into the towel. 3. Hold this position for __________ seconds. 4. Let your muscles relax completely before you repeat this  exercise. Repeat __________ times. Complete this exercise __________ times a day. Alternating arm and leg raises  1. Get on your hands and knees on a firm surface. If you are on a hard floor, you may want to use padding, such as an exercise mat, to cushion your knees. 2. Line up your arms and legs. Your hands should be directly below your shoulders, and your knees should be directly below your hips. 3. Lift your left leg behind you. At the same time, raise your right arm and straighten it in front of  you. ? Do not lift your leg higher than your hip. ? Do not lift your arm higher than your shoulder. ? Keep your abdominal and back muscles tight. ? Keep your hips facing the ground. ? Do not arch your back. ? Keep your balance carefully, and do not hold your breath. 4. Hold this position for __________ seconds. 5. Slowly return to the starting position. 6. Repeat with your right leg and your left arm. Repeat __________ times. Complete this exercise __________ times a day. Abdominal set with straight leg raise  1. Lie on your back on a firm surface. 2. Bend one of your knees and keep your other leg straight. 3. Tense your abdominal muscles and lift your straight leg up, 4-6 inches (10-15 cm) off the ground. 4. Keep your abdominal muscles tight and hold this position for __________ seconds. ? Do not hold your breath. ? Do not arch your back. Keep it flat against the ground. 5. Keep your abdominal muscles tense as you slowly lower your leg back to the starting position. 6. Repeat with your other leg. Repeat __________ times. Complete this exercise __________ times a day. Single leg lower with bent knees 1. Lie on your back on a firm surface. 2. Tense your abdominal muscles and lift your feet off the floor, one foot at a time, so your knees and hips are bent in 90-degree angles (right angles). ? Your knees should be over your hips and your lower legs should be parallel to the floor. 3. Keeping your abdominal muscles tense and your knee bent, slowly lower one of your legs so your toe touches the ground. 4. Lift your leg back up to return to the starting position. ? Do not hold your breath. ? Do not let your back arch. Keep your back flat against the ground. 5. Repeat with your other leg. Repeat __________ times. Complete this exercise __________ times a day. Posture and body mechanics Good posture and healthy body mechanics can help to relieve stress in your body's tissues and joints.  Body mechanics refers to the movements and positions of your body while you do your daily activities. Posture is part of body mechanics. Good posture means:  Your spine is in its natural S-curve position (neutral).  Your shoulders are pulled back slightly.  Your head is not tipped forward. Follow these guidelines to improve your posture and body mechanics in your everyday activities. Standing   When standing, keep your spine neutral and your feet about hip width apart. Keep a slight bend in your knees. Your ears, shoulders, and hips should line up.  When you do a task in which you stand in one place for a long time, place one foot up on a stable object that is 2-4 inches (5-10 cm) high, such as a footstool. This helps keep your spine neutral. Sitting   When sitting, keep your spine neutral and keep your feet flat on the  floor. Use a footrest, if necessary, and keep your thighs parallel to the floor. Avoid rounding your shoulders, and avoid tilting your head forward.  When working at a desk or a computer, keep your desk at a height where your hands are slightly lower than your elbows. Slide your chair under your desk so you are close enough to maintain good posture.  When working at a computer, place your monitor at a height where you are looking straight ahead and you do not have to tilt your head forward or downward to look at the screen. Resting  When lying down and resting, avoid positions that are most painful for you.  If you have pain with activities such as sitting, bending, stooping, or squatting, lie in a position in which your body does not bend very much. For example, avoid curling up on your side with your arms and knees near your chest (fetal position).  If you have pain with activities such as standing for a long time or reaching with your arms, lie with your spine in a neutral position and bend your knees slightly. Try the following positions: ? Lying on your side with a  pillow between your knees. ? Lying on your back with a pillow under your knees. Lifting   When lifting objects, keep your feet at least shoulder width apart and tighten your abdominal muscles.  Bend your knees and hips and keep your spine neutral. It is important to lift using the strength of your legs, not your back. Do not lock your knees straight out.  Always ask for help to lift heavy or awkward objects. This information is not intended to replace advice given to you by your health care provider. Make sure you discuss any questions you have with your health care provider. Document Revised: 10/16/2018 Document Reviewed: 07/16/2018 [employer] Patient Education  2020 [employer]  Thoracic Strain Rehab Ask your health care provider which exercises are safe for you. Do exercises exactly as told by your health care provider and adjust them as directed. It is normal to feel mild stretching, pulling, tightness, or discomfort as you do these exercises. Stop right away if you feel sudden pain or your pain gets worse. Do not begin these exercises until told by your health care provider. Stretching and range-of-motion exercise This exercise warms up your muscles and joints and improves the movement and flexibility of your back and shoulders. This exercise also helps to relieve pain. Chest and spine stretch  7. Lie down on your back on a firm surface. 8. Roll a towel or a small blanket so it is about 4 inches (10 cm) in diameter. 9. Put the towel lengthwise under the middle of your back so it is under your spine, but not under your shoulder blades. 10. Put your hands behind your head and let your elbows fall to your sides. This will increase your stretch. 11. Take a deep breath (inhale). 12. Hold for __________ seconds. 13. Relax after you breathe out (exhale). Repeat __________ times. Complete this exercise __________ times a day. Strengthening exercises These exercises build strength and  endurance in your back and your shoulder blade muscles. Endurance is the ability to use your muscles for a long time, even after they get tired. Alternating arm and leg raises  6. Get on your hands and knees on a firm surface. If you are on a hard floor, you may want to use padding, such as an exercise mat, to cushion your knees. 7.  Line up your arms and legs. Your hands should be directly below your shoulders, and your knees should be directly below your hips. 8. Lift your left leg behind you. At the same time, raise your right arm and straighten it in front of you. ? Do not lift your leg higher than your hip. ? Do not lift your arm higher than your shoulder. ? Keep your abdominal and back muscles tight. ? Keep your hips facing the ground. ? Do not arch your back. ? Keep your balance carefully, and do not hold your breath. 9. Hold for __________ seconds. 10. Slowly return to the starting position and repeat with your right leg and your left arm. Repeat __________ times. Complete this exercise __________ times a day. Straight arm rows This exercise is also called shoulder extension exercise. 6. Stand with your feet shoulder width apart. 7. Secure an exercise band to a stable object in front of you so the band is at or above shoulder height. 8. Hold one end of the exercise band in each hand. 9. Straighten your elbows and lift your hands up to shoulder height. 10. Step back, away from the secured end of the exercise band, until the band stretches. 11. Squeeze your shoulder blades together and pull your hands down to the sides of your thighs. Stop when your hands are straight down by your sides. This is shoulder extension. Do not let your hands go behind your body. 12. Hold for __________ seconds. 13. Slowly return to the starting position. Repeat __________ times. Complete this exercise __________ times a day. Prone shoulder external rotation 5. Lie on your abdomen on a firm bed so your left  / right forearm hangs over the edge of the bed and your upper arm is on the bed, straight out from your body. This is the prone position. ? Your elbow should be bent. ? Your palm should be facing your feet. 6. If instructed, hold a __________ weight in your hand. 7. Squeeze your shoulder blade toward the middle of your back. Do not let your shoulder lift toward your ear. 8. Keep your elbow bent in a 90-degree angle (right angle) while you slowly move your forearm up toward the ceiling. Move your forearm up to the height of the bed, toward your head. This is external rotation. ? Your upper arm should not move. ? At the top of the movement, your palm should face the floor. 9. Hold for __________ seconds. 10. Slowly return to the starting position and relax your muscles. Repeat __________ times. Complete this exercise __________ times a day. Rowing scapular retraction This is an exercise in which the shoulder blades (scapulae) are pulled toward each other (retraction). 7. Sit in a stable chair without armrests, or stand up. 8. Secure an exercise band to a stable object in front of you so the band is at shoulder height. 9. Hold one end of the exercise band in each hand. Your palms should face down. 10. Bring your arms out straight in front of you. 11. Step back, away from the secured end of the exercise band, until the band stretches. 12. Pull the band backward. As you do this, bend your elbows and squeeze your shoulder blades together, but avoid letting the rest of your body move. Do not shrug your shoulders upward while you do this. 13. Stop when your elbows are at your sides or slightly behind your body. 14. Hold for __________ seconds. 15. Slowly straighten your arms to return to  the starting position. Repeat __________ times. Complete this exercise __________ times a day. Posture and body mechanics Good posture and healthy body mechanics can help to relieve stress in your body's tissues and  joints. Body mechanics refers to the movements and positions of your body while you do your daily activities. Posture is part of body mechanics. Good posture means:  Your spine is in its natural S-curve position (neutral).  Your shoulders are pulled back slightly.  Your head is not tipped forward. Follow these guidelines to improve your posture and body mechanics in your everyday activities. Standing   When standing, keep your spine neutral and your feet about hip width apart. Keep a slight bend in your knees. Your ears, shoulders, and hips should line up with each other.  When you do a task in which you lean forward while standing in one place for a long time, place one foot up on a stable object that is 2-4 inches (5-10 cm) high, such as a footstool. This helps keep your spine neutral. Sitting   When sitting, keep your spine neutral and keep your feet flat on the floor. Use a footrest, if necessary, and keep your thighs parallel to the floor. Avoid rounding your shoulders, and avoid tilting your head forward.  When working at a desk or a computer, keep your desk at a height where your hands are slightly lower than your elbows. Slide your chair under your desk so you are close enough to maintain good posture.  When working at a computer, place your monitor at a height where you are looking straight ahead and you do not have to tilt your head forward or downward to look at the screen. Resting When lying down and resting, avoid positions that are most painful for you.  If you have pain with activities such as sitting, bending, stooping, or squatting (flexion-basedactivities), lie in a position in which your body does not bend very much. For example, avoid curling up on your side with your arms and knees near your chest (fetal position).  If you have pain with activities such as standing for a long time or reaching with your arms (extension-basedactivities), lie with your spine in a neutral  position and bend your knees slightly. Try the following positions: ? Lie on your side with a pillow between your knees. ? Lie on your back with a pillow under your knees.  Lifting   When lifting objects, keep your feet at least shoulder width apart and tighten your abdominal muscles.  Bend your knees and hips and keep your spine neutral. It is important to lift using the strength of your legs, not your back. Do not lock your knees straight out.  Always ask for help to lift heavy or awkward objects. This information is not intended to replace advice given to you by your health care provider. Make sure you discuss any questions you have with your health care provider. Document Revised: 10/16/2018 Document Reviewed: 08/03/2018 [employer] Patient Education  2020 [employer].

## 2020-05-17 NOTE — Progress Notes (Signed)
Chief Complaint  Patient presents with  . Follow-up  . Hemorrhoids  . Chest Pain   F/u  1. HTN stopped norvasc 2.5 benicar 5 mg due to side effects and she takes qhs. BP elevated today has not had meds 2. DM 2 on onglyza 2.5 mg qd  3. Rib cage pain 8/10 at times 3-4/10 right >left and mid back pain x 2 weeks worse in the am and when bending over COMPARISON:  None.  FINDINGS: Minimal dextrocurvature of the spine. Sagittal alignment is within normal limits. Bulky anterior osteophytes of the mid to lower thoracic spine.  IMPRESSION: Minimal scoliosis and moderate osteophytosis of the mid to lower spine. No acute osseous abnormality   On: 11/24/2019 23:06 FINDINGS: Frontal, bilateral oblique, lateral views of the lumbar spine are obtained. There are 5 non-rib-bearing lumbar type vertebral bodies identified. Minimal grade 1 anterolisthesis of L4 on L5 is noted. There are no pars defects.  Disc spaces are relatively well preserved. There is multilevel facet hypertrophy greatest at L4-5 and L5-S1. Sacroiliac joints are normal.  IMPRESSION: 1. Lower lumbar facet hypertrophy. 2. Minimal grade 1 anterolisthesis of L4 on L5. 3. No acute bony abnormality.    On: 11/24/2019 23:08  4. Hemorrhoids with blood in stool and needs to f/u GI has not seen them but they have been trying to reach out [place]  GI referral placed 03/2020   Review of Systems  Constitutional: Negative for weight loss.  HENT: Negative for hearing loss.   Eyes: Negative for blurred vision.  Respiratory: Negative for shortness of breath.   Cardiovascular: Negative for chest pain.  Gastrointestinal: Positive for blood in stool.  Musculoskeletal: Positive for back pain.  Skin: Negative for rash.  Neurological: Negative for headaches.  Psychiatric/Behavioral: Negative for depression.   Past Medical History:  Diagnosis Date  . Anemia   . Diabetes mellitus without complication (HCC)   . Glaucoma    [facility]  with macular edema and Dr. Frederico Hamman sees Q3-4 months  . Hypertension    Past Surgical History:  Procedure Laterality Date  . BIOPSY THYROID    . CESAREAN SECTION     1981 1988  . TUBAL LIGATION     Family History  Problem Relation Age of Onset  . Diabetes Mother   . Heart failure Mother   . Diabetes Sister   . Healthy Sister   . Healthy Sister   . Healthy Sister   . Diabetes Brother   . Diabetes Sister   . Heart failure Father   . Brain cancer Brother   . Throat cancer Brother   . Glaucoma Other        Runs on Paternal Side  . Breast cancer Cousin    Social History   Socioeconomic History  . Marital status: Widowed    Spouse name: Not on file  . Number of children: 2  . Years of education: College  . Highest education level: Not on file  Occupational History  . 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 Smoking status: Never Smoker  . Smokeless tobacco: Never Used  Vaping Use  . Vaping Use: Never used  Substance and Sexual Activity  . Alcohol use: No  . Drug use: No  . Sexual activity: Not Currently    Birth control/protection: None  Other Topics Concern  . Not on file  Social History Narrative   Lives at home    Social Determinants of  Health   Financial Resource Strain: Low Risk   . Difficulty of Paying Living Expenses: Not hard at all  Food Insecurity: No Food Insecurity  . Worried About Programme researcher, broadcasting/film/video in the Last Year: Never true  . Ran Out of Food in the Last Year: Never true  Transportation Needs: No Transportation Needs  . Lack of Transportation (Medical): No  . Lack of Transportation (Non-Medical): No  Physical Activity: Inactive  . Days of Exercise per Week: 0 days  . Minutes of Exercise per Session: 0 min  Stress: No Stress Concern Present  . Feeling of Stress : Not at all  Social Connections: Unknown  . Frequency of Communication with Friends and Family: Patient refused  . Frequency of  Social Gatherings with Friends and Family: Patient refused  . Attends Religious Services: Patient refused  . Active Member of Clubs or Organizations: Patient refused  . Attends Banker Meetings: Patient refused  . Marital Status: Patient refused  Intimate Partner Violence: Unknown  . Fear of Current or Ex-Partner: Patient refused  . Emotionally Abused: Patient refused  . Physically Abused: Patient refused  . Sexually Abused: Patient refused   Current Meds  Medication Sig  . aspirin EC 81 MG tablet Take 81 mg by mouth daily.  . fluticasone (FLONASE) 50 MCG/ACT nasal spray Place 2 sprays into both nostrils daily.  . furosemide (LASIX) 20 MG tablet Take 1 tablet (20 mg total) by mouth daily as needed for fluid or edema.  Marland Kitchen loratadine (CLARITIN) 10 MG tablet Take 10 mg by mouth daily as needed for allergies.  . magnesium oxide (MAG-OX) 400 MG tablet Take 400 mg by mouth daily.  . metFORMIN (GLUCOPHAGE) 1000 MG tablet Take 1 tablet (1,000 mg total) by mouth daily.  . Multiple Vitamin (MULTIVITAMIN WITH MINERALS) TABS tablet Take 1 tablet by mouth daily.  Marland Kitchen olmesartan (BENICAR) 5 MG tablet Take by mouth.  . ONGLYZA 2.5 MG TABS tablet Take 1 tablet (2.5 mg total) by mouth daily. januvia caused diarrhea  . triamcinolone cream (KENALOG) 0.1 % Apply 1 application topically 2 (two) times daily. Left lower leg bid prn  . vitamin C (ASCORBIC ACID) 500 MG tablet Take 500 mg by mouth daily.  . [DISCONTINUED] ONGLYZA 2.5 MG TABS tablet Take 2.5 mg by mouth daily.   Allergies  Allergen Reactions  . Hctz [Hydrochlorothiazide]     Low na 124   . Januvia [Sitagliptin] Diarrhea  . Sulfa Antibiotics   . Losartan Rash   Recent Results (from the past 2160 hour(s))  POCT HgB A1C     Status: Abnormal   Collection Time: 03/28/20 11:51 AM  Result Value Ref Range   Hemoglobin A1C 6.5 (A) 4.0 - 5.6 %   HbA1c POC (<> result, manual entry)     HbA1c, POC (prediabetic range)     HbA1c, POC  (controlled diabetic range)    POCT occult blood stool     Status: Normal   Collection Time: 03/28/20  1:14 PM  Result Value Ref Range   Fecal Occult Blood, POC Negative Negative   Card #1 Date     Card #2 Fecal Occult Blod, POC     Card #2 Date     Card #3 Fecal Occult Blood, POC     Card #3 Date     Objective  Body mass index is 31.74 kg/m. Wt Readings from Last 3 Encounters:  05/17/20 168 lb (76.2 kg)  04/18/20 169 lb (76.7 kg)  03/28/20 173 lb (78.5 kg)   Temp Readings from Last 3 Encounters:  05/17/20 97.8 F (36.6 C) (Oral)  04/04/20 99.3 F (37.4 C) (Oral)  03/28/20 (!) 97.5 F (36.4 C) (Oral)   BP Readings from Last 3 Encounters:  05/17/20 (!) 146/88  04/18/20 (!) 149/85  04/04/20 (!) 158/85   Pulse Readings from Last 3 Encounters:  05/17/20 83  04/18/20 81  04/04/20 84    Physical Exam Vitals and nursing note reviewed.  Constitutional:      Appearance: Normal appearance. She is well-developed and well-groomed. She is obese.  HENT:     Head: Normocephalic and atraumatic.  Eyes:     Conjunctiva/sclera: Conjunctivae normal.     Pupils: Pupils are equal, round, and reactive to light.  Cardiovascular:     Rate and Rhythm: Normal rate and regular rhythm.     Heart sounds: Normal heart sounds.  Pulmonary:     Effort: Pulmonary effort is normal.     Breath sounds: Normal breath sounds.  Skin:    General: Skin is warm and dry.  Neurological:     General: No focal deficit present.     Mental Status: She is alert and oriented to person, place, and time.     Gait: Gait normal.  Psychiatric:        Attention and Perception: Attention and perception normal.        Mood and Affect: Mood and affect normal.        Speech: Speech normal.        Behavior: Behavior normal. Behavior is cooperative.        Cognition and Memory: Cognition and memory normal.     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 - Plan: traMADol (ULTRAM) 50 MG tablet  Hypertension, unspecified type Hypertension associated with diabetes (HCC) On benicar 5 mg qhs per renal  Monitor BP   HM Flu shot declines prevnar utd  pna 25utd no need to updatedue to age per guidelines but could consider Tdap utd  Consider shingrix vaccines in future covid 192/2 moderna  Eye MD seen Dr Rosalene Billings, [facility] MD 04/11/20  Glaucoma suspect   Pap 05/13/19 negative neg HPV  mammo sch 2/1/21neg Colonoscopy had in3/25/2013per pt she was ~61 per chart review was normal though I am unable to review report pt states she had at [facility] -need to get copy of reportdue 09/29/21  dexa consider in futurewill try to get pt scheduled Never smoker but exposed to 2nd hand via husband 3/4/21renal US duplex normal 09/19/15 thyroid US with 10/04/15 benign bx IMPRESSION: 1. Thyromegaly with bilateral small nodules. Dominant right lesion meets consensus criteria for biopsy. Ultrasound-guided fine needle aspiration should be considered, as per the consensus statement: Management of Thyroid Nodules Detected at [place]: Society of Radiologists in Ultrasound Consensus Conference Statement. Radiology 2005; [phone]  Provider: Dr. French Ana [name]-Internal Medicine

## 2020-05-17 NOTE — Progress Notes (Signed)
Bilateral rib pain, ongoing for a couple weeks. Pain is 8/10 mainly on the right side.

## 2020-05-18 ENCOUNTER — Telehealth: Payer: Self-pay | Admitting: Internal Medicine

## 2020-05-18 NOTE — Telephone Encounter (Signed)
Prior authorization has been submitted for patient's [facility]   Awaiting approval or denial.

## 2020-05-19 DIAGNOSIS — E1159 Type 2 diabetes mellitus with other circulatory complications: Secondary | ICD-10-CM | POA: Insufficient documentation

## 2020-05-19 DIAGNOSIS — M47816 Spondylosis without myelopathy or radiculopathy, lumbar region: Secondary | ICD-10-CM | POA: Insufficient documentation

## 2020-05-19 DIAGNOSIS — I152 Hypertension secondary to endocrine disorders: Secondary | ICD-10-CM | POA: Insufficient documentation

## 2020-05-19 DIAGNOSIS — M419 Scoliosis, unspecified: Secondary | ICD-10-CM | POA: Insufficient documentation

## 2020-05-23 DIAGNOSIS — E113413 Type 2 diabetes mellitus with severe nonproliferative diabetic retinopathy with macular edema, bilateral: Secondary | ICD-10-CM | POA: Diagnosis not present

## 2020-05-23 DIAGNOSIS — H2513 Age-related nuclear cataract, bilateral: Secondary | ICD-10-CM | POA: Diagnosis not present

## 2020-05-23 DIAGNOSIS — H40053 Ocular hypertension, bilateral: Secondary | ICD-10-CM | POA: Diagnosis not present

## 2020-05-25 DIAGNOSIS — Z23 Encounter for immunization: Secondary | ICD-10-CM | POA: Diagnosis not present

## 2020-06-08 ENCOUNTER — Ambulatory Visit: Payer: Medicare Other

## 2020-06-12 ENCOUNTER — Ambulatory Visit (INDEPENDENT_AMBULATORY_CARE_PROVIDER_SITE_OTHER): Payer: Medicare Other | Admitting: Obstetrics and Gynecology

## 2020-06-12 ENCOUNTER — Other Ambulatory Visit: Payer: Self-pay

## 2020-06-12 ENCOUNTER — Encounter: Payer: Self-pay | Admitting: Obstetrics and Gynecology

## 2020-06-12 VITALS — BP 166/82 | Ht 61.0 in | Wt 171.0 lb

## 2020-06-12 DIAGNOSIS — Z1239 Encounter for other screening for malignant neoplasm of breast: Secondary | ICD-10-CM | POA: Diagnosis not present

## 2020-06-12 DIAGNOSIS — Z01419 Encounter for gynecological examination (general) (routine) without abnormal findings: Secondary | ICD-10-CM | POA: Diagnosis not present

## 2020-06-12 DIAGNOSIS — Z1231 Encounter for screening mammogram for malignant neoplasm of breast: Secondary | ICD-10-CM | POA: Diagnosis not present

## 2020-06-12 NOTE — Telephone Encounter (Signed)
Prior authorization has been denied.  States Patient has not tired and failed formulary alternative.  Plan states that Patient would need to have tried and failed at least 2 alternative formulary medications. Onglyza is a brand name drug with no comparable alternatives so the PA has been denied each time I initiate.   Please advise

## 2020-06-12 NOTE — Progress Notes (Signed)
Annual. RM 5 

## 2020-06-12 NOTE — Progress Notes (Signed)
Gynecology Annual Exam  PCP: [name], [name], MD  Chief Complaint:  Chief Complaint  Patient presents with  . Gynecologic Exam    Annual. RM 5    History of Present Illness:Patient is a 69 y.o. G2P2 presents for annual exam. The patient has no complaints today.   LMP: No LMP recorded. Patient is postmenopausal. No PMB reported by patient.  The patient does perform self breast exams.  There is no notable family history of breast or ovarian cancer in her family.  The patient wears seatbelts: yes.   The patient has regular exercise: not asked.    The patient denies current symptoms of depression.     Patient is fully COVID vaccinated.  She reports not gyn issues in the past year.  Still has occasional discomfort at her belly  Button that she attributes to incision from her prior BTL.    Review of Systems: Review of Systems  Constitutional: Negative.  Negative for chills and fever.  HENT: Negative for congestion.   Respiratory: Negative for cough and shortness of breath.   Cardiovascular: Negative for chest pain and palpitations.  Gastrointestinal: Negative for abdominal pain, constipation, diarrhea, heartburn, nausea and vomiting.  Genitourinary: Negative.  Negative for dysuria, frequency and urgency.  Skin: Negative for itching and rash.  Neurological: Negative for dizziness and headaches.  Endo/Heme/Allergies: Negative for polydipsia.  Psychiatric/Behavioral: Negative for depression.    Past Medical History:  Patient Active Problem List   Diagnosis Date Noted  . Hypertension associated with diabetes (HCC) 05/19/2020  . Scoliosis of thoracolumbar spine 05/19/2020  . Facet hypertrophy of lumbar region 05/19/2020  . Cervical arthritis 11/26/2019  . Benign hypertensive kidney disease with chronic kidney disease 11/15/2019  . Stage 3a chronic kidney disease (HCC) 11/15/2019  . Hypertensive chronic kidney disease with stage 1 through stage 4 chronic kidney  disease, or unspecified chronic kidney disease 11/15/2019  . Pain in limb 11/12/2019  . Neck pain 08/24/2019  . Disorder of both eustachian tubes 08/24/2019  . Leg cramps 08/09/2019  . CKD (chronic kidney disease) stage 2, GFR 60-89 ml/min 08/03/2019    Dr. Ronn Melena   . Leg edema 07/22/2019  . Hyponatremia 04/17/2019  . Gastroesophageal reflux disease 09/16/2018  . Erosion of oral mucosa 10/28/2016  . Thyroid nodule 10/14/2015    Benign bx 09/2015   . Abnormal cervical Papanicolaou smear 01/11/2015  . Absolute anemia 01/11/2015  . Mild mitral insufficiency 01/11/2015    INTERPRETATION ECHO 12/07/14 NORMAL LEFT VENTRICULAR SYSTOLIC FUNCTION WITH AN ESTIMATED EF = >55 % NORMAL RIGHT VENTRICULAR SYSTOLIC FUNCTION MILD MITRAL VALVE INSUFFICIENCY TRACE TRICUSPID VALVE INSUFFICIENCY NO VALVULAR STENOSIS   FINDINGS Myoview 12/07/14: Regional wall motion:reveals normal myocardial thickening and wall  motion. The overall quality of the study is good. Artifacts noted: no Left ventricular cavity: normal.  Perfusion Analysis:SPECT images demonstrate homogeneous tracer  distribution throughout the myocardium.   . CN (constipation) 01/11/2015  . Calcium blood increased 01/11/2015  . LBP (low back pain) 01/11/2015  . Obesity (BMI 30-39.9) 01/11/2015  . Leg paresthesia 01/11/2015  . Neuralgia neuritis, sciatic nerve 01/11/2015  . Avitaminosis D 01/11/2015  . Essential hypertension 11/22/2014  . Controlled type 2 diabetes mellitus without complication (HCC) 11/22/2014    Overview:  Urine microalbumin-20 on 02/15/2013.     Past Surgical History:  Past Surgical History:  Procedure Laterality Date  . BIOPSY THYROID    . CESAREAN SECTION     1981 1988  . TUBAL LIGATION  Gynecologic History:  No LMP recorded. Patient is postmenopausal. Last Pap: Results were: 05/13/2019 NIL and HR HPV negative  Last mammogram: 08/09/2019 Results were: BI-RAD I  Obstetric History: G2P2   Family History:  Family History  Problem Relation Age of Onset  . Diabetes Mother   . Heart failure Mother   . Diabetes Sister   . Healthy Sister   . Healthy Sister   . Healthy Sister   . Diabetes Brother   . Diabetes Sister   . Heart failure Father   . Brain cancer Brother   . Throat cancer Brother   . Glaucoma Other        Runs on Paternal Side  . Breast cancer Cousin     Social History:  Social History   Socioeconomic History  . Marital status: Widowed    Spouse name: Not on file  . Number of children: 2  . Years of education: College  . Highest education level: Not on file  Occupational History  . 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 Smoking status: Never Smoker  . Smokeless tobacco: Never Used  Vaping Use  . Vaping Use: Never used  Substance and Sexual Activity  . Alcohol use: No  . Drug use: No  . Sexual activity: Not Currently    Birth control/protection: None  Other Topics Concern  . Not on file  Social History Narrative   Lives at home    Social Determinants of Health   Financial Resource Strain:   . Difficulty of Paying Living Expenses: Not on file  Food Insecurity:   . Worried About Programme researcher, broadcasting/film/video in the Last Year: Not on file  . Ran Out of Food in the Last Year: Not on file  Transportation Needs:   . Lack of Transportation (Medical): Not on file  . Lack of Transportation (Non-Medical): Not on file  Physical Activity:   . Days of Exercise per Week: Not on file  . Minutes of Exercise per Session: Not on file  Stress:   . Feeling of Stress : Not on file  Social Connections:   . Frequency of Communication with Friends and Family: Not on file  . Frequency of Social Gatherings with Friends and Family: Not on file  . Attends Religious Services: Not on file  . Active Member of Clubs or Organizations: Not on file  . Attends Banker Meetings: Not on file  . Marital Status: Not on file   Intimate Partner Violence:   . Fear of Current or Ex-Partner: Not on file  . Emotionally Abused: Not on file  . Physically Abused: Not on file  . Sexually Abused: Not on file    Allergies:  Allergies  Allergen Reactions  . Hctz [Hydrochlorothiazide]     Low na 124   . Januvia [Sitagliptin] Diarrhea  . Sulfa Antibiotics   . Losartan Rash    Medications: Prior to Admission medications   Medication Sig Start Date End Date Taking? Authorizing Provider  aspirin EC 81 MG tablet Take 81 mg by mouth daily.   Yes [provider]  fluticasone (FLONASE) 50 MCG/ACT nasal spray Place 2 sprays into both nostrils daily. 10/04/19  Yes [name], [name], MD  furosemide (LASIX) 20 MG tablet Take 1 tablet (20 mg total) by mouth daily as needed for fluid or edema. 07/13/19  Yes Margaretann Loveless, PA-C  loratadine (CLARITIN) 10 MG tablet Take 10 mg by  mouth daily as needed for allergies.   Yes [provider]  magnesium oxide (MAG-OX) 400 MG tablet Take 400 mg by mouth daily.   Yes [provider]  metFORMIN (GLUCOPHAGE) 1000 MG tablet Take 1 tablet (1,000 mg total) by mouth daily. 11/16/19  Yes [name], [name], MD  Multiple Vitamin (MULTIVITAMIN WITH MINERALS) TABS tablet Take 1 tablet by mouth daily.   Yes [provider]  ONGLYZA 2.5 MG TABS tablet Take 1 tablet (2.5 mg total) by mouth daily. januvia caused diarrhea 05/17/20  Yes [name], [name], MD  vitamin C (ASCORBIC ACID) 500 MG tablet Take 500 mg by mouth daily.   Yes [provider]  hydrocortisone (ANUSOL-HC) 2.5 % rectal cream Place 1 application rectally 3 (three) times daily. Patient not taking: Reported on 06/12/2020 05/17/20   [name], [name], MD  olmesartan (BENICAR) 5 MG tablet Take by mouth. Patient not taking: Reported on 06/12/2020 05/16/20 05/16/21  [provider]  triamcinolone cream (KENALOG) 0.1 % Apply 1 application topically 2 (two) times  daily. Left lower leg bid prn Patient not taking: Reported on 06/12/2020 08/06/19   [name], [name], MD    Physical Exam Vitals: Blood pressure (!) 166/82, height 5\' 1"  (1.549 m), weight 171 lb (77.6 kg).  General: NAD HEENT: normocephalic, anicteric Thyroid: no enlargement, no palpable nodules Pulmonary: No increased work of breathing, CTAB Cardiovascular: RRR, distal pulses 2+ Breast: Breast symmetrical, no tenderness, no palpable nodules or masses, no skin or nipple retraction present, no nipple discharge.  No axillary or supraclavicular lymphadenopathy. Abdomen: NABS, soft, non-tender, non-distended.  Umbilicus without lesions.  No hepatomegaly, splenomegaly or masses palpable. No evidence of hernia  Genitourinary:  External: Normal external female genitalia.  Normal urethral meatus, normal Bartholin's and Skene's glands.    Vagina: Normal vaginal mucosa, no evidence of prolapse.    Cervix: Grossly normal in appearance, no bleeding  Uterus: Non-enlarged, mobile, normal contour.  No CMT  Adnexa: ovaries non-enlarged, no adnexal masses  Rectal: deferred  Lymphatic: no evidence of inguinal lymphadenopathy Extremities: no edema, erythema, or tenderness Neurologic: Grossly intact Psychiatric: mood appropriate, affect full  Female chaperone present for pelvic and breast  portions of the physical exam  Immunization History  Administered Date(s) Administered  . Hepatitis B 09/20/2011, 11/01/2011, 04/03/2012  . Hepatitis B, adult 09/20/2011, 11/01/2011, 04/03/2012  . Moderna SARS-COVID-2 Vaccination 10/19/2019, 11/16/2019  . Pneumococcal Conjugate-13 03/12/2018  . Pneumococcal Polysaccharide-23 05/19/2013, 05/19/2013  . Tdap 02/07/2012   Assessment: 70 y.o. G2P2 routine annual exam  Plan: Problem List Items Addressed This Visit    None    Visit Diagnoses    Breast cancer screening by mammogram    -  Primary   Relevant Orders   MM 3D SCREEN BREAST BILATERAL   Encounter  for gynecological examination without abnormal finding       Breast screening          1) Mammogram - recommend yearly screening mammogram.  Mammogram Was ordered today  2) STI screening  was notoffered and therefore not obtained  3) ASCCP guidelines and rational discussed.  Patient opts for every 2 year screening interval  4) Osteoporosis  - per USPTF routine screening DEXA at age 25  5) Routine healthcare maintenance including cholesterol, diabetes screening discussed managed by PCP  6) Colonoscopy normal 09/30/2011 next 10/02/2011  7) Return in about 1 year (around 06/12/2021) for annual.    14/12/2020, MD Vena Austria, [facility] 06/12/2020, 11:01  AM

## 2020-06-12 NOTE — Patient Instructions (Signed)
[facility] [address] [place] [address]  [facility] [place]  [address] [place] [address]  Phone: [phone]  

## 2020-06-15 ENCOUNTER — Telehealth: Payer: Self-pay | Admitting: Internal Medicine

## 2020-06-15 NOTE — Telephone Encounter (Signed)
Left message for patient to call back and schedule Medicare Annual Wellness Visit (AWV)   This should be a telephone visit only=30 minutes.  Last AWV 06/07/2019; please schedule at anytime with Denisa O'Brien-Blaney at [facility].

## 2020-06-16 DIAGNOSIS — H40053 Ocular hypertension, bilateral: Secondary | ICD-10-CM | POA: Diagnosis not present

## 2020-06-23 ENCOUNTER — Ambulatory Visit (INDEPENDENT_AMBULATORY_CARE_PROVIDER_SITE_OTHER): Payer: Medicare Other

## 2020-06-23 VITALS — BP 128/81 | HR 78 | Ht 61.0 in | Wt 171.0 lb

## 2020-06-23 DIAGNOSIS — Z0000 Encounter for general adult medical examination without abnormal findings: Secondary | ICD-10-CM

## 2020-06-23 NOTE — Patient Instructions (Addendum)
Natasha Phillips , Thank you for taking time to come for your Medicare Wellness Visit. I appreciate your ongoing commitment to your health goals. Please review the following plan we discussed and let me know if I can assist you in the future.   These are the goals we discussed: Goals    . HEMOGLOBIN A1C < 7.0    . LIFESTYLE - DECREASE FALLS RISK     Recommend to remove any items from the home that may cause slips or trips.    . Peak Blood Glucose < 180       This is a list of the screening recommended for you and due dates:  Health Maintenance  Topic Date Due  . DEXA scan (bone density measurement)  Never done  . Flu Shot  10/05/2020*  . Pneumonia vaccines (2 of 2 - PPSV23) 05/21/2021*  . Complete foot exam   08/23/2020  . Hemoglobin A1C  09/25/2020  . Eye exam for diabetics  06/16/2021  . Mammogram  08/08/2021  . Colon Cancer Screening  09/29/2021  . Tetanus Vaccine  02/06/2022  . COVID-19 Vaccine  Completed  .  Hepatitis C: One time screening is recommended by Center for Disease Control  (CDC) for  adults born from [birth year] through 1963.   Completed  *Topic was postponed. The date shown is not the original due date.    Immunizations Immunization History  Administered Date(s) Administered  . Hepatitis B 09/20/2011, 11/01/2011, 04/03/2012  . Hepatitis B, adult 09/20/2011, 11/01/2011, 04/03/2012  . Moderna Sars-Covid-2 Vaccination 10/19/2019, 11/16/2019, 05/25/2020  . Pneumococcal Conjugate-13 03/12/2018  . Pneumococcal Polysaccharide-23 05/19/2013, 05/19/2013  . Tdap 02/07/2012   Keep all routine maintenance appointments.   Follow up 09/28/20  Advanced directives: End of life planning; Advance aging; Advanced directives discussed.  Copy of current HCPOA/Living Will requested.    Conditions/risks identified: None new  Follow up in one year for your annual wellness visit.   Preventive Care 67 Years and Older, Female Preventive care refers to lifestyle choices and visits with  your health care provider that can promote health and wellness. What does preventive care include?  A yearly physical exam. This is also called an annual well check.  Dental exams once or twice a year.  Routine eye exams. Ask your health care provider how often you should have your eyes checked.  Personal lifestyle choices, including:  Daily care of your teeth and gums.  Regular physical activity.  Eating a healthy diet.  Avoiding tobacco and drug use.  Limiting alcohol use.  Practicing safe sex.  Taking low-dose aspirin every day.  Taking vitamin and mineral supplements as recommended by your health care provider. What happens during an annual well check? The services and screenings done by your health care provider during your annual well check will depend on your age, overall health, lifestyle risk factors, and family history of disease. Counseling  Your health care provider may ask you questions about your:  Alcohol use.  Tobacco use.  Drug use.  Emotional well-being.  Home and relationship well-being.  Sexual activity.  Eating habits.  History of falls.  Memory and ability to understand (cognition).  Work and work Astronomer.  Reproductive health. Screening  You may have the following tests or measurements:  Height, weight, and BMI.  Blood pressure.  Lipid and cholesterol levels. These may be checked every 5 years, or more frequently if you are over 2 years old.  Skin check.  Lung cancer screening. You may have  this screening every year starting at age 74 if you have a 30-pack-year history of smoking and currently smoke or have quit within the past 15 years.  Fecal occult blood test (FOBT) of the stool. You may have this test every year starting at age 73.  Flexible sigmoidoscopy or colonoscopy. You may have a sigmoidoscopy every 5 years or a colonoscopy every 10 years starting at age 43.  Hepatitis C blood test.  Hepatitis B blood  test.  Sexually transmitted disease (STD) testing.  Diabetes screening. This is done by checking your blood sugar (glucose) after you have not eaten for a while (fasting). You may have this done every 1-3 years.  Bone density scan. This is done to screen for osteoporosis. You may have this done starting at age 14.  Mammogram. This may be done every 1-2 years. Talk to your health care provider about how often you should have regular mammograms. Talk with your health care provider about your test results, treatment options, and if necessary, the need for more tests. Vaccines  Your health care provider may recommend certain vaccines, such as:  Influenza vaccine. This is recommended every year.  Tetanus, diphtheria, and acellular pertussis (Tdap, Td) vaccine. You may need a Td booster every 10 years.  Zoster vaccine. You may need this after age 70.  Pneumococcal 13-valent conjugate (PCV13) vaccine. One dose is recommended after age 16.  Pneumococcal polysaccharide (PPSV23) vaccine. One dose is recommended after age 16. Talk to your health care provider about which screenings and vaccines you need and how often you need them. This information is not intended to replace advice given to you by your health care provider. Make sure you discuss any questions you have with your health care provider. Document Released: 07/21/2015 Document Revised: 03/13/2016 Document Reviewed: 04/25/2015 Elsevier Interactive Patient Education  2017 [employer].  Fall Prevention in the Home Falls can cause injuries. They can happen to people of all ages. There are many things you can do to make your home safe and to help prevent falls. What can I do on the outside of my home?  Regularly fix the edges of walkways and driveways and fix any cracks.  Remove anything that might make you trip as you walk through a door, such as a raised step or threshold.  Trim any bushes or trees on the path to your home.  Use  bright outdoor lighting.  Clear any walking paths of anything that might make someone trip, such as rocks or tools.  Regularly check to see if handrails are loose or broken. Make sure that both sides of any steps have handrails.  Any raised decks and porches should have guardrails on the edges.  Have any leaves, snow, or ice cleared regularly.  Use sand or salt on walking paths during winter.  Clean up any spills in your garage right away. This includes oil or grease spills. What can I do in the bathroom?  Use night lights.  Install grab bars by the toilet and in the tub and shower. Do not use towel bars as grab bars.  Use non-skid mats or decals in the tub or shower.  If you need to sit down in the shower, use a plastic, non-slip stool.  Keep the floor dry. Clean up any water that spills on the floor as soon as it happens.  Remove soap buildup in the tub or shower regularly.  Attach bath mats securely with double-sided non-slip rug tape.  Do not have  throw rugs and other things on the floor that can make you trip. What can I do in the bedroom?  Use night lights.  Make sure that you have a light by your bed that is easy to reach.  Do not use any sheets or blankets that are too big for your bed. They should not hang down onto the floor.  Have a firm chair that has side arms. You can use this for support while you get dressed.  Do not have throw rugs and other things on the floor that can make you trip. What can I do in the kitchen?  Clean up any spills right away.  Avoid walking on wet floors.  Keep items that you use a lot in easy-to-reach places.  If you need to reach something above you, use a strong step stool that has a grab bar.  Keep electrical cords out of the way.  Do not use floor polish or wax that makes floors slippery. If you must use wax, use non-skid floor wax.  Do not have throw rugs and other things on the floor that can make you trip. What can  I do with my stairs?  Do not leave any items on the stairs.  Make sure that there are handrails on both sides of the stairs and use them. Fix handrails that are broken or loose. Make sure that handrails are as long as the stairways.  Check any carpeting to make sure that it is firmly attached to the stairs. Fix any carpet that is loose or worn.  Avoid having throw rugs at the top or bottom of the stairs. If you do have throw rugs, attach them to the floor with carpet tape.  Make sure that you have a light switch at the top of the stairs and the bottom of the stairs. If you do not have them, ask someone to add them for you. What else can I do to help prevent falls?  Wear shoes that:  Do not have high heels.  Have rubber bottoms.  Are comfortable and fit you well.  Are closed at the toe. Do not wear sandals.  If you use a stepladder:  Make sure that it is fully opened. Do not climb a closed stepladder.  Make sure that both sides of the stepladder are locked into place.  Ask someone to hold it for you, if possible.  Clearly mark and make sure that you can see:  Any grab bars or handrails.  First and last steps.  Where the edge of each step is.  Use tools that help you move around (mobility aids) if they are needed. These include:  Canes.  Walkers.  Scooters.  Crutches.  Turn on the lights when you go into a dark area. Replace any light bulbs as soon as they burn out.  Set up your furniture so you have a clear path. Avoid moving your furniture around.  If any of your floors are uneven, fix them.  If there are any pets around you, be aware of where they are.  Review your medicines with your doctor. Some medicines can make you feel dizzy. This can increase your chance of falling. Ask your doctor what other things that you can do to help prevent falls. This information is not intended to replace advice given to you by your health care provider. Make sure you  discuss any questions you have with your health care provider. Document Released: 04/20/2009 Document Revised: 11/30/2015 Document Reviewed:  07/29/2014 Elsevier Interactive Patient Education  2017 [employer].

## 2020-06-23 NOTE — Telephone Encounter (Signed)
Faxed request for statin rx over to [facility] on 06/23/20. -Dr [employer] states "will disc at f/u"

## 2020-06-23 NOTE — Progress Notes (Signed)
Subjective:   Natasha Phillips is a 69 y.o. female who presents for Medicare Annual (Subsequent) preventive examination.  Review of Systems    No ROS.  Medicare Wellness Virtual Visit.    Cardiac Risk Factors include: advanced age (>6855men, 8>65 women);diabetes mellitus;hypertension     Objective:    Today's Vitals   06/23/20 0916  BP: 128/81  Pulse: 78  Weight: 171 lb (77.6 kg)  Height: 5\' 1"  (1.549 m)   Body mass index is 32.31 kg/m.  Advanced Directives 06/23/2020 06/07/2019 05/18/2019 04/17/2019 04/17/2019 01/31/2019 10/13/2015  Does Patient Have a Medical Advance Directive? Yes No Yes No No No No  Does patient want to make changes to medical advance directive? No - Patient declined - - - - - -  Would patient like information on creating a medical advance directive? - No - Patient declined - No - Patient declined - - -    Current Medications (verified) Outpatient Encounter Medications as of 06/23/2020  Medication Sig  . aspirin EC 81 MG tablet Take 81 mg by mouth daily.  . fluticasone (FLONASE) 50 MCG/ACT nasal spray Place 2 sprays into both nostrils daily.  . furosemide (LASIX) 20 MG tablet Take 1 tablet (20 mg total) by mouth daily as needed for fluid or edema.  . hydrocortisone (ANUSOL-HC) 2.5 % rectal cream Place 1 application rectally 3 (three) times daily. (Patient not taking: Reported on 06/12/2020)  . loratadine (CLARITIN) 10 MG tablet Take 10 mg by mouth daily as needed for allergies.  . magnesium oxide (MAG-OX) 400 MG tablet Take 400 mg by mouth daily.  . metFORMIN (GLUCOPHAGE) 1000 MG tablet Take 1 tablet (1,000 mg total) by mouth daily.  . Multiple Vitamin (MULTIVITAMIN WITH MINERALS) TABS tablet Take 1 tablet by mouth daily.  Marland Kitchen. olmesartan (BENICAR) 5 MG tablet Take by mouth. (Patient not taking: Reported on 06/12/2020)  . ONGLYZA 2.5 MG TABS tablet Take 1 tablet (2.5 mg total) by mouth daily. januvia caused diarrhea  . triamcinolone cream (KENALOG) 0.1 % Apply 1  application topically 2 (two) times daily. Left lower leg bid prn (Patient not taking: Reported on 06/12/2020)  . vitamin C (ASCORBIC ACID) 500 MG tablet Take 500 mg by mouth daily.   No facility-administered encounter medications on file as of 06/23/2020.    Allergies (verified) Hctz [hydrochlorothiazide], Januvia [sitagliptin], Sulfa antibiotics, and Losartan   History: Past Medical History:  Diagnosis Date  . Anemia   . Diabetes mellitus without complication (HCC)   . Glaucoma    [facility] with macular edema and Dr. Frederico Hammanenika Johnson sees Q3-4 months  . Hypertension    Past Surgical History:  Procedure Laterality Date  . BIOPSY THYROID    . CESAREAN SECTION     1981 1988  . TUBAL LIGATION     Family History  Problem Relation Age of Onset  . Diabetes Mother   . Heart failure Mother   . Diabetes Sister   . Healthy Sister   . Healthy Sister   . Healthy Sister   . Diabetes Brother   . Diabetes Sister   . Heart failure Father   . Brain cancer Brother   . Throat cancer Brother   . Glaucoma Other        Runs on Paternal Side  . Breast cancer Cousin    Social History   Socioeconomic History  . Marital status: Widowed    Spouse name: Not on file  . Number of children: 2  . Years  of education: College  . Highest education level: Not on file  Occupational History  . 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 Smoking status: Never Smoker  . Smokeless tobacco: Never Used  Vaping Use  . Vaping Use: Never used  Substance and Sexual Activity  . Alcohol use: No  . Drug use: No  . Sexual activity: Not Currently    Birth control/protection: None  Other Topics Concern  . Not on file  Social History Narrative   Lives at home    Social Determinants of Health   Financial Resource Strain: Not on file  Food Insecurity: Not on file  Transportation Needs: Not on file  Physical Activity: Not on file  Stress: Not on file  Social  Connections: Not on file    Tobacco Counseling Counseling given: Not Answered   Clinical Intake:  Pre-visit preparation completed: Yes        Diabetes: Yes (Followed by PCP)  How often do you need to have someone help you when you read instructions, pamphlets, or other written materials from your doctor or pharmacy?: 1 - Never   Nutrition Risk Assessment: Has the patient had any N/V/D within the last 2 weeks?  No  Does the patient have any non-healing wounds?  No  Has the patient had any unintentional weight loss or weight gain?  No   Diabetes: If diabetic, was a CBG obtained today?  Yes  Did the patient bring in their glucometer from home?  No  How often do you monitor your CBG's? 3-4x week.   Financial Strains and Diabetes Management: Are you having any financial strains with the device, your supplies or your medication? No .  Does the patient want to be seen by Chronic Care Management for management of their diabetes?  No  Would the patient like to be referred to a Nutritionist or for Diabetic Management?  No   Diabetic Exams: Diabetic Eye Exam: Completed 06/16/20 Diabetic Foot Exam: Completed 08/24/19  Interpreter Needed?: No      Activities of Daily Living In your present state of health, do you have any difficulty performing the following activities: 06/23/2020  Hearing? N  Vision? N  Difficulty concentrating or making decisions? N  Walking or climbing stairs? N  Dressing or bathing? N  Doing errands, shopping? N  Preparing Food and eating ? N  Using the Toilet? N  In the past six months, have you accidently leaked urine? N  Do you have problems with loss of bowel control? N  Managing your Medications? N  Managing your Finances? N  Housekeeping or managing your Housekeeping? N  Some recent data might be hidden    Patient Care Team: [name], [name], MD as PCP - General (Internal Medicine) Vena Austria, MD as Referring Physician  (Obstetrics and Gynecology)  Indicate any recent Medical Services you may have received from other than [facility] providers in the past year (date may be approximate).     Assessment:   This is a routine wellness examination for Natasha Phillips.  I connected with Natasha Phillips today by telephone and verified that I am speaking with the correct person using two identifiers. Location patient: home Location provider: work Persons participating in the virtual visit: patient, Engineer, civil (consulting).    I discussed the limitations, risks, security and privacy concerns of performing an evaluation and management service by telephone and the availability of in person appointments. The patient expressed understanding and verbally consented to  this telephonic visit.    Interactive audio and video telecommunications were attempted between this provider and patient, however failed, due to patient having technical difficulties OR patient did not have access to video capability.  We continued and completed visit with audio only.  Some vital signs may be absent or patient reported.   Hearing/Vision screen  Hearing Screening   125Hz  250Hz  500Hz  1000Hz  2000Hz  3000Hz  4000Hz  6000Hz  8000Hz   Right ear:           Left ear:           Comments: Patient is able to hear conversational tones without difficulty.  No issues reported.  Vision Screening Comments: Wears corrective lenses Visual acuity not assessed, virtual visit.  They have seen their ophthalmologist in the last 6 months.     Dietary issues and exercise activities discussed:    Mediterranean diet Good water intake  Goals    . HEMOGLOBIN A1C < 7.0    . LIFESTYLE - DECREASE FALLS RISK     Recommend to remove any items from the home that may cause slips or trips.    . Peak Blood Glucose < 180      Depression Screen PHQ 2/9 Scores 06/23/2020 06/12/2020 03/28/2020 11/24/2019 08/24/2019 08/06/2019 06/07/2019  PHQ - 2 Score 0 0 0 0 0 0 0  PHQ- 9 Score 0 0 - - - - -    Fall  Risk Fall Risk  06/23/2020 05/17/2020 03/28/2020 11/24/2019 08/24/2019  Falls in the past year? 0 0 0 0 0  Number falls in past yr: 0 0 0 0 0  Injury with Fall? 0 0 0 0 0  Follow up Falls evaluation completed Falls evaluation completed Falls evaluation completed Falls evaluation completed Falls evaluation completed    FALL RISK PREVENTION PERTAINING TO THE HOME: Handrails in use when climbing stairs? Yes Home free of loose throw rugs in walkways, pet beds, electrical cords, etc? Yes  Adequate lighting in your home to reduce risk of falls? Yes   ASSISTIVE DEVICES UTILIZED TO PREVENT FALLS: Use of a cane, walker or w/c? No   TIMED UP AND GO: Was the test performed? No . Virtual visit.   Cognitive Function:  Patient is alert and oriented x3.  Denies difficulty focusing, making decisions, memory loss.  MMSE/6CIT deferred. Normal by direct communication/observation.    6CIT Screen 06/07/2019  What Year? 0 points  What month? 0 points  What time? 0 points  Count back from 20 0 points  Months in reverse 0 points  Repeat phrase 0 points  Total Score 0    Immunizations Immunization History  Administered Date(s) Administered  . Hepatitis B 09/20/2011, 11/01/2011, 04/03/2012  . Hepatitis B, adult 09/20/2011, 11/01/2011, 04/03/2012  . Moderna Sars-Covid-2 Vaccination 10/19/2019, 11/16/2019, 05/25/2020  . Pneumococcal Conjugate-13 03/12/2018  . Pneumococcal Polysaccharide-23 05/19/2013, 05/19/2013  . Tdap 02/07/2012   Health Maintenance Health Maintenance  Topic Date Due  . DEXA SCAN  Never done  . INFLUENZA VACCINE  10/05/2020 (Originally 02/06/2020)  . PNA vac Low Risk Adult (2 of 2 - PPSV23) 05/21/2021 (Originally 03/13/2019)  . FOOT EXAM  08/23/2020  . HEMOGLOBIN A1C  09/25/2020  . OPHTHALMOLOGY EXAM  06/16/2021  . MAMMOGRAM  08/08/2021  . COLONOSCOPY  09/29/2021  . TETANUS/TDAP  02/06/2022  . COVID-19 Vaccine  Completed  . Hepatitis C Screening  Completed   Colorectal  cancer screening: Type of screening: Colonoscopy. Completed 09/30/11. Repeat every 10 years  Mammogram status: Completed 08/09/19. Repeat  every year. 3D Screen, Bilateral.  Bone Density- scheduled 08/09/20  Lung Cancer Screening: (Low Dose CT Chest recommended if Age 54-80 years, 30 pack-year currently smoking OR have quit w/in 15years.) does not qualify.   Hepatitis C Screening:  Completed 08/06/19  Vision Screening: Recommended annual ophthalmology exams for early detection of glaucoma and other disorders of the eye. Is the patient up to date with their annual eye exam?  Yes   Dental Screening: Recommended annual dental exams for proper oral hygiene  Community Resource Referral / Chronic Care Management: CRR required this visit?  No   CCM required this visit?  No      Plan:   Keep all routine maintenance appointments.   Follow up 09/28/20  I have personally reviewed and noted the following in the patient's chart:   . Medical and social history . Use of alcohol, tobacco or illicit drugs  . Current medications and supplements . Functional ability and status . Nutritional status . Physical activity . Advanced directives . List of other physicians . Hospitalizations, surgeries, and ER visits in previous 12 months . Vitals . Screenings to include cognitive, depression, and falls . Referrals and appointments  In addition, I have reviewed and discussed with patient certain preventive protocols, quality metrics, and best practice recommendations. A written personalized care plan for preventive services as well as general preventive health recommendations were provided to patient via mychart.     Ashok Pall, LPN   [id number]    Nurse notes: Constipation improved, but still present. Office visit with GI in January.

## 2020-06-26 ENCOUNTER — Other Ambulatory Visit: Payer: Self-pay | Admitting: Internal Medicine

## 2020-06-26 DIAGNOSIS — E1159 Type 2 diabetes mellitus with other circulatory complications: Secondary | ICD-10-CM

## 2020-06-26 DIAGNOSIS — I152 Hypertension secondary to endocrine disorders: Secondary | ICD-10-CM

## 2020-06-26 NOTE — Telephone Encounter (Signed)
She failed Januvia and Tradjenta is not on formulary states onglyza and Januvia are Please try to get onglyza approved   Thanks TMS

## 2020-06-27 ENCOUNTER — Encounter: Payer: Self-pay | Admitting: Internal Medicine

## 2020-06-27 ENCOUNTER — Telehealth: Payer: Self-pay | Admitting: Internal Medicine

## 2020-06-27 NOTE — Telephone Encounter (Signed)
Spoke with the Patient and she states that for the last few days she has been having burning in her stomach and constipation. She states this was discussed at her last visit and she was asked if there was burning, there is now.  Patient states she has a history of reflux and constipation, stomach cramping and pain. States she has been trying colace, lemon water, and lemon tea. Her last bowel movement was today. This was soft/liquid and green.  Patient also having stomach tightness.   Spoke with Dr French Ana [name] and she states that she referred to Patient to [facility]bruster, Willaim Rayas, MD and that the Patient should establish with GI and try Miralax OTC until she can be seen.   Called to inform the Patient and she states she is scheduled to see them 07/13/20 and she will try the Miralax.  For your information

## 2020-06-27 NOTE — Telephone Encounter (Signed)
Would you like to refer Patient to [facility]? Have failed multiple times to approve this medication with current information.

## 2020-06-27 NOTE — Telephone Encounter (Signed)
Also can try otc pepcid, prilosec or nexium for GERD and f/u GI

## 2020-06-27 NOTE — Telephone Encounter (Signed)
Pt called she is having a burning feeling in her stomach and wanted to know if something could be called in

## 2020-06-28 DIAGNOSIS — N1831 Chronic kidney disease, stage 3a: Secondary | ICD-10-CM | POA: Diagnosis not present

## 2020-06-28 DIAGNOSIS — I129 Hypertensive chronic kidney disease with stage 1 through stage 4 chronic kidney disease, or unspecified chronic kidney disease: Secondary | ICD-10-CM | POA: Diagnosis not present

## 2020-06-28 DIAGNOSIS — E1122 Type 2 diabetes mellitus with diabetic chronic kidney disease: Secondary | ICD-10-CM | POA: Diagnosis not present

## 2020-07-04 ENCOUNTER — Telehealth: Payer: Self-pay

## 2020-07-04 NOTE — Chronic Care Management (AMB) (Signed)
  Chronic Care Management   Note  07/04/2020 Name: Natasha Phillips MRN: [id number] DOB: Nov 12, 1950  Natasha Phillips is a 69 y.o. year old female who is a primary care patient of [name], Nino Glow, MD. I reached out to Mirian Capuchin by phone today in response to a referral sent by Ms. Natasha [employer]'s PCP, Orland Mustard, MD      [name] was given information about Chronic Care Management services today including:  1. CCM service includes personalized support from designated clinical staff supervised by her physician, including individualized plan of care and coordination with other care providers 2. 24/7 contact phone numbers for assistance for urgent and routine care needs. 3. Service will only be billed when office clinical staff spend 20 minutes or more in a month to coordinate care. 4. Only one practitioner may furnish and bill the service in a calendar month. 5. The patient may stop CCM services at any time (effective at the end of the month) by phone call to the office staff. 6. The patient will be responsible for cost sharing (co-pay) of up to 20% of the service fee (after annual deductible is met).  Patient agreed to services and verbal consent obtained.   Follow up plan: Telephone appointment with care management team member scheduled for:07/27/2020  Noreene Larsson, [place], [place], [place] [address] Direct Dial: [phone] [email or web address] Website: [email or web address]

## 2020-07-04 NOTE — Telephone Encounter (Signed)
I'd be happy to try!

## 2020-07-04 NOTE — Telephone Encounter (Signed)
Can you help?

## 2020-07-04 NOTE — Addendum Note (Signed)
Addended by: Quentin Ore on: 07/04/2020 08:10 AM   Modules accepted: Orders

## 2020-07-13 ENCOUNTER — Ambulatory Visit (INDEPENDENT_AMBULATORY_CARE_PROVIDER_SITE_OTHER): Payer: Medicare Other | Admitting: Gastroenterology

## 2020-07-13 ENCOUNTER — Encounter: Payer: Self-pay | Admitting: Gastroenterology

## 2020-07-13 ENCOUNTER — Other Ambulatory Visit (INDEPENDENT_AMBULATORY_CARE_PROVIDER_SITE_OTHER): Payer: Medicare Other

## 2020-07-13 VITALS — BP 148/88 | HR 75 | Ht 61.0 in | Wt 174.0 lb

## 2020-07-13 DIAGNOSIS — R194 Change in bowel habit: Secondary | ICD-10-CM | POA: Diagnosis not present

## 2020-07-13 DIAGNOSIS — K219 Gastro-esophageal reflux disease without esophagitis: Secondary | ICD-10-CM | POA: Diagnosis not present

## 2020-07-13 DIAGNOSIS — R1013 Epigastric pain: Secondary | ICD-10-CM | POA: Diagnosis not present

## 2020-07-13 DIAGNOSIS — K625 Hemorrhage of anus and rectum: Secondary | ICD-10-CM

## 2020-07-13 DIAGNOSIS — K648 Other hemorrhoids: Secondary | ICD-10-CM

## 2020-07-13 LAB — H. PYLORI ANTIBODY, IGG: H Pylori IgG: POSITIVE — AB

## 2020-07-13 MED ORDER — FAMOTIDINE 20 MG PO TABS: 20.0000 mg | ORAL_TABLET | Freq: Every day | ORAL | 3 refills | Status: DC

## 2020-07-13 MED ORDER — SUTAB 1479-225-188 MG PO TABS: 1.0000 | ORAL_TABLET | Freq: Once | ORAL | 0 refills | Status: AC

## 2020-07-13 MED ORDER — CALMOL-4 76-10 % RE SUPP: RECTAL | 0 refills | Status: DC

## 2020-07-13 MED ORDER — FAMOTIDINE 20 MG PO TABS: 20.0000 mg | ORAL_TABLET | Freq: Two times a day (BID) | ORAL | 3 refills | Status: DC

## 2020-07-13 MED ORDER — FIBER CHOICE FRUITY BITES 1.5 G PO CHEW: CHEWABLE_TABLET | ORAL | 0 refills | Status: DC

## 2020-07-13 NOTE — Patient Instructions (Addendum)
If you are age 68 or older, your body mass index should be between 23-30. Your Body mass index is 32.88 kg/m. If this is out of the aforementioned range listed, please consider follow up with your Primary Care Provider.  If you are age 16 or younger, your body mass index should be between 19-25. Your Body mass index is 32.88 kg/m. If this is out of the aformentioned range listed, please consider follow up with your Primary Care Provider.   Please go to the lab in the basement of our building to have lab work done as you leave today. Hit "B" for basement when you get on the elevator.  When the doors open the lab is on your left.  We will call you with the results. Thank you.  Due to recent changes in healthcare laws, you may see the results of your imaging and laboratory studies on MyChart before your provider has had a chance to review them.  We understand that in some cases there may be results that are confusing or concerning to you. Not all laboratory results come back in the same time frame and the provider may be waiting for multiple results in order to interpret others.  Please give [place] 48 hours in order for your provider to thoroughly review all the results before contacting the office for clarification of your results.    You have been scheduled for a colonoscopy. Please follow written instructions given to you at your visit today.  Please pick up your prep supplies at the pharmacy within the next 1-3 days. If you use inhalers (even only as needed), please bring them with you on the day of your procedure.   We have given you samples of the following medication to take: Fiber Choice: Take daily (or a similar daily fiber supplement) Calmol 4 suppositories - for Hemorrhoids (take as directed)  We have sent the following medications to your pharmacy for you to pick up at your convenience: Pepcid 20 mg: Take once daily  Thank you for entrusting me with your care and for choosing [facility], Dr. Ileene Patrick

## 2020-07-13 NOTE — Progress Notes (Signed)
HPI :  70 y/o female with a history of DM, stage 3 CKD, HTN, referred by Olivia Mackie [name] MD, for blood in the stools, constipation, epigastric discomfort.  The patient states she has had constipation ongoing for "a while".  She may normally have a bowel movement once a day to 3-4 times a week.  More recently she has had less frequent stooling and passing hard stools.  She has noted blood periodically both on the toilet paper and in the toilet bowl.  She may see this a few times a week and then not see it for a few weeks, it comes and goes.  She has used some MiraLAX in the past dosed at once daily and she thinks this helped her constipation but she felt that she had much more bleeding on the MiraLAX and stopped it.  She has been using Dulcolax as needed which she states helps when she uses it.  She has not tried a daily fiber supplement yet.  She has had a colonoscopy in 2013, we do not have any report of that on file, she states she thought it was normal.  She does have some discomfort in her rectal area with a bowel movement at times.  She denies any family history of colon cancer.  No lower abdominal pain associated with this.  She otherwise has some reflux symptoms that have been bothering her.  She has occasional pyrosis and regurgitation, as well as occasional belching that will bother her.  This has been a fairly recent occurrence since this past October.  Associated with this she has had some burning in her epigastric area, attempts to bother her more so after she eats although rarely in between meals as well.  No nausea or vomiting, no dysphagia.  She denies any early satiety.  She did have an aunt who had gastric cancer but no one else in the family with this.  She denies NSAID use, uses Tylenol only as needed.  She does take a baby aspirin daily.  She has tried some Tums as well as chewable Rolaids which she states helps her upper tract symptoms.  She has not tried H2 blocker or PPI.  She  also drinks ginger tea which helps at times.  No weight loss.  Labs in care everywhere reviewed, she has a hemoglobin 11.7 most recently in September.  Last renal function is from December 22, BUN 22, creatinine 0.97, GFR of 70.   Echocardiogram 09/09/19 - EF 55-60%,   Colonoscopy 09/30/11 - no report on file  Labs in care everywhere: 03/28/20 Hgb 11.7, MCV 89, plt 316    Past Medical History:  Diagnosis Date  . Anemia   . CKD (chronic kidney disease)   . Diabetes mellitus without complication ([place])   . Glaucoma    [facility] with macular edema and Dr. Rupert Stacks sees Q3-4 months  . Hypertension      Past Surgical History:  Procedure Laterality Date  . BIOPSY THYROID    . [place]  . TUBAL LIGATION     Family History  Problem Relation Age of Onset  . Diabetes Mother   . Heart failure Mother   . Diabetes Sister   . Healthy Sister   . Healthy Sister   . Healthy Sister   . Diabetes Brother   . Diabetes Sister   . Heart failure Father   . Brain cancer Brother   . Throat cancer Brother   .  Glaucoma Other        Runs on Paternal Side  . Breast cancer Cousin    Social History   Tobacco Use  . Smoking status: Never Smoker  . Smokeless tobacco: Never Used  Vaping Use  . Vaping Use: Never used  Substance Use Topics  . Alcohol use: No  . Drug use: No   Current Outpatient Medications  Medication Sig Dispense Refill  . aspirin EC 81 MG tablet Take 81 mg by mouth daily.    . famotidine (PEPCID) 20 MG tablet Take 1 tablet (20 mg total) by mouth daily. 30 tablet 3  . fluticasone (FLONASE) 50 MCG/ACT nasal spray Place 2 sprays into both nostrils daily. 16 g 11  . furosemide (LASIX) 20 MG tablet Take 1 tablet (20 mg total) by mouth daily as needed for fluid or edema. 30 tablet 3  . hydrocortisone (ANUSOL-HC) 2.5 % rectal cream Place 1 application rectally 3 (three) times daily. 30 g 11  . Inulin (FIBER CHOICE FRUITY BITES) 1.5 g CHEW Take as directed  30 tablet 0  . loratadine (CLARITIN) 10 MG tablet Take 10 mg by mouth daily as needed for allergies.    . magnesium oxide (MAG-OX) 400 MG tablet Take 400 mg by mouth daily.    . metFORMIN (GLUCOPHAGE) 1000 MG tablet Take 1 tablet (1,000 mg total) by mouth daily. 90 tablet 4  . Multiple Vitamin (MULTIVITAMIN WITH MINERALS) TABS tablet Take 1 tablet by mouth daily.    Marland Kitchen olmesartan (BENICAR) 5 MG tablet Take by mouth.    . ONGLYZA 2.5 MG TABS tablet Take 1 tablet (2.5 mg total) by mouth daily. januvia caused diarrhea 90 tablet 3  . Rectal Protectant-Emollient (CALMOL-4) 76-10 % SUPP Take as directed 6 suppository 0  . Sodium Sulfate-Mag Sulfate-KCl (SUTAB) [phone] MG TABS Take 1 kit by mouth once for 1 dose. Marland KitchenMANUFACTURER CODES!! BIN: [id number] PCN: CN GROUP: [id number] MEMBER ID: [id number];SWN AS SECONDARY INSURANCE ;NO PRIOR AUTHORIZATION 24 tablet 0  . triamcinolone cream (KENALOG) 0.1 % Apply 1 application topically 2 (two) times daily. Left lower leg bid prn 80 g 0  . vitamin C (ASCORBIC ACID) 500 MG tablet Take 500 mg by mouth daily.     No current facility-administered medications for this visit.   Allergies  Allergen Reactions  . Hctz [Hydrochlorothiazide]     Low na 124   . Januvia [Sitagliptin] Diarrhea  . Sulfa Antibiotics   . Losartan Rash     Review of Systems: All systems reviewed and negative except where noted in HPI.    Labs per HPI and reviewed in care everywhere  Physical Exam: BP (!) 148/88   Pulse 75   Ht $R'5\' 1"'mE$  (1.549 m)   Wt 174 lb (78.9 kg)   BMI 32.88 kg/m  Constitutional: Pleasant,well-developed, female in no acute distress. HEENT: Normocephalic and atraumatic. Conjunctivae are normal. No scleral icterus. Neck supple.  Cardiovascular: Normal rate, regular rhythm.  Pulmonary/chest: Effort normal and breath sounds normal.  Abdominal: Soft, nondistended, nontender.  There are no masses palpable.  DRE / Anoscopy - [facility] [place] standby - no perianal  abnormalities, no mass lesions, (+) internal hemorrhoids noted Extremities: no edema Lymphadenopathy: No cervical adenopathy noted. Neurological: Alert and oriented to person place and time. Skin: Skin is warm and dry. No rashes noted. Psychiatric: Normal mood and affect. Behavior is normal.   ASSESSMENT AND PLAN: 70 year old female here for new patient assessment of the following:  Rectal bleeding / Internal  hemorrhoids / Altered bowel habits - symptoms as outlined above, now with constipation and periodic rectal bleeding as described.  Anoscopy today does show internal hemorrhoids and this very well may be the source of her symptoms however given her persistent bleeding symptoms and her last colonoscopy was over 8 years ago, recommend optical colonoscopy to ensure no other pathology and clear her colon.  I discussed risks and benefits of colonoscopy and anesthesia with her and she wants to proceed.  In the interim recommend trial of a daily fiber supplement to help keep stools soft and treat constipation.  She did not really like the way MiraLAX made her feel previously.  I gave her a free sample of Fiber Choice chewable tablets to see if she tolerates those okay she can use that for Metamucil/Citrucel or what ever fiber supplement she wishes to use.  I also provided her a free sample of Calmol #4 suppositories to reduce irritation from the hemorrhoids and see if that will help reduce her bleeding as well.  Further recommendations pending her course and results of her colonoscopy.  She agreed  GERD / epigastric burning - I suspect her upper tract symptoms are more than likely related to reflux, she had a good response to Tums and Rolaids.  Given her CKD would want to avoid PPI if possible.  We will try her on Pepcid 20 mg a day for the next 4 weeks and see if this provides some benefit.  I will also screen her for H. pylori with an IgG serology and treat if positive.  She has no alarm symptoms and do  not feel strongly she needs an EGD right now, can consider that if symptoms persist over time.  She agreed  Stockholm Cellar, MD [facility]  CC: [name], Olivia Mackie *

## 2020-07-14 ENCOUNTER — Other Ambulatory Visit: Payer: Self-pay

## 2020-07-14 DIAGNOSIS — A048 Other specified bacterial intestinal infections: Secondary | ICD-10-CM

## 2020-07-14 MED ORDER — OMEPRAZOLE 40 MG PO CPDR: 40.0000 mg | DELAYED_RELEASE_CAPSULE | Freq: Two times a day (BID) | ORAL | 0 refills | Status: DC

## 2020-07-14 MED ORDER — CLARITHROMYCIN 500 MG PO TABS: 500.0000 mg | ORAL_TABLET | Freq: Two times a day (BID) | ORAL | 0 refills | Status: AC

## 2020-07-14 MED ORDER — AMOXICILLIN 500 MG PO TABS: 1000.0000 mg | ORAL_TABLET | Freq: Two times a day (BID) | ORAL | 0 refills | Status: AC

## 2020-07-14 MED ORDER — METRONIDAZOLE 500 MG PO TABS: 500.0000 mg | ORAL_TABLET | Freq: Two times a day (BID) | ORAL | 0 refills | Status: AC

## 2020-07-21 ENCOUNTER — Encounter: Payer: Medicare Other | Admitting: Gastroenterology

## 2020-07-27 ENCOUNTER — Ambulatory Visit: Payer: Medicare Other | Admitting: Pharmacist

## 2020-07-27 DIAGNOSIS — N1831 Chronic kidney disease, stage 3a: Secondary | ICD-10-CM

## 2020-07-27 DIAGNOSIS — E119 Type 2 diabetes mellitus without complications: Secondary | ICD-10-CM

## 2020-07-27 DIAGNOSIS — I152 Hypertension secondary to endocrine disorders: Secondary | ICD-10-CM

## 2020-07-27 DIAGNOSIS — E1159 Type 2 diabetes mellitus with other circulatory complications: Secondary | ICD-10-CM

## 2020-07-27 NOTE — Patient Instructions (Signed)
Natasha Phillips,   It was great talking with you today!  Let's try holding the Onglyza, because I am not sure you still need it. Continue metformin 1000 mg daily. Check your blood sugars fasting 2 hours after the biggest meal of your day. Our goal for an A1c of <7% is fasting sugars <130 and 2 hour after meal sugars <180.   Keep up the fantastic work with your dietary choices and your exercise.   Call me with any questions or concerns!  [name], PharmD [phone]   Visit Information  Patient Care Plan: Medication Management    Problem Identified: Diabetes,     Long-Range Goal: Disease Progression Prevention   Start Date: 07/27/2020  This Visit's Progress: On track  Priority: High  Note:   Current Barriers:  . Unable to independently afford treatment regimen  Pharmacist Clinical Goal(s):  Marland Kitchen Over the next 90 days, patient will verbalize ability to afford treatment regimen through collaboration with PharmD and provider.   Interventions: . 1:1 collaboration with [name], Nino Glow, MD regarding development and update of comprehensive plan of care as evidenced by provider attestation and co-signature . Inter-disciplinary care team collaboration (see longitudinal plan of care) . Comprehensive medication review performed; medication list updated in electronic medical record   Diabetes: . Controlled; current treatment: metformin 1000 mg daily, Onglyza 2.5 mg daily; multiple PA denied for Onglyza, Januvia and Tradjenta preferred o Significant diarrhea with Januvia.  . Current glucose readings: fasting glucose: 80-110s post prandial glucose: not checking . Denies/reports hypoglycemic/hyperglycemic symptoms . Current meal patterns: breakfast: oatmeal with some fruit; focused on Mediterranean Diet- high in vegetables, small portions of meat, rice, sweet potatoes; snacks: corn chips, popcorn, honey graham crackers; drinks: water; tries to avoid high acid foods  . Current  exercise: walks 30 minutes daily. Has a treadmill.  . Counseled on goal A1c, goal fasting, and goal 2 hour post prandial glucose.  Marland Kitchen Discussed Onglyza patient assistance program. Patient is over income for patient assistance.  . Discussed two options: hold Onglyza and monitor sugars vs stop Onglyza and try samples of Tradjenta. Patient elects to hold Onglyza and monitor sugars. Given current diet and exercise regimen and very well controlled sugars, as well as generally low glycemic benefit of Onglyza, anticipate sugars will remain well controlled.  . Recommend to continue metformin 1000 mg daily. Discussed that if glucose readings trend up, we could increase metformin dose. She verbalizes understanding   Hypertension: . Controlled; current treatment; olmesartan 5 mg daily, furosemide 20 mg PRN edema . Current home readings: ~120-140s/80s . Recommend to continue current regimen at this time  ASCVD risk reduction . Controlled; current lipid treatment: none . 10 year ASCVD risk score: unable to be calculated d/t well controlled lipids; LDL 55, TG 44, TC 128 . Current dietary patterns: following a mediterranean diet - lean meats, high in fruits and vegetables.  . Antiplatelet regimen: aspirin 81 mg daily  . Given LDL at goal 55 without therapy, appropriate to defer statin therapy at this time.   H Pylori/GERD: . Completing treatment; amoxicillin 1000 mg BID, clarithromycin 500 mg BID, metronidazole 500 mg BID, omeprazole 40 mg BID; takes famotidine otherwise for GERD. Follows w/ GI Dr. Havery Moros.  . Continue current regimen along with collaboration with GI  Allergies: . Controlled at this time of the year without therapy; current regimen: loratidine 10 mg daily PRN, fluticasone intranasal PRN . Continue to monitor for need to re-introduce allergy medications  Supplements: Marland Kitchen Vitamin C +  Zinc, Multivitamin, Magnesium . No interactions noted. Continue current regimen  Patient Goals/Self-Care  Activities . Over the next 90 days, patient will:  - take medications as prescribed check glucose BID, document, and provide at future appointments target a minimum of 150 minutes of moderate intensity exercise weekly  Follow Up Plan: Telephone follow up appointment with care management team member scheduled for: ~8 weeks       Ms. Chesler was given information about Chronic Care Management services today including:  1. CCM service includes personalized support from designated clinical staff supervised by her physician, including individualized plan of care and coordination with other care providers 2. 24/7 contact phone numbers for assistance for urgent and routine care needs. 3. Service will only be billed when office clinical staff spend 20 minutes or more in a month to coordinate care. 4. Only one practitioner may furnish and bill the service in a calendar month. 5. The patient may stop CCM services at any time (effective at the end of the month) by phone call to the office staff. 6. The patient will be responsible for cost sharing (co-pay) of up to 20% of the service fee (after annual deductible is met).  Patient agreed to services and verbal consent obtained.   The patient verbalized understanding of instructions, educational materials, and care plan provided today and agreed to receive a mailed copy of patient instructions, educational materials, and care plan.  Plan: Telephone follow up appointment with care management team member scheduled for:  ~ 8 weeks  [name], PharmD, [place], [place] Clinical Pharmacist [employer] [facility] [employer] [phone]

## 2020-07-27 NOTE — Chronic Care Management (AMB) (Signed)
Chronic Care Management Pharmacy Note  07/27/2020 Name:  Natasha Phillips MRN:  [id number] DOB:  1951-05-08  Subjective: Natasha Phillips is an 70 y.o. year old female who is a primary patient of [name], Nino Glow, MD.  The CCM team was consulted for assistance with disease management and care coordination needs.    Engaged with patient by telephone for initial visit in response to provider referral for pharmacy case management and/or care coordination services.   Consent to Services:  The patient was given the following information about Chronic Care Management services today, agreed to services, and gave verbal consent: 1. CCM service includes personalized support from designated clinical staff supervised by the primary care provider, including individualized plan of care and coordination with other care providers 2. 24/7 contact phone numbers for assistance for urgent and routine care needs. 3. Service will only be billed when office clinical staff spend 20 minutes or more in a month to coordinate care. 4. Only one practitioner may furnish and bill the service in a calendar month. 5.The patient may stop CCM services at any time (effective at the end of the month) by phone call to the office staff. 6. The patient will be responsible for cost sharing (co-pay) of up to 20% of the service fee (after annual deductible is met). Patient agreed to services and consent obtained.  Objective:  Lab Results  Component Value Date   CREATININE 1.09 08/06/2019   CREATININE 1.06 (H) 07/12/2019   CREATININE 1.17 (H) 05/03/2019    Lab Results  Component Value Date   HGBA1C 6.5 (A) 03/28/2020       Component Value Date/Time   CHOL 128 08/11/2019 0834   TRIG 44 08/11/2019 0834   HDL 62 08/11/2019 0834   CHOLHDL 2.1 08/11/2019 0834   LDLCALC 55 08/11/2019 0834    Clinical ASCVD: No  The ASCVD Risk score (Goff DC Jr., et al., 2013) failed to calculate for the following reasons:   The valid  total cholesterol range is 130 to 320 mg/dL     BP Readings from Last 3 Encounters:  07/13/20 (!) 148/88  06/23/20 128/81  06/12/20 (!) 166/82    Assessment: Review of patient past medical history, allergies, medications, health status, including review of consultants reports, laboratory and other test data, was performed as part of comprehensive evaluation and provision of chronic care management services.   SDOH:  (Social Determinants of Health) assessments and interventions performed:  SDOH Interventions   Flowsheet Row Most Recent Value  SDOH Interventions   Food Insecurity Interventions Intervention Not Indicated  Financial Strain Interventions Intervention Not Indicated  Physical Activity Interventions Intervention Not Indicated      CCM Care Plan  Allergies  Allergen Reactions  . Hctz [Hydrochlorothiazide]     Low na 124   . Januvia [Sitagliptin] Diarrhea  . Sulfa Antibiotics   . Losartan Rash    Medications Reviewed Today    Reviewed by De Hollingshead, RPH-CPP (Pharmacist) on 07/27/20 at 1314  Med List Status: <None>  Medication Order Taking? Sig Documenting Provider Last Dose Status Informant  amoxicillin (AMOXIL) 500 MG tablet [id number] Yes Take 2 tablets (1,000 mg total) by mouth 2 (two) times daily for 14 days. Yetta Flock, MD Taking Active   aspirin EC 81 MG tablet [id number] Yes Take 81 mg by mouth daily. [provider] Taking Active   clarithromycin (BIAXIN) 500 MG tablet [id number] Yes Take 1 tablet (500 mg total) by mouth 2 (two)  times daily for 14 days. Yetta Flock, MD Taking Active   famotidine (PEPCID) 20 MG tablet [id number] Yes Take 1 tablet (20 mg total) by mouth daily. Yetta Flock, MD Taking Active   fluticasone [facility]) 50 MCG/ACT nasal spray [id number] Yes Place 2 sprays into both nostrils daily. [name], Nino Glow, MD Taking Active   furosemide (LASIX) 20 MG tablet [id number] Yes Take 1 tablet (20 mg  total) by mouth daily as needed for fluid or edema. Fenton Malling M, PA-C Taking Active   hydrocortisone (ANUSOL-HC) 2.5 % rectal cream [id number] No Place 1 application rectally 3 (three) times daily.  Patient not taking: Reported on 07/27/2020   [name], Nino Glow, MD Not Taking Active   Inulin (FIBER CHOICE FRUITY BITES) 1.5 g CHEW [id number] Yes Take as directed Armbruster, Carlota Raspberry, MD Taking Active   loratadine (CLARITIN) 10 MG tablet [id number] Yes Take 10 mg by mouth daily as needed for allergies. [provider] Taking Active   magnesium oxide (MAG-OX) 400 MG tablet [id number] Yes Take 400 mg by mouth daily. [provider] Taking Active   metFORMIN (GLUCOPHAGE) 1000 MG tablet [id number] Yes Take 1 tablet (1,000 mg total) by mouth daily. [name], Nino Glow, MD Taking Active   metroNIDAZOLE (FLAGYL) 500 MG tablet [id number] Yes Take 1 tablet (500 mg total) by mouth 2 (two) times daily for 14 days. Yetta Flock, MD Taking Active   Multiple Vitamin (MULTIVITAMIN WITH MINERALS) TABS tablet [id number] Yes Take 1 tablet by mouth daily. [provider] Taking Active Self  olmesartan (BENICAR) 5 MG tablet [id number] Yes Take 5 mg by mouth. [provider] Taking Active   omeprazole (PRILOSEC) 40 MG capsule [id number] Yes Take 1 capsule (40 mg total) by mouth in the morning and at bedtime for 14 days. Yetta Flock, MD Taking Active   ONGLYZA 2.5 MG TABS tablet [id number] Yes Take 1 tablet (2.5 mg total) by mouth daily. [place] caused diarrhea [name], Nino Glow, MD Taking Active   Rectal Protectant-Emollient (CALMOL-4) 76-10 % SUPP [id number] Yes Take as directed Armbruster, Carlota Raspberry, MD Taking Active   triamcinolone cream (KENALOG) 0.1 % [id number] No Apply 1 application topically 2 (two) times daily. Left lower leg bid prn  Patient not taking: Reported on 07/27/2020   [name], Nino Glow, MD Not Taking Active   vitamin C (ASCORBIC  ACID) 500 MG tablet [id number] Yes Take 500 mg by mouth daily. [provider] Taking Active Self          Patient Active Problem List   Diagnosis Date Noted  . Hypertension associated with diabetes ([place]) 05/19/2020  . Scoliosis of thoracolumbar spine 05/19/2020  . Facet hypertrophy of lumbar region 05/19/2020  . Cervical arthritis 11/26/2019  . Benign hypertensive kidney disease with chronic kidney disease 11/15/2019  . Stage 3a chronic kidney disease ([place]) 11/15/2019  . Hypertensive chronic kidney disease with stage 1 through stage 4 chronic kidney disease, or unspecified chronic kidney disease 11/15/2019  . Pain in limb 11/12/2019  . Neck pain 08/24/2019  . Disorder of both eustachian tubes 08/24/2019  . Leg cramps 08/09/2019  . CKD (chronic kidney disease) stage 2, GFR 60-89 ml/min 08/03/2019  . Leg edema 07/22/2019  . Hyponatremia 04/17/2019  . Gastroesophageal reflux disease 09/16/2018  . Erosion of oral mucosa 10/28/2016  . Thyroid nodule 10/14/2015  . Abnormal cervical Papanicolaou smear 01/11/2015  . Absolute anemia 01/11/2015  . Mild mitral insufficiency 01/11/2015  . CN (constipation) 01/11/2015  . Calcium  blood increased 01/11/2015  . LBP (low back pain) 01/11/2015  . Obesity (BMI 30-39.9) 01/11/2015  . Leg paresthesia 01/11/2015  . Neuralgia neuritis, sciatic nerve 01/11/2015  . Avitaminosis D 01/11/2015  . Essential hypertension 11/22/2014  . Controlled type 2 diabetes mellitus without complication ([place]) 71/24/5809    Conditions to be addressed/monitored: HTN and DMII  Care Plan : Medication Management  Updates made by De Hollingshead, RPH-CPP since 07/27/2020 12:00 AM    Problem: Diabetes,     Long-Range Goal: Disease Progression Prevention   Start Date: 07/27/2020  This Visit's Progress: On track  Priority: High  Note:   Current Barriers:  . Unable to independently afford treatment regimen  Pharmacist Clinical Goal(s):  Marland Kitchen Over the next  90 days, patient will verbalize ability to afford treatment regimen through collaboration with PharmD and provider.   Interventions: . 1:1 collaboration with [name], Nino Glow, MD regarding development and update of comprehensive plan of care as evidenced by provider attestation and co-signature . Inter-disciplinary care team collaboration (see longitudinal plan of care) . Comprehensive medication review performed; medication list updated in electronic medical record   Diabetes: . Controlled; current treatment: metformin 1000 mg daily, Onglyza 2.5 mg daily; multiple PA denied for Onglyza, Januvia and Tradjenta preferred o Significant diarrhea with Januvia.  . Current glucose readings: fasting glucose: 80-110s post prandial glucose: not checking . Denies/reports hypoglycemic/hyperglycemic symptoms . Current meal patterns: breakfast: oatmeal with some fruit; focused on Mediterranean Diet- high in vegetables, small portions of meat, rice, sweet potatoes; snacks: corn chips, popcorn, honey graham crackers; drinks: water; tries to avoid high acid foods  . Current exercise: walks 30 minutes daily. Has a treadmill.  . Counseled on goal A1c, goal fasting, and goal 2 hour post prandial glucose.  Marland Kitchen Discussed Onglyza patient assistance program. Patient is over income for patient assistance.  . Discussed two options: hold Onglyza and monitor sugars vs stop Onglyza and try samples of Tradjenta. Patient elects to hold Onglyza and monitor sugars. Given current diet and exercise regimen and very well controlled sugars, as well as generally low glycemic benefit of Onglyza, anticipate sugars will remain well controlled.  . Recommend to continue metformin 1000 mg daily. Discussed that if glucose readings trend up, we could increase metformin dose. She verbalizes understanding   Hypertension: . Controlled; current treatment; olmesartan 5 mg daily, furosemide 20 mg PRN edema . Current home readings:  ~120-140s/80s . Recommend to continue current regimen at this time  ASCVD risk reduction . Controlled; current lipid treatment: none . 10 year ASCVD risk score: unable to be calculated d/t well controlled lipids; LDL 55, TG 44, TC 128 . Current dietary patterns: following a mediterranean diet - lean meats, high in fruits and vegetables.  . Antiplatelet regimen: aspirin 81 mg daily  . Given LDL at goal 55 without therapy, appropriate to defer statin therapy at this time.   H Pylori/GERD: . Completing treatment; amoxicillin 1000 mg BID, clarithromycin 500 mg BID, metronidazole 500 mg BID, omeprazole 40 mg BID; takes famotidine otherwise for GERD. Follows w/ GI Dr. Havery Moros.  . Continue current regimen along with collaboration with GI  Allergies: . Controlled at this time of the year without therapy; current regimen: loratidine 10 mg daily PRN, fluticasone intranasal PRN . Continue to monitor for need to re-introduce allergy medications  Supplements: Marland Kitchen Vitamin C + Zinc, Multivitamin, Magnesium . No interactions noted. Continue current regimen  Patient Goals/Self-Care Activities . Over the next 90 days, patient will:  -  take medications as prescribed check glucose BID, document, and provide at future appointments target a minimum of 150 minutes of moderate intensity exercise weekly  Follow Up Plan: Telephone follow up appointment with care management team member scheduled for: ~8 weeks       Medication Assistance: None required.  Patient affirms current coverage meets needs.  Follow Up:  Patient agrees to Care Plan and Follow-up.  Plan: Telephone follow up appointment with care management team member scheduled for:  ~ 8 weeks  [name], PharmD, [place], [place] Clinical Pharmacist [employer] at [employer] [phone]

## 2020-07-28 ENCOUNTER — Ambulatory Visit: Payer: Medicare Other | Admitting: Internal Medicine

## 2020-08-09 ENCOUNTER — Ambulatory Visit
Admission: RE | Admit: 2020-08-09 | Discharge: 2020-08-09 | Disposition: A | Discharge status: Home / Self Care | Payer: Medicare Other | Source: Ambulatory Visit | Attending: Internal Medicine | Admitting: Internal Medicine

## 2020-08-09 ENCOUNTER — Other Ambulatory Visit: Payer: Self-pay

## 2020-08-09 DIAGNOSIS — Z780 Asymptomatic menopausal state: Secondary | ICD-10-CM | POA: Diagnosis not present

## 2020-08-09 DIAGNOSIS — E2839 Other primary ovarian failure: Secondary | ICD-10-CM

## 2020-08-09 DIAGNOSIS — Z1231 Encounter for screening mammogram for malignant neoplasm of breast: Secondary | ICD-10-CM | POA: Insufficient documentation

## 2020-08-14 ENCOUNTER — Telehealth: Payer: Self-pay | Admitting: Gastroenterology

## 2020-08-14 NOTE — Telephone Encounter (Signed)
Patient is calling to follow up on the medication given for H pylori states she still has the bacteria seeking advise

## 2020-08-14 NOTE — Telephone Encounter (Signed)
Spoke with patient, she states that began treatment for H. Pylori on 07/15/20 but still feels like she has the bacteria. Advised that 1 month after the completed treatment we will have her come in for a stool study to make sure that the bacteria has been completely cleared. Patient verbalized understanding of this information and had no concerns at the end of the call.

## 2020-09-01 ENCOUNTER — Ambulatory Visit (INDEPENDENT_AMBULATORY_CARE_PROVIDER_SITE_OTHER): Payer: Medicare Other | Admitting: Internal Medicine

## 2020-09-01 ENCOUNTER — Other Ambulatory Visit: Payer: Self-pay

## 2020-09-01 ENCOUNTER — Encounter: Payer: Self-pay | Admitting: Internal Medicine

## 2020-09-01 VITALS — BP 160/94 | HR 85 | Temp 98.2°F | Ht 61.0 in | Wt 168.2 lb

## 2020-09-01 DIAGNOSIS — R202 Paresthesia of skin: Secondary | ICD-10-CM | POA: Diagnosis not present

## 2020-09-01 DIAGNOSIS — L22 Diaper dermatitis: Secondary | ICD-10-CM

## 2020-09-01 DIAGNOSIS — I10 Essential (primary) hypertension: Secondary | ICD-10-CM

## 2020-09-01 DIAGNOSIS — M1990 Unspecified osteoarthritis, unspecified site: Secondary | ICD-10-CM | POA: Diagnosis not present

## 2020-09-01 MED ORDER — OLMESARTAN MEDOXOMIL 20 MG PO TABS: 20.0000 mg | ORAL_TABLET | Freq: Every day | ORAL | 3 refills | Status: DC

## 2020-09-01 NOTE — Progress Notes (Signed)
Patient states that her blood pressure has been running high and she has been "woozy". States the Bp at home has been 150-160s/ 100s.   Patient state she has been having burning sensations in the hands and back of her legs. Rated this a 7/10.   Patient is also having burning in the perineum, the are between her vagina and anus. States she has not noticed any changes in her urination, no discharge or itching.

## 2020-09-01 NOTE — Patient Instructions (Addendum)
If needed we can add back norvasc 2.5 mg daily  Let me over the next 2 weeks   Desitin/A&D/Buttpaste (zinc oxide, vasoline, hydrocortisone/ + antifungal clotrimazole or lamsil)   Use dove unscented soap or vagisil to private area not zest/olay please  Debrox ear wax drops use to right ear    Hypertension, Adult High blood pressure (hypertension) is when the force of blood pumping through the arteries is too strong. The arteries are the blood vessels that carry blood from the heart throughout the body. Hypertension forces the heart to work harder to pump blood and may cause arteries to become narrow or stiff. Untreated or uncontrolled hypertension can cause a heart attack, heart failure, a stroke, kidney disease, and other problems. A blood pressure reading consists of a higher number over a lower number. Ideally, your blood pressure should be below 120/80. The first ("top") number is called the systolic pressure. It is a measure of the pressure in your arteries as your heart beats. The second ("bottom") number is called the diastolic pressure. It is a measure of the pressure in your arteries as the heart relaxes. What are the causes? The exact cause of this condition is not known. There are some conditions that result in or are related to high blood pressure. What increases the risk? Some risk factors for high blood pressure are under your control. The following factors may make you more likely to develop this condition:  Smoking.  Having type 2 diabetes mellitus, high cholesterol, or both.  Not getting enough exercise or physical activity.  Being overweight.  Having too much fat, sugar, calories, or salt (sodium) in your diet.  Drinking too much alcohol. Some risk factors for high blood pressure may be difficult or impossible to change. Some of these factors include:  Having chronic kidney disease.  Having a family history of high blood pressure.  Age. Risk increases with  age.  Race. You may be at higher risk if you are African American.  Gender. Men are at higher risk than women before age 72. After age 19, women are at higher risk than men.  Having obstructive sleep apnea.  Stress. What are the signs or symptoms? High blood pressure may not cause symptoms. Very high blood pressure (hypertensive crisis) may cause:  Headache.  Anxiety.  Shortness of breath.  Nosebleed.  Nausea and vomiting.  Vision changes.  Severe chest pain.  Seizures. How is this diagnosed? This condition is diagnosed by measuring your blood pressure while you are seated, with your arm resting on a flat surface, your legs uncrossed, and your feet flat on the floor. The cuff of the blood pressure monitor will be placed directly against the skin of your upper arm at the level of your heart. It should be measured at least twice using the same arm. Certain conditions can cause a difference in blood pressure between your right and left arms. Certain factors can cause blood pressure readings to be lower or higher than normal for a short period of time:  When your blood pressure is higher when you are in a health care provider's office than when you are at home, this is called white coat hypertension. Most people with this condition do not need medicines.  When your blood pressure is higher at home than when you are in a health care provider's office, this is called masked hypertension. Most people with this condition may need medicines to control blood pressure. If you have a high blood pressure  reading during one visit or you have normal blood pressure with other risk factors, you may be asked to:  Return on a different day to have your blood pressure checked again.  Monitor your blood pressure at home for 1 week or longer. If you are diagnosed with hypertension, you may have other blood or imaging tests to help your health care provider understand your overall risk for other  conditions. How is this treated? This condition is treated by making healthy lifestyle changes, such as eating healthy foods, exercising more, and reducing your alcohol intake. Your health care provider may prescribe medicine if lifestyle changes are not enough to get your blood pressure under control, and if:  Your systolic blood pressure is above 130.  Your diastolic blood pressure is above 80. Your personal target blood pressure may vary depending on your medical conditions, your age, and other factors. Follow these instructions at home: Eating and drinking  Eat a diet that is high in fiber and potassium, and low in sodium, added sugar, and fat. An example eating plan is called the DASH (Dietary Approaches to Stop Hypertension) diet. To eat this way: ? Eat plenty of fresh fruits and vegetables. Try to fill one half of your plate at each meal with fruits and vegetables. ? Eat whole grains, such as whole-wheat pasta, brown rice, or whole-grain bread. Fill about one fourth of your plate with whole grains. ? Eat or drink low-fat dairy products, such as skim milk or low-fat yogurt. ? Avoid fatty cuts of meat, processed or cured meats, and poultry with skin. Fill about one fourth of your plate with lean proteins, such as fish, chicken without skin, beans, eggs, or tofu. ? Avoid pre-made and processed foods. These tend to be higher in sodium, added sugar, and fat.  Reduce your daily sodium intake. Most people with hypertension should eat less than 1,500 mg of sodium a day.  Do not drink alcohol if: ? Your health care provider tells you not to drink. ? You are pregnant, may be pregnant, or are planning to become pregnant.  If you drink alcohol: ? Limit how much you use to:  0-1 drink a day for women.  0-2 drinks a day for men. ? Be aware of how much alcohol is in your drink. In the [place] one drink equals one 12 oz bottle of beer (355 mL), one 5 oz glass of wine (148 mL), or one 1 oz glass  of hard liquor (44 mL).   Lifestyle  Work with your health care provider to maintain a healthy body weight or to lose weight. Ask what an ideal weight is for you.  Get at least 30 minutes of exercise most days of the week. Activities may include walking, swimming, or biking.  Include exercise to strengthen your muscles (resistance exercise), such as Pilates or lifting weights, as part of your weekly exercise routine. Try to do these types of exercises for 30 minutes at least 3 days a week.  Do not use any products that contain nicotine or tobacco, such as cigarettes, e-cigarettes, and chewing tobacco. If you need help quitting, ask your health care provider.  Monitor your blood pressure at home as told by your health care provider.  Keep all follow-up visits as told by your health care provider. This is important.   Medicines  Take over-the-counter and prescription medicines only as told by your health care provider. Follow directions carefully. Blood pressure medicines must be taken as prescribed.  Do  not skip doses of blood pressure medicine. Doing this puts you at risk for problems and can make the medicine less effective.  Ask your health care provider about side effects or reactions to medicines that you should watch for. Contact a health care provider if you:  Think you are having a reaction to a medicine you are taking.  Have headaches that keep coming back (recurring).  Feel dizzy.  Have swelling in your ankles.  Have trouble with your vision. Get help right away if you:  Develop a severe headache or confusion.  Have unusual weakness or numbness.  Feel faint.  Have severe pain in your chest or abdomen.  Vomit repeatedly.  Have trouble breathing. Summary  Hypertension is when the force of blood pumping through your arteries is too strong. If this condition is not controlled, it may put you at risk for serious complications.  Your personal target blood pressure  may vary depending on your medical conditions, your age, and other factors. For most people, a normal blood pressure is less than 120/80.  Hypertension is treated with lifestyle changes, medicines, or a combination of both. Lifestyle changes include losing weight, eating a healthy, low-sodium diet, exercising more, and limiting alcohol. This information is not intended to replace advice given to you by your health care provider. Make sure you discuss any questions you have with your health care provider. Document Revised: 03/04/2018 Document Reviewed: 03/04/2018 Elsevier Patient Education  2021 [employer].

## 2020-09-01 NOTE — Progress Notes (Signed)
Chief Complaint  Patient presents with  . Hypertension  . perineum burning    F/u  1. HTN with CKD 3a Patient states that her blood pressure has been running high and she has been "woozy". States the Bp at home has been 150-160s/ 100s. On olmesartan 5 mg qd when was on 20 mg qd BP better controlled  2. Patient state she has been having burning sensations in the hands and back of her legs. Rated this a 7/10. appt neurology Dr. Sherryll Burger 09/27/20 will CC him rec EMG/NCG she has arthritis neck, mid back and low back will see if CTS, neuropathy vs referred from spine  3  Patient is also having skin burning in the perineum, the are between her vagina and anus. States she has not noticed any changes in her urination, no discharge or itching. Using olay or zest soap and vagisil powder in this area    Review of Systems  Constitutional: Negative for weight loss.  HENT: Negative for hearing loss.   Eyes: Negative for blurred vision.  Respiratory: Negative for shortness of breath.   Cardiovascular: Negative for chest pain.  Gastrointestinal: Negative for abdominal pain.  Musculoskeletal: Negative for falls and joint pain.  Skin: Positive for rash.  Neurological: Positive for dizziness.  Psychiatric/Behavioral: Negative for memory loss.   Past Medical History:  Diagnosis Date  . Anemia   . CKD (chronic kidney disease)   . Diabetes mellitus without complication (HCC)   . Glaucoma    [facility] with macular edema and Dr. Frederico Hamman sees Q3-4 months  . Hypertension    Past Surgical History:  Procedure Laterality Date  . BIOPSY THYROID    . CESAREAN SECTION     1981 1988  . TUBAL LIGATION     Family History  Problem Relation Age of Onset  . Diabetes Mother   . Heart failure Mother   . Diabetes Sister   . Healthy Sister   . Healthy Sister   . Healthy Sister   . Diabetes Brother   . Diabetes Sister   . Heart failure Father   . Brain cancer Brother   . Throat cancer Brother   . Glaucoma  Other        Runs on Paternal Side  . Breast cancer Cousin    Social History   Socioeconomic History  . Marital status: Widowed    Spouse name: Not on file  . Number of children: 2  . Years of education: College  . Highest education level: Not on file  Occupational History  . 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 Smoking status: Never Smoker  . Smokeless tobacco: Never Used  Vaping Use  . Vaping Use: Never used  Substance and Sexual Activity  . Alcohol use: No  . Drug use: No  . Sexual activity: Not Currently    Birth control/protection: None  Other Topics Concern  . Not on file  Social History Narrative   Lives at home    Social Determinants of Health   Financial Resource Strain: Low Risk   . Difficulty of Paying Living Expenses: Not hard at all  Food Insecurity: No Food Insecurity  . Worried About Programme researcher, broadcasting/film/video in the Last Year: Never true  . Ran Out of Food in the Last Year: Never true  Transportation Needs: Not on file  Physical Activity: Sufficiently Active  . Days of Exercise per Week: 7 days  .  Minutes of Exercise per Session: 30 min  Stress: Not on file  Social Connections: Not on file  Intimate Partner Violence: Not on file   Current Meds  Medication Sig  . aspirin EC 81 MG tablet Take 81 mg by mouth daily.  . famotidine (PEPCID) 20 MG tablet Take 1 tablet (20 mg total) by mouth daily.  . fluticasone (FLONASE) 50 MCG/ACT nasal spray Place 2 sprays into both nostrils daily.  . furosemide (LASIX) 20 MG tablet Take 1 tablet (20 mg total) by mouth daily as needed for fluid or edema.  . Inulin (FIBER CHOICE FRUITY BITES) 1.5 g CHEW Take as directed  . loratadine (CLARITIN) 10 MG tablet Take 10 mg by mouth daily as needed for allergies.  . magnesium oxide (MAG-OX) 400 MG tablet Take 400 mg by mouth daily.  . metFORMIN (GLUCOPHAGE) 1000 MG tablet Take 1 tablet (1,000 mg total) by mouth daily.  . Multiple Vitamin  (MULTIVITAMIN WITH MINERALS) TABS tablet Take 1 tablet by mouth daily.  . ONGLYZA 2.5 MG TABS tablet Take 1 tablet (2.5 mg total) by mouth daily. januvia caused diarrhea  . Rectal Protectant-Emollient (CALMOL-4) 76-10 % SUPP Take as directed  . ZINC-VITAMIN C PO Take 1 tablet by mouth daily.  . [DISCONTINUED] olmesartan (BENICAR) 5 MG tablet Take 5 mg by mouth.   Allergies  Allergen Reactions  . Coreg [Carvedilol]     Could not tolerate    . Hctz [Hydrochlorothiazide]     Low na 124   . Januvia [Sitagliptin] Diarrhea  . Sulfa Antibiotics   . Losartan Rash   Recent Results (from the past 2160 hour(s))  H. pylori antibody, IgG     Status: Abnormal   Collection Time: 07/13/20  9:43 AM  Result Value Ref Range   H Pylori IgG Positive (A) Negative   Objective  Body mass index is 31.78 kg/m. Wt Readings from Last 3 Encounters:  09/01/20 168 lb 3.2 oz (76.3 kg)  07/13/20 174 lb (78.9 kg)  06/23/20 171 lb (77.6 kg)   Temp Readings from Last 3 Encounters:  09/01/20 98.2 F (36.8 C) (Oral)  05/17/20 97.8 F (36.6 C) (Oral)  04/04/20 99.3 F (37.4 C) (Oral)   BP Readings from Last 3 Encounters:  09/01/20 (!) 160/94  07/13/20 (!) 148/88  06/23/20 128/81   Pulse Readings from Last 3 Encounters:  09/01/20 85  07/13/20 75  06/23/20 78    Physical Exam Vitals and nursing note reviewed.  Constitutional:      Appearance: Normal appearance. She is well-developed and well-groomed. She is obese.  HENT:     Head: Normocephalic and atraumatic.  Eyes:     Conjunctiva/sclera: Conjunctivae normal.     Pupils: Pupils are equal, round, and reactive to light.  Cardiovascular:     Rate and Rhythm: Normal rate and regular rhythm.     Heart sounds: Normal heart sounds. No murmur heard.   Pulmonary:     Effort: Pulmonary effort is normal.     Breath sounds: Normal breath sounds.  Genitourinary:    Pubic Area: Rash present.     Vagina: No vaginal discharge.     Comments: Diaper  dermatitis perinuem to anus area likely contact I.e soap etiology Skin:    General: Skin is warm and dry.  Neurological:     General: No focal deficit present.     Mental Status: She is alert and oriented to person, place, and time. Mental status is at baseline.  Gait: Gait normal.  Psychiatric:        Attention and Perception: Attention and perception normal.        Mood and Affect: Mood and affect normal.        Speech: Speech normal.        Behavior: Behavior normal. Behavior is cooperative.        Thought Content: Thought content normal.        Cognition and Memory: Cognition and memory normal.        Judgment: Judgment normal.     Assessment  Plan  Hypertension, unspecified type - Plan: olmesartan (BENICAR) 20 MG tablet If needed we can add back norvasc 2.5 mg daily  Let me over the next 2 weeks  CC Dr. Ronn Melena renal updates above   Arthritis (cervical, T and L spine) Not sx'matic   Diaper dermatitis Desitin/A&D/Buttpaste (zinc oxide, vasoline, hydrocortisone/ + antifungal clotrimazole or lamsil)  Use dove unscented soap or vagisil to private area not zest/olay please    Paresthesia  -consider EMG/NCS b/l arms and legs with neurology Dr. Sherryll Burger appt 09/27/20  Cc'ed Dr. Sherryll Burger   HM Flu shot declines prevnar utd  pna 23utd no need to updatedue to age per guidelines  Tdap utd  Consider shingrix vaccines in future covid 193/3 Gala Murdoch  Eye MD seen Dr Rosalene Billings, [facility] MD 04/11/20  Glaucoma suspect  Pap 05/13/19 negative neg HPV  mammo 2/2/22neg Colonoscopy had in3/25/2013per pt she was ~61 per chart review was normal though I am unable to review report pt states she had at [facility] -need to get copy of reportdue 09/29/21  dexa 08/09/20 negative Never smoker but exposed to 2nd hand via husband 3/4/21renal US duplex normal  09/19/15 thyroid US with 10/04/15 benign bx IMPRESSION: 1. Thyromegaly with bilateral small nodules. Dominant  right lesion meets consensus criteria for biopsy. Ultrasound-guided fine needle aspiration should be considered, as per the consensus statement: Management of Thyroid Nodules Detected at [place]: Society of Radiologists in Ultrasound Consensus Conference Statement. Radiology 2005; [phone]   Provider: Dr. French Ana [name]-Internal Medicine

## 2020-09-04 ENCOUNTER — Telehealth: Payer: Self-pay | Admitting: Gastroenterology

## 2020-09-04 ENCOUNTER — Telehealth: Payer: Self-pay

## 2020-09-04 DIAGNOSIS — R195 Other fecal abnormalities: Secondary | ICD-10-CM

## 2020-09-04 NOTE — Telephone Encounter (Signed)
Pt is still having issues with blood pressure and medication. She would not disclose much info. Please advise

## 2020-09-04 NOTE — Telephone Encounter (Signed)
Pt does not want to do the colon until her BP is under control and she is sure the bacteria has cleared that was in her stomach.

## 2020-09-04 NOTE — Telephone Encounter (Signed)
Faxed note from Dr [employer] to Dr Bradd Burner office about Norvasc. She will consider adding it back if bp is still up at next visit in two weeks.

## 2020-09-04 NOTE — Telephone Encounter (Signed)
Okay thanks for letting me know, she can call to reschedule at her convenience.

## 2020-09-04 NOTE — Telephone Encounter (Signed)
Colon appt cancelled. Pt has not done the stool test yet to see if H pylori is still present.

## 2020-09-05 ENCOUNTER — Other Ambulatory Visit: Payer: Self-pay | Admitting: *Deleted

## 2020-09-05 DIAGNOSIS — R1011 Right upper quadrant pain: Secondary | ICD-10-CM | POA: Diagnosis not present

## 2020-09-05 NOTE — Telephone Encounter (Signed)
Hopefully nothing concerning but if her stools are "white" we may want to check her LFTs to make sure okay. If normal I would continue to monitor and we can reassess things with colonoscopy. I don't think we need to pursue additional testing otherwise right now. Thanks

## 2020-09-05 NOTE — Telephone Encounter (Signed)
Spoke with patient, advised that she can come by at her convenience to have the labs drawn. Patient is aware that no appointment is necessary. Advised patient that if labs are normal we will continue to monitor an we can re-assess with colonoscopy. Will await results. Patient verbalized understanding and had no concerns at the end of the call.   Lab order in epic

## 2020-09-05 NOTE — Telephone Encounter (Signed)
Spoke with patient, she states that she forgot to mention that her stools have been white for a couple of weeks, she reports solid stools but has diarrhea "here and there", she states that sometimes her stools are brown. Patient is concerned about this, she is wanting to know if she needs any additional stool tests to rule out other possible infections. Patient is going to have her daughter come pick up the stool kit for H. Pylori stool antigen test. Please advise, thanks.

## 2020-09-05 NOTE — Addendum Note (Signed)
Addended by: Missy Sabins on: 09/05/2020 12:26 PM   Modules accepted: Orders

## 2020-09-05 NOTE — Telephone Encounter (Signed)
Left message to return call 

## 2020-09-05 NOTE — Telephone Encounter (Signed)
Inbound call from patient requesting another call from a nurse please.  States has additional info she would like to discuss with them.

## 2020-09-06 ENCOUNTER — Other Ambulatory Visit: Payer: Self-pay

## 2020-09-06 ENCOUNTER — Telehealth: Payer: Self-pay | Admitting: Internal Medicine

## 2020-09-06 NOTE — Telephone Encounter (Signed)
See 09/06/20 telephone call encounter

## 2020-09-06 NOTE — Telephone Encounter (Signed)
Pt called she was seen at [facility] and needs to \\have  an [place] scheduled  Pt didn't go into detail just wanted a call back

## 2020-09-06 NOTE — Telephone Encounter (Signed)
Left message to return call 

## 2020-09-06 NOTE — Telephone Encounter (Signed)
Spoke with the Patient. State she was seen at urgent care for dizziness and abdominal pain. Patient had previously tested positive for H pylori and this was being followed by GI.   Patient states that she was supposed to do a stool sample for GI and urgent care informed her to contact PCP for Abdominal US. Informed the Patient that this should all be followed by her Gi provider.  Patient states she has a hard time with GI. States they never reach out to her and she has to be the one calling back all the time. States the sample they wanted her to leave would need to be taken and dropped off within the hour. Patient states due to their location she would not meet that time frame. Patient states that Gi told her that if she wanted this dropped off in [place] she would have to have PCP order this.   Informed the Patient that to have all of this ordered by Natasha Phillips she would need to be seen. Patient states she is fine with that she is in pain and wants to know what is going on.   Patient scheduled to come in 09/07/20 tomorrow at 8:00 am.

## 2020-09-07 ENCOUNTER — Ambulatory Visit
Admission: RE | Admit: 2020-09-07 | Discharge: 2020-09-07 | Disposition: A | Discharge status: Home / Self Care | Payer: Medicare Other | Source: Ambulatory Visit | Attending: Internal Medicine | Admitting: Internal Medicine

## 2020-09-07 ENCOUNTER — Other Ambulatory Visit: Payer: Self-pay

## 2020-09-07 ENCOUNTER — Ambulatory Visit (INDEPENDENT_AMBULATORY_CARE_PROVIDER_SITE_OTHER): Payer: Medicare Other | Admitting: Internal Medicine

## 2020-09-07 ENCOUNTER — Ambulatory Visit (INDEPENDENT_AMBULATORY_CARE_PROVIDER_SITE_OTHER): Payer: Medicare Other | Admitting: Pharmacist

## 2020-09-07 ENCOUNTER — Encounter: Payer: Self-pay | Admitting: Internal Medicine

## 2020-09-07 VITALS — BP 144/76 | HR 84 | Ht 61.0 in | Wt 164.0 lb

## 2020-09-07 DIAGNOSIS — I152 Hypertension secondary to endocrine disorders: Secondary | ICD-10-CM

## 2020-09-07 DIAGNOSIS — N1831 Chronic kidney disease, stage 3a: Secondary | ICD-10-CM | POA: Diagnosis not present

## 2020-09-07 DIAGNOSIS — E1159 Type 2 diabetes mellitus with other circulatory complications: Secondary | ICD-10-CM | POA: Diagnosis not present

## 2020-09-07 DIAGNOSIS — R1084 Generalized abdominal pain: Secondary | ICD-10-CM | POA: Diagnosis not present

## 2020-09-07 DIAGNOSIS — I10 Essential (primary) hypertension: Secondary | ICD-10-CM

## 2020-09-07 DIAGNOSIS — A048 Other specified bacterial intestinal infections: Secondary | ICD-10-CM

## 2020-09-07 DIAGNOSIS — E119 Type 2 diabetes mellitus without complications: Secondary | ICD-10-CM

## 2020-09-07 DIAGNOSIS — R195 Other fecal abnormalities: Secondary | ICD-10-CM

## 2020-09-07 DIAGNOSIS — Z1329 Encounter for screening for other suspected endocrine disorder: Secondary | ICD-10-CM

## 2020-09-07 DIAGNOSIS — R0789 Other chest pain: Secondary | ICD-10-CM

## 2020-09-07 LAB — LIPID PANEL
Cholesterol: 112 mg/dL (ref 0–200)
HDL: 48.3 mg/dL (ref >39.00)
LDL Cholesterol: 50 mg/dL (ref 0–99)
NonHDL: 63.66
Total CHOL/HDL Ratio: 2
Triglycerides: 66 mg/dL (ref 0.0–149.0)
VLDL: 13.2 mg/dL (ref 0.0–40.0)

## 2020-09-07 LAB — CBC WITH DIFFERENTIAL/PLATELET
Basophils Absolute: 0 10*3/uL (ref 0.0–0.1)
Basophils Relative: 0.6 % (ref 0.0–3.0)
Eosinophils Absolute: 0.1 10*3/uL (ref 0.0–0.7)
Eosinophils Relative: 1.3 % (ref 0.0–5.0)
HCT: 34.4 % — ABNORMAL LOW (ref 36.0–46.0)
Hemoglobin: 11.8 g/dL — ABNORMAL LOW (ref 12.0–15.0)
Lymphocytes Relative: 34.5 % (ref 12.0–46.0)
Lymphs Abs: 1.7 10*3/uL (ref 0.7–4.0)
MCHC: 34.4 g/dL (ref 30.0–36.0)
MCV: 87.1 fl (ref 78.0–100.0)
Monocytes Absolute: 0.3 10*3/uL (ref 0.1–1.0)
Monocytes Relative: 6.5 % (ref 3.0–12.0)
Neutro Abs: 2.9 10*3/uL (ref 1.4–7.7)
Neutrophils Relative %: 57.1 % (ref 43.0–77.0)
Platelets: 287 10*3/uL (ref 150.0–400.0)
RBC: 3.95 Mil/uL (ref 3.87–5.11)
RDW: 13.2 % (ref 11.5–15.5)
WBC: 5 10*3/uL (ref 4.0–10.5)

## 2020-09-07 LAB — MICROALBUMIN / CREATININE URINE RATIO
Creatinine,U: 34.4 mg/dL
Microalb Creat Ratio: 2.5 mg/g (ref 0.0–30.0)
Microalb, Ur: 0.9 mg/dL (ref 0.0–1.9)

## 2020-09-07 LAB — HEMOGLOBIN A1C: Hgb A1c MFr Bld: 6.4 % (ref 4.6–6.5)

## 2020-09-07 LAB — TSH: TSH: 0.73 u[IU]/mL (ref 0.35–4.50)

## 2020-09-07 LAB — TROPONIN I (HIGH SENSITIVITY): High Sens Troponin I: 4 ng/L (ref 2–17)

## 2020-09-07 MED ORDER — OLMESARTAN MEDOXOMIL 20 MG PO TABS: 10.0000 mg | ORAL_TABLET | Freq: Two times a day (BID) | ORAL | 3 refills | Status: DC

## 2020-09-07 MED ORDER — AMLODIPINE BESYLATE 2.5 MG PO TABS: 2.5000 mg | ORAL_TABLET | Freq: Every day | ORAL | 3 refills | Status: DC

## 2020-09-07 NOTE — Progress Notes (Signed)
Chief Complaint  Patient presents with  . Abdominal Pain   F/u  1. Ab pain RUQ, LUQ, lower abdomin middle, and mid abdomen since 09/05/20 went to [facility] in [place] and had CMET, UA yellow, clear, glucuse, bili, ketones, neg blood and protein and nitrites and leukocytes 4/10 H/o H pylori + 07/13/20 completed tx needs stool test for clearance per GI  -ROI sent [facility]  For 3 weeks had white changes on stool and has photos today reviewed   2. C/o chest tightness new 09/03/20 will have her f/u with Dr. Juliann Pares sooner than 10/2020 needs clearance for colonoscopy with [facility] Dr. Adela Lank and w/u chest tightness and control of HTN  Denies GERD/indigestion   3. htn improved has to take benicar 10 mg bid not 20 mg qd due to dizziness will add back norvasc 2.5 mg qd   Review of Systems  Constitutional: Negative for weight loss.  HENT: Negative for hearing loss.   Eyes: Negative for blurred vision.  Respiratory: Negative for shortness of breath.   Cardiovascular: Positive for chest pain.  Gastrointestinal: Positive for abdominal pain. Negative for nausea and vomiting.       White changes to stool  Musculoskeletal: Negative for falls.  Skin: Negative for rash.  Neurological: Negative for headaches.  Psychiatric/Behavioral: Negative for depression.   Past Medical History:  Diagnosis Date  . Anemia   . CKD (chronic kidney disease)   . Diabetes mellitus without complication (HCC)   . Glaucoma    [facility] with macular edema and Dr. Frederico Hamman sees Q3-4 months  . Hypertension    Past Surgical History:  Procedure Laterality Date  . BIOPSY THYROID    . CESAREAN SECTION     1981 1988  . TUBAL LIGATION     Family History  Problem Relation Age of Onset  . Diabetes Mother   . Heart failure Mother   . Diabetes Sister   . Healthy Sister   . Healthy Sister   . Healthy Sister   . Diabetes Brother   . Diabetes Sister   . Heart failure Father   . Brain cancer Brother   .  Throat cancer Brother   . Glaucoma Other        Runs on Paternal Side  . Breast cancer Cousin    Social History   Socioeconomic History  . Marital status: Widowed    Spouse name: Not on file  . Number of children: 2  . Years of education: College  . Highest education level: Not on file  Occupational History  . 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 Smoking status: Never Smoker  . Smokeless tobacco: Never Used  Vaping Use  . Vaping Use: Never used  Substance and Sexual Activity  . Alcohol use: No  . Drug use: No  . Sexual activity: Not Currently    Birth control/protection: None  Other Topics Concern  . Not on file  Social History Narrative   Lives at home    Social Determinants of Health   Financial Resource Strain: Low Risk   . Difficulty of Paying Living Expenses: Not hard at all  Food Insecurity: No Food Insecurity  . Worried About Programme researcher, broadcasting/film/video in the Last Year: Never true  . Ran Out of Food in the Last Year: Never true  Transportation Needs: Not on file  Physical Activity: Sufficiently Active  . Days of Exercise per Week: 7  days  . Minutes of Exercise per Session: 30 min  Stress: Not on file  Social Connections: Not on file  Intimate Partner Violence: Not on file   Current Meds  Medication Sig  . amLODipine (NORVASC) 2.5 MG tablet Take 1 tablet (2.5 mg total) by mouth at bedtime.  Marland Kitchen aspirin EC 81 MG tablet Take 81 mg by mouth daily.  . famotidine (PEPCID) 20 MG tablet Take 1 tablet (20 mg total) by mouth daily.  . fluticasone (FLONASE) 50 MCG/ACT nasal spray Place 2 sprays into both nostrils daily.  . furosemide (LASIX) 20 MG tablet Take 1 tablet (20 mg total) by mouth daily as needed for fluid or edema.  . hydrocortisone (ANUSOL-HC) 2.5 % rectal cream Place 1 application rectally 3 (three) times daily.  . Inulin (FIBER CHOICE FRUITY BITES) 1.5 g CHEW Take as directed  . loratadine (CLARITIN) 10 MG tablet Take  10 mg by mouth daily as needed for allergies.  . magnesium oxide (MAG-OX) 400 MG tablet Take 400 mg by mouth daily.  . metFORMIN (GLUCOPHAGE) 1000 MG tablet Take 1 tablet (1,000 mg total) by mouth daily.  . Multiple Vitamin (MULTIVITAMIN WITH MINERALS) TABS tablet Take 1 tablet by mouth daily.  . ONGLYZA 2.5 MG TABS tablet Take 1 tablet (2.5 mg total) by mouth daily. januvia caused diarrhea (Patient not taking: Reported on 09/07/2020)  . Rectal Protectant-Emollient (CALMOL-4) 76-10 % SUPP Take as directed  . triamcinolone cream (KENALOG) 0.1 % Apply 1 application topically 2 (two) times daily. Left lower leg bid prn  . ZINC-VITAMIN C PO Take 1 tablet by mouth daily.  . [DISCONTINUED] olmesartan (BENICAR) 20 MG tablet Take 1 tablet (20 mg total) by mouth daily.   Allergies  Allergen Reactions  . Coreg [Carvedilol]     Could not tolerate    . Hctz [Hydrochlorothiazide]     Low na 124   . Januvia [Sitagliptin] Diarrhea  . Sulfa Antibiotics   . Losartan Rash   Recent Results (from the past 2160 hour(s))  H. pylori antibody, IgG     Status: Abnormal   Collection Time: 07/13/20  9:43 AM  Result Value Ref Range   H Pylori IgG Positive (A) Negative   Objective  Body mass index is 30.99 kg/m. Wt Readings from Last 3 Encounters:  09/07/20 164 lb (74.4 kg)  09/01/20 168 lb 3.2 oz (76.3 kg)  07/13/20 174 lb (78.9 kg)   Temp Readings from Last 3 Encounters:  09/01/20 98.2 F (36.8 C) (Oral)  05/17/20 97.8 F (36.6 C) (Oral)  04/04/20 99.3 F (37.4 C) (Oral)   BP Readings from Last 3 Encounters:  09/07/20 (!) 144/76  09/01/20 (!) 160/94  07/13/20 (!) 148/88   Pulse Readings from Last 3 Encounters:  09/07/20 84  09/01/20 85  07/13/20 75    Physical Exam Vitals and nursing note reviewed.  Constitutional:      Appearance: Normal appearance. She is well-developed. She is obese.  HENT:     Head: Normocephalic and atraumatic.  Eyes:     Conjunctiva/sclera: Conjunctivae normal.      Pupils: Pupils are equal, round, and reactive to light.  Cardiovascular:     Rate and Rhythm: Normal rate and regular rhythm.     Heart sounds: Normal heart sounds.  Pulmonary:     Effort: Pulmonary effort is normal.     Breath sounds: Normal breath sounds.  Abdominal:     General: Abdomen is flat. Bowel sounds are normal.  Palpations: Abdomen is soft.     Tenderness: There is abdominal tenderness.    Skin:    General: Skin is warm and dry.  Neurological:     General: No focal deficit present.     Mental Status: She is alert and oriented to person, place, and time. Mental status is at baseline.     Gait: Gait normal.  Psychiatric:        Attention and Perception: Attention and perception normal.        Mood and Affect: Mood and affect normal.        Speech: Speech normal.        Behavior: Behavior normal. Behavior is cooperative.        Thought Content: Thought content normal.        Cognition and Memory: Cognition and memory normal.        Judgment: Judgment normal.     Assessment  Plan  Generalized abdominal pain fatty stools ?etiology - Plan: US Abdomen Complete, CBC w/Diff ROI [facility]  Hypertension, unspecified type - Plan: olmesartan (BENICAR) 10 MG tablet bid, amLODipine (NORVASC) 2.5 MG tablet Hypertension associated with diabetes (HCC) - Plan: Lipid panel, Hemoglobin A1c, CBC w/Diff, Microalbumin / creatinine urine ratio, amLODipine (NORVASC) 2.5 MG tablet CC Dr. Ronn Melena renal updates above   H. pylori infection F/u GI [facility] needs stool test for clerance   Chest pressure - Plan: Troponin I (High Sensitivity)  F/u with Dr. Juliann Pares htn, chest pressure needs cardiac clearance before colonoscopy [facility]    HM Flu shot declines prevnar utd  pna 23utd no need to updatedue to age per guidelines  Tdap utd  Consider shingrix vaccines in future covid 193/3 moderna  Eye MD seen Dr Rosalene Billings, [facility] MD 04/11/20   Glaucoma suspect  Pap 05/13/19 negative neg HPV  mammo 2/2/22neg Colonoscopy had in3/25/2013per pt she was ~61 per chart review was normal though I am unable to review report pt states she had at [facility] -need to get copy of reportdue 09/29/21  dexa 08/09/20 negative Never smoker but exposed to 2nd hand via husband 3/4/21renal US duplex normal  09/19/15 thyroid US with 10/04/15 benign bx IMPRESSION: 1. Thyromegaly with bilateral small nodules. Dominant right lesion meets consensus criteria for biopsy. Ultrasound-guided fine needle aspiration should be considered, as per the consensus statement: Management of Thyroid Nodules Detected at [place]: Society of Radiologists in Ultrasound Consensus Conference Statement. Radiology 2005; [phone]  [facility]   Provider: Dr. French Ana [name]-Internal Medicine

## 2020-09-07 NOTE — Patient Instructions (Signed)
Visit Information  PATIENT GOALS: Goals Addressed              This Visit's Progress     Patient Stated   .  Medication Monitoring (pt-stated)        Patient Goals/Self-Care Activities . Over the next 90 days, patient will:  - take medications as prescribed check glucose BID, document, and provide at future appointments target a minimum of 150 minutes of moderate intensity exercise weekly       The patient verbalized understanding of instructions, educational materials, and care plan provided today and declined offer to receive copy of patient instructions, educational materials, and care plan.   Plan: Telephone follow up appointment with care management team member scheduled for:  ~ 6 weeks  Catie Feliz Beam, PharmD, [place], CPP Clinical Pharmacist [employer] at [employer] [phone]

## 2020-09-07 NOTE — Progress Notes (Signed)
Chronic Care Management Pharmacy Note  09/07/2020 Name:  Natasha Phillips MRN:  [id number] DOB:  04-13-1951  Subjective: Natasha Phillips is an 70 y.o. year old female who is a primary patient of [name], Nino Glow, MD.  The CCM team was consulted for assistance with disease management and care coordination needs.    Engaged with patient face to face for follow up visit in response to provider referral for pharmacy case management and/or care coordination services.   Consent to Services:  The patient was given information about Chronic Care Management services, agreed to services, and gave verbal consent prior to initiation of services.  Please see initial visit note for detailed documentation.   Patient Care Team: [name], Nino Glow, MD as PCP - General (Internal Medicine) Malachy Mood, MD as Referring Physician (Obstetrics and Gynecology) De Hollingshead, RPH-CPP (Pharmacist)  Recent office visits:  PCP 2/25: BP elevated, increased olmesartan; reported burning sensations, f/u with neurology.   Recent consult visits: None since our last appointment   Hospital visits: None in previous 6 months  Objective:  Lab Results  Component Value Date   CREATININE 1.09 08/06/2019   BUN 29 (H) 08/06/2019   GFR 60.60 08/06/2019   GFRNONAA 55 (L) 07/12/2019   GFRAA 63 07/12/2019   NA 137 08/06/2019   K 4.2 08/06/2019   CALCIUM 10.5 08/06/2019   CO2 30 08/06/2019    Lab Results  Component Value Date/Time   HGBA1C 6.5 (A) 03/28/2020 11:51 AM   HGBA1C 5.6 07/12/2019 09:54 AM   HGBA1C 5.5 06/07/2019 08:46 AM   HGBA1C 6.5 (A) 11/14/2014 12:00 AM   GFR 60.60 08/06/2019 01:43 PM   MICROALBUR 20 03/12/2018 11:01 AM   MICROALBUR 20 11/07/2016 11:23 AM    Last diabetic Eye exam:  Lab Results  Component Value Date/Time   HMDIABEYEEXA Retinopathy (A) 07/31/2018 12:00 AM    Last diabetic Foot exam: No results found for: HMDIABFOOTEX   Lab Results  Component Value  Date   CHOL 128 08/11/2019   HDL 62 08/11/2019   LDLCALC 55 08/11/2019   TRIG 44 08/11/2019   CHOLHDL 2.1 08/11/2019    Hepatic Function Latest Ref Rng & Units 08/24/2019 07/12/2019 05/03/2019  Total Protein 6.0 - 8.5 g/dL 8.3 7.5 -  Albumin 3.8 - 4.8 g/dL - 4.2 4.4  AST 0 - 40 IU/L - 18 -  ALT 0 - 32 IU/L - 14 -  Alk Phosphatase 39 - 117 IU/L - 108 -  Total Bilirubin 0.0 - 1.2 mg/dL - 0.2 -    Lab Results  Component Value Date/Time   TSH 1.850 07/12/2019 09:54 AM   TSH 1.060 02/02/2019 09:50 AM    CBC Latest Ref Rng & Units 07/12/2019 05/03/2019 04/18/2019  WBC 3.4 - 10.8 x10E3/uL 5.2 4.2 5.0  Hemoglobin 11.1 - 15.9 g/dL 11.2 11.0(L) 9.8(L)  Hematocrit 34.0 - 46.6 % 34.0 34.2 28.7(L)  Platelets 150 - 450 x10E3/uL 255 267 238    Lab Results  Component Value Date/Time   VD25OH 41.2 02/02/2019 09:50 AM   VD25OH 39.0 10/07/2017 10:39 AM    Clinical ASCVD: No    Depression screen [facility] 2/9 06/23/2020 06/12/2020 03/28/2020  Decreased Interest 0 0 0  Down, Depressed, Hopeless 0 0 0  PHQ - 2 Score 0 0 0  Altered sleeping 0 0 -  Tired, decreased energy 0 0 -  Change in appetite 0 0 -  Feeling bad or failure about yourself  0 0 -  Trouble concentrating 0 0 -  Moving slowly or fidgety/restless 0 0 -  Suicidal thoughts 0 0 -  PHQ-9 Score 0 0 -  Difficult doing work/chores - Not difficult at all -     Social History   Tobacco Use  Smoking Status Never Smoker  Smokeless Tobacco Never Used   BP Readings from Last 3 Encounters:  09/07/20 (!) 144/76  09/01/20 (!) 160/94  07/13/20 (!) 148/88   Pulse Readings from Last 3 Encounters:  09/07/20 84  09/01/20 85  07/13/20 75   Wt Readings from Last 3 Encounters:  09/07/20 164 lb (74.4 kg)  09/01/20 168 lb 3.2 oz (76.3 kg)  07/13/20 174 lb (78.9 kg)    Assessment/Interventions: Review of patient past medical history, allergies, medications, health status, including review of consultants reports, laboratory and other test  data, was performed as part of comprehensive evaluation and provision of chronic care management services.   SDOH:  (Social Determinants of Health) assessments and interventions performed: Yes SDOH Interventions   Flowsheet Row Most Recent Value  SDOH Interventions   Financial Strain Interventions Intervention Not Indicated      CCM Care Plan  Allergies  Allergen Reactions  . Coreg [Carvedilol]     Could not tolerate    . Hctz [Hydrochlorothiazide]     Low na 124   . Januvia [Sitagliptin] Diarrhea  . Sulfa Antibiotics   . Losartan Rash    Medications Reviewed Today    Reviewed by De Hollingshead, RPH-CPP (Pharmacist) on 09/07/20 at [place] List Status: <None>  Medication Order Taking? Sig Documenting Provider Last Dose Status Informant  amLODipine (NORVASC) 2.5 MG tablet [id number]  Take 1 tablet (2.5 mg total) by mouth at bedtime. [name], Nino Glow, MD  Active   aspirin EC 81 MG tablet [id number]  Take 81 mg by mouth daily. [provider]  Active   famotidine (PEPCID) 20 MG tablet [id number]  Take 1 tablet (20 mg total) by mouth daily. Yetta Flock, MD  Active   fluticasone (FLONASE) 50 MCG/ACT nasal spray [id number]  Place 2 sprays into both nostrils daily. [name], Nino Glow, MD  Active   furosemide (LASIX) 20 MG tablet [id number]  Take 1 tablet (20 mg total) by mouth daily as needed for fluid or edema. Fenton Malling M, PA-C  Active   hydrocortisone (ANUSOL-HC) 2.5 % rectal cream [id number]  Place 1 application rectally 3 (three) times daily. [name], Nino Glow, MD  Active   Inulin (FIBER CHOICE FRUITY BITES) 1.5 g CHEW [id number]  Take as directed Armbruster, Carlota Raspberry, MD  Active   loratadine (CLARITIN) 10 MG tablet [id number]  Take 10 mg by mouth daily as needed for allergies. [provider]  Active   magnesium oxide (MAG-OX) 400 MG tablet [id number]  Take 400 mg by mouth daily. [provider]  Active   metFORMIN  (GLUCOPHAGE) 1000 MG tablet [id number]  Take 1 tablet (1,000 mg total) by mouth daily. [name], Nino Glow, MD  Active   Multiple Vitamin (MULTIVITAMIN WITH MINERALS) TABS tablet [id number]  Take 1 tablet by mouth daily. [provider]  Active Self  olmesartan (BENICAR) 20 MG tablet [id number]  Take 0.5 tablets (10 mg total) by mouth in the morning and at bedtime. [name], Nino Glow, MD  Active   ONGLYZA 2.5 MG TABS tablet [id number] No Take 1 tablet (2.5 mg total) by mouth daily. januvia caused diarrhea  Patient not taking: Reported on 09/07/2020   [name], Nino Glow,  MD Not Taking Active   Rectal Protectant-Emollient (CALMOL-4) 76-10 % SUPP [id number]  Take as directed Armbruster, Carlota Raspberry, MD  Active   triamcinolone cream (KENALOG) 0.1 % [id number]  Apply 1 application topically 2 (two) times daily. Left lower leg bid prn [name], Nino Glow, MD  Active   ZINC-VITAMIN C PO [id number]  Take 1 tablet by mouth daily. [provider]  Active           Patient Active Problem List   Diagnosis Date Noted  . H. pylori infection 09/07/2020  . Arthritis 09/01/2020  . Hypertension associated with diabetes ([place]) 05/19/2020  . Scoliosis of thoracolumbar spine 05/19/2020  . Facet hypertrophy of lumbar region 05/19/2020  . Cervical arthritis 11/26/2019  . Benign hypertensive kidney disease with chronic kidney disease 11/15/2019  . Stage 3a chronic kidney disease ([place]) 11/15/2019  . Hypertensive chronic kidney disease with stage 1 through stage 4 chronic kidney disease, or unspecified chronic kidney disease 11/15/2019  . Pain in limb 11/12/2019  . Neck pain 08/24/2019  . Disorder of both eustachian tubes 08/24/2019  . Leg cramps 08/09/2019  . CKD (chronic kidney disease) stage 2, GFR 60-89 ml/min 08/03/2019  . Leg edema 07/22/2019  . Hyponatremia 04/17/2019  . Gastroesophageal reflux disease 09/16/2018  . Erosion of oral mucosa 10/28/2016  . Thyroid nodule  10/14/2015  . Abnormal cervical Papanicolaou smear 01/11/2015  . Absolute anemia 01/11/2015  . Mild mitral insufficiency 01/11/2015  . CN (constipation) 01/11/2015  . Calcium blood increased 01/11/2015  . LBP (low back pain) 01/11/2015  . Obesity (BMI 30-39.9) 01/11/2015  . Leg paresthesia 01/11/2015  . Neuralgia neuritis, sciatic nerve 01/11/2015  . Avitaminosis D 01/11/2015  . Essential hypertension 11/22/2014  . Controlled type 2 diabetes mellitus without complication ([place]) 41/74/0814    Immunization History  Administered Date(s) Administered  . Hepatitis B 09/20/2011, 11/01/2011, 04/03/2012  . Hepatitis B, adult 09/20/2011, 11/01/2011, 04/03/2012  . Moderna Sars-Covid-2 Vaccination 10/19/2019, 11/16/2019, 05/25/2020  . Pneumococcal Conjugate-13 03/12/2018  . Pneumococcal Polysaccharide-23 05/19/2013, 05/19/2013  . Tdap 02/07/2012    Conditions to be addressed/monitored:  Hypertension, Hyperlipidemia, Diabetes and Chronic Kidney Disease  Care Plan : Medication Management  Updates made by De Hollingshead, RPH-CPP since 09/07/2020 12:00 AM    Problem: Diabetes, HTN, CKD     Long-Range Goal: Disease Progression Prevention   Start Date: 07/27/2020  Recent Progress: On track  Priority: High  Note:   Current Barriers:  . Unable to independently afford treatment regimen  Pharmacist Clinical Goal(s):  Marland Kitchen Over the next 90 days, patient will verbalize ability to afford treatment regimen through collaboration with PharmD and provider.   Interventions: . 1:1 collaboration with [name], Nino Glow, MD regarding development and update of comprehensive plan of care as evidenced by provider attestation and co-signature . Inter-disciplinary care team collaboration (see longitudinal plan of care) . Comprehensive medication review performed; medication list updated in electronic medical record  Health Maintenance: . Up to date on DEXA (t score 0); mammogram . Up to date on  COVID vaccinations   Diabetes: . Controlled; current treatment: metformin 1000 mg daily o Significant diarrhea with Januvia o Previously on Onglyza, but insurance required trial and failure of Tradjenta to cover Onglyza. Decided to hold Onglyza to follow glucose  . Current glucose readings: has not been checking lately, concerned w/ s/p H pylori infection and variable BP at this moment . Current meal patterns: focused on Mediterranean Diet . Current exercise: walks 30 minutes daily. Has  a treadmill.  . Follow glucose readings. F/u with PCP with A1c anticipated later this month. If elevated, will plan to trial Tradjenta w/ samples. If tolerated and effective, will be covered under insurance. If not tolerated, will be able to re-submit PA to insurance that patient had tried and failed Januvia and Tradjenta.  . Alternatively, consider SGLT2 given CKD  Hypertension, CKD: . Uncontrolled; current treatment; olmesartan 10 mg BID, adding back amlodipine 2.5 mg daily today; furosemide 20 mg PRN edema; follows w/ cardiology Dr. Clayborn Bigness and nephrology Dr. Juleen China . Recommend to continue current regimen at this time along with collaboration with cardiology and nephrology. PCP advised sooner cardiology f/u.   ASCVD risk reduction . Controlled; current lipid treatment: none . 10 year ASCVD risk score: unable to be calculated d/t well controlled lipids; LDL 55, TG 44, TC 128 . Current dietary patterns: following a mediterranean diet - lean meats, high in fruits and vegetables.  . Antiplatelet regimen: aspirin 81 mg daily  . Given LDL at goal 55 without therapy, appropriate to defer statin therapy at this time.   H Pylori/GERD: . Completed treatment, but with continued GI upset. PCP ordered abdominal US today.  Follows w/ GI Dr. Havery Moros.  . Continue current regimen along with collaboration with GI.  Allergies: . Controlled at this time of the year without therapy; current regimen: loratidine 10 mg  daily PRN, fluticasone intranasal PRN . Continue to monitor for need to re-introduce allergy medications  Supplements: Marland Kitchen Vitamin C + Zinc, Multivitamin, Magnesium . No interactions noted. Continue current regimen  Patient Goals/Self-Care Activities . Over the next 90 days, patient will:  - take medications as prescribed check glucose BID, document, and provide at future appointments target a minimum of 150 minutes of moderate intensity exercise weekly  Follow Up Plan: Telephone follow up appointment with care management team member scheduled for: ~6 weeks        Medication Assistance: None required.  Patient affirms current coverage meets needs.  Patient's preferred pharmacy is:  [id number] [address], [address] [place] [address] Phone: [phone] Fax: [phone]   Care Plan and Follow Up Patient Decision:  Patient agrees to Care Plan and Follow-up.  Plan: Telephone follow up appointment with care management team member scheduled for:  ~ 6 weeks  [name], PharmD, [place], [place] Clinical Pharmacist [employer] at [employer] [phone]

## 2020-09-07 NOTE — Patient Instructions (Signed)
Take norvasc/amlodipine 2.5 mg at night  Monitor blood pressure   Helicobacter Pylori Infection Helicobacter pylori infection is a bacterial infection in the stomach. Long-term (chronic) infection can cause stomach irritation (gastritis), ulcers in the stomach (gastric ulcers), and ulcers in the upper part of the intestine (duodenal ulcers). Having this infection may also increase your risk of stomach cancer and a type of white blood cell cancer (lymphoma) that affects the stomach. What are the causes? This infection is caused by the Helicobacter pylori (H. pylori) bacteria. Many healthy people have this bacteria in their stomach lining. The bacteria may also spread from person to person through contact with stool (feces) or saliva. It is not known why some people develop ulcers, gastritis, or cancer from the bacteria. What increases the risk? You are more likely to develop this condition if you:  Have family members with the infection.  Live with many other people, such as in a dormitory.  Are of African, Hispanic, or Asian descent. What are the signs or symptoms? Most people with this infection do not have any symptoms. If you do have symptoms, they may include:  Heartburn.  Stomach pain.  Nausea.  Vomiting. The vomit may be bloody because of ulcers.  Loss of appetite.  Bad breath. How is this diagnosed? This condition may be diagnosed based on:  Your symptoms and medical history.  A physical exam.  Blood tests.  Stool tests.  A breath test.  A procedure that involves placing a tube with a camera on the end of it down your throat to examine your stomach and upper intestine (upper endoscopy).  Removing and testing a tissue sample from the stomach lining (biopsy). A biopsy may be taken during an upper endoscopy. How is this treated? This condition is treated by taking a combination of medicines (triple therapy) for several weeks. Triple therapy includes one medicine to  reduce the amount of acid in your stomach and two types of antibiotic medicines. This treatment may reduce your risk of cancer. You may need to be tested for H. pylori again after treatment. In some cases, the treatment may need to be repeated if your treatment did not get rid of all the bacteria.   Follow these instructions at home:  Take over-the-counter and prescription medicines only as told by your health care provider.  Take your antibiotics as told by your health care provider. Do not stop taking the antibiotics even if you start to feel better.  Return to your normal activities as told by your health care provider. Ask your health care provider what activities are safe for you.  Take steps to prevent future infections: ? Wash your hands often with soap and water. If soap and water are not available, use hand sanitizer. ? Do not eat food or drink water that may have had contact with stool or saliva.  Keep all follow-up visits as told by your health care provider. This is important. You may need tests to make sure your treatment worked.   Contact a health care provider if your symptoms:  Do not get better with treatment.  Return after treatment. Summary  Helicobacter pylori infection is a stomach infection caused by the Helicobacter pylori (H. pylori) bacteria.  This infection can cause stomach irritation (gastritis), ulcers in the stomach (gastric ulcers), and ulcers in the upper part of the intestine (duodenal ulcers).  This condition is treated by taking a combination of medicines (triple therapy) for several weeks.  Take your antibiotics as  told by your health care provider. Do not stop taking the antibiotics even if you start to feel better. This information is not intended to replace advice given to you by your health care provider. Make sure you discuss any questions you have with your health care provider. Document Revised: 10/15/2018 Document Reviewed:  06/17/2017 Elsevier Patient Education  2021 Elsevier Inc.  Abdominal Pain, Adult Pain in the abdomen (abdominal pain) can be caused by many things. Often, abdominal pain is not serious and it gets better with no treatment or by being treated at home. However, sometimes abdominal pain is serious. Your health care provider will ask questions about your medical history and do a physical exam to try to determine the cause of your abdominal pain. Follow these instructions at home: Medicines  Take over-the-counter and prescription medicines only as told by your health care provider.  Do not take a laxative unless told by your health care provider. General instructions  Watch your condition for any changes.  Drink enough fluid to keep your urine pale yellow.  Keep all follow-up visits as told by your health care provider. This is important.   Contact a health care provider if:  Your abdominal pain changes or gets worse.  You are not hungry or you lose weight without trying.  You are constipated or have diarrhea for more than 2-3 days.  You have pain when you urinate or have a bowel movement.  Your abdominal pain wakes you up at night.  Your pain gets worse with meals, after eating, or with certain foods.  You are vomiting and cannot keep anything down.  You have a fever.  You have blood in your urine. Get help right away if:  Your pain does not go away as soon as your health care provider told you to expect.  You cannot stop vomiting.  Your pain is only in areas of the abdomen, such as the right side or the left lower portion of the abdomen. Pain on the right side could be caused by appendicitis.  You have bloody or black stools, or stools that look like tar.  You have severe pain, cramping, or bloating in your abdomen.  You have signs of dehydration, such as: ? Dark urine, very little urine, or no urine. ? Cracked lips. ? Dry mouth. ? Sunken  eyes. ? Sleepiness. ? Weakness.  You have trouble breathing or chest pain. Summary  Often, abdominal pain is not serious and it gets better with no treatment or by being treated at home. However, sometimes abdominal pain is serious.  Watch your condition for any changes.  Take over-the-counter and prescription medicines only as told by your health care provider.  Contact a health care provider if your abdominal pain changes or gets worse.  Get help right away if you have severe pain, cramping, or bloating in your abdomen. This information is not intended to replace advice given to you by your health care provider. Make sure you discuss any questions you have with your health care provider. Document Revised: 08/13/2019 Document Reviewed: 11/02/2018 Elsevier Patient Education  2021 [employer].

## 2020-09-07 NOTE — Telephone Encounter (Signed)
Per secure chat with Dr. Havery Moros and patient's PCP - New orders in epic for patient to have LFTs drawn at [facility] and to be able to drop off stool kit there as well. Patient to call and reschedule procedures at her convenience  Lm on vm for patient to return call for updated information.

## 2020-09-07 NOTE — Telephone Encounter (Signed)
Per secure chat from Dr. Adela Lank [facility] after further review of notes sent to me, it sounds like she has been referred to cardiology for some other symptoms, she will need that evaluation completed prior to booking EGD / colonoscopy. thanks

## 2020-09-08 ENCOUNTER — Encounter: Payer: Medicare Other | Admitting: Gastroenterology

## 2020-09-08 ENCOUNTER — Telehealth: Payer: Self-pay | Admitting: Internal Medicine

## 2020-09-08 ENCOUNTER — Other Ambulatory Visit: Payer: Medicare Other

## 2020-09-08 DIAGNOSIS — A048 Other specified bacterial intestinal infections: Secondary | ICD-10-CM | POA: Diagnosis not present

## 2020-09-08 NOTE — Telephone Encounter (Signed)
Patient would like to speak to Natasha Phillips about results that need to go to [place]. Please call her [phone].

## 2020-09-08 NOTE — Telephone Encounter (Signed)
Spoke with patient, she was able to leave stool sample for H. Pylori today. Patient states that she went to urgent care the other day and they did liver labs. Advised that they will need to fax [place] the results for Dr. Adela Lank to review, attempted to provide patient with the fax number but she states that she was driving, advised that I will send the information to her My Chart, she is aware that if she cant access the message she will need to call us back and let us know. Patient is aware that she will need to have cardiology evaluation prior to scheduling procedures. Patient verbalized understanding and had no concerns at the end of the call.

## 2020-09-08 NOTE — Telephone Encounter (Signed)
Left message to return call 

## 2020-09-10 LAB — H. PYLORI ANTIGEN, STOOL: H pylori Ag, Stl: NEGATIVE

## 2020-09-12 ENCOUNTER — Telehealth: Payer: Self-pay | Admitting: Gastroenterology

## 2020-09-12 NOTE — Telephone Encounter (Signed)
Labs arrived as outlined:  AP 121, T bil 0.6, AST 23, ALT 16 BUN 14, Cr 1.2  Overall labs stable, not sure what to make of her change in stool color, perhaps due to ingestion or medication. LFTs normal and reassuring. Will await EGD and colonoscopy once she receives cardiac clearance, we can schedule.  [place] can you send her a Well[facility] geologist with recommendations above. Thanks

## 2020-09-12 NOTE — Telephone Encounter (Signed)
Patient has been notified via My Chart.

## 2020-09-18 ENCOUNTER — Telehealth: Payer: Self-pay

## 2020-09-18 NOTE — Telephone Encounter (Signed)
Faxed signed note from Dr [employer] . Asked Dr Juliann Pares to schedule sooner than 10/2020.  Sent to fax [phone] on 09/18/20

## 2020-09-26 DIAGNOSIS — H2513 Age-related nuclear cataract, bilateral: Secondary | ICD-10-CM | POA: Diagnosis not present

## 2020-09-26 DIAGNOSIS — H40053 Ocular hypertension, bilateral: Secondary | ICD-10-CM | POA: Diagnosis not present

## 2020-09-26 DIAGNOSIS — H524 Presbyopia: Secondary | ICD-10-CM | POA: Diagnosis not present

## 2020-09-26 DIAGNOSIS — E113413 Type 2 diabetes mellitus with severe nonproliferative diabetic retinopathy with macular edema, bilateral: Secondary | ICD-10-CM | POA: Diagnosis not present

## 2020-09-26 DIAGNOSIS — H5203 Hypermetropia, bilateral: Secondary | ICD-10-CM | POA: Diagnosis not present

## 2020-09-26 DIAGNOSIS — H52223 Regular astigmatism, bilateral: Secondary | ICD-10-CM | POA: Diagnosis not present

## 2020-09-27 DIAGNOSIS — R42 Dizziness and giddiness: Secondary | ICD-10-CM | POA: Diagnosis not present

## 2020-09-27 DIAGNOSIS — E1122 Type 2 diabetes mellitus with diabetic chronic kidney disease: Secondary | ICD-10-CM | POA: Diagnosis not present

## 2020-09-27 DIAGNOSIS — R252 Cramp and spasm: Secondary | ICD-10-CM | POA: Diagnosis not present

## 2020-09-27 DIAGNOSIS — H8113 Benign paroxysmal vertigo, bilateral: Secondary | ICD-10-CM | POA: Diagnosis not present

## 2020-09-27 DIAGNOSIS — G939 Disorder of brain, unspecified: Secondary | ICD-10-CM | POA: Diagnosis not present

## 2020-09-27 DIAGNOSIS — I129 Hypertensive chronic kidney disease with stage 1 through stage 4 chronic kidney disease, or unspecified chronic kidney disease: Secondary | ICD-10-CM | POA: Diagnosis not present

## 2020-09-27 DIAGNOSIS — N183 Chronic kidney disease, stage 3 unspecified: Secondary | ICD-10-CM | POA: Diagnosis not present

## 2020-09-27 DIAGNOSIS — R2689 Other abnormalities of gait and mobility: Secondary | ICD-10-CM | POA: Diagnosis not present

## 2020-09-27 DIAGNOSIS — N1831 Chronic kidney disease, stage 3a: Secondary | ICD-10-CM | POA: Diagnosis not present

## 2020-09-28 ENCOUNTER — Ambulatory Visit: Payer: Medicare Other | Admitting: Internal Medicine

## 2020-10-04 ENCOUNTER — Ambulatory Visit (INDEPENDENT_AMBULATORY_CARE_PROVIDER_SITE_OTHER): Payer: Medicare Other | Admitting: Internal Medicine

## 2020-10-04 ENCOUNTER — Other Ambulatory Visit: Payer: Self-pay

## 2020-10-04 ENCOUNTER — Encounter: Payer: Self-pay | Admitting: Internal Medicine

## 2020-10-04 ENCOUNTER — Other Ambulatory Visit: Payer: Self-pay | Admitting: Internal Medicine

## 2020-10-04 VITALS — BP 140/80 | HR 76 | Temp 97.5°F | Ht 61.0 in | Wt 168.2 lb

## 2020-10-04 DIAGNOSIS — I10 Essential (primary) hypertension: Secondary | ICD-10-CM

## 2020-10-04 DIAGNOSIS — J309 Allergic rhinitis, unspecified: Secondary | ICD-10-CM

## 2020-10-04 DIAGNOSIS — E119 Type 2 diabetes mellitus without complications: Secondary | ICD-10-CM | POA: Diagnosis not present

## 2020-10-04 MED ORDER — NIFEDIPINE ER OSMOTIC RELEASE 60 MG PO TB24
60.0000 mg | ORAL_TABLET | Freq: Every day | ORAL | 0 refills | Status: DC
Start: 2020-10-04 — End: 2020-10-04

## 2020-10-04 MED ORDER — OLMESARTAN MEDOXOMIL 5 MG PO TABS: 10.0000 mg | ORAL_TABLET | Freq: Two times a day (BID) | ORAL | 3 refills | Status: DC

## 2020-10-04 MED ORDER — FLUTICASONE PROPIONATE 50 MCG/ACT NA SUSP: 2.0000 | Freq: Every day | NASAL | 11 refills | Status: DC

## 2020-10-04 MED ORDER — METFORMIN HCL 1000 MG PO TABS
1000.0000 mg | ORAL_TABLET | Freq: Every day | ORAL | 4 refills | Status: DC
Start: 2020-10-04 — End: 2021-10-31

## 2020-10-04 NOTE — Patient Instructions (Addendum)
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 NIFEDIPINE (nye FED i peen) is a calcium channel blocker. It relaxes your blood vessels and decreases the amount of work the heart has to do. It treats high blood pressure and/or prevents chest pain (also called angina). This medicine may be used for other purposes; ask your health care provider or pharmacist if you have questions. COMMON BRAND NAME(S): Adalat CC, Afeditab CR, Nifediac CC, Nifedical XL, Procardia XL What should I tell my health care provider before I take this medicine? They need to know if you have any of these conditions:  blockage in your bowels  constipation  heart attack  heart disease  heart failure  liver disease  low blood pressure  an unusual or allergic reaction to nifedipine, other drugs, foods, dyes or preservatives  pregnant or trying to get pregnant  breast-feeding How should I use this medicine? Take this drug by mouth. Take it as directed on the prescription label at the same time every day. Do not cut, crush or chew this drug. Swallow the tablets whole. Some tablets need to be taken on an empty stomach. Ask your pharmacist or health care provider if you have any questions. Keep taking it unless your health care  provider tells you to stop. Do not take this drug with grapefruit juice. Talk to your health care provider about the use of this drug in children. Special care may be needed. Overdosage: If you think you have taken too much of this medicine contact a poison control center or emergency room at once. NOTE: This medicine is only for you. Do not share this medicine with others. What if I miss a dose? If you miss a dose, take it as soon as you can. If it is almost time for your next dose, take only that dose. Do not take double or extra doses. What may interact with this medicine? Do not take this medicine with any of the following medications:  certain medicines for seizures like carbamazepine, phenobarbital, phenytoin  lumacaftor; ivacaftor  rifabutin  rifampin  rifapentine  St. John's Wort This medicine may also interact with the following medications:  antiviral medicines for HIV or AIDS  certain medicines for blood pressure  certain medicines for diabetes  certain medicines for erectile dysfunction  certain medicines for fungal infections like ketoconazole, fluconazole, and itraconazole  certain medicines for irregular heart beat like flecainide and quinidine  certain medicines that treat or prevent blood clots like warfarin  clarithromycin  digoxin  dolasetron  erythromycin  fluoxetine  grapefruit juice  local or general anesthetics  nefazodone  orlistat  quinupristin; dalfopristin  sirolimus  stomach acid blockers like cimetidine, ranitidine, omeprazole, or pantoprazole  tacrolimus  valproic acid This list may not describe all possible interactions. Give your health care provider a list of all the medicines, herbs, non-prescription  drugs, or dietary supplements you use. Also tell them if you smoke, drink alcohol, or use illegal drugs. Some items may interact with your medicine. What should I watch for while using this medicine? Visit your health  care provider for regular checks on your progress. Check your blood pressure as directed. Ask your health care provider what your blood pressure should be. Also, find out when you should contact him or her. Do not treat yourself for coughs, colds, or pain while you are using this drug without asking your health care provider for advice. Some drugs may increase your blood pressure. You may get drowsy or dizzy. Do not drive, use machinery, or do anything that needs mental alertness until you know how this drug affects you. Do not stand up or sit up quickly, especially if you are an older patient. This reduces the risk of dizzy or fainting spells. The tablet shell for some brands of this drug does not dissolve. This is normal. The tablet shell may appear whole in the stool. This is not a cause for concern. What side effects may I notice from receiving this medicine? Side effects that you should report to your doctor or health care provider as soon as possible:  allergic reactions (skin rash, itching or hives; swelling of the face, lips, or tongue)  heart attack (trouble breathing; pain or tightness in the chest, neck, back or arms; unusually weak or tired)  heart failure (trouble breathing; fast, irregular heartbeat; sudden weight gain; swelling of the ankles, feet, hands; unusually weak or tired)  low blood pressure (dizziness; feeling faint or lightheaded, falls; unusually weak or tired) Side effects that usually do not require medical attention (report to your doctor or health care provider if they continue or are bothersome):  bloating  changes in emotions or moods  constipation  facial flushing  headache  nasal congestion (like runny or stuffy nose)  nausea  stomach pain This list may not describe all possible side effects. Call your doctor for medical advice about side effects. You may report side effects to FDA at 1-800-FDA-1088. Where should I keep my medicine? Keep out of the  reach of children and pets. Store at room temperature between 20 and 25 degrees C (68 and 77 degrees F). Protect from light and moisture. Keep the container tightly closed. Throw away any unused drug after the expiration date. NOTE: This sheet is a summary. It may not cover all possible information. If you have questions about this medicine, talk to your doctor, pharmacist, or health care provider.  2021 Elsevier/Gold Standard (2019-04-20 08:24:11)

## 2020-10-04 NOTE — Assessment & Plan Note (Signed)
uncontrolled bystolic 2.5 causes chest tightness and hr down to 58 BP at home 110/75 at times. Also on benicar on 10 mg bid taking 2 5mg  bid unable to tolerate norvasc 2.5, losartan, coreg, propranolol in the paste and hctz could have caused low sodium  Reviewed with renal Dr. today and rec d/c bystolic and rec nifedipine xr 60 mg qd vs cardura vs guanfacine will try nifedipine reviewed with pt

## 2020-10-04 NOTE — Progress Notes (Signed)
Chief Complaint  Patient presents with  . Follow-up  . Medication Management    Patient was taken off of the amlodipine and placed on Bystolic. She states the Bystolic make her feel like "A train is on her chest," while the Amlodipine, "made her feel like a fish."    F/u  1. htn uncontrolled bystolic 2.5 causes chest tightness and hr down to 58 BP at home 110/75 at times. Also on benicar on 10 mg bid taking 2 5mg  bid unable to tolerate norvasc 2.5, losartan, coreg, propranolol in the paste and hctz could have caused low sodium  Reviewed with renal Dr. today and rec d/c bystolic and rec nifedipine xr 60 mg qd vs cardura vs guanfacine will try nifedipine reviewed with pt    Review of Systems  Constitutional: Negative for weight loss.  HENT: Negative for hearing loss.   Eyes: Negative for blurred vision.  Respiratory: Negative for shortness of breath.   Cardiovascular: Negative for chest pain.  Gastrointestinal: Negative for abdominal pain.  Musculoskeletal: Negative for falls and joint pain.  Skin: Negative for rash.  Neurological: Negative for headaches.  Psychiatric/Behavioral: Negative for depression.   Past Medical History:  Diagnosis Date  . Anemia   . CKD (chronic kidney disease)   . Diabetes mellitus without complication (HCC)   . Glaucoma    [facility] with macular edema and Dr. Ronn Melena sees Q3-4 months  . Hypertension    Past Surgical History:  Procedure Laterality Date  . BIOPSY THYROID    . CESAREAN SECTION     1981 1988  . TUBAL LIGATION     Family History  Problem Relation Age of Onset  . Diabetes Mother   . Heart failure Mother   . Diabetes Sister   . Healthy Sister   . Healthy Sister   . Healthy Sister   . Diabetes Brother   . Diabetes Sister   . Heart failure Father   . Brain cancer Brother   . Throat cancer Brother   . Glaucoma Other        Runs on Paternal Side  . Breast cancer Cousin    Social History   Socioeconomic History  .  Marital status: Widowed    Spouse name: Not on file  . Number of children: 2  . Years of education: College  . Highest education level: Not on file  Occupational History  . Occupation: Retired 2014    Comment: Previously 2015 [employer]  Tobacco Use  . Smoking status: Never Smoker  . Smokeless tobacco: Never Used  Vaping Use  . Vaping Use: Never used  Substance and Sexual Activity  . Alcohol use: No  . Drug use: No  . Sexual activity: Not Currently    Birth control/protection: None  Other Topics Concern  . Not on file  Social History Narrative   Lives at home    Social Determinants of Health   Financial Resource Strain: Low Risk   . Difficulty of Paying Living Expenses: Not very hard  Food Insecurity: No Food Insecurity  . Worried About Sports coach in the Last Year: Never true  . Ran Out of Food in the Last Year: Never true  Transportation Needs: Not on file  Physical Activity: Sufficiently Active  . Days of Exercise per Week: 7 days  . Minutes of Exercise per Session: 30 min  Stress: Not on file  Social Connections: Not on file  Intimate Partner Violence: Not on file  Current Meds  Medication Sig  . aspirin EC 81 MG tablet Take 81 mg by mouth daily.  . famotidine (PEPCID) 20 MG tablet Take 1 tablet (20 mg total) by mouth daily.  . furosemide (LASIX) 20 MG tablet Take 1 tablet (20 mg total) by mouth daily as needed for fluid or edema.  . hydrocortisone (ANUSOL-HC) 2.5 % rectal cream Place 1 application rectally 3 (three) times daily.  . Inulin (FIBER CHOICE FRUITY BITES) 1.5 g CHEW Take as directed  . loratadine (CLARITIN) 10 MG tablet Take 10 mg by mouth daily as needed for allergies.  . magnesium oxide (MAG-OX) 400 MG tablet Take 400 mg by mouth daily.  . Multiple Vitamin (MULTIVITAMIN WITH MINERALS) TABS tablet Take 1 tablet by mouth daily.  Marland Kitchen NIFEdipine (PROCARDIA XL/NIFEDICAL XL) 60 MG 24 hr tablet Take 1 tablet (60 mg total) by mouth daily.  . Rectal  Protectant-Emollient (CALMOL-4) 76-10 % SUPP Take as directed  . triamcinolone cream (KENALOG) 0.1 % Apply 1 application topically 2 (two) times daily. Left lower leg bid prn  . ZINC-VITAMIN C PO Take 1 tablet by mouth daily.  . [DISCONTINUED] fluticasone (FLONASE) 50 MCG/ACT nasal spray Place 2 sprays into both nostrils daily.  . [DISCONTINUED] metFORMIN (GLUCOPHAGE) 1000 MG tablet Take 1 tablet (1,000 mg total) by mouth daily.  . [DISCONTINUED] olmesartan (BENICAR) 20 MG tablet Take 0.5 tablets (10 mg total) by mouth in the morning and at bedtime.  . [DISCONTINUED] ONGLYZA 2.5 MG TABS tablet Take 1 tablet (2.5 mg total) by mouth daily. januvia caused diarrhea   Allergies  Allergen Reactions  . Coreg [Carvedilol]     Could not tolerate    . Hctz [Hydrochlorothiazide]     Low na 124   . Januvia [Sitagliptin] Diarrhea  . Sulfa Antibiotics   . Losartan Rash   Recent Results (from the past 2160 hour(s))  H. pylori antibody, IgG     Status: Abnormal   Collection Time: 07/13/20  9:43 AM  Result Value Ref Range   H Pylori IgG Positive (A) Negative  Lipid panel     Status: None   Collection Time: 09/07/20  8:54 AM  Result Value Ref Range   Cholesterol 112 0 - 200 mg/dL    Comment: ATP III Classification       Desirable:  < 200 mg/dL               Borderline High:  200 - 239 mg/dL          High:  > = 628 mg/dL   Triglycerides 36.6 0.0 - 149.0 mg/dL    Comment: Normal:  <294 mg/dLBorderline High:  150 - 199 mg/dL   HDL 76.54 >65.03 mg/dL   VLDL 54.6 0.0 - 56.8 mg/dL   LDL Cholesterol 50 0 - 99 mg/dL   Total CHOL/HDL Ratio 2     Comment:                Men          Women1/2 Average Risk     3.4          3.3Average Risk          5.0          4.42X Average Risk          9.6          7.13X Average Risk          15.0  11.0                       NonHDL 63.66     Comment: NOTE:  Non-HDL goal should be 30 mg/dL higher than patient's LDL goal (i.e. LDL goal of < 70 mg/dL, would have  non-HDL goal of < 100 mg/dL)  Hemoglobin [id number]     Status: None   Collection Time: 09/07/20  8:54 AM  Result Value Ref Range   Hgb A1c MFr Bld 6.4 4.6 - 6.5 %    Comment: Glycemic Control Guidelines for People with Diabetes:Non Diabetic:  <6%Goal of Therapy: <7%Additional Action Suggested:  >8%   CBC w/Diff     Status: Abnormal   Collection Time: 09/07/20  8:54 AM  Result Value Ref Range   WBC 5.0 4.0 - 10.5 K/uL   RBC 3.95 3.87 - 5.11 Mil/uL   Hemoglobin 11.8 (L) 12.0 - 15.0 g/dL   HCT 09.634.4 (L) 04.536.0 - 40.946.0 %   MCV 87.1 78.0 - 100.0 fl   MCHC 34.4 30.0 - 36.0 g/dL   RDW 81.113.2 91.411.5 - 78.215.5 %   Platelets 287.0 150.0 - 400.0 K/uL   Neutrophils Relative % 57.1 43.0 - 77.0 %   Lymphocytes Relative 34.5 12.0 - 46.0 %   Monocytes Relative 6.5 3.0 - 12.0 %   Eosinophils Relative 1.3 0.0 - 5.0 %   Basophils Relative 0.6 0.0 - 3.0 %   Neutro Abs 2.9 1.4 - 7.7 K/uL   Lymphs Abs 1.7 0.7 - 4.0 K/uL   Monocytes Absolute 0.3 0.1 - 1.0 K/uL   Eosinophils Absolute 0.1 0.0 - 0.7 K/uL   Basophils Absolute 0.0 0.0 - 0.1 K/uL  TSH     Status: None   Collection Time: 09/07/20  8:54 AM  Result Value Ref Range   TSH 0.73 0.35 - 4.50 uIU/mL  Microalbumin / creatinine urine ratio     Status: None   Collection Time: 09/07/20  8:54 AM  Result Value Ref Range   Microalb, Ur 0.9 0.0 - 1.9 mg/dL   Creatinine,U 95.634.4 mg/dL   Microalb Creat Ratio 2.5 0.0 - 30.0 mg/g  Troponin I (High Sensitivity)     Status: None   Collection Time: 09/07/20  8:54 AM  Result Value Ref Range   High Sens Troponin I 4 2 - 17 ng/L  H. pylori antigen, stool     Status: None   Collection Time: 09/08/20 10:42 AM  Result Value Ref Range   H pylori Ag, Stl Negative Negative   Objective  Body mass index is 31.78 kg/m. Wt Readings from Last 3 Encounters:  10/04/20 168 lb 3.2 oz (76.3 kg)  09/07/20 164 lb (74.4 kg)  09/01/20 168 lb 3.2 oz (76.3 kg)   Temp Readings from Last 3 Encounters:  10/04/20 (!) 97.5 F (36.4 C) (Oral)   09/01/20 98.2 F (36.8 C) (Oral)  05/17/20 97.8 F (36.6 C) (Oral)   BP Readings from Last 3 Encounters:  10/04/20 140/80  09/07/20 (!) 144/76  09/01/20 (!) 160/94   Pulse Readings from Last 3 Encounters:  10/04/20 76  09/07/20 84  09/01/20 85    Physical Exam Vitals and nursing note reviewed.  Constitutional:      Appearance: Normal appearance. She is well-developed and well-groomed. She is obese.  HENT:     Head: Normocephalic and atraumatic.  Cardiovascular:     Rate and Rhythm: Normal rate and regular rhythm.     Heart sounds:  Normal heart sounds. No murmur heard.   Pulmonary:     Effort: Pulmonary effort is normal.     Breath sounds: Normal breath sounds.  Abdominal:     Tenderness: There is no abdominal tenderness.  Skin:    General: Skin is warm and dry.  Neurological:     General: No focal deficit present.     Mental Status: She is alert and oriented to person, place, and time. Mental status is at baseline.     Gait: Gait normal.  Psychiatric:        Attention and Perception: Attention and perception normal.        Mood and Affect: Mood and affect normal.        Speech: Speech normal.        Behavior: Behavior normal. Behavior is cooperative.        Thought Content: Thought content normal.        Cognition and Memory: Cognition and memory normal.        Judgment: Judgment normal.     Assessment  Plan  Hypertension, unspecified type - Plan: NIFEdipine (PROCARDIA XL/NIFEDICAL XL) 60 MG 24 hr tablet, olmesartan (BENICAR) 5 MG tablet (2 pills) bid  Fu renal Dr. Ronn Melena 5/17 and me after this appt  Monitor BP  Allergic rhinitis, unspecified seasonality, unspecified trigger - Plan: fluticasone (FLONASE) 50 MCG/ACT nasal spray  Controlled type 2 diabetes mellitus without complication, without long-term current use of insulin (HCC) - Plan: metFORMIN (GLUCOPHAGE) 1000 MG tablet -stop onglyza 2.5 mg qd  Insurance will not cover and A1C improved to  6.4  HM Flu shot declines prevnar utd  pna 25utd no need to updatedue to age per guidelines  Tdap utd  Consider shingrix vaccines in future covid 193/48modernaconsider booster  Eye MD seen Dr Rosalene Billings, [facility] MD 04/11/20  Glaucoma suspect  Pap 05/13/19 negative neg HPV   mammo2/2/22neg  Colonoscopy had in3/25/2013per pt she was ~61 per chart review was normal though I am unable to review report pt states she [facility] at [facility] -need to get copy of reportdue 09/29/21  dexa2/2/22 negative Never smoker but exposed to 2nd hand via husband 3/4/21renal US duplex normal  09/19/15 thyroid US with 10/04/15 benign bx IMPRESSION: 1. Thyromegaly with bilateral small nodules. Dominant right lesion meets consensus criteria for biopsy. Ultrasound-guided fine needle aspiration should be considered, as per the consensus statement: Management of Thyroid Nodules Detected at [place]: Society of Radiologists in Ultrasound Consensus Conference Statement. Radiology 2005; [phone]  [facility] [address] Henreitta Leber  Provider: Dr. French Ana [name]-Internal Medicine

## 2020-10-05 ENCOUNTER — Telehealth: Payer: Self-pay | Admitting: Internal Medicine

## 2020-10-05 NOTE — Telephone Encounter (Signed)
Spoke with patient, patient verbalized understanding and will give cardiology a call for clearance.

## 2020-10-05 NOTE — Telephone Encounter (Signed)
-----   Message from Benancio Deeds, MD sent at 10/05/2020  8:03 AM EDT ----- Mayo Ao, Thanks for reaching out. I saw her in January for rectal bleeding. I think most likely due to hemorrhoids, but offered her the colonoscopy then given she has not had an exam in a while. She was scheduled then cancelled. Given her recent bleeding symptoms I think reasonable to do now if she is willing, but up to her. If she does not want to proceed now we can have her see [place] next year to schedule. Thanks  ----- Message ----- From: [name], [name], MD Sent: 10/04/2020  12:32 PM EDT To: Benancio Deeds, MD  Pt doing ok I believe colonoscopy last in 09/2011 so I think needs to be done 09/2021 would you agree and doing well from GI standpoint so I think for now no f/u but when its time for colonoscopy wants it in the hospital

## 2020-10-05 NOTE — Telephone Encounter (Signed)
Does she want to do colonoscopy now GI recommends if wanted call [facility] Dr. Adela Lank to schedule  See his message below   Hi French Ana,  Thanks for reaching out. I saw her in January for rectal bleeding. I think most likely due to hemorrhoids, but offered her the colonoscopy then given she has not had an exam in a while. She was scheduled then cancelled. Given her recent bleeding symptoms I think reasonable to do now if she is willing, but up to her. If she does not want to proceed now we can have her see [place] next year to schedule. Thanks

## 2020-10-05 NOTE — Telephone Encounter (Signed)
Pt agreeable with colonoscopy, prefers to do it at the hospital. Was told she would need to check with cardiology before having a colonoscopy but unsure if that was actually needed.

## 2020-10-05 NOTE — Telephone Encounter (Signed)
Pt needs to call cardiology for appt for cardiac clearance before in hospital colonoscopy with Dr. Adela Lank and have cardiology fax note to Dr. [facility]

## 2020-10-10 ENCOUNTER — Ambulatory Visit: Payer: Medicare Other | Admitting: Internal Medicine

## 2020-10-10 DIAGNOSIS — E119 Type 2 diabetes mellitus without complications: Secondary | ICD-10-CM | POA: Diagnosis not present

## 2020-10-10 DIAGNOSIS — I10 Essential (primary) hypertension: Secondary | ICD-10-CM | POA: Diagnosis not present

## 2020-10-10 DIAGNOSIS — N189 Chronic kidney disease, unspecified: Secondary | ICD-10-CM | POA: Diagnosis not present

## 2020-10-10 DIAGNOSIS — I129 Hypertensive chronic kidney disease with stage 1 through stage 4 chronic kidney disease, or unspecified chronic kidney disease: Secondary | ICD-10-CM | POA: Diagnosis not present

## 2020-10-11 DIAGNOSIS — R42 Dizziness and giddiness: Secondary | ICD-10-CM | POA: Diagnosis not present

## 2020-10-11 DIAGNOSIS — E119 Type 2 diabetes mellitus without complications: Secondary | ICD-10-CM | POA: Diagnosis not present

## 2020-10-11 DIAGNOSIS — R0600 Dyspnea, unspecified: Secondary | ICD-10-CM | POA: Diagnosis not present

## 2020-10-11 DIAGNOSIS — R600 Localized edema: Secondary | ICD-10-CM | POA: Diagnosis not present

## 2020-10-11 DIAGNOSIS — I10 Essential (primary) hypertension: Secondary | ICD-10-CM | POA: Diagnosis not present

## 2020-10-11 DIAGNOSIS — N1831 Chronic kidney disease, stage 3a: Secondary | ICD-10-CM | POA: Diagnosis not present

## 2020-10-11 DIAGNOSIS — E669 Obesity, unspecified: Secondary | ICD-10-CM | POA: Diagnosis not present

## 2020-10-11 DIAGNOSIS — R002 Palpitations: Secondary | ICD-10-CM | POA: Diagnosis not present

## 2020-10-17 ENCOUNTER — Telehealth: Payer: Self-pay | Admitting: Gastroenterology

## 2020-10-17 NOTE — Telephone Encounter (Signed)
Spoke to patient. Patient provided [facility] telephone number [phone] to schedule GI appt..   Pt states she was provided good care at [facility]. Patient states it's just convenient for her to transfer care to [facility] due to transportation.   I informed patient [facility] understands, and she verbalized understanding.Marland Kitchen

## 2020-10-19 DIAGNOSIS — E113213 Type 2 diabetes mellitus with mild nonproliferative diabetic retinopathy with macular edema, bilateral: Secondary | ICD-10-CM | POA: Diagnosis not present

## 2020-10-19 DIAGNOSIS — E113211 Type 2 diabetes mellitus with mild nonproliferative diabetic retinopathy with macular edema, right eye: Secondary | ICD-10-CM | POA: Diagnosis not present

## 2020-10-19 DIAGNOSIS — H25013 Cortical age-related cataract, bilateral: Secondary | ICD-10-CM | POA: Diagnosis not present

## 2020-10-19 DIAGNOSIS — H3581 Retinal edema: Secondary | ICD-10-CM | POA: Diagnosis not present

## 2020-10-30 ENCOUNTER — Other Ambulatory Visit: Payer: Self-pay

## 2020-10-30 ENCOUNTER — Ambulatory Visit (INDEPENDENT_AMBULATORY_CARE_PROVIDER_SITE_OTHER): Payer: Medicare Other | Admitting: Gastroenterology

## 2020-10-30 ENCOUNTER — Encounter: Payer: Self-pay | Admitting: Gastroenterology

## 2020-10-30 VITALS — BP 180/90 | HR 83 | Temp 98.5°F | Ht 61.0 in | Wt 164.0 lb

## 2020-10-30 DIAGNOSIS — D649 Anemia, unspecified: Secondary | ICD-10-CM

## 2020-10-30 DIAGNOSIS — K625 Hemorrhage of anus and rectum: Secondary | ICD-10-CM | POA: Diagnosis not present

## 2020-10-30 DIAGNOSIS — R109 Unspecified abdominal pain: Secondary | ICD-10-CM | POA: Diagnosis not present

## 2020-10-30 MED ORDER — POLYETHYLENE GLYCOL POWD: 17.0000 g | Freq: Every day | 5 refills | Status: DC

## 2020-10-30 NOTE — Patient Instructions (Signed)
Please take Miralax 17 G daily.    High-Fiber Eating Plan Fiber, also called dietary fiber, is a type of carbohydrate. It is found foods such as fruits, vegetables, whole grains, and beans. A high-fiber diet can have many health benefits. Your health care provider may recommend a high-fiber diet to help:  Prevent constipation. Fiber can make your bowel movements more regular.  Lower your cholesterol.  Relieve the following conditions: ? Inflammation of veins in the anus (hemorrhoids). ? Inflammation of specific areas of the digestive tract (uncomplicated diverticulosis). ? A problem of the large intestine, also called the colon, that sometimes causes pain and diarrhea (irritable bowel syndrome, or IBS).  Prevent overeating as part of a weight-loss plan.  Prevent heart disease, type 2 diabetes, and certain cancers. What are tips for following this plan? Reading food labels  Check the nutrition facts label on food products for the amount of dietary fiber. Choose foods that have 5 grams of fiber or more per serving.  The goals for recommended daily fiber intake include: ? Men (age 28 or younger): 34-38 g. ? Men (over age 23): 28-34 g. ? Women (age 88 or younger): 25-28 g. ? Women (over age 27): 22-25 g. Your daily fiber goal is _____________ g.   Shopping  Choose whole fruits and vegetables instead of processed forms, such as apple juice or applesauce.  Choose a wide variety of high-fiber foods such as avocados, lentils, oats, and kidney beans.  Read the nutrition facts label of the foods you choose. Be aware of foods with added fiber. These foods often have high sugar and sodium amounts per serving. Cooking  Use whole-grain flour for baking and cooking.  Cook with brown rice instead of white rice. Meal planning  Start the day with a breakfast that is high in fiber, such as a cereal that contains 5 g of fiber or more per serving.  Eat breads and cereals that are made with  whole-grain flour instead of refined flour or white flour.  Eat brown rice, bulgur wheat, or millet instead of white rice.  Use beans in place of meat in soups, salads, and pasta dishes.  Be sure that half of the grains you eat each day are whole grains. General information  You can get the recommended daily intake of dietary fiber by: ? Eating a variety of fruits, vegetables, grains, nuts, and beans. ? Taking a fiber supplement if you are not able to take in enough fiber in your diet. It is better to get fiber through food than from a supplement.  Gradually increase how much fiber you consume. If you increase your intake of dietary fiber too quickly, you may have bloating, cramping, or gas.  Drink plenty of water to help you digest fiber.  Choose high-fiber snacks, such as berries, raw vegetables, nuts, and popcorn. What foods should I eat? Fruits Berries. Pears. Apples. Oranges. Avocado. Prunes and raisins. Dried figs. Vegetables Sweet potatoes. Spinach. Kale. Artichokes. Cabbage. Broccoli. Cauliflower. Green peas. Carrots. Squash. Grains Whole-grain breads. Multigrain cereal. Oats and oatmeal. Brown rice. Barley. Bulgur wheat. Millet. Quinoa. Bran muffins. Popcorn. Rye wafer crackers. Meats and other proteins Navy beans, kidney beans, and pinto beans. Soybeans. Split peas. Lentils. Nuts and seeds. Dairy Fiber-fortified yogurt. Beverages Fiber-fortified soy milk. Fiber-fortified orange juice. Other foods Fiber bars. The items listed above may not be a complete list of recommended foods and beverages. Contact a dietitian for more information. What foods should I avoid? Fruits Fruit juice. Cooked, strained  fruit. Vegetables Fried potatoes. Canned vegetables. Well-cooked vegetables. Grains White bread. Pasta made with refined flour. White rice. Meats and other proteins Fatty cuts of meat. Fried chicken or fried fish. Dairy Milk. Yogurt. Cream cheese. Sour cream. Fats and  oils Butters. Beverages Soft drinks. Other foods Cakes and pastries. The items listed above may not be a complete list of foods and beverages to avoid. Talk with your dietitian about what choices are best for you. Summary  Fiber is a type of carbohydrate. It is found in foods such as fruits, vegetables, whole grains, and beans.  A high-fiber diet has many benefits. It can help to prevent constipation, lower blood cholesterol, aid weight loss, and reduce your risk of heart disease, diabetes, and certain cancers.  Increase your intake of fiber gradually. Increasing fiber too quickly may cause cramping, bloating, and gas. Drink plenty of water while you increase the amount of fiber you consume.  The best sources of fiber include whole fruits and vegetables, whole grains, nuts, seeds, and beans. This information is not intended to replace advice given to you by your health care provider. Make sure you discuss any questions you have with your health care provider. Document Revised: 10/28/2019 Document Reviewed: 10/28/2019 Elsevier Patient Education  2021 [employer].

## 2020-10-31 LAB — IRON AND TIBC
Iron Saturation: 26 % (ref 15–55)
Iron: 91 ug/dL (ref 27–139)
Total Iron Binding Capacity: 344 ug/dL (ref 250–450)
UIBC: 253 ug/dL (ref 118–369)

## 2020-10-31 LAB — FERRITIN: Ferritin: 36 ng/mL (ref 15–150)

## 2020-10-31 NOTE — Progress Notes (Signed)
Natasha Phillips [address]  [address]  [place], [place] [address]  Main: [phone]  Fax: [phone]   Gastroenterology Consultation  Referring Provider:     [name], French Anaracy * Primary Care Physician:  [name], Pasty Spillersracy N, MD Reason for Consultation:     Abdominal pain, blood in stools        HPI:    Chief Complaint  Patient presents with  . Blood In Stools    Natasha Phillips is a [age] y.o. y/o female referred for consultation & management  by Dr. Judie GrieveMcLean-Scocuzza, Pasty Spillersracy N, MD.  Patient recently seen by [facility] for abdominal pain and blood in stool.  Our location is closer to her and patient is transferring care here.  Patient reports bilateral upper quadrant abdominal pain.  H. pylori IgG antibody was positive in January 2022 and she was treated with amoxicillin, clarithromycin, Flagyl, and omeprazole.  H. pylori stool antigen was negative in March 2022.  She continued to have abdominal pain after this.  She was placed on Pepcid which she states helped the pain somewhat, but she continues to have symptoms.  Unrelated to meals.  No nausea or vomiting.  No weight loss.  Also reports chronic constipation and has been taking fiber supplements, but still continues to go 2 to 3 days without a bowel movement, but when she does have a bowel movement, they are soft but she does sometimes strain.  Has noticed small blood streaks in stool.  EGD and colonoscopy was recommended by [facility], but patient has not had this done yet.  As per patient, last colonoscopy was in 2013 and it was normal.  Procedure report not available.  Past Medical History:  Diagnosis Date  . Anemia   . CKD (chronic kidney disease)   . Diabetes mellitus without complication (HCC)   . Glaucoma    [facility] with macular edema and Dr. Frederico Hammanenika Johnson sees Q3-4 months  . Hypertension     Past Surgical History:  Procedure Laterality Date  . BIOPSY THYROID    . CESAREAN SECTION     1981 1988   . TUBAL LIGATION      Prior to Admission medications   Medication Sig Start Date End Date Taking? Authorizing Provider  aspirin EC 81 MG tablet Take 81 mg by mouth daily.   Yes [provider]  ELDERBERRY PO Take by mouth.   Yes [provider]  famotidine (PEPCID) 20 MG tablet Take 1 tablet (20 mg total) by mouth daily. 07/13/20  Yes Armbruster, Willaim RayasSteven P, MD  Ferrous Sulfate 27 MG TABS Take by mouth.   Yes [provider]  fluticasone (FLONASE) 50 MCG/ACT nasal spray Place 2 sprays into both nostrils daily. prn 10/04/20  Yes [name], Pasty Spillersracy N, MD  furosemide (LASIX) 20 MG tablet Take 1 tablet (20 mg total) by mouth daily as needed for fluid or edema. 07/13/19  Yes Margaretann LovelessBurnette, Jennifer M, PA-C  hydrocortisone (ANUSOL-HC) 2.5 % rectal cream Place 1 application rectally 3 (three) times daily. 05/17/20  Yes [name], Pasty Spillersracy N, MD  Inulin (FIBER CHOICE FRUITY BITES) 1.5 g CHEW Take as directed 07/13/20  Yes Armbruster, Willaim RayasSteven P, MD  loratadine (CLARITIN) 10 MG tablet Take 10 mg by mouth daily as needed for allergies.   Yes [provider]  magnesium oxide (MAG-OX) 400 MG tablet Take 400 mg by mouth daily.   Yes [provider]  metFORMIN (GLUCOPHAGE) 1000 MG tablet Take 1 tablet (1,000 mg total)  by mouth daily. 10/04/20  Yes [name], [name], MD  Multiple Vitamin (MULTIVITAMIN WITH MINERALS) TABS tablet Take 1 tablet by mouth daily.   Yes [provider]  olmesartan (BENICAR) 5 MG tablet Take 2 tablets (10 mg total) by mouth in the morning and at bedtime. 10/04/20 10/04/21 Yes [name], [name], MD  Polyethylene Glycol POWD Take 17 g by mouth daily. 10/30/20  Yes [name], MD  Rectal Protectant-Emollient (CALMOL-4) 76-10 % SUPP Take as directed 07/13/20  Yes Armbruster, Willaim Rayas, MD  saxagliptin HCl (ONGLYZA) 2.5 MG TABS tablet Take 1 tablet by mouth daily. 10/02/20  Yes [provider]  triamcinolone cream  (KENALOG) 0.1 % Apply 1 application topically 2 (two) times daily. Left lower leg bid prn 08/06/19  Yes [name], [name], MD  ZINC-VITAMIN C PO Take 1 tablet by mouth daily.   Yes [provider]  cloNIDine (CATAPRES) 0.1 MG tablet Take 1 tablet by mouth 2 (two) times daily as needed. Patient not taking: Reported on 10/30/2020 10/11/20   [provider]  NIFEdipine (PROCARDIA XL/NIFEDICAL XL) 60 MG 24 hr tablet TAKE 1 TABLET(60 MG) BY MOUTH DAILY Patient not taking: Reported on 10/30/2020 10/04/20   [name], [name], MD    Family History  Problem Relation Age of Onset  . Diabetes Mother   . Heart failure Mother   . Diabetes Sister   . Healthy Sister   . Healthy Sister   . Healthy Sister   . Diabetes Brother   . Diabetes Sister   . Heart failure Father   . Brain cancer Brother   . Throat cancer Brother   . Glaucoma Other        Runs on Paternal Side  . Breast cancer Cousin      Social History   Tobacco Use  . Smoking status: Never Smoker  . Smokeless tobacco: Never Used  Vaping Use  . Vaping Use: Never used  Substance Use Topics  . Alcohol use: No  . Drug use: No    Allergies as of 10/30/2020 - Review Complete 10/30/2020  Allergen Reaction Noted  . Coreg [carvedilol]  09/01/2020  . Hctz [hydrochlorothiazide]  07/22/2019  . Januvia [sitagliptin] Diarrhea 01/23/2016  . Sulfa antibiotics  01/11/2015  . Losartan Rash 02/10/2020    Review of Systems:    All systems reviewed and negative except where noted in HPI.   Physical Exam:  BP (!) 180/90   Pulse 83   Temp 98.5 F (36.9 C) (Oral)   Ht 5\' 1"  (1.549 m)   Wt 164 lb (74.4 kg)   BMI 30.99 kg/m  No LMP recorded. Patient is postmenopausal. Psych:  Alert and cooperative. Normal mood and affect. General:   Alert,  Well-developed, well-nourished, pleasant and cooperative in NAD Head:  Normocephalic and atraumatic. Eyes:  Sclera clear, no icterus.   Conjunctiva pink. Ears:  Normal  auditory acuity. Nose:  No deformity, discharge, or lesions. Mouth:  No deformity or lesions,oropharynx pink & moist. Neck:  Supple; no masses or thyromegaly. Abdomen:  Normal bowel sounds.  No bruits.  Soft, non-tender and non-distended without masses, hepatosplenomegaly or hernias noted.  No guarding or rebound tenderness.    Msk:  Symmetrical without gross deformities. Good, equal movement & strength bilaterally. Pulses:  Normal pulses noted. Extremities:  No clubbing or edema.  No cyanosis. Neurologic:  Alert and oriented x3;  grossly normal neurologically. Skin:  Intact without significant lesions or rashes. No jaundice. Lymph Nodes:  No significant cervical adenopathy. Psych:  Alert and cooperative. Normal mood and affect.   Labs: CBC    Component Value Date/Time   WBC 5.0 09/07/2020 0854   RBC 3.95 09/07/2020 0854   HGB 11.8 (L) 09/07/2020 0854   HGB 11.2 07/12/2019 0954   HCT 34.4 (L) 09/07/2020 0854   HCT 34.0 07/12/2019 0954   PLT 287.0 09/07/2020 0854   PLT 255 07/12/2019 0954   MCV 87.1 09/07/2020 0854   MCV 94 07/12/2019 0954   MCH 30.9 07/12/2019 0954   MCH 29.8 04/18/2019 0448   MCHC 34.4 09/07/2020 0854   RDW 13.2 09/07/2020 0854   RDW 12.5 07/12/2019 0954   LYMPHSABS 1.7 09/07/2020 0854   LYMPHSABS 1.4 07/12/2019 0954   MONOABS 0.3 09/07/2020 0854   EOSABS 0.1 09/07/2020 0854   EOSABS 0.1 07/12/2019 0954   BASOSABS 0.0 09/07/2020 0854   BASOSABS 0.0 07/12/2019 0954   CMP     Component Value Date/Time   NA 137 08/06/2019 1343   NA 141 07/12/2019 0954   K 4.2 08/06/2019 1343   CL 102 08/06/2019 1343   CO2 30 08/06/2019 1343   GLUCOSE 90 08/06/2019 1343   BUN 29 (H) 08/06/2019 1343   BUN 28 (H) 07/12/2019 0954   CREATININE 1.09 08/06/2019 1343   CALCIUM 10.5 08/06/2019 1343   PROT 8.3 08/24/2019 1158   ALBUMIN 4.2 07/12/2019 0954   AST 18 07/12/2019 0954   ALT 14 07/12/2019 0954   ALKPHOS 108 07/12/2019 0954   BILITOT 0.2 07/12/2019 0954    GFRNONAA 55 (L) 07/12/2019 0954   GFRAA 63 07/12/2019 0954    Imaging Studies: No results found.  Assessment and Plan:   ERNESTO LASHWAY is a 71 y.o. y/o female has been referred for abdominal pain, blood in stool  Given patient's ongoing abdominal pain, and previously recommended upper endoscopy with [facility], we did discuss next steps steps including upper endoscopy for biopsies for H. pylori.  Her IgG H. pylori antibody was positive in January 2022, but this could be from previous infection.  This was treated.  Patient would like to proceed with conservative management at this time.  Okay to continue Pepcid that has already been prescribed to her  Given intermittent bright red blood per rectum, despite fiber supplementation, I have also discussed colonoscopy to rule out any underlying lesions.  Patient states she would like to continue conservative management at this time and refuses to schedule colonoscopy, but states she will think about it and call us back when she is ready to schedule, or if she continues to have ongoing symptoms.  It has also been at least 9 years since her last screening colonoscopy  Some of her abdominal pain may be as a result of her constipation given that she does not have a bowel movement for 2 to 3 days and that she is straining with her bowel movements.  High-fiber diet MiraLAX daily with goal of 1-2 soft bowel movements daily.  If not at goal, patient instructed to increase dose to twice daily.  If loose stools with the medication, patient asked to decrease the medication to every other day, or half dose daily.  Patient verbalized understanding  If symptoms continue despite above, I have encouraged her to proceed with EGD and colonoscopy  Given that her CBC did show mild anemia recently.  I did check ferritin and iron labs, which are normal.  Follow-up with PCP for normocytic anemia   Dr Michel Bickers  [facility]  Speech recognition software was used to  dictate the above note.

## 2020-11-01 DIAGNOSIS — I1310 Hypertensive heart and chronic kidney disease without heart failure, with stage 1 through stage 4 chronic kidney disease, or unspecified chronic kidney disease: Secondary | ICD-10-CM | POA: Diagnosis not present

## 2020-11-02 ENCOUNTER — Telehealth: Payer: Medicare Other

## 2020-11-02 ENCOUNTER — Telehealth: Payer: Self-pay | Admitting: Pharmacist

## 2020-11-02 NOTE — Telephone Encounter (Signed)
  Chronic Care Management   Note  11/02/2020 Name: Natasha Phillips MRN: [id number] DOB: 1951/03/17   Attempted to contact patient for scheduled appointment for medication management support. She had forgotten about our appointment and was getting ready to leave the house. Requested to reschedule. Rescheduled for next available.  Plan: - If I do not hear back from the patient by end of business today, will collaborate with Care Guide to outreach to schedule follow up with me   Catie Feliz Beam, PharmD, [place], CPP Clinical Pharmacist [employer] [facility] [employer] [phone]

## 2020-11-03 ENCOUNTER — Telehealth: Payer: Self-pay | Admitting: Internal Medicine

## 2020-11-03 ENCOUNTER — Ambulatory Visit: Payer: Medicare Other | Admitting: Pharmacist

## 2020-11-03 DIAGNOSIS — I10 Essential (primary) hypertension: Secondary | ICD-10-CM

## 2020-11-03 DIAGNOSIS — E119 Type 2 diabetes mellitus without complications: Secondary | ICD-10-CM

## 2020-11-03 DIAGNOSIS — N1831 Chronic kidney disease, stage 3a: Secondary | ICD-10-CM

## 2020-11-03 NOTE — Telephone Encounter (Signed)
See note Dr. French Ana [name]

## 2020-11-03 NOTE — Telephone Encounter (Signed)
See CCM documentation 

## 2020-11-03 NOTE — Chronic Care Management (AMB) (Signed)
Chronic Care Management Pharmacy Note  11/03/2020 Name:  Natasha Phillips MRN:  [id number] DOB:  11/17/1950  Subjective: Natasha Phillips is an 70 y.o. year old female who is a primary patient of [name], Nino Glow, MD.  The CCM team was consulted for assistance with disease management and care coordination needs.    Engaged with patient by telephone for returning a call in response to provider referral for pharmacy case management and/or care coordination services.   Consent to Services:  The patient was given information about Chronic Care Management services, agreed to services, and gave verbal consent prior to initiation of services.  Please see initial visit note for detailed documentation.   Patient Care Team: [name], Nino Glow, MD as PCP - General (Internal Medicine) Malachy Mood, MD as Referring Physician (Obstetrics and Gynecology)  Recent office visits:  3/30 - PCP noted - intolerance to bystolic, amlodipine, losartan, coreg, propanolol, HCTZ may have caused hyponatremia. Change to nifedipine. off Onglyza  Recent consult visits:  3/23- nephrology Dr. Juleen China - avoid NSAID, restart olmesartan 10 mg BID, start nebivolol 2.5 mg QPM (reported hx chest tightness with amlodipine)  3/23 - neurology Dr. Manuella Ghazi- BPPV - tx w/ meclizine, diphenhydramine, add mag oxide for cramps if OK w/ neph  4/5 [facility] nephrology - agreed with currnet management, rec purchase upper arm monitor  4/6 [facility] cardiology - office BP 150/82, added clonidine 0.1 mg PRN SBP >150  4/25 - GI [facility] - rectal bleeding - rec colonoscopy, patient refused- rec high fiber diet, add Miralax daily, can incrase to BID  4/27 - [facility] nephr- increase olmesartan to 15 mg BID, rec bring home meter to office for validation at next appt    Hospital visits: None in previous 6 months  Objective:  Lab Results  Component Value Date   CREATININE 1.09 08/06/2019   CREATININE 1.06 (H) 07/12/2019    CREATININE 1.17 (H) 05/03/2019    Lab Results  Component Value Date   HGBA1C 6.4 09/07/2020   Last diabetic Eye exam:  Lab Results  Component Value Date/Time   HMDIABEYEEXA Retinopathy (A) 07/31/2018 12:00 AM    Last diabetic Foot exam: No results found for: HMDIABFOOTEX      Component Value Date/Time   CHOL 112 09/07/2020 0854   CHOL 128 08/11/2019 0834   TRIG 66.0 09/07/2020 0854   HDL 48.30 09/07/2020 0854   HDL 62 08/11/2019 0834   CHOLHDL 2 09/07/2020 0854   VLDL 13.2 09/07/2020 0854   LDLCALC 50 09/07/2020 0854   LDLCALC 55 08/11/2019 0834    Hepatic Function Latest Ref Rng & Units 08/24/2019 07/12/2019 05/03/2019  Total Protein 6.0 - 8.5 g/dL 8.3 7.5 -  Albumin 3.8 - 4.8 g/dL - 4.2 4.4  AST 0 - 40 IU/L - 18 -  ALT 0 - 32 IU/L - 14 -  Alk Phosphatase 39 - 117 IU/L - 108 -  Total Bilirubin 0.0 - 1.2 mg/dL - 0.2 -    Lab Results  Component Value Date/Time   TSH 0.73 09/07/2020 08:54 AM   TSH 1.850 07/12/2019 09:54 AM    CBC Latest Ref Rng & Units 09/07/2020 07/12/2019 05/03/2019  WBC 4.0 - 10.5 K/uL 5.0 5.2 4.2  Hemoglobin 12.0 - 15.0 g/dL 11.8(L) 11.2 11.0(L)  Hematocrit 36.0 - 46.0 % 34.4(L) 34.0 34.2  Platelets 150.0 - 400.0 K/uL 287.0 255 267    Lab Results  Component Value Date/Time   VD25OH 41.2 02/02/2019 09:50 AM  VD25OH 39.0 10/07/2017 10:39 AM    Clinical ASCVD: No    Social History   Tobacco Use  Smoking Status Never Smoker  Smokeless Tobacco Never Used   BP Readings from Last 3 Encounters:  10/30/20 (!) 180/90  10/04/20 140/80  09/07/20 (!) 144/76   Pulse Readings from Last 3 Encounters:  10/30/20 83  10/04/20 76  09/07/20 84   Wt Readings from Last 3 Encounters:  10/30/20 164 lb (74.4 kg)  10/04/20 168 lb 3.2 oz (76.3 kg)  09/07/20 164 lb (74.4 kg)    Assessment: Review of patient past medical history, allergies, medications, health status, including review of consultants reports, laboratory and other test data, was  performed as part of comprehensive evaluation and provision of chronic care management services.   SDOH:  (Social Determinants of Health) assessments and interventions performed: none today   CCM Care Plan  Allergies  Allergen Reactions  . Coreg [Carvedilol]     Could not tolerate    . Hctz [Hydrochlorothiazide]     Low na 124   . Januvia [Sitagliptin] Diarrhea  . Sulfa Antibiotics   . Losartan Rash    Medications Reviewed Today    Reviewed by Wayna Chalet, [place] (Certified Medical Assistant) on 10/30/20 at [place] List Status: <None>  Medication Order Taking? Sig Documenting Provider Last Dose Status Informant  aspirin EC 81 MG tablet [id number] Yes Take 81 mg by mouth daily. [provider] Taking Active   cloNIDine (CATAPRES) 0.1 MG tablet [id number] No Take 1 tablet by mouth 2 (two) times daily as needed.  Patient not taking: Reported on 10/30/2020   [provider] Not Taking Active   ELDERBERRY PO [id number] Yes Take by mouth. [provider] Taking Active   famotidine (PEPCID) 20 MG tablet [id number] Yes Take 1 tablet (20 mg total) by mouth daily. Yetta Flock, MD Taking Active   Ferrous Sulfate 27 MG TABS [id number] Yes Take by mouth. [provider] Taking Active   fluticasone (FLONASE) 50 MCG/ACT nasal spray [id number] Yes Place 2 sprays into both nostrils daily. prn [name], Nino Glow, MD Taking Active   furosemide (LASIX) 20 MG tablet [id number] Yes Take 1 tablet (20 mg total) by mouth daily as needed for fluid or edema. Fenton Malling M, PA-C Taking Active   hydrocortisone (ANUSOL-HC) 2.5 % rectal cream [id number] Yes Place 1 application rectally 3 (three) times daily. [name], Nino Glow, MD Taking Active   Inulin (FIBER CHOICE FRUITY BITES) 1.5 g CHEW [id number] Yes Take as directed Armbruster, Carlota Raspberry, MD Taking Active   loratadine (CLARITIN) 10 MG tablet [id number] Yes Take 10 mg by mouth daily as needed for  allergies. [provider] Taking Active   magnesium oxide (MAG-OX) 400 MG tablet [id number] Yes Take 400 mg by mouth daily. [provider] Taking Active   metFORMIN (GLUCOPHAGE) 1000 MG tablet [id number] Yes Take 1 tablet (1,000 mg total) by mouth daily. [name], Nino Glow, MD Taking Active   Multiple Vitamin (MULTIVITAMIN WITH MINERALS) TABS tablet [id number] Yes Take 1 tablet by mouth daily. [provider] Taking Active Self  NIFEdipine (PROCARDIA XL/NIFEDICAL XL) 60 MG 24 hr tablet [id number] No TAKE 1 TABLET(60 MG) BY MOUTH DAILY  Patient not taking: Reported on 10/30/2020   [name], Nino Glow, MD Not Taking Active   olmesartan (BENICAR) 5 MG tablet [id number] Yes Take 2 tablets (10 mg total) by mouth in the morning and at bedtime. [name], Nino Glow, MD Taking Active  Polyethylene Glycol POWD [id number] Yes Take 17 g by mouth daily. Virgel Manifold, MD Taking Active   Rectal Protectant-Emollient (CALMOL-4) 76-10 % SUPP [id number] Yes Take as directed Armbruster, Carlota Raspberry, MD Taking Active   saxagliptin HCl (ONGLYZA) 2.5 MG TABS tablet [id number] Yes Take 1 tablet by mouth daily. [provider] Taking Active   triamcinolone cream (KENALOG) 0.1 % [id number] Yes Apply 1 application topically 2 (two) times daily. Left lower leg bid prn [name], Nino Glow, MD Taking Active   ZINC-VITAMIN C PO [id number] Yes Take 1 tablet by mouth daily. [provider] Taking Active           Patient Active Problem List   Diagnosis Date Noted  . H. pylori infection 09/07/2020  . Arthritis 09/01/2020  . Hypertension associated with diabetes ([place]) 05/19/2020  . Scoliosis of thoracolumbar spine 05/19/2020  . Facet hypertrophy of lumbar region 05/19/2020  . Cervical arthritis 11/26/2019  . Benign hypertensive kidney disease with chronic kidney disease 11/15/2019  . Stage 3a chronic kidney disease ([place]) 11/15/2019  . Hypertensive chronic  kidney disease with stage 1 through stage 4 chronic kidney disease, or unspecified chronic kidney disease 11/15/2019  . Pain in limb 11/12/2019  . Neck pain 08/24/2019  . Disorder of both eustachian tubes 08/24/2019  . Leg cramps 08/09/2019  . CKD (chronic kidney disease) stage 2, GFR 60-89 ml/min 08/03/2019  . Leg edema 07/22/2019  . Hyponatremia 04/17/2019  . Gastroesophageal reflux disease 09/16/2018  . Erosion of oral mucosa 10/28/2016  . Thyroid nodule 10/14/2015  . Abnormal cervical Papanicolaou smear 01/11/2015  . Absolute anemia 01/11/2015  . Mild mitral insufficiency 01/11/2015  . CN (constipation) 01/11/2015  . Calcium blood increased 01/11/2015  . LBP (low back pain) 01/11/2015  . Obesity (BMI 30-39.9) 01/11/2015  . Leg paresthesia 01/11/2015  . Neuralgia neuritis, sciatic nerve 01/11/2015  . Avitaminosis D 01/11/2015  . Essential hypertension 11/22/2014  . Controlled type 2 diabetes mellitus without complication ([place]) 62/26/3335    Immunization History  Administered Date(s) Administered  . Hepatitis B 09/20/2011, 11/01/2011, 04/03/2012  . Hepatitis B, adult 09/20/2011, 11/01/2011, 04/03/2012  . Moderna Sars-Covid-2 Vaccination 10/19/2019, 11/16/2019, 05/25/2020  . Pneumococcal Conjugate-13 03/12/2018  . Pneumococcal Polysaccharide-23 05/19/2013, 05/19/2013  . Tdap 02/07/2012    Conditions to be addressed/monitored: HTN, HLD, DMII and CKD  Care Plan : Medication Management  Updates made by De Hollingshead, RPH-CPP since 11/03/2020 12:00 AM  Completed 11/03/2020  Problem: Diabetes, HTN, CKD Resolved 11/03/2020    Long-Range Goal: Disease Progression Prevention Completed 11/03/2020  Start Date: 07/27/2020  Recent Progress: On track  Priority: High  Note:   Patient calls today to report that her insurance premium is going up, and she is convinced that it is related to "all these appointments I've been going to lately" and would like to end CCM services.  Discussed that I could investigate with our insurance department whether CCM services could have caused that, but she declines.        Medication Assistance: None required.  Patient affirms current coverage meets needs.  Patient's preferred pharmacy is:  [facility] [place], [address] [place] [address] Phone: [phone] Fax: [phone]    Follow Up:  Patient requests no follow-up at this time.  Plan: Closing CCM case per patient request. She has my contact information for future re-engagement.  [name], PharmD, BCACP, CPP Clinical Pharmacist  Therapist, music at [place]

## 2020-11-03 NOTE — Patient Instructions (Signed)
Visit Information  PATIENT GOALS: Goals Addressed              This Visit's Progress     Patient Stated   .  COMPLETED: Medication Monitoring (pt-stated)        Patient Goals/Self-Care Activities . Over the next 90 days, patient will:  - take medications as prescribed check glucose BID, document, and provide at future appointments target a minimum of 150 minutes of moderate intensity exercise weekly       The patient verbalized understanding of instructions, educational materials, and care plan provided today and declined offer to receive copy of patient instructions, educational materials, and care plan.   Plan: Closing CCM case per patient request. She has my contact information for future re-engagement.  Catie Feliz Beam, PharmD, [place], CPP Clinical Pharmacist [employer] at [employer] [phone]

## 2020-11-03 NOTE — Telephone Encounter (Signed)
PT is calling bc she states she needs to decline [facility] service due to her insurance is going up.

## 2020-11-16 ENCOUNTER — Telehealth: Payer: Medicare Other

## 2020-11-17 ENCOUNTER — Telehealth: Payer: Self-pay | Admitting: Gastroenterology

## 2020-11-17 NOTE — Telephone Encounter (Signed)
Patient would call back to discuss results.

## 2020-11-21 ENCOUNTER — Telehealth: Payer: Self-pay | Admitting: Gastroenterology

## 2020-11-21 NOTE — Telephone Encounter (Signed)
    Patient calling for lab results 

## 2020-12-08 DIAGNOSIS — Z23 Encounter for immunization: Secondary | ICD-10-CM | POA: Diagnosis not present

## 2020-12-12 IMAGING — MR MR HEAD W/O CM
11 series · 42 of 48 positions shown · non-contrast
Comparison: None.

CLINICAL DATA: Heaviness in legs and numbness around the mouth.
Dizziness for months. History of hypertension.

EXAM:
MRI HEAD WITHOUT CONTRAST
TECHNIQUE: Multiplanar, multiecho pulse sequences of the brain and surrounding
structures were obtained without intravenous contrast.

[Series 5: ax dwi_tracew · axial · 3.0mm · 0.60mm/px · z∈[-106,+35]mm · 4 of 44 slices shown]
[im 1/44]
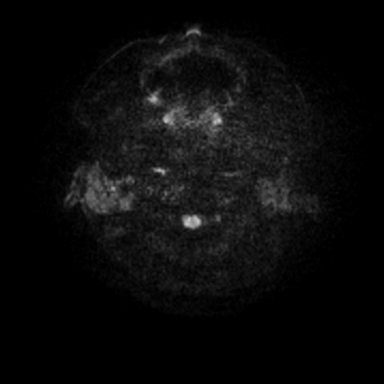
[im 15/44]
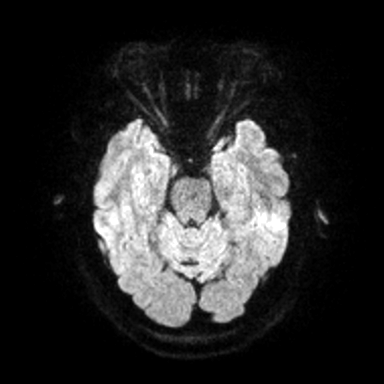
[im 29/44]
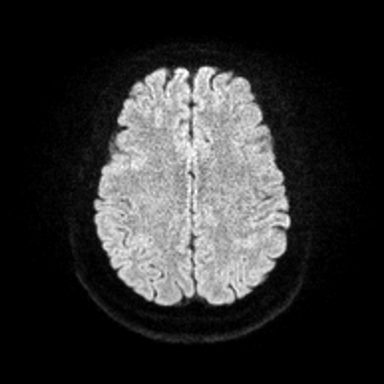
[im 44/44]
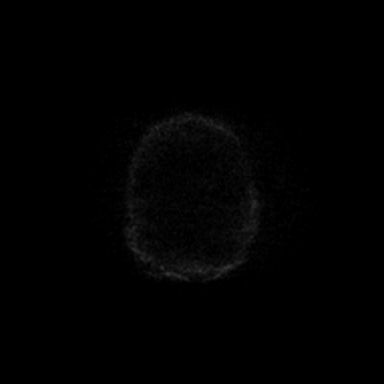

[Series 6: ax dwi_adc · axial · 3.0mm · 0.60mm/px · z∈[-106,+35]mm · 4 of 44 slices shown]
[im 1/44]
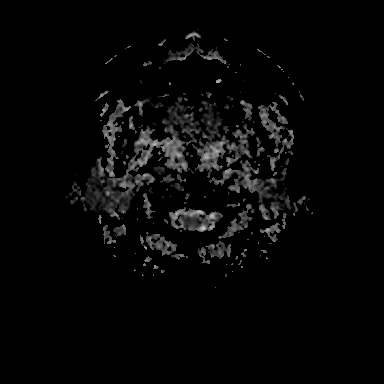
[im 15/44]
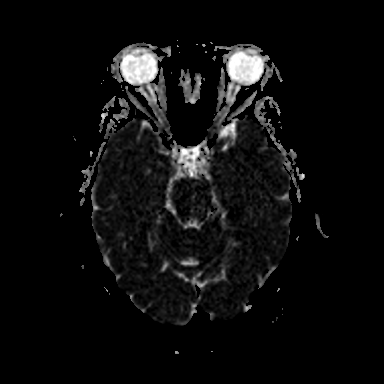
[im 29/44]
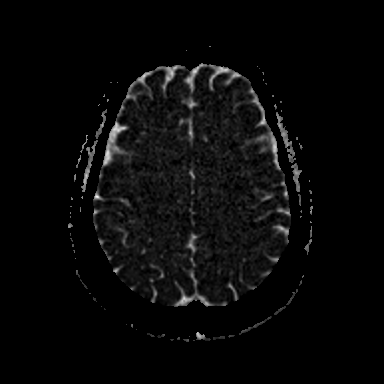
[im 44/44]
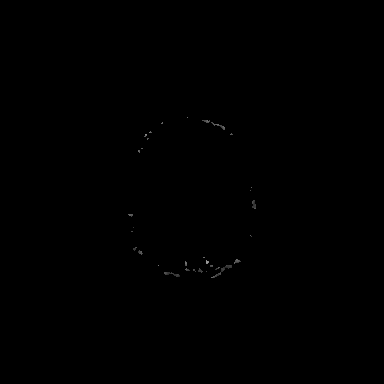

[Series 7: cor dwi_tracew · coronal · 5.0mm · 0.60mm/px · 3 of 38 slices shown]
[im 1/38]
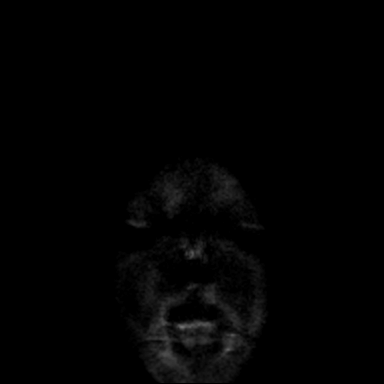
[im 19/38]
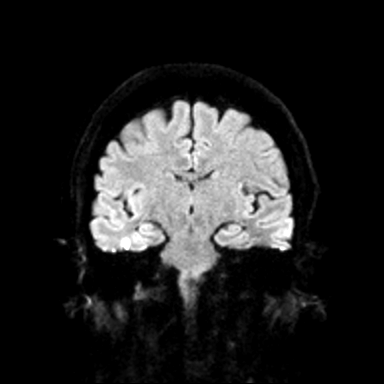
[im 38/38]
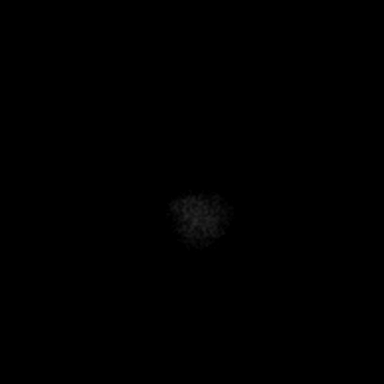

[Series 8: cor dwi_adc · coronal · 5.0mm · 0.60mm/px · 3 of 37 slices shown]
[im 1/37]
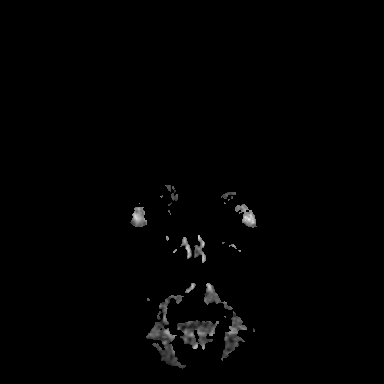
[im 19/37]
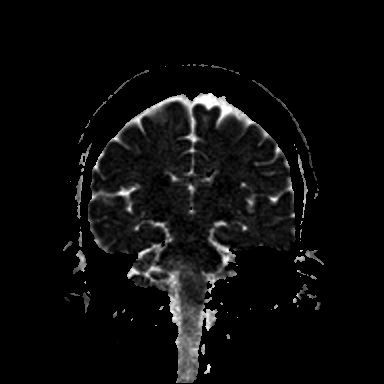
[im 37/37]
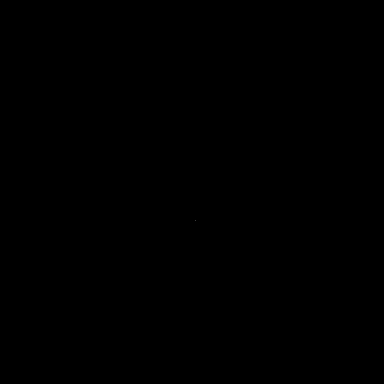

[Series 9: T1 · sagittal · 5.0mm · 0.62mm/px · 2 of 21 slices shown (1 of 2)]
[im 1/21]
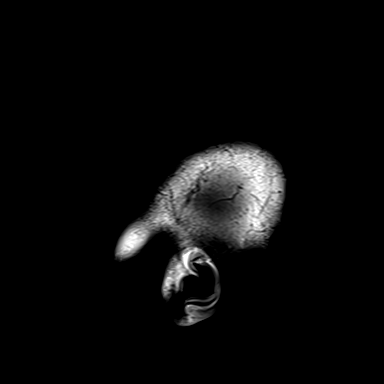
[im 21/21]
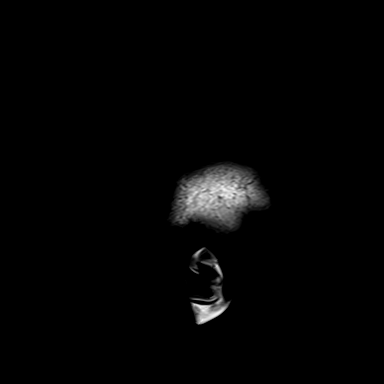

[Series 10: T2 · axial · 5.0mm · 0.53mm/px · z∈[-107,+37]mm · 2 of 25 slices shown (1 of 2)]
[im 1/25]
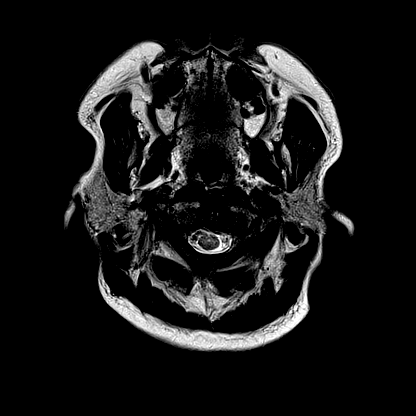
[im 25/25]
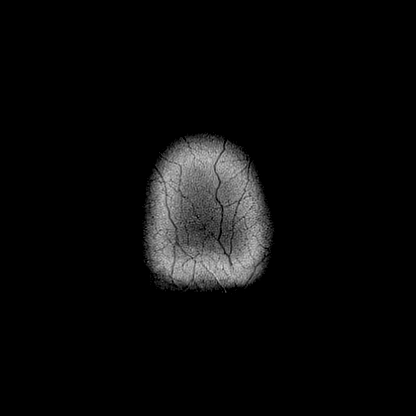

[Series 12: pha_images · axial · 3.0mm · 0.90mm/px · z∈[-121,+50]mm · 5 of 58 slices shown]
[im 1/58]
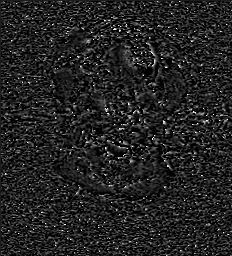
[im 15/58]
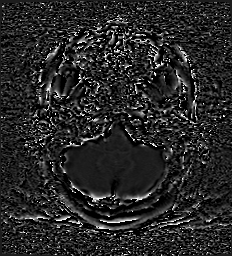
[im 29/58]
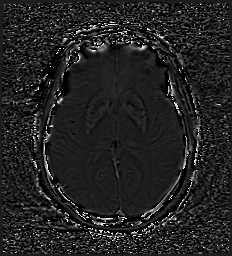
[im 43/58]
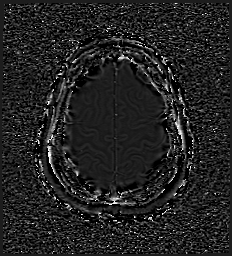
[im 58/58]
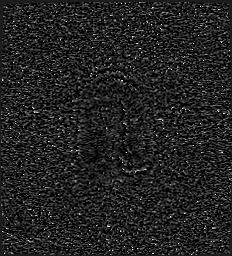

[Series 13: swi_images · axial · 3.0mm · 0.90mm/px · z∈[-124,+8]mm · 4 of 60 slices shown]
[im 1/60]
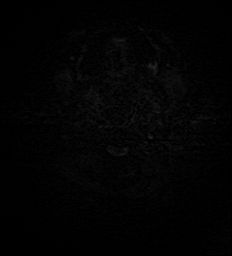
[im 15/60]
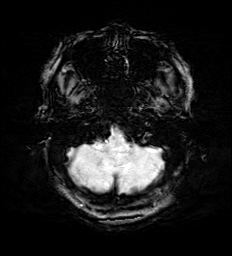
[im 30/60]
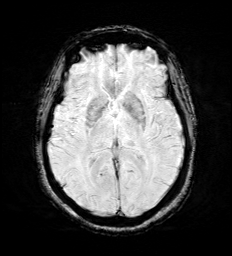
[im 45/60]
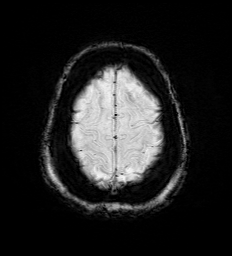

[Series 15: FLAIR · axial · 3.0mm · 0.53mm/px · z∈[-116,+46]mm · 5 of 55 slices shown]
[im 1/55]
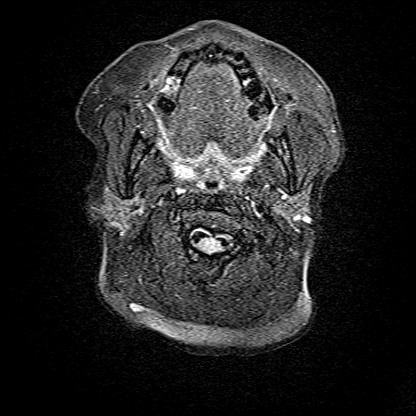
[im 14/55]
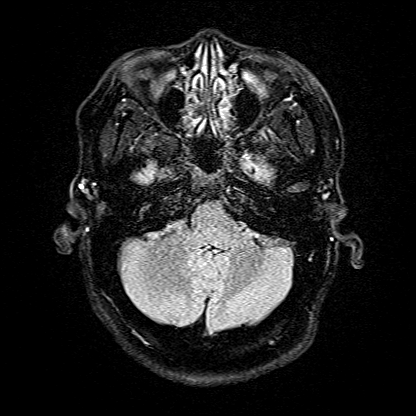
[im 28/55]
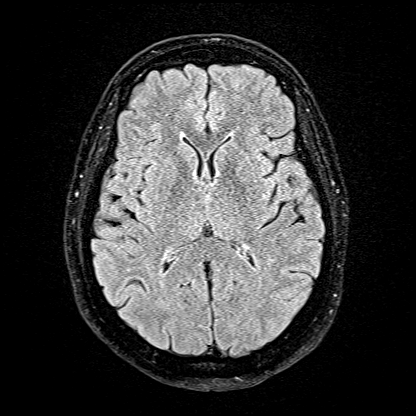
[im 41/55]
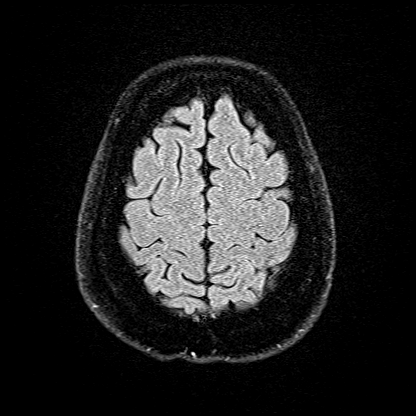
[im 55/55]
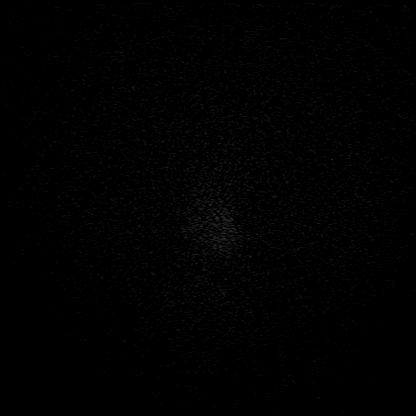

[Series 16: T1 · axial · 1.0mm · 0.98mm/px · z∈[-115,+44]mm · 8 of 160 slices shown (2 of 2)]
[im 1/160]
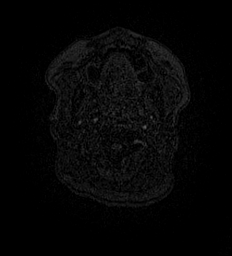
[im 27/160]
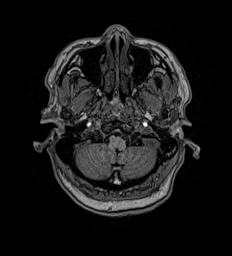
[im 54/160]
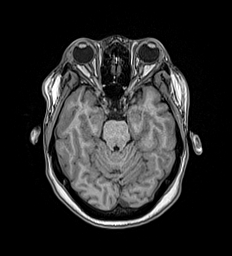
[im 67/160]
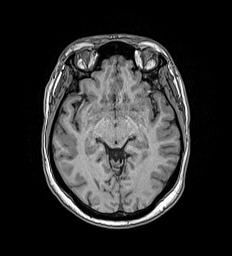
[im 93/160]
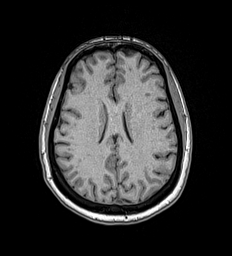
[im 107/160]
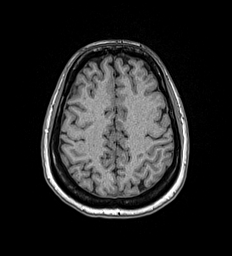
[im 133/160]
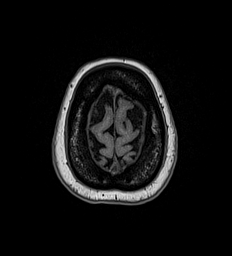
[im 160/160]
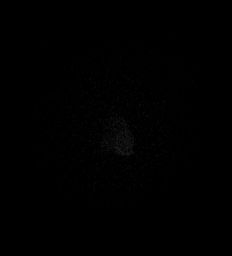

[Series 17: T2 · coronal · 5.0mm · 0.57mm/px · 2 of 28 slices shown (2 of 2)]
[im 1/28]
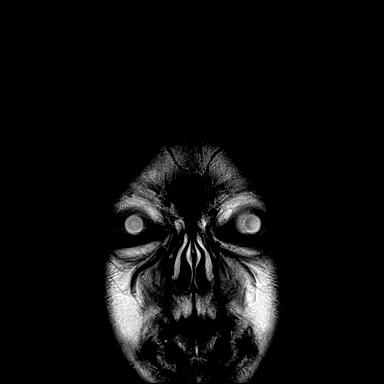
[im 28/28]
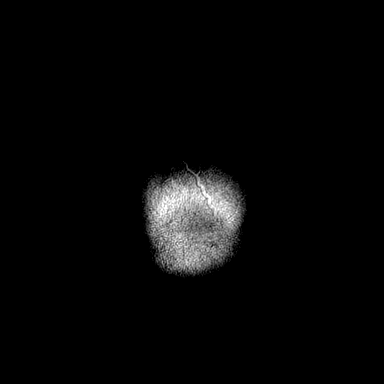

[42 of 48 positions shown; findings below may reference images not displayed]

FINDINGS: Brain: No evidence for acute infarction, hemorrhage, mass lesion,
hydrocephalus, or extra-axial fluid. Normal for age cerebral volume.
Mild subcortical and periventricular T2 and FLAIR hyperintensities,
likely chronic microvascular ischemic change.

Vascular: Flow voids are maintained throughout the carotid, basilar,
and vertebral arteries. There are no areas of chronic hemorrhage.

Skull and upper cervical spine: Unremarkable visualized calvarium,
skullbase, and cervical vertebrae. Pituitary, pineal, cerebellar
tonsils unremarkable. No upper cervical cord lesions.

Sinuses/Orbits: No orbital masses or proptosis. Globes appear
symmetric. Sinuses appear well aerated, without evidence for
air-fluid level.

Other: No nasopharyngeal pathology or mastoid fluid. Scalp and other
visualized extracranial soft tissues grossly unremarkable.
IMPRESSION: 1. Mild subcortical and periventricular T2 and FLAIR
hyperintensities, likely chronic microvascular ischemic change.
2. No acute intracranial findings.

## 2020-12-20 ENCOUNTER — Telehealth: Payer: Self-pay

## 2020-12-20 ENCOUNTER — Ambulatory Visit: Payer: Medicare Other | Admitting: Gastroenterology

## 2020-12-20 NOTE — Telephone Encounter (Signed)
Pt called and states that she has upper back pain, elevated blood pressure of 146/79, dizziness, and beeping noise in her left ear since last night. Transferred to [facility] at [facility]  and let her know that we have no appts avail today or tomorrow at the time of call.

## 2020-12-20 NOTE — Telephone Encounter (Signed)
If dizzy and BP elevated rec be seen asap at the ED  Have pt read out list of BP medications she is taking please and dose and how frequently all of them  Increase hydration with water 55-64 ounces daily  If BP elevated she may have noises in ear or check for wax

## 2020-12-20 NOTE — Telephone Encounter (Signed)
Patient was instructed by access nurse to go to ED. 

## 2020-12-21 NOTE — Telephone Encounter (Signed)
Patient states that she was originally taking the Olmesartan 5 mg two pills(10 mg)  twice daily. States this was not helping so she went to taking the Olmesartan 5 mg two pill(10 mg) three times daily. Patient then ran out of pills early and the pharmacy would not fill this.   Tuesday Patient took Olmesartan 20 mg 1 pill in the morning and 1 pill at night. Woke up Wednesday with BP of 152/88 with sounds in her ear and dizziness. Patient then took Half of the Olmesartan 20 mg (10 mg) and one Clonidine 0.1 mg that was given to her by Dr Juliann Pares.   Patient states she feels better today and BP was 130/79 at 9:00 am.

## 2020-12-25 ENCOUNTER — Telehealth: Payer: Self-pay | Admitting: Gastroenterology

## 2020-12-25 ENCOUNTER — Other Ambulatory Visit: Payer: Self-pay | Admitting: Internal Medicine

## 2020-12-25 DIAGNOSIS — I10 Essential (primary) hypertension: Secondary | ICD-10-CM

## 2020-12-25 MED ORDER — OLMESARTAN MEDOXOMIL 5 MG PO TABS: 15.0000 mg | ORAL_TABLET | Freq: Two times a day (BID) | ORAL | 3 refills | Status: DC

## 2020-12-25 NOTE — Telephone Encounter (Signed)
Left message on voicemail.

## 2020-12-25 NOTE — Telephone Encounter (Signed)
Patient called and is frustrated because she is not feeling better. Still have abd pain,constipation (0-10 @ 5) and rectal bleeding. Set her up for an appt with Dr. Allegra Lai for hemorrhoids. What else can she do?

## 2020-12-25 NOTE — Telephone Encounter (Signed)
Pt wants to discuss further before scheduling procedures... appt scheduled for next week to discuss, also I left original appt scheduled for Aug 17th in the meantime

## 2020-12-25 NOTE — Telephone Encounter (Signed)
Patient informed and verbalized understanding

## 2020-12-25 NOTE — Telephone Encounter (Signed)
Again 1 doctor needs to manage her BP so we all know what is being done  She sees kidney doctor and cardiology and kidney doctor [facility] 11/01/20 had her on benicar/olmesartan 15 mg bid and procardia 60 mg xl. Cardiology also added clonidine 0.1 mg bid but if she misses dose of clonidine this will make her BP spike any ?s review with renal, cardiology please re BP meds and management   So for BP Procardia 60 mg xl daily I know she should be on this  Benicar 15 mg 2x per day per last kidney note 11/01/20  Clonidine per cardiology but note what I mentioned above Goal BP <130/<80

## 2020-12-25 NOTE — Telephone Encounter (Signed)
Decided she doesn't want the appt with Dr. Allegra Lai until she saw Dr. Maximino Greenland

## 2021-01-01 ENCOUNTER — Other Ambulatory Visit: Payer: Self-pay

## 2021-01-01 ENCOUNTER — Ambulatory Visit (INDEPENDENT_AMBULATORY_CARE_PROVIDER_SITE_OTHER): Payer: Medicare Other | Admitting: Gastroenterology

## 2021-01-01 VITALS — BP 134/85 | HR 75 | Wt 159.8 lb

## 2021-01-01 DIAGNOSIS — K625 Hemorrhage of anus and rectum: Secondary | ICD-10-CM

## 2021-01-01 DIAGNOSIS — R109 Unspecified abdominal pain: Secondary | ICD-10-CM

## 2021-01-01 MED ORDER — NA SULFATE-K SULFATE-MG SULF 17.5-3.13-1.6 GM/177ML PO SOLN: 1.0000 | Freq: Once | ORAL | 0 refills | Status: AC

## 2021-01-01 NOTE — Addendum Note (Signed)
Addended by: Roena Malady on: 01/01/2021 05:08 PM   Modules accepted: Orders, SmartSet

## 2021-01-01 NOTE — Progress Notes (Signed)
Natasha Bouillon, MD [address]  [address]  [place], [place] [address]  Main: [phone]  Fax: [phone]   Primary Care Physician: [name], [name], MD   Chief Complaint  Patient presents with   Follow-up    Still having abd pain,constipation and rectal bleeding    HPI: Natasha Phillips is a 70 y.o. female here for follow-up of constipation, abdominal pain and intermittent bright red blood per rectum.  On previous visit EGD and colonoscopy were discussed, but patient did not want to schedule the procedures at that time.  She has been taking MiraLAX and with this constipation has improved and she reports bowel movements are more fuller and softer compared to before.  However, after one of the doses of MiraLAX a couple weeks ago, she did have bright red blood per rectum and so she stopped taking MiraLAX.  She continues to report bilateral upper quadrant abdominal pain, dull, 5/10, nonradiating, with no aggravating or relieving factors.  Is also reporting weight loss associated with this.  No nausea or vomiting.  Reports dysphagia to pills as well.   ROS: All ROS reviewed and negative except as per HPI   Past Medical History:  Diagnosis Date   Anemia    CKD (chronic kidney disease)    Diabetes mellitus without complication (HCC)    Glaucoma    [facility] with macular edema and Dr. Frederico Hamman sees Q3-4 months   Hypertension     Past Surgical History:  Procedure Laterality Date   BIOPSY THYROID     CESAREAN SECTION     1981 1988   TUBAL LIGATION      Prior to Admission medications   Medication Sig Start Date End Date Taking? Authorizing Provider  aspirin EC 81 MG tablet Take 81 mg by mouth daily.   Yes [provider]  cloNIDine (CATAPRES) 0.1 MG tablet Take 1 tablet by mouth 2 (two) times daily as needed. 10/11/20  Yes [provider]  ELDERBERRY PO Take by mouth.   Yes [provider]  famotidine (PEPCID) 20 MG tablet Take 1  tablet (20 mg total) by mouth daily. 07/13/20  Yes Armbruster, Willaim Rayas, MD  Ferrous Sulfate 27 MG TABS Take by mouth.   Yes [provider]  fluticasone (FLONASE) 50 MCG/ACT nasal spray Place 2 sprays into both nostrils daily. prn 10/04/20  Yes [name], [name], MD  furosemide (LASIX) 20 MG tablet Take 1 tablet (20 mg total) by mouth daily as needed for fluid or edema. 07/13/19  Yes Margaretann Loveless, PA-C  hydrocortisone (ANUSOL-HC) 2.5 % rectal cream Place 1 application rectally 3 (three) times daily. 05/17/20  Yes [name], [name], MD  Inulin (FIBER CHOICE FRUITY BITES) 1.5 g CHEW Take as directed 07/13/20  Yes Armbruster, Willaim Rayas, MD  loratadine (CLARITIN) 10 MG tablet Take 10 mg by mouth daily as needed for allergies.   Yes [provider]  magnesium oxide (MAG-OX) 400 MG tablet Take 400 mg by mouth daily.   Yes [provider]  metFORMIN (GLUCOPHAGE) 1000 MG tablet Take 1 tablet (1,000 mg total) by mouth daily. 10/04/20  Yes [name], [name], MD  Multiple Vitamin (MULTIVITAMIN WITH MINERALS) TABS tablet Take 1 tablet by mouth daily.   Yes [provider]  NIFEdipine (PROCARDIA XL/NIFEDICAL XL) 60 MG 24 hr tablet TAKE 1 TABLET(60 MG) BY MOUTH DAILY 10/04/20  Yes [name], [name], MD  olmesartan (BENICAR) 5 MG tablet Take 3 tablets (15  mg total) by mouth in the morning and at bedtime. Per [facility] Dr Austin Miles goal blood pressure <130/<80 12/25/20  Yes [name], [name], MD  Polyethylene Glycol POWD Take 17 g by mouth daily. 10/30/20  Yes [name], MD  Rectal Protectant-Emollient (CALMOL-4) 76-10 % SUPP Take as directed 07/13/20  Yes Armbruster, Willaim Rayas, MD  saxagliptin HCl (ONGLYZA) 2.5 MG TABS tablet Take 1 tablet by mouth daily. 10/02/20  Yes [provider]  triamcinolone cream (KENALOG) 0.1 % Apply 1 application topically 2 (two) times daily. Left lower leg bid prn 08/06/19  Yes [name], [name], MD   ZINC-VITAMIN C PO Take 1 tablet by mouth daily.   Yes [provider]    Family History  Problem Relation Age of Onset   Diabetes Mother    Heart failure Mother    Diabetes Sister    Healthy Sister    Healthy Sister    Healthy Sister    Diabetes Brother    Diabetes Sister    Heart failure Father    Brain cancer Brother    Throat cancer Brother    Glaucoma Other        Runs on Paternal Side   Breast cancer Cousin      Social History   Tobacco Use   Smoking status: Never   Smokeless tobacco: Never  Vaping Use   Vaping Use: Never used  Substance Use Topics   Alcohol use: No   Drug use: No    Allergies as of 01/01/2021 - Review Complete 01/01/2021  Allergen Reaction Noted   Coreg [carvedilol]  09/01/2020   Hctz [hydrochlorothiazide]  07/22/2019   Januvia [sitagliptin] Diarrhea 01/23/2016   Sulfa antibiotics  01/11/2015   Losartan Rash 02/10/2020    Physical Examination:  Constitutional: General:   Alert,  Well-developed, well-nourished, pleasant and cooperative in NAD BP 134/85   Pulse 75   Wt 159 lb 12.8 oz (72.5 kg)   BMI 30.19 kg/m   Respiratory: Normal respiratory effort  Gastrointestinal:  Normal bowel sounds.  No bruits.  Soft, non-tender and non-distended without masses, hepatosplenomegaly or hernias noted.  No guarding or rebound tenderness.     Cardiac: No clubbing or edema.  No cyanosis. Normal posterior tibial pedal pulses noted.  Psych:  Alert and cooperative. Normal mood and affect.  Musculoskeletal:  Normal gait. Head normocephalic, atraumatic. Symmetrical without gross deformities. 5/5 Lower extremity strength bilaterally.  Skin: Warm. Intact without significant lesions or rashes. No jaundice.  Neck: Supple, trachea midline  Lymph: No cervical lymphadenopathy  Psych:  Alert and oriented x3, Alert and cooperative. Normal mood and affect.  Labs: CMP     Component Value Date/Time   NA 137 08/06/2019 1343   NA 141 07/12/2019  0954   K 4.2 08/06/2019 1343   CL 102 08/06/2019 1343   CO2 30 08/06/2019 1343   GLUCOSE 90 08/06/2019 1343   BUN 29 (H) 08/06/2019 1343   BUN 28 (H) 07/12/2019 0954   CREATININE 1.09 08/06/2019 1343   CALCIUM 10.5 08/06/2019 1343   PROT 8.3 08/24/2019 1158   ALBUMIN 4.2 07/12/2019 0954   AST 18 07/12/2019 0954   ALT 14 07/12/2019 0954   ALKPHOS 108 07/12/2019 0954   BILITOT 0.2 07/12/2019 0954   GFRNONAA 55 (L) 07/12/2019 0954   GFRAA 63 07/12/2019 0954   Lab Results  Component Value Date   WBC 5.0 09/07/2020   HGB 11.8 (L) 09/07/2020   HCT 34.4 (L) 09/07/2020  MCV 87.1 09/07/2020   PLT 287.0 09/07/2020    Imaging Studies:   Assessment and Plan:   Natasha Phillips is a 70 y.o. y/o female with intermittent bright red blood per rectum, abdominal pain, dysphagia to pills  Given ongoing symptoms of bright red blood per rectum and abdominal pain despite improvement in constipation, EGD and colonoscopy indicated for further evaluation of symptoms  Patient is also due for screening colonoscopy and this will both evaluate for hemorrhoids, or other underlying lesions causing her bright red blood per rectum  I have discussed alternative options, risks & benefits,  which include, but are not limited to, bleeding, infection, perforation,respiratory complication & drug reaction.  The patient agrees with this plan & written consent will be obtained.    Continue high-fiber diet and MiraLAX daily or every other day to avoid constipation  Obtain biopsies for H. pylori during upper endoscopy given abdominal pain and previous history of IgG positive antibody for H. pylori that was treated in January 2022     Dr Natasha Phillips

## 2021-01-26 ENCOUNTER — Ambulatory Visit
Admission: RE | Admit: 2021-01-26 | Discharge: 2021-01-26 | Disposition: A | Discharge status: Home / Self Care | Payer: Medicare Other | Attending: Gastroenterology | Admitting: Gastroenterology

## 2021-01-26 ENCOUNTER — Ambulatory Visit: Payer: Medicare Other | Admitting: Anesthesiology

## 2021-01-26 ENCOUNTER — Other Ambulatory Visit: Payer: Self-pay

## 2021-01-26 ENCOUNTER — Encounter: Payer: Self-pay | Admitting: Gastroenterology

## 2021-01-26 ENCOUNTER — Encounter
Admission: RE | Disposition: A | Discharge status: Home / Self Care | Payer: Self-pay | Source: Home / Self Care | Attending: Gastroenterology

## 2021-01-26 DIAGNOSIS — K635 Polyp of colon: Secondary | ICD-10-CM

## 2021-01-26 DIAGNOSIS — K449 Diaphragmatic hernia without obstruction or gangrene: Secondary | ICD-10-CM

## 2021-01-26 DIAGNOSIS — K5521 Angiodysplasia of colon with hemorrhage: Secondary | ICD-10-CM | POA: Diagnosis not present

## 2021-01-26 DIAGNOSIS — Z882 Allergy status to sulfonamides status: Secondary | ICD-10-CM | POA: Diagnosis not present

## 2021-01-26 DIAGNOSIS — K625 Hemorrhage of anus and rectum: Secondary | ICD-10-CM

## 2021-01-26 DIAGNOSIS — R109 Unspecified abdominal pain: Secondary | ICD-10-CM

## 2021-01-26 DIAGNOSIS — K5140 Inflammatory polyps of colon without complications: Secondary | ICD-10-CM | POA: Diagnosis not present

## 2021-01-26 DIAGNOSIS — Z1211 Encounter for screening for malignant neoplasm of colon: Secondary | ICD-10-CM | POA: Diagnosis not present

## 2021-01-26 DIAGNOSIS — Z7984 Long term (current) use of oral hypoglycemic drugs: Secondary | ICD-10-CM | POA: Insufficient documentation

## 2021-01-26 DIAGNOSIS — K649 Unspecified hemorrhoids: Secondary | ICD-10-CM | POA: Diagnosis not present

## 2021-01-26 DIAGNOSIS — K648 Other hemorrhoids: Secondary | ICD-10-CM | POA: Diagnosis not present

## 2021-01-26 DIAGNOSIS — R1310 Dysphagia, unspecified: Secondary | ICD-10-CM | POA: Diagnosis not present

## 2021-01-26 DIAGNOSIS — K3189 Other diseases of stomach and duodenum: Secondary | ICD-10-CM | POA: Diagnosis not present

## 2021-01-26 DIAGNOSIS — Z7982 Long term (current) use of aspirin: Secondary | ICD-10-CM | POA: Diagnosis not present

## 2021-01-26 DIAGNOSIS — K2950 Unspecified chronic gastritis without bleeding: Secondary | ICD-10-CM | POA: Insufficient documentation

## 2021-01-26 DIAGNOSIS — E871 Hypo-osmolality and hyponatremia: Secondary | ICD-10-CM | POA: Diagnosis not present

## 2021-01-26 DIAGNOSIS — Z888 Allergy status to other drugs, medicaments and biological substances status: Secondary | ICD-10-CM | POA: Insufficient documentation

## 2021-01-26 DIAGNOSIS — Z79899 Other long term (current) drug therapy: Secondary | ICD-10-CM | POA: Diagnosis not present

## 2021-01-26 DIAGNOSIS — K2940 Chronic atrophic gastritis without bleeding: Secondary | ICD-10-CM

## 2021-01-26 HISTORY — PX: COLONOSCOPY WITH PROPOFOL: SHX5780

## 2021-01-26 HISTORY — PX: ESOPHAGOGASTRODUODENOSCOPY (EGD) WITH PROPOFOL: SHX5813

## 2021-01-26 LAB — GLUCOSE, CAPILLARY: Glucose-Capillary: 117 mg/dL — ABNORMAL HIGH (ref 70–99)

## 2021-01-26 SURGERY — COLONOSCOPY WITH PROPOFOL
Anesthesia: Monitor Anesthesia Care

## 2021-01-26 MED ORDER — PROPOFOL 10 MG/ML IV BOLUS
INTRAVENOUS | Status: DC | PRN
  Administered 2021-01-26: 50 mg via INTRAVENOUS
  Administered 2021-01-26: 20 mg via INTRAVENOUS
  Administered 2021-01-26: 50 mg via INTRAVENOUS
  Administered 2021-01-26: 20 mg via INTRAVENOUS

## 2021-01-26 MED ORDER — HYDROCORTISONE 2.5 % EX CREA: TOPICAL_CREAM | Freq: Two times a day (BID) | CUTANEOUS | 0 refills | Status: DC

## 2021-01-26 MED ORDER — SODIUM CHLORIDE 0.9 % IV SOLN: INTRAVENOUS | Status: DC

## 2021-01-26 MED ORDER — PROPOFOL 500 MG/50ML IV EMUL
INTRAVENOUS | Status: DC | PRN
  Administered 2021-01-26: 125 ug/kg/min via INTRAVENOUS

## 2021-01-26 MED ORDER — HYDROCORTISONE 2.5 % EX CREA: TOPICAL_CREAM | CUTANEOUS | 0 refills | Status: DC

## 2021-01-26 MED ORDER — LIDOCAINE HCL (CARDIAC) PF 100 MG/5ML IV SOSY
PREFILLED_SYRINGE | INTRAVENOUS | Status: DC | PRN
  Administered 2021-01-26: 20 mg via INTRAVENOUS

## 2021-01-26 MED ORDER — LIDOCAINE HCL (PF) 2 % IJ SOLN
INTRAMUSCULAR | Status: AC
  Filled 2021-01-26: qty 5

## 2021-01-26 MED ORDER — PHENYLEPHRINE HCL (PRESSORS) 10 MG/ML IV SOLN
INTRAVENOUS | Status: AC
  Filled 2021-01-26: qty 1

## 2021-01-26 MED ORDER — PROPOFOL 500 MG/50ML IV EMUL
INTRAVENOUS | Status: AC
  Filled 2021-01-26: qty 50

## 2021-01-26 NOTE — Op Note (Signed)
[facility] Gastroenterology Patient Name: Natasha Phillips Procedure Date: 01/26/2021 8:08 AM MRN: [id number] Account #: [id number] Date of Birth: Oct 04, 1950 Admit Type: Outpatient Age: 70 Room: [facility] ENDO ROOM 2 Gender: Female Note Status: Finalized Procedure:             Upper GI endoscopy Indications:           Abdominal pain, Dysphagia Providers:             Demarie Uhlig B. Maximino Greenland MD, MD Referring MD:          [name] [name] MD, MD (Referring MD) Medicines:             Monitored Anesthesia Care Complications:         No immediate complications. Procedure:             Pre-Anesthesia Assessment:                        - The risks and benefits of the procedure and the                         sedation options and risks were discussed with the                         patient. All questions were answered and informed                         consent was obtained.                        - Patient identification and proposed procedure were                         verified prior to the procedure.                        - ASA Grade Assessment: II - A patient with mild                         systemic disease.                        After obtaining informed consent, the endoscope was                         passed under direct vision. Throughout the procedure,                         the patient's blood pressure, pulse, and oxygen                         saturations were monitored continuously. The Endoscope                         was introduced through the mouth, and advanced to the                         second part of duodenum. The upper GI endoscopy was  accomplished with ease. The patient tolerated the                         procedure well. Findings:      There is no endoscopic evidence of stenosis or stricture in the entire       esophagus. Biopsies were obtained from the proximal and distal esophagus       with cold forceps for  histology of suspected eosinophilic esophagitis.      Patchy mild mucosal changes characterized by atrophy and nodularity were       found in the gastric body and in the gastric antrum. Imaging was       performed using white light and narrow band imaging to visualize the       mucosa. Biopsies were obtained in the gastric body, at the incisura and       in the gastric antrum with cold forceps for Helicobacter pylori testing.      A small hiatal hernia was present.      The exam of the stomach was otherwise normal.      The examined duodenum was normal. Impression:            - Atrophic and nodular mucosa in the gastric body and                         antrum.                        - Small hiatal hernia.                        - Normal examined duodenum.                        - Biopsies were obtained in the gastric body, at the                         incisura and in the gastric antrum. Recommendation:        - Await pathology results.                        - Follow an antireflux regimen.                        - Continue present medications.                        - Return to primary care physician as previously                         scheduled.                        - Return to my office as previously scheduled.                        - The findings and recommendations were discussed with                         the patient.                        -  The findings and recommendations were discussed with                         the patient's family. Procedure Code(s):     --- Professional ---                        [phone], Esophagogastroduodenoscopy, flexible,                         transoral; with biopsy, single or multiple Diagnosis Code(s):     --- Professional ---                        K31.89, Other diseases of stomach and duodenum                        K44.9, Diaphragmatic hernia without obstruction or                         gangrene                        R10.9,  Unspecified abdominal pain                        R13.10, Dysphagia, unspecified CPT copyright 2019 American Medical Association. All rights reserved. The codes documented in this report are preliminary and upon coder review may  be revised to meet current compliance requirements.  Melodie Bouillon, MD Michel Bickers B. Maximino Greenland MD, MD 01/26/2021 8:30:19 AM This report has been signed electronically. Number of Addenda: 0 Note Initiated On: 01/26/2021 8:08 AM Estimated Blood Loss:  Estimated blood loss: none.      [facility]

## 2021-01-26 NOTE — H&P (Signed)
Natasha Bouillon, MD [address], [place], [place], [address] [address], [address], [place], [place], [address] Phone: [phone]  Fax: [phone]  Primary Care Physician:  [name], [name], MD   Pre-Procedure History & Physical: HPI:  Natasha Phillips is a 70 y.o. female is here for a colonoscopy and EGD.   Past Medical History:  Diagnosis Date   Anemia    CKD (chronic kidney disease)    Diabetes mellitus without complication (HCC)    Glaucoma    [facility] with macular edema and Dr. Frederico Hamman sees Q3-4 months   Hypertension     Past Surgical History:  Procedure Laterality Date   BIOPSY THYROID     CESAREAN SECTION     1981 1988   TUBAL LIGATION      Prior to Admission medications   Medication Sig Start Date End Date Taking? Authorizing Provider  cloNIDine (CATAPRES) 0.1 MG tablet Take 1 tablet by mouth 2 (two) times daily as needed. 10/11/20  Yes [provider]  magnesium oxide (MAG-OX) 400 MG tablet Take 400 mg by mouth daily.   Yes [provider]  metFORMIN (GLUCOPHAGE) 1000 MG tablet Take 1 tablet (1,000 mg total) by mouth daily. 10/04/20  Yes [name], [name], MD  olmesartan (BENICAR) 5 MG tablet Take 3 tablets (15 mg total) by mouth in the morning and at bedtime. Per [facility] Dr Austin Miles goal blood pressure <130/<80 12/25/20  Yes [name], [name], MD  aspirin EC 81 MG tablet Take 81 mg by mouth daily.    [provider]  ELDERBERRY PO Take by mouth.    [provider]  famotidine (PEPCID) 20 MG tablet Take 1 tablet (20 mg total) by mouth daily. Patient not taking: Reported on 01/26/2021 07/13/20   Benancio Deeds, MD  Ferrous Sulfate 27 MG TABS Take by mouth.    [provider]  fluticasone (FLONASE) 50 MCG/ACT nasal spray Place 2 sprays into both nostrils daily. prn 10/04/20   [name], [name], MD  furosemide (LASIX) 20 MG tablet Take 1 tablet (20 mg total) by mouth daily as  needed for fluid or edema. 07/13/19   Margaretann Loveless, PA-C  hydrocortisone (ANUSOL-HC) 2.5 % rectal cream Place 1 application rectally 3 (three) times daily. 05/17/20   [name], [name], MD  Inulin (FIBER CHOICE FRUITY BITES) 1.5 g CHEW Take as directed 07/13/20   Armbruster, Willaim Rayas, MD  loratadine (CLARITIN) 10 MG tablet Take 10 mg by mouth daily as needed for allergies.    [provider]  Multiple Vitamin (MULTIVITAMIN WITH MINERALS) TABS tablet Take 1 tablet by mouth daily.    [provider]  NIFEdipine (PROCARDIA XL/NIFEDICAL XL) 60 MG 24 hr tablet TAKE 1 TABLET(60 MG) BY MOUTH DAILY Patient not taking: Reported on 01/26/2021 10/04/20   [name], [name], MD  Polyethylene Glycol POWD Take 17 g by mouth daily. 10/30/20   [name], MD  Rectal Protectant-Emollient (CALMOL-4) 76-10 % SUPP Take as directed 07/13/20   Armbruster, Willaim Rayas, MD  saxagliptin HCl (ONGLYZA) 2.5 MG TABS tablet Take 1 tablet by mouth daily. Patient not taking: Reported on 01/26/2021 10/02/20   [provider]  triamcinolone cream (KENALOG) 0.1 % Apply 1 application topically 2 (two) times daily. Left lower leg bid prn 08/06/19   [name], [name], MD  ZINC-VITAMIN C PO Take 1 tablet by mouth daily.    [provider]    Allergies as of  01/02/2021 - Review Complete 01/01/2021  Allergen Reaction Noted   Coreg [carvedilol]  09/01/2020   Hctz [hydrochlorothiazide]  07/22/2019   Januvia [sitagliptin] Diarrhea 01/23/2016   Sulfa antibiotics  01/11/2015   Losartan Rash 02/10/2020    Family History  Problem Relation Age of Onset   Diabetes Mother    Heart failure Mother    Diabetes Sister    Healthy Sister    Healthy Sister    Healthy Sister    Diabetes Brother    Diabetes Sister    Heart failure Father    Brain cancer Brother    Throat cancer Brother    Glaucoma Other        Runs on Paternal Side   Breast cancer Cousin     Social History    Socioeconomic History   Marital status: Widowed    Spouse name: Not on file   Number of children: 2   Years of education: College   Highest education level: Not on file  Occupational History   Occupation: Retired 2014    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  Tobacco Use   Smoking status: Never   Smokeless tobacco: Never  Vaping Use   Vaping Use: Never used  Substance and Sexual Activity   Alcohol use: No   Drug use: No   Sexual activity: Not Currently    Birth control/protection: None  Other Topics Concern   Not on file  Social History Narrative   Lives at home    Social Determinants of Health   Financial Resource Strain: Low Risk    Difficulty of Paying Living Expenses: Not very hard  Food Insecurity: No Food Insecurity   Worried About Programme researcher, broadcasting/film/video in the Last Year: Never true   Ran Out of Food in the Last Year: Never true  Transportation Needs: Not on file  Physical Activity: Sufficiently Active   Days of Exercise per Week: 7 days   Minutes of Exercise per Session: 30 min  Stress: Not on file  Social Connections: Not on file  Intimate Partner Violence: Not on file    Review of Systems: See HPI, otherwise negative ROS  Constitutional: General:   Alert,  Well-developed, well-nourished, pleasant and cooperative in NAD BP (!) 169/90   Pulse 92   Temp (!) 96.9 F (36.1 C) (Temporal)   Resp 20   Ht 5\' 1"  (1.549 m)   Wt 72.1 kg   SpO2 100%   BMI 30.04 kg/m   Head: Normocephalic, atraumatic.   Eyes:  Sclera clear, no icterus.   Conjunctiva pink.   Mouth:  No deformity or lesions, oropharynx pink & moist.  Neck:  Supple, trachea midline  Respiratory: Normal respiratory effort  Gastrointestinal:  Soft, non-tender and non-distended without masses, hepatosplenomegaly or hernias noted.  No guarding or rebound tenderness.     Cardiac: No clubbing or edema.  No cyanosis. Normal posterior tibial pedal pulses noted.  Lymphatic:  No significant  cervical adenopathy.  Psych:  Alert and cooperative. Normal mood and affect.  Musculoskeletal:   Symmetrical without gross deformities. 5/5 Lower extremity strength bilaterally.  Skin: Warm. Intact without significant lesions or rashes. No jaundice.  Neurologic:  Face symmetrical, tongue midline, Normal sensation to touch;  grossly normal neurologically.  Psych:  Alert and oriented x3, Alert and cooperative. Normal mood and affect.  Impression/Plan: Natasha Phillips is here for a colonoscopy to be performed for average risk screening and EGD for dysphagia, abdominal pain.  Risks, benefits, limitations,  and alternatives regarding the procedures have been reviewed with the patient.  Questions have been answered.  All parties agreeable.   [name], MD  01/26/2021, 8:09 AM

## 2021-01-26 NOTE — Op Note (Signed)
[facility] Gastroenterology Patient Name: Blakley Michna Procedure Date: 01/26/2021 8:07 AM MRN: [id number] Account #: [id number] Date of Birth: 1950/11/08 Admit Type: Outpatient Age: 70 Room: [facility] ENDO ROOM 2 Gender: Female Note Status: Finalized Procedure:             Colonoscopy Indications:           Screening for colorectal malignant neoplasm Providers:             Lechelle Wrigley B. Maximino Greenland MD, MD Referring MD:          [name] [name] MD, MD (Referring MD) Medicines:             Monitored Anesthesia Care Complications:         No immediate complications. Procedure:             Pre-Anesthesia Assessment:                        - ASA Grade Assessment: II - A patient with mild                         systemic disease.                        - Prior to the procedure, a History and Physical was                         performed, and patient medications, allergies and                         sensitivities were reviewed. The patient's tolerance                         of previous anesthesia was reviewed.                        - The risks and benefits of the procedure and the                         sedation options and risks were discussed with the                         patient. All questions were answered and informed                         consent was obtained.                        - Patient identification and proposed procedure were                         verified prior to the procedure by the physician, the                         nurse, the anesthesiologist, the anesthetist and the                         technician. The procedure was verified in the  procedure room.                        After obtaining informed consent, the colonoscope was                         passed under direct vision. Throughout the procedure,                         the patient's blood pressure, pulse, and oxygen                         saturations  were monitored continuously. The                         Colonoscope was introduced through the anus and                         advanced to the the terminal ileum. The colonoscopy                         was performed with ease. The patient tolerated the                         procedure well. The quality of the bowel preparation                         was fair. Findings:      The perianal and digital rectal examinations were normal.      A 6 mm polyp was found in the sigmoid colon. The polyp was sessile.       Imaging was performed using white light and narrow band imaging to       visualize the mucosa. The polyp was removed with a cold snare. Resection       and retrieval were complete.      A single small localized angioectasia without bleeding was found in the       cecum. This is an incidental finding. March 2022 labs show normal       ferritin and iron panel and thus treatment is not indicated at this time.      The exam was otherwise without abnormality.      The rectum, sigmoid colon, descending colon, transverse colon, ascending       colon and cecum appeared normal.      Non-bleeding internal hemorrhoids were found during retroflexion. Impression:            - Preparation of the colon was fair.                        - Source of intermittent BRBPR is the patient's                         internal hemorrhoids                        - One 6 mm polyp in the sigmoid colon, removed with a                         cold snare. Resected and retrieved.                        -  A single non-bleeding colonic angioectasia.                        - The examination was otherwise normal.                        - The rectum, sigmoid colon, descending colon,                         transverse colon, ascending colon and cecum are normal.                        - Non-bleeding internal hemorrhoids. Recommendation:        - Discharge patient to home (with escort).                        - Use  hydrocortisone suppository 25 mg 1 per rectum                         once a day for 7 days.                        - Advance diet as tolerated.                        - Continue present medications.                        - Await pathology results.                        - Repeat colonoscopy in 6-12 months with 2 day prep                         because the bowel preparation was suboptimal.                        - The findings and recommendations were discussed with                         the patient.                        - The findings and recommendations were discussed with                         the patient's family.                        - Return to primary care physician as previously                         scheduled.                        - High fiber diet. Procedure Code(s):     --- Professional ---                        [id number], Colonoscopy, flexible; with removal of  tumor(s), polyp(s), or other lesion(s) by snare                         technique Diagnosis Code(s):     --- Professional ---                        Z12.11, Encounter for screening for malignant neoplasm                         of colon                        K63.5, Polyp of colon CPT copyright 2019 American Medical Association. All rights reserved. The codes documented in this report are preliminary and upon coder review may  be revised to meet current compliance requirements.  Melodie Bouillon, MD Michel Bickers B. Maximino Greenland MD, MD 01/26/2021 9:19:39 AM This report has been signed electronically. Number of Addenda: 0 Note Initiated On: 01/26/2021 8:07 AM Scope Withdrawal Time: 0 hours 20 minutes 7 seconds  Total Procedure Duration: 0 hours 29 minutes 18 seconds  Estimated Blood Loss:  Estimated blood loss: none.      [facility]

## 2021-01-26 NOTE — Anesthesia Preprocedure Evaluation (Addendum)
Anesthesia Evaluation  Patient identified by MRN, date of birth, ID band Patient awake    Reviewed: Allergy & Precautions, NPO status , Patient's Chart, lab work & pertinent test results  History of Anesthesia Complications Negative for: history of anesthetic complications  Airway Mallampati: I  TM Distance: <3 FB Neck ROM: Full    Dental no notable dental hx. (+) Teeth Intact   Pulmonary neg pulmonary ROS,    Pulmonary exam normal breath sounds clear to auscultation       Cardiovascular Exercise Tolerance: Good METS: 3 - Mets hypertension, negative cardio ROS Normal cardiovascular exam Rhythm:Regular Rate:Normal     Neuro/Psych negative neurological ROS  negative psych ROS   GI/Hepatic Neg liver ROS, GERD  ,  Endo/Other  negative endocrine ROSdiabetes  Renal/GU Renal disease  negative genitourinary   Musculoskeletal  (+) Arthritis ,   Abdominal Normal abdominal exam  (+)   Peds  Hematology negative hematology ROS (+) anemia ,   Anesthesia Other Findings   Reproductive/Obstetrics negative OB ROS                             Anesthesia Physical Anesthesia Plan  ASA: 3  Anesthesia Plan: MAC   Post-op Pain Management:    Induction: Intravenous  PONV Risk Score and Plan: Ondansetron  Airway Management Planned:   Additional Equipment:   Intra-op Plan:   Post-operative Plan: Extubation in OR  Informed Consent: I have reviewed the patients History and Physical, chart, labs and discussed the procedure including the risks, benefits and alternatives for the proposed anesthesia with the patient or authorized representative who has indicated his/her understanding and acceptance.     Dental advisory given  Plan Discussed with: CRNA  Anesthesia Plan Comments: (Benefits and risk discussed with patient to include death, MI and CVA.  Pt wishes to proceed.)        Anesthesia  Quick Evaluation

## 2021-01-26 NOTE — Transfer of Care (Signed)
Immediate Anesthesia Transfer of Care Note  Patient: Natasha Phillips  Procedure(s) Performed: COLONOSCOPY WITH PROPOFOL ESOPHAGOGASTRODUODENOSCOPY (EGD) WITH PROPOFOL  Patient Location: PACU and Endoscopy Unit  Anesthesia Type:General  Level of Consciousness: awake  Airway & Oxygen Therapy: Patient Spontanous Breathing  Post-op Assessment: Report given to RN  Post vital signs: stable  Last Vitals:  Vitals Value Taken Time  BP 118/67 01/26/21 0906  Temp 36.5 C 01/26/21 0905  Pulse 87 01/26/21 0905  Resp 17 01/26/21 0906  SpO2 98 % 01/26/21 0905  Vitals shown include unvalidated device data.  Last Pain:  Vitals:   01/26/21 0905  TempSrc: Temporal  PainSc: Asleep         Complications: No notable events documented.

## 2021-01-27 NOTE — Anesthesia Postprocedure Evaluation (Signed)
Anesthesia Post Note  Patient: Natasha Phillips  Procedure(s) Performed: COLONOSCOPY WITH PROPOFOL ESOPHAGOGASTRODUODENOSCOPY (EGD) WITH PROPOFOL  Patient location during evaluation: PACU Anesthesia Type: MAC Level of consciousness: awake and alert Pain management: pain level controlled Vital Signs Assessment: post-procedure vital signs reviewed and stable Respiratory status: spontaneous breathing, nonlabored ventilation, respiratory function stable and patient connected to nasal cannula oxygen Cardiovascular status: stable and blood pressure returned to baseline Postop Assessment: no apparent nausea or vomiting Anesthetic complications: no   No notable events documented.   Last Vitals:  Vitals:   01/26/21 0905 01/26/21 0915  BP: 118/67   Pulse: 87 82  Resp: 13   Temp: 36.5 C   SpO2: 98% 100%    Last Pain:  Vitals:   01/26/21 0935  TempSrc:   PainSc: 0-No pain                 Tonny Bollman

## 2021-01-29 ENCOUNTER — Encounter: Payer: Self-pay | Admitting: Gastroenterology

## 2021-01-30 LAB — SURGICAL PATHOLOGY

## 2021-02-07 ENCOUNTER — Telehealth: Payer: Self-pay | Admitting: Gastroenterology

## 2021-02-07 NOTE — Telephone Encounter (Signed)
Pt has scheduled a f/u to discuss concerns

## 2021-02-07 NOTE — Telephone Encounter (Signed)
hydrocortisone 2.5 % cream 30 g 0 01/26/2021    Sig: Insert rectally for internal hemorrhoids. This is being prescribed instead of suppository as this is under formulary for pt and the suppository is not   Sent to pharmacy as: hydrocortisone 2.5 % cream    Patient does not want the cream but the suppository  [facility] in [place]    Patient wants a call as she is still having problems and wants her pathology results.

## 2021-02-12 ENCOUNTER — Other Ambulatory Visit: Payer: Self-pay

## 2021-02-12 ENCOUNTER — Other Ambulatory Visit: Payer: Self-pay | Admitting: Gastroenterology

## 2021-02-12 ENCOUNTER — Ambulatory Visit (INDEPENDENT_AMBULATORY_CARE_PROVIDER_SITE_OTHER): Payer: Medicare Other | Admitting: Gastroenterology

## 2021-02-12 VITALS — BP 189/95 | HR 76 | Temp 97.7°F | Ht 61.0 in | Wt 158.0 lb

## 2021-02-12 DIAGNOSIS — R6889 Other general symptoms and signs: Secondary | ICD-10-CM

## 2021-02-12 DIAGNOSIS — K2970 Gastritis, unspecified, without bleeding: Secondary | ICD-10-CM

## 2021-02-12 DIAGNOSIS — D649 Anemia, unspecified: Secondary | ICD-10-CM | POA: Diagnosis not present

## 2021-02-12 DIAGNOSIS — K219 Gastro-esophageal reflux disease without esophagitis: Secondary | ICD-10-CM

## 2021-02-12 MED ORDER — FAMOTIDINE 20 MG PO TABS: 20.0000 mg | ORAL_TABLET | Freq: Every day | ORAL | 1 refills | Status: DC

## 2021-02-12 NOTE — Progress Notes (Signed)
Melodie Bouillon, MD [address]  [address]  [place], [place] [address]  Main: [phone]  Fax: [phone]   Primary Care Physician: [name], [name], MD   Chief complaint: Abdominal discomfort, heartburn  HPI: Natasha Phillips is a 70 y.o. female here for follow-up of abdominal discomfort and heartburn.  Patient underwent EGD and colonoscopy with findings of intestinal metaplasia on biopsies.  Reporting heartburn and abdominal discomfort about 3-4 times a week.  Is not using anything for it.  No dysphagia.  Reports good appetite.  No nausea or vomiting.  No blood in stool.  Is having 1 soft bowel movement a day.   ROS: All ROS reviewed and negative except as per HPI   Past Medical History:  Diagnosis Date   Anemia    CKD (chronic kidney disease)    Diabetes mellitus without complication (HCC)    Glaucoma    [facility] with macular edema and Dr. Frederico Hamman sees Q3-4 months   Hypertension     Past Surgical History:  Procedure Laterality Date   BIOPSY THYROID     CESAREAN SECTION     1981 1988   COLONOSCOPY WITH PROPOFOL N/A 01/26/2021   Procedure: COLONOSCOPY WITH PROPOFOL;  Surgeon: [name], MD;  Location: [facility] ENDOSCOPY;  Service: Endoscopy;  Laterality: N/A;   ESOPHAGOGASTRODUODENOSCOPY (EGD) WITH PROPOFOL N/A 01/26/2021   Procedure: ESOPHAGOGASTRODUODENOSCOPY (EGD) WITH PROPOFOL;  Surgeon: [name], MD;  Location: [facility] ENDOSCOPY;  Service: Endoscopy;  Laterality: N/A;   TUBAL LIGATION      Prior to Admission medications   Medication Sig Start Date End Date Taking? Authorizing Provider  aspirin EC 81 MG tablet Take 81 mg by mouth daily.   Yes [provider]  cloNIDine (CATAPRES) 0.1 MG tablet Take 1 tablet by mouth 2 (two) times daily as needed. 10/11/20  Yes [provider]  ELDERBERRY PO Take by mouth.   Yes [provider]  famotidine (PEPCID) 20 MG tablet Take 1 tablet (20 mg total) by mouth  daily. 07/13/20  Yes Armbruster, Willaim Rayas, MD  famotidine (PEPCID) 20 MG tablet Take 1 tablet (20 mg total) by mouth daily. 02/12/21  Yes Melodie Bouillon B, MD  Ferrous Sulfate 27 MG TABS Take by mouth.   Yes [provider]  fluticasone (FLONASE) 50 MCG/ACT nasal spray Place 2 sprays into both nostrils daily. prn 10/04/20  Yes [name], [name], MD  furosemide (LASIX) 20 MG tablet Take 1 tablet (20 mg total) by mouth daily as needed for fluid or edema. 07/13/19  Yes Margaretann Loveless, PA-C  hydrocortisone (ANUSOL-HC) 2.5 % rectal cream Place 1 application rectally 3 (three) times daily. 05/17/20  Yes [name], [name], MD  hydrocortisone 2.5 % cream Insert rectally for internal hemorrhoids. This is being prescribed instead of suppository as this is under formulary for pt and the suppository is not 01/26/21  Yes Stevie Charter, Michel Bickers B, MD  Inulin (FIBER CHOICE FRUITY BITES) 1.5 g CHEW Take as directed 07/13/20  Yes Armbruster, Willaim Rayas, MD  loratadine (CLARITIN) 10 MG tablet Take 10 mg by mouth daily as needed for allergies.   Yes [provider]  magnesium oxide (MAG-OX) 400 MG tablet Take 400 mg by mouth daily.   Yes [provider]  metFORMIN (GLUCOPHAGE) 1000 MG tablet Take 1 tablet (1,000 mg total) by mouth daily. 10/04/20  Yes [name], [name], MD  Multiple Vitamin (MULTIVITAMIN WITH MINERALS) TABS tablet Take 1 tablet by mouth daily.  Yes [provider]  NIFEdipine (PROCARDIA XL/NIFEDICAL XL) 60 MG 24 hr tablet TAKE 1 TABLET(60 MG) BY MOUTH DAILY 10/04/20  Yes [name], [name], MD  olmesartan (BENICAR) 5 MG tablet Take 3 tablets (15 mg total) by mouth in the morning and at bedtime. Per [facility] Dr Austin Miles goal blood pressure <130/<80 12/25/20  Yes [name], [name], MD  Polyethylene Glycol POWD Take 17 g by mouth daily. 10/30/20  Yes [name], MD  Rectal Protectant-Emollient (CALMOL-4) 76-10 % SUPP Take as directed  07/13/20  Yes Armbruster, Willaim Rayas, MD  saxagliptin HCl (ONGLYZA) 2.5 MG TABS tablet Take 1 tablet by mouth daily. 10/02/20  Yes [provider]  triamcinolone cream (KENALOG) 0.1 % Apply 1 application topically 2 (two) times daily. Left lower leg bid prn 08/06/19  Yes [name], [name], MD  ZINC-VITAMIN C PO Take 1 tablet by mouth daily.   Yes [provider]    Family History  Problem Relation Age of Onset   Diabetes Mother    Heart failure Mother    Diabetes Sister    Healthy Sister    Healthy Sister    Healthy Sister    Diabetes Brother    Diabetes Sister    Heart failure Father    Brain cancer Brother    Throat cancer Brother    Glaucoma Other        Runs on Paternal Side   Breast cancer Cousin      Social History   Tobacco Use   Smoking status: Never   Smokeless tobacco: Never  Vaping Use   Vaping Use: Never used  Substance Use Topics   Alcohol use: No   Drug use: No    Allergies as of 02/12/2021 - Review Complete 02/12/2021  Allergen Reaction Noted   Coreg [carvedilol]  09/01/2020   Hctz [hydrochlorothiazide]  07/22/2019   Januvia [sitagliptin] Diarrhea 01/23/2016   Sulfa antibiotics  01/11/2015   Losartan Rash 02/10/2020    Physical Examination:  Constitutional: General:   Alert,  Well-developed, well-nourished, pleasant and cooperative in NAD BP (!) 189/95   Pulse 76   Temp 97.7 F (36.5 C)   Ht 5\' 1"  (1.549 m)   Wt 158 lb (71.7 kg)   BMI 29.85 kg/m   Respiratory: Normal respiratory effort  Gastrointestinal:  Soft, non-tender and non-distended without masses, hepatosplenomegaly or hernias noted.  No guarding or rebound tenderness.     Cardiac: No clubbing or edema.  No cyanosis. Normal posterior tibial pedal pulses noted.  Psych:  Alert and cooperative. Normal mood and affect.  Musculoskeletal:  Normal gait. Head normocephalic, atraumatic. Symmetrical without gross deformities. 5/5 Lower extremity strength  bilaterally.  Skin: Warm. Intact without significant lesions or rashes. No jaundice.  Neck: Supple, trachea midline  Lymph: No cervical lymphadenopathy  Psych:  Alert and oriented x3, Alert and cooperative. Normal mood and affect.  Labs: CMP     Component Value Date/Time   NA 137 08/06/2019 1343   NA 141 07/12/2019 0954   K 4.2 08/06/2019 1343   CL 102 08/06/2019 1343   CO2 30 08/06/2019 1343   GLUCOSE 90 08/06/2019 1343   BUN 29 (H) 08/06/2019 1343   BUN 28 (H) 07/12/2019 0954   CREATININE 1.09 08/06/2019 1343   CALCIUM 10.5 08/06/2019 1343   PROT 8.3 08/24/2019 1158   ALBUMIN 4.2 07/12/2019 0954   AST 18 07/12/2019 0954   ALT 14 07/12/2019 0954   ALKPHOS 108 07/12/2019 0954  BILITOT 0.2 07/12/2019 0954   GFRNONAA 55 (L) 07/12/2019 0954   GFRAA 63 07/12/2019 0954   Lab Results  Component Value Date   WBC 5.0 09/07/2020   HGB 11.8 (L) 09/07/2020   HCT 34.4 (L) 09/07/2020   MCV 87.1 09/07/2020   PLT 287.0 09/07/2020   Vitamin B12, 760 February 2021 Iron 91, ferritin 36  Imaging Studies:   Assessment and Plan:   DAJE STARK is a 70 y.o. y/o female here for follow-up of abdominal pain and heartburn  Symptoms may be related to reflux, however her biopsies are showing chronic gastritis with focal atrophy and focal intestinal metaplasia.  We will start Pepcid once a day and reevaluate symptoms  Patient will need further blood work for intestinal metaplasia seen on biopsies, will order parietal cell antibody, B12, intrinsic factor antibody,gastrin level.  Avoid NSAIDs except baby aspirin that she states her cardiologist has recommended that she take  Patient will need gastric mapping biopsies in the near future and this was discussed with her as well  Patient has normocytic anemia with normal ferritin and iron and TIBC.  B12 level was normal in 2021 in 2020  H. pylori IgG was previously positive in January 2022 and was treated by PCP H. pylori stool  antigen was negative after that and pathology did not show any evidence of H. pylori either   Dr Melodie Bouillon

## 2021-02-13 ENCOUNTER — Telehealth: Payer: Self-pay | Admitting: Internal Medicine

## 2021-02-13 NOTE — Telephone Encounter (Signed)
Patient is calling to request a refill on her  metFORMIN (GLUCOPHAGE) 1000 MG tablet. She would like for it to be sent to the [facility] in [place] on [address]

## 2021-02-13 NOTE — Telephone Encounter (Signed)
Returned the call to [place]. Shavonn states that she called the pharmacy and was told that the medication had to be authorized by the provider. Called [facility] in [place].  I was informed by the pharmacist that they have the medication ready for patient pick up. Authorization was not needed.  Called and spoke with [place]. Kaylamarie assured me that she had called and was told that they needed a call from [place] to fill the medication. Pt was informed that the medication is ready for pick up. Pt verbalized understanding and had no further questions.

## 2021-02-13 NOTE — Telephone Encounter (Signed)
Called and spoke with Natasha Phillips about her medication. Pt was informed that she had 4 refills given to her during her last office visit with Dr. French Ana. Patient was understanding and states that on her last refill she received a note that "No more refills. Authorization was required". Pt will call her pharmacy to see if prior authorization is needed and if they have any refills for her medication.

## 2021-02-13 NOTE — Telephone Encounter (Signed)
Patient called back and has questions about authorizing her metformin.

## 2021-02-15 LAB — INTRINSIC FACTOR ANTIBODIES: Intrinsic Factor Abs, Serum: 1.1 AU/mL (ref 0.0–1.1)

## 2021-02-15 LAB — VITAMIN B12: Vitamin B-12: 861 pg/mL (ref 232–1245)

## 2021-02-15 LAB — GASTRIN: Gastrin: 42 pg/mL (ref 0–115)

## 2021-02-15 LAB — ANTI-PARIETAL ANTIBODY: Parietal Cell Ab: 1.5 Units (ref 0.0–20.0)

## 2021-02-20 ENCOUNTER — Telehealth: Payer: Self-pay | Admitting: Gastroenterology

## 2021-02-20 NOTE — Telephone Encounter (Signed)
There are no results to give at the moment, will call when they are received

## 2021-02-20 NOTE — Telephone Encounter (Signed)
Pt. Calling for lab results  °

## 2021-02-21 ENCOUNTER — Ambulatory Visit: Payer: Medicare Other | Admitting: Gastroenterology

## 2021-02-23 ENCOUNTER — Telehealth: Payer: Self-pay | Admitting: Gastroenterology

## 2021-02-23 NOTE — Telephone Encounter (Signed)
Pt is aware as instructed and expressed understanding 

## 2021-02-23 NOTE — Telephone Encounter (Signed)
LVM and calling for lab results.

## 2021-03-08 ENCOUNTER — Ambulatory Visit: Payer: Medicare Other | Admitting: Gastroenterology

## 2021-03-19 DIAGNOSIS — I10 Essential (primary) hypertension: Secondary | ICD-10-CM | POA: Diagnosis not present

## 2021-03-19 DIAGNOSIS — N181 Chronic kidney disease, stage 1: Secondary | ICD-10-CM | POA: Diagnosis not present

## 2021-03-28 ENCOUNTER — Ambulatory Visit: Payer: Medicare Other | Admitting: Gastroenterology

## 2021-04-03 DIAGNOSIS — H2513 Age-related nuclear cataract, bilateral: Secondary | ICD-10-CM | POA: Diagnosis not present

## 2021-04-03 DIAGNOSIS — H40053 Ocular hypertension, bilateral: Secondary | ICD-10-CM | POA: Diagnosis not present

## 2021-04-03 DIAGNOSIS — H524 Presbyopia: Secondary | ICD-10-CM | POA: Diagnosis not present

## 2021-04-03 DIAGNOSIS — E113413 Type 2 diabetes mellitus with severe nonproliferative diabetic retinopathy with macular edema, bilateral: Secondary | ICD-10-CM | POA: Diagnosis not present

## 2021-04-03 DIAGNOSIS — H5203 Hypermetropia, bilateral: Secondary | ICD-10-CM | POA: Diagnosis not present

## 2021-04-03 DIAGNOSIS — H52223 Regular astigmatism, bilateral: Secondary | ICD-10-CM | POA: Diagnosis not present

## 2021-04-04 ENCOUNTER — Ambulatory Visit: Payer: Medicare Other | Admitting: Gastroenterology

## 2021-04-09 DIAGNOSIS — N189 Chronic kidney disease, unspecified: Secondary | ICD-10-CM | POA: Diagnosis not present

## 2021-04-09 DIAGNOSIS — I10 Essential (primary) hypertension: Secondary | ICD-10-CM | POA: Diagnosis not present

## 2021-04-19 DIAGNOSIS — I10 Essential (primary) hypertension: Secondary | ICD-10-CM | POA: Diagnosis not present

## 2021-04-19 DIAGNOSIS — N189 Chronic kidney disease, unspecified: Secondary | ICD-10-CM | POA: Diagnosis not present

## 2021-04-26 DIAGNOSIS — I10 Essential (primary) hypertension: Secondary | ICD-10-CM | POA: Diagnosis not present

## 2021-04-26 DIAGNOSIS — N181 Chronic kidney disease, stage 1: Secondary | ICD-10-CM | POA: Diagnosis not present

## 2021-04-26 DIAGNOSIS — E119 Type 2 diabetes mellitus without complications: Secondary | ICD-10-CM | POA: Diagnosis not present

## 2021-05-06 IMAGING — US US RENAL ARTERY STENOSIS
1 series · 13 of 25 positions shown · non-contrast
Comparison: None.

CLINICAL DATA: Essential hypertension, hyponatremia, chronic kidney
disease stage 2

EXAM:
RENAL DUPLEX DOPPLER ULTRASOUND
TECHNIQUE: Duplex and color Doppler ultrasound was utilized to evaluate blood
flow in the renal arteries and kidneys.

[Series 1: us renal artery stenosis · 13 of 72 slices shown]
[im 1/72]
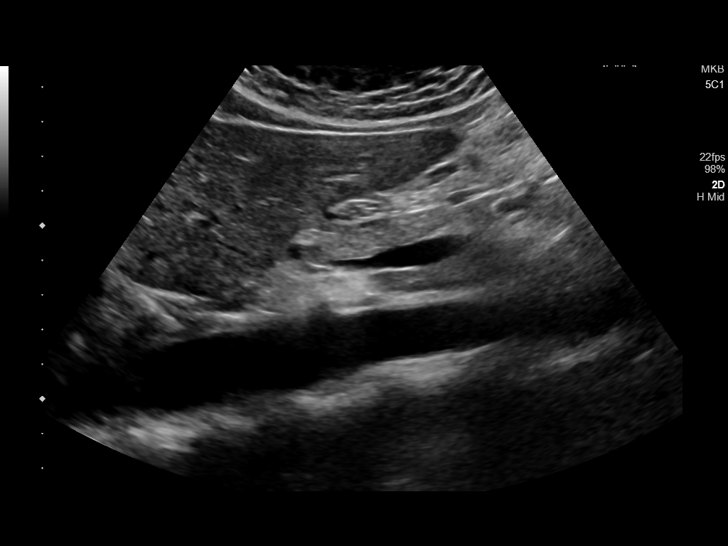
[im 6/72]
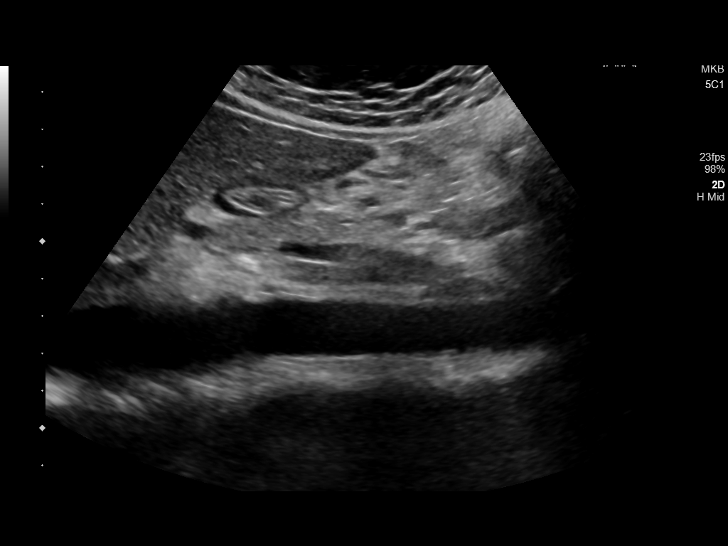
[im 12/72]
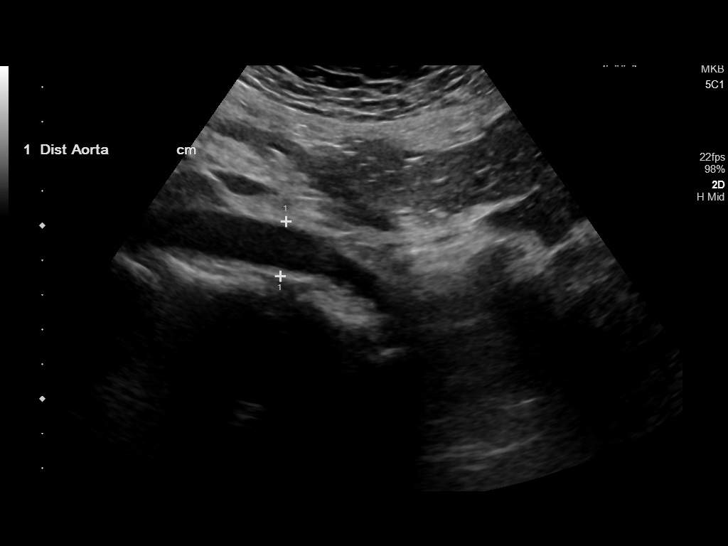
[im 18/72]
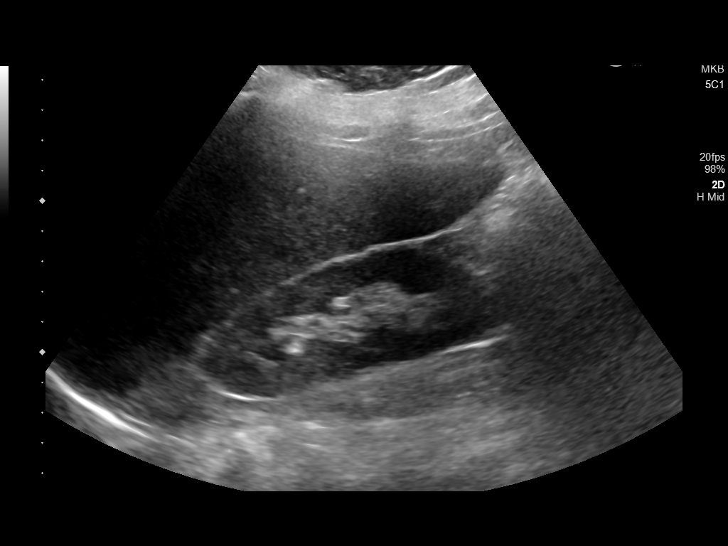
[im 24/72]
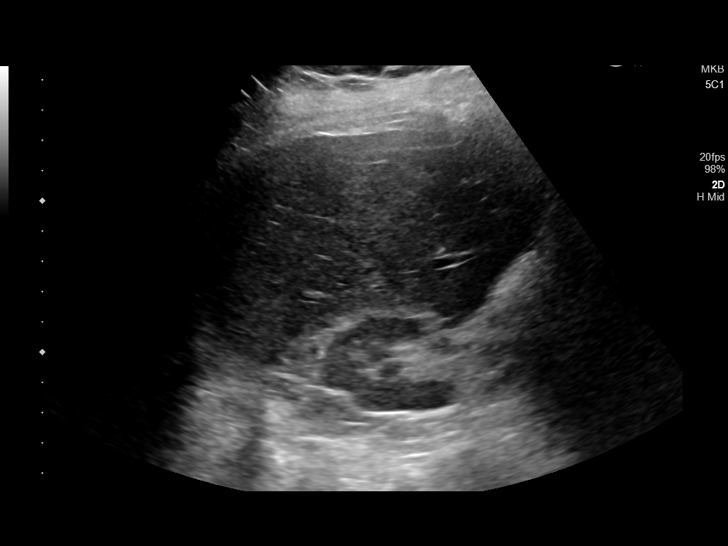
[im 30/72]
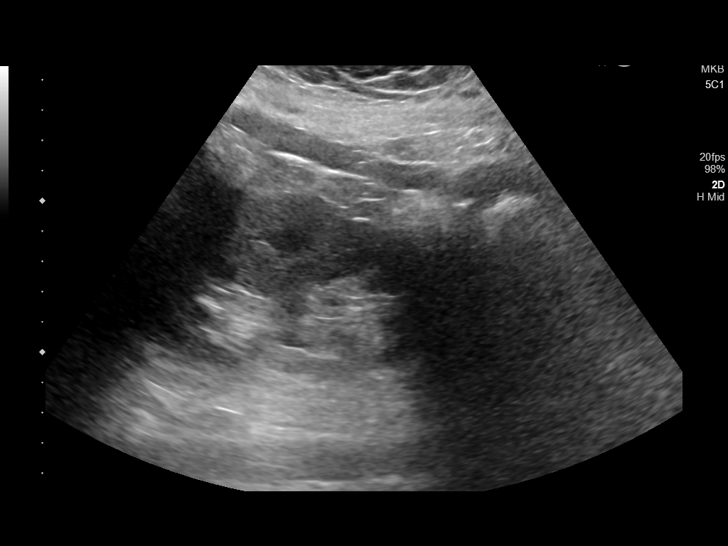
[im 36/72]
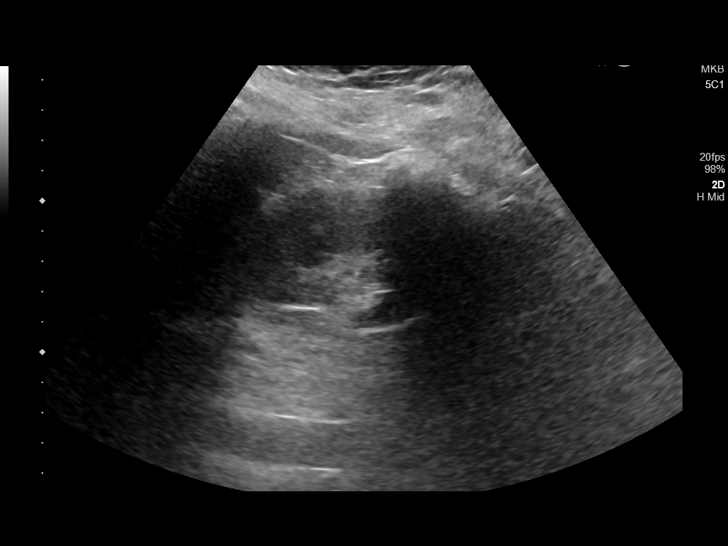
[im 42/72]
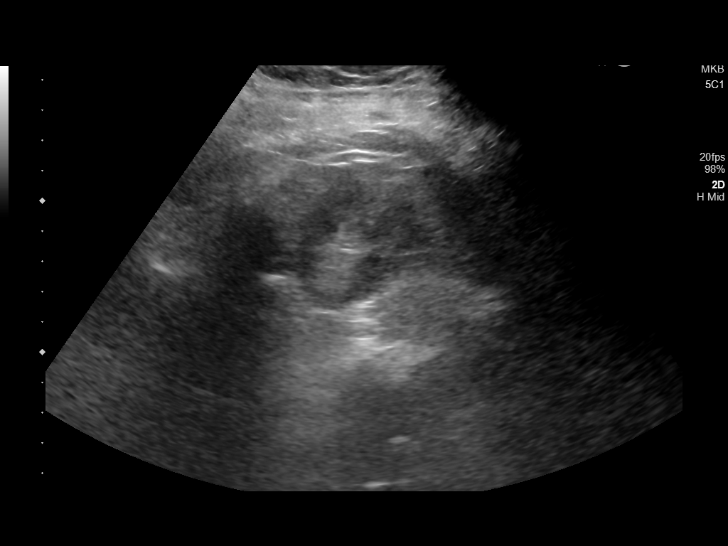
[im 48/72]
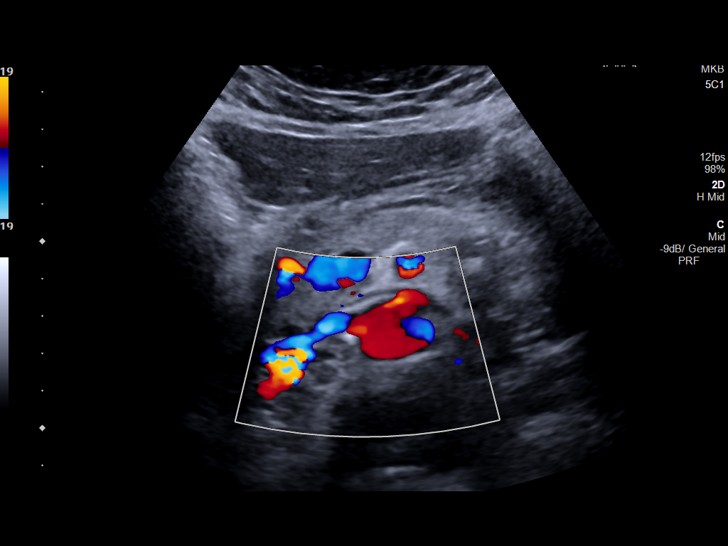
[im 54/72]
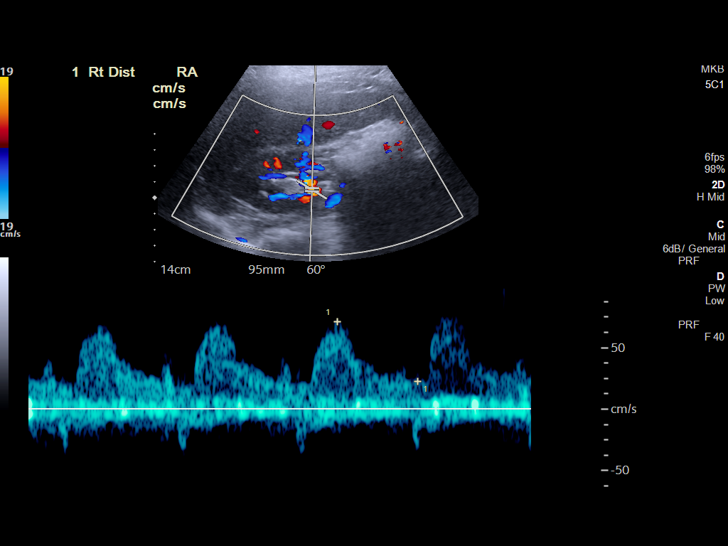
[im 60/72]
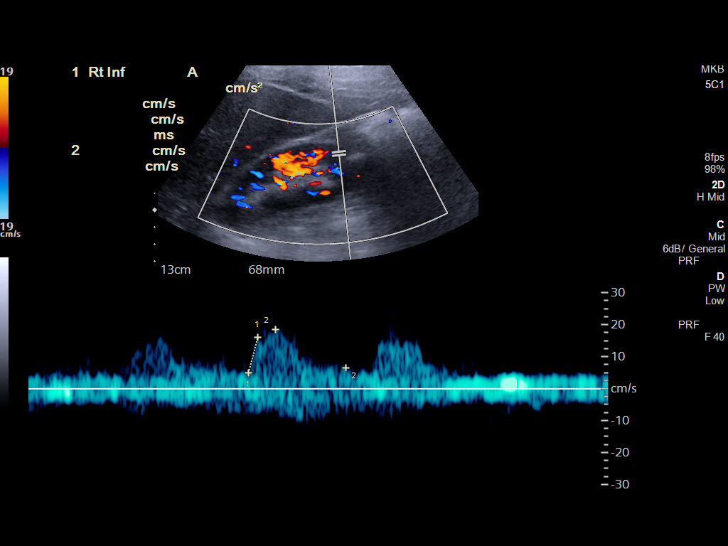
[im 66/72]
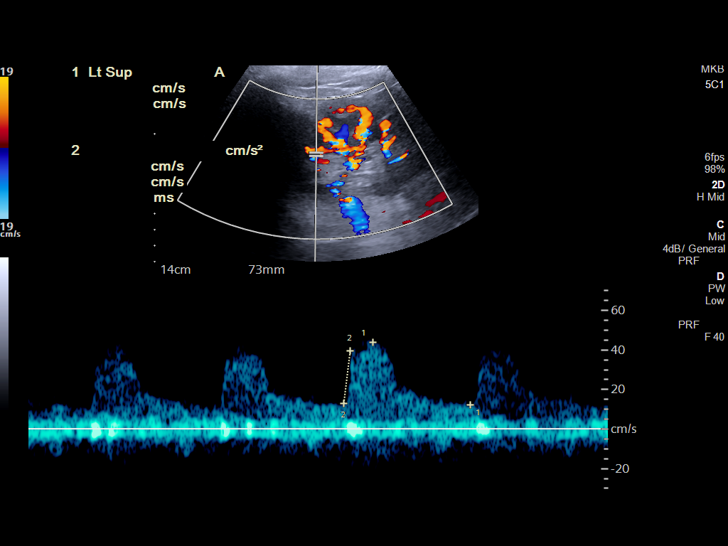
[im 72/72]
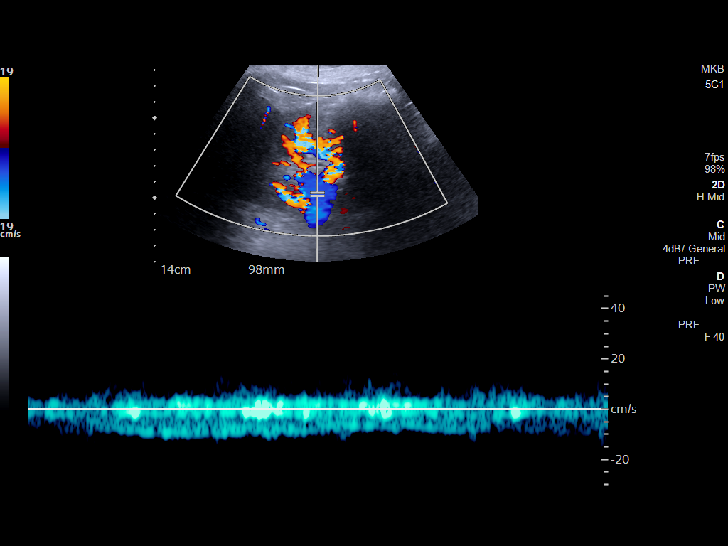

[13 of 25 positions shown; findings below may reference images not displayed]

FINDINGS: Right kidney 10.1 cm length. Renal vein patent at the hilum. Renal
Artery Velocities:

Origin:  56 cm/sec

Mid:  52 cm/sec

Hilum:  71 cm/sec

Interlobar:  38 cm/sec

Arcuate: 25 cm/sec

Left kidney 9.9 cm length. Renal vein patent at the hilum. Renal
Artery Velocities:

Origin:  87 cm/sec

Mid:  102 cm/sec

Hilum:  70 cm/sec

Interlobar:  43 cm/sec

Arcuate:  25 cm/sec

Aortic Velocity: 53 cm/sec.  No aneurysm.

Right Renal-Aortic Ratios:

Origin:

Mid:

Hilum:

Interlobar:

Arcuate:

Left Renal-Aortic Ratios:

Origin:

Mid:

Hilum:

Interlobar:

Arcuate:

Urinary bladder incompletely distended, unremarkable.
IMPRESSION: 1. No Doppler ultrasound evidence of hemodynamically significant
renal artery stenosis.
If there is continued clinical concern, renal MRA (lower radiation
risk, can be performed noncontrast in the setting of renal
dysfunction) and CTA ( higher spatial resolution) represent more
accurate studies, which are additionally more sensitive to the
detection of duplicated renal arteries.

## 2021-05-07 DIAGNOSIS — I10 Essential (primary) hypertension: Secondary | ICD-10-CM | POA: Diagnosis not present

## 2021-05-07 DIAGNOSIS — N189 Chronic kidney disease, unspecified: Secondary | ICD-10-CM | POA: Diagnosis not present

## 2021-05-07 DIAGNOSIS — R8281 Pyuria: Secondary | ICD-10-CM | POA: Diagnosis not present

## 2021-05-16 DIAGNOSIS — E531 Pyridoxine deficiency: Secondary | ICD-10-CM | POA: Diagnosis not present

## 2021-05-16 DIAGNOSIS — E559 Vitamin D deficiency, unspecified: Secondary | ICD-10-CM | POA: Diagnosis not present

## 2021-05-16 DIAGNOSIS — E538 Deficiency of other specified B group vitamins: Secondary | ICD-10-CM | POA: Diagnosis not present

## 2021-05-16 DIAGNOSIS — E519 Thiamine deficiency, unspecified: Secondary | ICD-10-CM | POA: Diagnosis not present

## 2021-05-16 DIAGNOSIS — R208 Other disturbances of skin sensation: Secondary | ICD-10-CM | POA: Diagnosis not present

## 2021-05-16 DIAGNOSIS — G629 Polyneuropathy, unspecified: Secondary | ICD-10-CM | POA: Diagnosis not present

## 2021-05-17 ENCOUNTER — Other Ambulatory Visit: Payer: Self-pay | Admitting: Student

## 2021-05-17 DIAGNOSIS — G629 Polyneuropathy, unspecified: Secondary | ICD-10-CM

## 2021-05-27 DIAGNOSIS — H66003 Acute suppurative otitis media without spontaneous rupture of ear drum, bilateral: Secondary | ICD-10-CM | POA: Diagnosis not present

## 2021-05-29 ENCOUNTER — Ambulatory Visit: Payer: Medicare Other

## 2021-05-29 DIAGNOSIS — R0609 Other forms of dyspnea: Secondary | ICD-10-CM | POA: Diagnosis not present

## 2021-05-29 DIAGNOSIS — N1831 Chronic kidney disease, stage 3a: Secondary | ICD-10-CM | POA: Diagnosis not present

## 2021-05-29 DIAGNOSIS — R600 Localized edema: Secondary | ICD-10-CM | POA: Diagnosis not present

## 2021-05-29 DIAGNOSIS — K219 Gastro-esophageal reflux disease without esophagitis: Secondary | ICD-10-CM | POA: Diagnosis not present

## 2021-05-29 DIAGNOSIS — E119 Type 2 diabetes mellitus without complications: Secondary | ICD-10-CM | POA: Diagnosis not present

## 2021-05-29 DIAGNOSIS — R42 Dizziness and giddiness: Secondary | ICD-10-CM | POA: Diagnosis not present

## 2021-05-29 DIAGNOSIS — R002 Palpitations: Secondary | ICD-10-CM | POA: Diagnosis not present

## 2021-05-29 DIAGNOSIS — I10 Essential (primary) hypertension: Secondary | ICD-10-CM | POA: Diagnosis not present

## 2021-05-29 DIAGNOSIS — E669 Obesity, unspecified: Secondary | ICD-10-CM | POA: Diagnosis not present

## 2021-05-30 ENCOUNTER — Encounter: Payer: Self-pay | Admitting: Gastroenterology

## 2021-05-30 ENCOUNTER — Ambulatory Visit (INDEPENDENT_AMBULATORY_CARE_PROVIDER_SITE_OTHER): Payer: Medicare Other | Admitting: Gastroenterology

## 2021-05-30 VITALS — BP 157/84 | HR 84 | Temp 98.0°F | Wt 159.0 lb

## 2021-05-30 DIAGNOSIS — K31A0 Gastric intestinal metaplasia, unspecified: Secondary | ICD-10-CM | POA: Diagnosis not present

## 2021-05-30 DIAGNOSIS — K5900 Constipation, unspecified: Secondary | ICD-10-CM

## 2021-05-30 NOTE — Progress Notes (Signed)
Natasha Bouillon, MD [address]  [address]  [place], [place] [address]  Main: [phone]  Fax: [phone]   Primary Care Physician: [name], [name], MD   Chief complaint: Constipation  HPI: Natasha Phillips is a 70 y.o. female here for follow-up.  Denies any abdominal pain, nausea or vomiting.  Good appetite.  No weight loss.  Reports some constipation.  Has a bowel movement every 2 to 3 days.  Only uses MiraLAX as needed, about once or twice a week.  No blood in stool.  Reports itching from hemorrhoids  EGD in July 2022 showed intestinal metaplasia on stomach biopsies.  Gastrin level was normal.  Antiparietal cell antibody and intrinsic factor antibodies were normal.  Previous history: H. pylori IgG was previously positive in January 2022 and was treated by PCP H. pylori stool antigen was negative after that and pathology did not show any evidence of H. pylori either  ROS: All ROS reviewed and negative except as per HPI   Past Medical History:  Diagnosis Date   Anemia    CKD (chronic kidney disease)    Diabetes mellitus without complication (HCC)    Glaucoma    [facility] with macular edema and Dr. Frederico Hamman sees Q3-4 months   Hypertension     Past Surgical History:  Procedure Laterality Date   BIOPSY THYROID     CESAREAN SECTION     1981 1988   COLONOSCOPY WITH PROPOFOL N/A 01/26/2021   Procedure: COLONOSCOPY WITH PROPOFOL;  Surgeon: [name], MD;  Location: [facility] ENDOSCOPY;  Service: Endoscopy;  Laterality: N/A;   ESOPHAGOGASTRODUODENOSCOPY (EGD) WITH PROPOFOL N/A 01/26/2021   Procedure: ESOPHAGOGASTRODUODENOSCOPY (EGD) WITH PROPOFOL;  Surgeon: [name], MD;  Location: [facility] END[facility]COPY;  Service: Endoscopy;  Laterality: N/A;   TUBAL LIGATION      Prior to Admission medications   Medication Sig Start Date End Date Taking? Authorizing Provider  aspirin EC 81 MG tablet Take 81 mg by mouth daily.   Yes [provider]   cloNIDine (CATAPRES) 0.1 MG tablet Take 1 tablet by mouth 2 (two) times daily as needed. 10/11/20  Yes [provider]  ELDERBERRY PO Take by mouth.   Yes [provider]  famotidine (PEPCID) 20 MG tablet Take 1 tablet (20 mg total) by mouth daily. 07/13/20  Yes Armbruster, Willaim Rayas, MD  famotidine (PEPCID) 20 MG tablet Take 1 tablet (20 mg total) by mouth daily. 02/12/21  Yes Natasha Phillips B, MD  Ferrous Sulfate 27 MG TABS Take by mouth.   Yes [provider]  fluticasone (FLONASE) 50 MCG/ACT nasal spray Place 2 sprays into both nostrils daily. prn 10/04/20  Yes [name], [name], MD  furosemide (LASIX) 20 MG tablet Take 1 tablet (20 mg total) by mouth daily as needed for fluid or edema. 07/13/19  Yes Margaretann Loveless, PA-C  hydrocortisone (ANUSOL-HC) 2.5 % rectal cream Place 1 application rectally 3 (three) times daily. 05/17/20  Yes [name], [name], MD  hydrocortisone 2.5 % cream Insert rectally for internal hemorrhoids. This is being prescribed instead of suppository as this is under formulary for pt and the suppository is not 01/26/21  Yes Zaryia Markel, Michel Bickers B, MD  Inulin (FIBER CHOICE FRUITY BITES) 1.5 g CHEW Take as directed 07/13/20  Yes Armbruster, Willaim Rayas, MD  loratadine (CLARITIN) 10 MG tablet Take 10 mg by mouth daily as needed for allergies.   Yes [provider]  magnesium oxide (MAG-OX) 400 MG tablet  Take 400 mg by mouth daily.   Yes [provider]  metFORMIN (GLUCOPHAGE) 1000 MG tablet Take 1 tablet (1,000 mg total) by mouth daily. 10/04/20  Yes [name], [name], MD  Multiple Vitamin (MULTIVITAMIN WITH MINERALS) TABS tablet Take 1 tablet by mouth daily.   Yes [provider]  NIFEdipine (PROCARDIA XL/NIFEDICAL XL) 60 MG 24 hr tablet TAKE 1 TABLET(60 MG) BY MOUTH DAILY 10/04/20  Yes [name], [name], MD  olmesartan (BENICAR) 5 MG tablet Take 3 tablets (15 mg total) by mouth in the morning and at  bedtime. Per [facility] Dr Austin Miles goal blood pressure <130/<80 12/25/20  Yes [name], [name], MD  Polyethylene Glycol POWD Take 17 g by mouth daily. 10/30/20  Yes [name], MD  Rectal Protectant-Emollient (CALMOL-4) 76-10 % SUPP Take as directed 07/13/20  Yes Armbruster, Willaim Rayas, MD  saxagliptin HCl (ONGLYZA) 2.5 MG TABS tablet Take 1 tablet by mouth daily. 10/02/20  Yes [provider]  triamcinolone cream (KENALOG) 0.1 % Apply 1 application topically 2 (two) times daily. Left lower leg bid prn 08/06/19  Yes [name], [name], MD  ZINC-VITAMIN C PO Take 1 tablet by mouth daily.   Yes [provider]    Family History  Problem Relation Age of Onset   Diabetes Mother    Heart failure Mother    Diabetes Sister    Healthy Sister    Healthy Sister    Healthy Sister    Diabetes Brother    Diabetes Sister    Heart failure Father    Brain cancer Brother    Throat cancer Brother    Glaucoma Other        Runs on Paternal Side   Breast cancer Cousin      Social History   Tobacco Use   Smoking status: Never   Smokeless tobacco: Never  Vaping Use   Vaping Use: Never used  Substance Use Topics   Alcohol use: No   Drug use: No    Allergies as of 05/30/2021 - Review Complete 05/30/2021  Allergen Reaction Noted   Coreg [carvedilol]  09/01/2020   Hctz [hydrochlorothiazide]  07/22/2019   Januvia [sitagliptin] Diarrhea 01/23/2016   Sulfa antibiotics  01/11/2015   Losartan Rash 02/10/2020    Physical Examination:  Constitutional: General:   Alert,  Well-developed, well-nourished, pleasant and cooperative in NAD BP (!) 157/84   Pulse 84   Temp 98 F (36.7 C) (Oral)   Wt 159 lb (72.1 kg)   BMI 30.04 kg/m   Respiratory: Normal respiratory effort  Gastrointestinal:  Soft, non-tender and non-distended without masses, hepatosplenomegaly or hernias noted.  No guarding or rebound tenderness.     Cardiac: No clubbing or edema.  No cyanosis.  Normal posterior tibial pedal pulses noted.  Psych:  Alert and cooperative. Normal mood and affect.  Musculoskeletal:  Normal gait. Head normocephalic, atraumatic. Symmetrical without gross deformities. 5/5 Lower extremity strength bilaterally.  Skin: Warm. Intact without significant lesions or rashes. No jaundice.  Neck: Supple, trachea midline  Lymph: No cervical lymphadenopathy  Psych:  Alert and oriented x3, Alert and cooperative. Normal mood and affect.  Labs: CMP     Component Value Date/Time   NA 137 08/06/2019 1343   NA 141 07/12/2019 0954   K 4.2 08/06/2019 1343   CL 102 08/06/2019 1343   CO2 30 08/06/2019 1343   GLUCOSE 90 08/06/2019 1343   BUN 29 (H) 08/06/2019 1343   BUN 28 (H) 07/12/2019  0277   CREATININE 1.09 08/06/2019 1343   CALCIUM 10.5 08/06/2019 1343   PROT 8.3 08/24/2019 1158   ALBUMIN 4.2 07/12/2019 0954   AST 18 07/12/2019 0954   ALT 14 07/12/2019 0954   ALKPHOS 108 07/12/2019 0954   BILITOT 0.2 07/12/2019 0954   GFRNONAA 55 (L) 07/12/2019 0954   GFRAA 63 07/12/2019 0954   Lab Results  Component Value Date   WBC 5.0 09/07/2020   HGB 11.8 (L) 09/07/2020   HCT 34.4 (L) 09/07/2020   MCV 87.1 09/07/2020   PLT 287.0 09/07/2020    Imaging Studies:   Assessment and Plan:   KENNEDEY DIGILIO is a 70 y.o. y/o female here for follow-up and is reporting constipation  High-fiber diet MiraLAX daily with goal of 1-2 soft bowel movements daily.  If not at goal, patient instructed to increase dose to twice daily.  If loose stools with the medication, patient asked to decrease the medication to every other day, or half dose daily.  Patient verbalized understanding  If constipation does not improve with MiraLAX use, patient vies to call us and she verbalized understanding and we can start pharmacologic therapy  We discussed hemorrhoid banding due to symptomatic hemorrhoids.  However, she is denying any blood from her hemorrhoids and just has itching.  This  is likely to get better with improvement of her constipation.  She is not interested in banding at this time but will consider it and will call us if she changes her mind.  This will need to be done with Dr. Tobi Bastos or Dr. Allegra Lai if patient elects to proceed with this in the future  Finding of gastric intestinal metaplasia need for mapping biopsies discussed in detail today.  Importance of biopsies to rule out dysplasia discussed as well.  Patient does not want to schedule at this time but is willing to follow-up in clinic in the next 3 to 4 months and schedule at that time.  Patient advised to avoid frequent NSAID use.  We discussed that high salt foods have been low diet to see if this can help with these findings, but results were inconclusive.  However, I have recommended a healthy diet in general, and avoiding high salt foods.  Patient does not smoke.  Dr Natasha Phillips

## 2021-06-05 ENCOUNTER — Telehealth: Payer: Self-pay | Admitting: Gastroenterology

## 2021-06-05 NOTE — Telephone Encounter (Signed)
Inbound call from pt requesting a call back to sch her endo/colon.

## 2021-06-08 ENCOUNTER — Ambulatory Visit
Admission: RE | Admit: 2021-06-08 | Discharge: 2021-06-08 | Disposition: A | Discharge status: Home / Self Care | Payer: Medicare Other | Source: Ambulatory Visit | Attending: Student | Admitting: Student

## 2021-06-08 ENCOUNTER — Other Ambulatory Visit: Payer: Self-pay

## 2021-06-08 DIAGNOSIS — R202 Paresthesia of skin: Secondary | ICD-10-CM | POA: Diagnosis not present

## 2021-06-08 DIAGNOSIS — G629 Polyneuropathy, unspecified: Secondary | ICD-10-CM | POA: Insufficient documentation

## 2021-06-13 DIAGNOSIS — Z23 Encounter for immunization: Secondary | ICD-10-CM | POA: Diagnosis not present

## 2021-06-18 ENCOUNTER — Other Ambulatory Visit: Payer: Self-pay

## 2021-06-18 DIAGNOSIS — K31A0 Gastric intestinal metaplasia, unspecified: Secondary | ICD-10-CM

## 2021-06-18 NOTE — Telephone Encounter (Signed)
Pt scheduled for EGD

## 2021-06-20 ENCOUNTER — Telehealth: Payer: Self-pay | Admitting: Internal Medicine

## 2021-06-20 DIAGNOSIS — U071 COVID-19: Secondary | ICD-10-CM | POA: Diagnosis not present

## 2021-06-20 DIAGNOSIS — Z20822 Contact with and (suspected) exposure to covid-19: Secondary | ICD-10-CM | POA: Diagnosis not present

## 2021-06-20 DIAGNOSIS — M791 Myalgia, unspecified site: Secondary | ICD-10-CM | POA: Diagnosis not present

## 2021-06-20 NOTE — Telephone Encounter (Signed)
Pt called in stating that she has tested positive today at [facility]. Pt stated she had her 3rd Covid Booster on Dec 7,2022. Pt stated she started having ear pain on Saturday, Dec 10 that cause Pt to go to [facility] to get check. Pt was wondering what should she do. Pt was wondering if she should take the covid antiviral medication that they have out. Pt requesting callback.

## 2021-06-20 NOTE — Telephone Encounter (Signed)
I think she should take covid 19 medication [facility] Molnupiravir  Quarantine x 10-14 days at home  Call [facility] back  multivitamin Elderberry  Oil of oregano  cepacol or chloroseptic spray  Warm tea with honey and lemon  Hydration  Try to eat though you dont feel like it   Tylenol or Advil  Nasal saline 2 sprays Flonase 2 sprays nasal congestion  Warm tea honey and lemon    Monitor pulse oximeter, buy from [facility] if oxygen is less than 90 please go to the hospital.        Are you feeling really sick? Shortness of breath, cough, chest pain?, dizziness? Confusion   If so let me know  If worsening, go to hospital or [facility] Urgent care  ([facility]) for further treatment.

## 2021-06-20 NOTE — Telephone Encounter (Signed)
Please advise, if Patient is seen and medication prescribed is it safe for Patient to take the Covid 19 medication?

## 2021-06-25 NOTE — Telephone Encounter (Signed)
Patient did not receive the antiviral from the Urgent care.

## 2021-06-25 NOTE — Telephone Encounter (Signed)
Called Patient and informed of the below. She states her legs are not swollen and painful but she is retaining fluid and they feel heavy. Patient states that she had a MRI done on 06/08/21 with Neurology and they informed the Patient that she had fluid build up and she needed to contact her PCP about a possible fluid pill.   Patient states she called our office on 06/08/21 and gave the person on the phone this information. States she was told that our office was booked up and she would not be able to see DR French Ana [name] about her legs. Patient asked to be put on a cancellation list and was told that our office probably would not have any cancellations in the time she needed to be seen. Informed the Patient that there is no documentation in her chart or access nurse that she called and apologized to her. Patient states that she did call our office. States that she then went to the urgent care about her legs and that is when she found out she had Covid   Please advise on fluid pills for Patient  Cc to [place] Noe in regards to call documentation

## 2021-06-25 NOTE — Telephone Encounter (Signed)
If outside of 5 days too late for covid 19 therapy and if + b/l leg swelling rec back to ED for evaluation b/l US doppler recommended in the ED

## 2021-06-25 NOTE — Telephone Encounter (Signed)
Received call from Patients daughter: Natasha Phillips, Natasha Phillips Daughter [phone].  Informed of the information below.  States Patient went to go be seen originally for fluid build up in her legs. Once tested for COVID they did not address the Patient's legs at all. Swollen, heavy, and pain in the legs Friday. States pain is better today but still some fluid build up in both legs.   Please advise on bilateral leg swelling.

## 2021-06-26 ENCOUNTER — Ambulatory Visit: Payer: Medicare Other

## 2021-06-29 NOTE — Telephone Encounter (Signed)
LMTCB

## 2021-06-29 NOTE — Telephone Encounter (Signed)
Pt is already on fluid pill per the kidney team indapamide  Is she taking nifedipine still this can cause leg swelling I have not seen her for some time needs appt  Can try compression socks [employer] coppertone over the counter and she may need stronger if these do not work  Reduce fluid intake more than 64 ounces daily  Reduce salt/processes foods  Consider [place] legs to rule out dvt if leg swelling but needs to be seen in person for this so if worried rec ED or urgent care

## 2021-07-03 ENCOUNTER — Other Ambulatory Visit: Payer: Self-pay

## 2021-07-03 ENCOUNTER — Encounter: Payer: Self-pay | Admitting: Internal Medicine

## 2021-07-03 ENCOUNTER — Telehealth (INDEPENDENT_AMBULATORY_CARE_PROVIDER_SITE_OTHER): Payer: Medicare Other | Admitting: Internal Medicine

## 2021-07-03 VITALS — BP 120/74 | Ht 61.0 in | Wt 162.0 lb

## 2021-07-03 DIAGNOSIS — R5381 Other malaise: Secondary | ICD-10-CM

## 2021-07-03 DIAGNOSIS — R601 Generalized edema: Secondary | ICD-10-CM | POA: Diagnosis not present

## 2021-07-03 DIAGNOSIS — R937 Abnormal findings on diagnostic imaging of other parts of musculoskeletal system: Secondary | ICD-10-CM

## 2021-07-03 DIAGNOSIS — Z1231 Encounter for screening mammogram for malignant neoplasm of breast: Secondary | ICD-10-CM

## 2021-07-03 DIAGNOSIS — M6281 Muscle weakness (generalized): Secondary | ICD-10-CM

## 2021-07-03 DIAGNOSIS — M5416 Radiculopathy, lumbar region: Secondary | ICD-10-CM

## 2021-07-03 DIAGNOSIS — I10 Essential (primary) hypertension: Secondary | ICD-10-CM | POA: Diagnosis not present

## 2021-07-03 DIAGNOSIS — M7989 Other specified soft tissue disorders: Secondary | ICD-10-CM | POA: Diagnosis not present

## 2021-07-03 DIAGNOSIS — E049 Nontoxic goiter, unspecified: Secondary | ICD-10-CM

## 2021-07-03 DIAGNOSIS — M5020 Other cervical disc displacement, unspecified cervical region: Secondary | ICD-10-CM | POA: Diagnosis not present

## 2021-07-03 NOTE — Patient Instructions (Addendum)
Consider MRI low  back let me know ready to order   07/20/2021 Clinical Support Nephrology Rolm Baptise, MD   [address]   [place] [address]   [place], [place] [address]   [phone] (Work)   [phone] ([address])     [place], [place]      07/26/2021 Office Visit Nephrology Rolm Baptise, MD   [address]   [place] [address]   [place], [place] [address]   [phone] (Work)   [phone] (Fax)        Edema Edema is an abnormal buildup of fluids in the body tissues and under the skin. Swelling of the legs, feet, and ankles is a common symptom that becomes more likely as you get older. Swelling is also common in looser tissues, like around the eyes. When the affected area is squeezed, the fluid may move out of that spot and leave a dent for a few moments. This dent is called pitting edema. There are many possible causes of edema. Eating too much salt (sodium) and being on your feet or sitting for a long time can cause edema in your legs, feet, and ankles. Hot weather may make edema worse. Common causes of edema include: Heart failure. Liver or kidney disease. Weak leg blood vessels. Cancer. An injury. Pregnancy. Medicines. Being obese. Low protein levels in the blood. Edema is usually painless. Your skin may look swollen or shiny. Follow these instructions at home: Keep the affected body part raised (elevated) above the level of your heart when you are sitting or lying down. Do not sit still or stand for long periods of time. Do not wear tight clothing. Do not wear garters on your upper legs. Exercise your legs to get your circulation going. This helps to move the fluid back into your blood vessels, and it may help the swelling go down. Wear elastic bandages or support stockings to reduce swelling as told by your health care provider. Eat a low-salt (low-sodium) diet to reduce fluid as told by your health care provider. Depending on the cause of your swelling, you may need  to limit how much fluid you drink (fluid restriction). Take over-the-counter and prescription medicines only as told by your health care provider. Contact a health care provider if: Your edema does not get better with treatment. You have heart, liver, or kidney disease and have symptoms of edema. You have sudden and unexplained weight gain. Get help right away if: You develop shortness of breath or chest pain. You cannot breathe when you lie down. You develop pain, redness, or warmth in the swollen areas. You have heart, liver, or kidney disease and suddenly get edema. You have a fever and your symptoms suddenly get worse. Summary Edema is an abnormal buildup of fluids in the body tissues and under the skin. Eating too much salt (sodium) and being on your feet or sitting for a long time can cause edema in your legs, feet, and ankles. Keep the affected body part raised (elevated) above the level of your heart when you are sitting or lying down. This information is not intended to replace advice given to you by your health care provider. Make sure you discuss any questions you have with your health care provider. Document Revised: 11/23/2020 Document Reviewed: 04/18/2020 Elsevier Patient Education  2022 [employer].

## 2021-07-03 NOTE — Progress Notes (Signed)
Virtual Visit via Video Note  I connected with Natasha Phillips  on 07/03/21 at 11:30 AM EST by a video enabled telemedicine application and verified that I am speaking with the correct person using two identifiers.  Location patient: home, [place] Location provider:work or home office Persons participating in the virtual visit: patient, provider  I discussed the limitations of evaluation and management by telemedicine and the availability of in person appointments. The patient expressed understanding and agreed to proceed.   HPI:  Acute telemedicine visit for : Nonspecific complaint of body being heavy and feeling weighed down which comes and goes and also generalized weight gain and fluid build up in body. She c/o fullness from neck down to her waist and below MRI 06/08/21 generalized disc bulging and facet spurring causing mild wasting of the cord C3/4 and C4/5         Shes had extensive labs with neurology and renal ana, rf negative, esr elevated 40 nl 0-30 05/16/21 slightly, M spike normal, B complex vitamins and iron normal  Echo 09/09/19 normal vascular US left leg +reflux no DVT in the past  She does have h/o snoring no dx of sleep apnea in the past  Denies anxiety/depression  US abdomen normal  Kidney function stable  Denies sob  Also c/o left lower leg sciatica sxs and pain with h/o abnormal Xray lower back in the past 11/24/19 FINDINGS: Frontal, bilateral oblique, lateral views of the lumbar spine are obtained. There are 5 non-rib-bearing lumbar type vertebral bodies identified. Minimal grade 1 anterolisthesis of L4 on L5 is noted. There are no pars defects.   Disc spaces are relatively well preserved. There is multilevel facet hypertrophy greatest at L4-5 and L5-S1. Sacroiliac joints are normal.   IMPRESSION: 1. Lower lumbar facet hypertrophy. 2. Minimal grade 1 anterolisthesis of L4 on L5. 3. No acute bony abnormality.     3. Htn and ckd 2a gfr 69 creatinine 0.91 per Dr.  Smith Mince on benicar 5 mg tid and lasix 20 mg with K per Dr. Smith Mince will take clonidine 0.1 if BP >130/80 and not currently taking lozol 1.25 (indapamide)   -COVID-19 vaccine status: 4/4 last 06/13/21 had covid 06/20/21   ROS: See pertinent positives and negatives per HPI.  Past Medical History:  Diagnosis Date   Anemia    CKD (chronic kidney disease)    COVID-19    [facility]21 [facility]   Diabetes mellitus without complication ([place])    Glaucoma    [facility] with macular edema and Dr. Rupert Stacks sees Q3-4 months   Hypertension     Past Surgical History:  Procedure Laterality Date   BIOPSY THYROID     CESAREAN SECTION     1981 1988   COLONOSCOPY WITH PROPOFOL N/A 01/26/2021   Procedure: COLONOSCOPY WITH PROPOFOL;  Surgeon: Virgel Manifold, MD;  Location: [facility] ENDOSCOPY;  Service: Endoscopy;  Laterality: N/A;   ESOPHAGOGASTRODUODENOSCOPY (EGD) WITH PROPOFOL N/A 01/26/2021   Procedure: ESOPHAGOGASTRODUODENOSCOPY (EGD) WITH PROPOFOL;  Surgeon: Virgel Manifold, MD;  Location: [facility] ENDOSCOPY;  Service: Endoscopy;  Laterality: N/A;   TUBAL LIGATION       Current Outpatient Medications:    aspirin EC 81 MG tablet, Take 81 mg by mouth daily., Disp: , Rfl:    cloNIDine (CATAPRES) 0.1 MG tablet, Take 1 tablet by mouth 2 (two) times daily as needed., Disp: , Rfl:    ELDERBERRY PO, Take by mouth., Disp: , Rfl:    Ferrous Sulfate 27 MG TABS, Take by  mouth., Disp: , Rfl:    fluticasone (FLONASE) 50 MCG/ACT nasal spray, Place 2 sprays into both nostrils daily. prn, Disp: 16 g, Rfl: 11   furosemide (LASIX) 20 MG tablet, Take 1 tablet (20 mg total) by mouth daily as needed for fluid or edema., Disp: 30 tablet, Rfl: 3   indapamide (LOZOL) 1.25 MG tablet, Take 1.25 mg by mouth daily., Disp: , Rfl:    Inulin (FIBER CHOICE FRUITY BITES) 1.5 g CHEW, Take as directed, Disp: 30 tablet, Rfl: 0   loratadine (CLARITIN) 10 MG tablet, Take 10 mg by mouth daily as needed for allergies., Disp: , Rfl:     magnesium oxide (MAG-OX) 400 MG tablet, Take 400 mg by mouth daily., Disp: , Rfl:    metFORMIN (GLUCOPHAGE) 1000 MG tablet, Take 1 tablet (1,000 mg total) by mouth daily., Disp: 90 tablet, Rfl: 4   Multiple Vitamin (MULTIVITAMIN WITH MINERALS) TABS tablet, Take 1 tablet by mouth daily., Disp: , Rfl:    olmesartan (BENICAR) 5 MG tablet, Take 3 tablets (15 mg total) by mouth in the morning and at bedtime. Per [facility] Dr Smith Mince goal blood pressure <130/<80, Disp: 540 tablet, Rfl: 3   Rectal Protectant-Emollient (CALMOL-4) 76-10 % SUPP, Take as directed, Disp: 6 suppository, Rfl: 0  EXAM:  VITALS per patient if applicable:  GENERAL: alert, oriented, appears well and in no acute distress  HEENT: atraumatic, conjunttiva clear, no obvious abnormalities on inspection of external nose and ears  NECK: normal movements of the head and neck  LUNGS: on inspection no signs of respiratory distress, breathing rate appears normal, no obvious gross SOB, gasping or wheezing  CV: no obvious cyanosis  MS: moves all visible extremities without noticeable abnormality  PSYCH/NEURO: pleasant and cooperative, no obvious depression or anxiety, speech and thought processing grossly intact  ASSESSMENT AND PLAN:  Discussed the following assessment and plan:  Lumbar radiculopathy Disc with pt consider MRI lumbar declines for now  Abnormal MRI cervical 06/08/21 -consider EMG/NCS upper b/l arms and lower b/l legs  Cc Dr. Manuella Ghazi neurology  Want to know if he thinks pt should see neurosurgery as well or do EMG/NCS 1st?  Otherwise body heaviness nonspecific complaint and I do not have a reason for this pt had extensive labs and imaging  In the past   Leg swelling F/u with renal Dr. Orson Ape   Generalized edema with htn - Plan: TSH [place] ab normal  Echo normal  CKD2a stable  On benicar 5 mg tid, lasix 20 mg qd with K per pt held lozol/indapamide per Dr. Smith Mince renal due to on lasix now Will ask renal to manage  fluid Also [place] + left venus reflux lower extremity could be contributing to left lower leg swelling at times  Muscle weakness - Plan: CK (Creatine Kinase), C-reactive protein Consider emg/ncs with neurology kc   Thyroid goiter - Plan: TSH  Physical deconditioning Consider PT in the future   HM Flu shot declines  prevnar utd  pna 25 utd no need to update due to age per guidelines  Tdap utd  Consider shingrix vaccines in future  covid 19 4/4 moderna had covid 06/20/21+    Eye MD seen Dr Chanetta Marshall, [facility] MD 04/11/20  Glaucoma suspect    Pap 05/13/19 negative neg HPV    mammo 08/09/20 neg ordered   Colonoscopy had in 09/30/2011 per pt she was ~61 per chart review was normal though I am unable to review report pt states  she had at [facility] -need to get copy of report due 09/29/21   sch 07/27/21 Dr. Marius Ditch egd  Had colonoscopy 01/26/21   dexa 08/09/20 negative Never smoker but exposed to 2nd hand via husband  09/09/19 renal US duplex normal     09/19/15 thyroid US with 10/04/15 benign bx  IMPRESSION: 1. Thyromegaly with bilateral small nodules. Dominant right lesion meets consensus criteria for biopsy. Ultrasound-guided fine needle aspiration should be considered, as per the consensus statement: Management of Thyroid Nodules Detected at [place]: Society of Radiologists in [place]. Radiology 2005; 096:283-662   leb Gi Dr Rhea Belton    Echo 09/09/19  FINDINGS   Left Ventricle: Left ventricular ejection fraction, by estimation, is 55  to 60%. Left ventricular ejection fraction by PLAX is 57 % The left  ventricle has normal function. The left ventricle has no regional wall  motion abnormalities. The left  ventricular internal cavity size was normal in size. There is no left  ventricular hypertrophy. Left ventricular diastolic parameters were  normal.   Right Ventricle: The right ventricular size is normal. No increase in  right  ventricular wall thickness. Right ventricular systolic function is  normal.   Left Atrium: Left atrial size was normal in size.   Right Atrium: Right atrial size was normal in size.   Pericardium: There is no evidence of pericardial effusion.   Mitral Valve: The mitral valve is grossly normal. Trivial mitral valve  regurgitation. MV peak gradient, 8.9 mmHg. The mean mitral valve gradient  is 5.0 mmHg.   Tricuspid Valve: The tricuspid valve is grossly normal. Tricuspid valve  regurgitation is mild.   Aortic Valve: The aortic valve was not well visualized. Aortic valve  regurgitation is not visualized. No aortic stenosis is present. Aortic  valve mean gradient measures 6.0 mmHg. Aortic valve peak gradient measures  11.4 mmHg. Aortic valve area, by VTI  measures 1.75 cm.   Pulmonic Valve: The pulmonic valve was not well visualized. Pulmonic valve  regurgitation is not visualized.   Aorta: The aortic root was not well visualized and the aortic root is  normal in size and structure.   IAS/Shunts: The atrial septum is grossly normal.   [place] 09/07/20  IMPRESSION: No sonographic finding to explain the patient's abdominal pain.     Electronically Signed   By: Audie Pinto M.D.   On: 09/07/2020 13:53   VAS [place] lower ext venous reflux 11/22/19 Summary:  Bilateral:  - No evidence of deep vein thrombosis seen in the lower extremities,  bilaterally, from the common femoral through the popliteal veins.     - No evidence of superficial venous thrombosis in the lower extremities,  bilaterally.      - No evidence of deep venous insufficiency seen bilaterally in the lower  extremity.     - No evidence of superficial venous reflux seen in the short saphenous  veins bilaterally.     Right:  - No evidence of superficial venous reflux seen in the right greater  saphenous vein.      Left:  - Venous reflux is noted in the left sapheno-femoral junction.     *See table(s) above  for measurements and observations.   Electronically signed by Leotis Pain MD on 11/23/2019 at 11:11:44 AM.  -we discussed possible serious and likely etiologies, options for evaluation and workup, limitations of telemedicine visit vs in person visit, treatment, treatment risks and precautions. Pt is agreeable to treatment via telemedicine at this moment.  I discussed the assessment and treatment plan with the patient. The patient was provided an opportunity to ask questions and all were answered. The patient agreed with the plan and demonstrated an understanding of the instructions.    Time spent 20 min Delorise Jackson, MD

## 2021-07-04 NOTE — Telephone Encounter (Signed)
Discussed via video visit 07/03/21

## 2021-07-16 ENCOUNTER — Other Ambulatory Visit: Payer: Self-pay

## 2021-07-16 ENCOUNTER — Other Ambulatory Visit (HOSPITAL_COMMUNITY)
Admission: RE | Admit: 2021-07-16 | Discharge: 2021-07-16 | Disposition: A | Discharge status: Home / Self Care | Payer: Medicare Other | Source: Ambulatory Visit | Attending: Obstetrics and Gynecology | Admitting: Obstetrics and Gynecology

## 2021-07-16 ENCOUNTER — Encounter: Payer: Self-pay | Admitting: Obstetrics and Gynecology

## 2021-07-16 ENCOUNTER — Ambulatory Visit (INDEPENDENT_AMBULATORY_CARE_PROVIDER_SITE_OTHER): Payer: Medicare Other | Admitting: Obstetrics and Gynecology

## 2021-07-16 VITALS — BP 142/78 | HR 72 | Ht 61.0 in | Wt 158.0 lb

## 2021-07-16 DIAGNOSIS — Z124 Encounter for screening for malignant neoplasm of cervix: Secondary | ICD-10-CM

## 2021-07-16 DIAGNOSIS — Z01419 Encounter for gynecological examination (general) (routine) without abnormal findings: Secondary | ICD-10-CM | POA: Insufficient documentation

## 2021-07-16 DIAGNOSIS — Z1151 Encounter for screening for human papillomavirus (HPV): Secondary | ICD-10-CM | POA: Diagnosis not present

## 2021-07-16 DIAGNOSIS — R300 Dysuria: Secondary | ICD-10-CM | POA: Diagnosis not present

## 2021-07-16 DIAGNOSIS — Z1239 Encounter for other screening for malignant neoplasm of breast: Secondary | ICD-10-CM | POA: Diagnosis not present

## 2021-07-16 DIAGNOSIS — Z1231 Encounter for screening mammogram for malignant neoplasm of breast: Secondary | ICD-10-CM

## 2021-07-16 NOTE — Patient Instructions (Signed)
[facility] [address] [place] [address]  [facility] [place]  [address] [place] [address]  Phone: [phone]  

## 2021-07-16 NOTE — Progress Notes (Signed)
Gynecology Annual Exam   PCP: [name], Nino Glow, MD  Chief Complaint: No chief complaint on file.   History of Present Illness: Patient is a 71 y.o. G2P2 presents for annual exam. The patient has no complaints today.   LMP: No LMP recorded. Patient is postmenopausal. No PMB  The patient is sexually active. She denies dyspareunia.  The patient does perform self breast exams.  There is no notable family history of breast or ovarian cancer in her family.  The patient wears seatbelts: yes.   The patient has regular exercise: not asked.    The patient denies current symptoms of depression.    Review of Systems: Review of Systems  Constitutional:  Negative for chills and fever.  HENT:  Negative for congestion.   Respiratory:  Negative for cough and shortness of breath.   Cardiovascular:  Negative for chest pain and palpitations.  Gastrointestinal:  Negative for abdominal pain, constipation, diarrhea, heartburn, nausea and vomiting.  Genitourinary:  Negative for dysuria, frequency and urgency.  Skin:  Negative for itching and rash.  Neurological:  Negative for dizziness and headaches.  Endo/Heme/Allergies:  Negative for polydipsia.  Psychiatric/Behavioral:  Negative for depression.    Past Medical History:  Patient Active Problem List   Diagnosis Date Noted   Herniated disc, cervical 07/03/2021   Thyroid goiter 07/03/2021   Hypertension 07/03/2021   Abnormal MRI, cervical spine 07/03/2021   Abdominal pain    Hiatal hernia    Gastric atrophy    Special screening for malignant neoplasms, colon    Polyp of sigmoid colon    H. pylori infection 09/07/2020   Arthritis 09/01/2020   Hypertension associated with diabetes ([place]) 05/19/2020   Scoliosis of thoracolumbar spine 05/19/2020   Facet hypertrophy of lumbar region 05/19/2020   Cervical arthritis 11/26/2019   Benign hypertensive kidney disease with chronic kidney disease 11/15/2019   Stage 3a chronic kidney disease  ([place]) 11/15/2019   Hypertensive chronic kidney disease with stage 1 through stage 4 chronic kidney disease, or unspecified chronic kidney disease 11/15/2019   Pain in limb 11/12/2019   Neck pain 08/24/2019   Disorder of both eustachian tubes 08/24/2019   Leg cramps 08/09/2019   CKD (chronic kidney disease) stage 2, GFR 60-89 ml/min 08/03/2019    Dr. Abigail Butts    Leg edema 07/22/2019   Hyponatremia 04/17/2019   Gastroesophageal reflux disease 09/16/2018   Erosion of oral mucosa 10/28/2016   Thyroid nodule 10/14/2015    Benign bx 09/2015    Abnormal cervical Papanicolaou smear 01/11/2015   Absolute anemia 01/11/2015   Mild mitral insufficiency 01/11/2015    INTERPRETATION ECHO 12/07/14 NORMAL LEFT VENTRICULAR SYSTOLIC FUNCTION WITH AN ESTIMATED EF = >55 % NORMAL RIGHT VENTRICULAR SYSTOLIC FUNCTION MILD MITRAL VALVE INSUFFICIENCY TRACE TRICUSPID VALVE INSUFFICIENCY NO VALVULAR STENOSIS   FINDINGS Myoview 12/07/14: Regional wall motion:  reveals normal myocardial thickening and wall  motion. The overall quality of the study is good.   Artifacts noted: no Left ventricular cavity: normal.  Perfusion Analysis:  SPECT images demonstrate homogeneous tracer  distribution throughout the myocardium.      CN (constipation) 01/11/2015   Calcium blood increased 01/11/2015   LBP (low back pain) 01/11/2015   Obesity (BMI 30-39.9) 01/11/2015   Leg paresthesia 01/11/2015   Neuralgia neuritis, sciatic nerve 01/11/2015   Avitaminosis D 01/11/2015   Essential hypertension 11/22/2014   Controlled type 2 diabetes mellitus without complication ([place]) [id number]    Overview:  Urine microalbumin-20 on 02/15/2013.  Past Surgical History:  Past Surgical History:  Procedure Laterality Date   BIOPSY THYROID     CESAREAN SECTION     1981 1988   COLONOSCOPY WITH PROPOFOL N/A 01/26/2021   Procedure: COLONOSCOPY WITH PROPOFOL;  Surgeon: Virgel Manifold, MD;  Location: [facility] ENDOSCOPY;  Service:  Endoscopy;  Laterality: N/A;   ESOPHAGOGASTRODUODENOSCOPY (EGD) WITH PROPOFOL N/A 01/26/2021   Procedure: ESOPHAGOGASTRODUODENOSCOPY (EGD) WITH PROPOFOL;  Surgeon: Virgel Manifold, MD;  Location: [facility] ENDOSCOPY;  Service: Endoscopy;  Laterality: N/A;   TUBAL LIGATION      Gynecologic History:  No LMP recorded. Patient is postmenopausal. Contraception: post menopausal status Last Pap: Results were: NIL and HR HPV negative   Obstetric History: G2P2  Family History:  Family History  Problem Relation Age of Onset   Diabetes Mother    Heart failure Mother    Diabetes Sister    Healthy Sister    Healthy Sister    Healthy Sister    Diabetes Brother    Diabetes Sister    Heart failure Father    Brain cancer Brother    Throat cancer Brother    Glaucoma Other        Runs on Paternal Side   Breast cancer Cousin     Social History:  Social History   Socioeconomic History   Marital status: Widowed    Spouse name: Not on file   Number of children: 2   Years of education: College   Highest education level: Not on file  Occupational History   Occupation: Retired 2014    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 [employer]  Tobacco Use   Smoking status: Never   Smokeless tobacco: Never  Vaping Use   Vaping Use: Never used  Substance and Sexual Activity   Alcohol use: No   Drug use: No   Sexual activity: Not Currently    Birth control/protection: None  Other Topics Concern   Not on file  Social History Narrative   Lives at home    Social Determinants of Health   Financial Resource Strain: Low Risk    Difficulty of Paying Living Expenses: Not very hard  Food Insecurity: No Food Insecurity   Worried About Charity fundraiser in the Last Year: Never true   Ran Out of Food in the Last Year: Never true  Transportation Needs: Not on file  Physical Activity: Sufficiently Active   Days of Exercise per Week: 7 days   Minutes of Exercise per Session: 30 min  Stress: Not on file  Social  Connections: Not on file  Intimate Partner Violence: Not on file    Allergies:  Allergies  Allergen Reactions   Coreg [Carvedilol]     Could not tolerate     Hctz [Hydrochlorothiazide]     Low na 124    Januvia [Sitagliptin] Diarrhea   Promethazine Hives and Itching   Sulfa Antibiotics    Losartan Rash    Medications: Prior to Admission medications   Medication Sig Start Date End Date Taking? Authorizing Provider  aspirin EC 81 MG tablet Take 81 mg by mouth daily.    [provider]  cloNIDine (CATAPRES) 0.1 MG tablet Take 1 tablet by mouth 2 (two) times daily as needed. 10/11/20   [provider]  ELDERBERRY PO Take by mouth.    [provider]  Ferrous Sulfate 27 MG TABS Take by mouth.    [provider]  fluticasone (FLONASE) 50 MCG/ACT nasal spray Place 2 sprays  into both nostrils daily. prn 10/04/20   [name], Nino Glow, MD  furosemide (LASIX) 20 MG tablet Take 1 tablet (20 mg total) by mouth daily as needed for fluid or edema. 07/13/19   Mar Daring, PA-C  indapamide (LOZOL) 1.25 MG tablet Take 1.25 mg by mouth daily. 06/30/21   [provider]  Inulin (FIBER CHOICE FRUITY BITES) 1.5 g CHEW Take as directed 07/13/20   Armbruster, Carlota Raspberry, MD  loratadine (CLARITIN) 10 MG tablet Take 10 mg by mouth daily as needed for allergies.    [provider]  magnesium oxide (MAG-OX) 400 MG tablet Take 400 mg by mouth daily.    [provider]  metFORMIN (GLUCOPHAGE) 1000 MG tablet Take 1 tablet (1,000 mg total) by mouth daily. 10/04/20   [name], Nino Glow, MD  Multiple Vitamin (MULTIVITAMIN WITH MINERALS) TABS tablet Take 1 tablet by mouth daily.    [provider]  olmesartan (BENICAR) 5 MG tablet Take 3 tablets (15 mg total) by mouth in the morning and at bedtime. Per [facility] Dr Smith Mince goal blood pressure <130/<80 12/25/20   [name], Nino Glow, MD  Rectal Protectant-Emollient (CALMOL-4) 76-10 %  SUPP Take as directed 07/13/20   Armbruster, Carlota Raspberry, MD    Physical Exam Vitals: There were no vitals taken for this visit.  General: NAD HEENT: normocephalic, anicteric Thyroid: no enlargement, no palpable nodules Pulmonary: No increased work of breathing, CTAB Cardiovascular: RRR, distal pulses 2+ Breast: Breast symmetrical, no tenderness, no palpable nodules or masses, no skin or nipple retraction present, no nipple discharge.  No axillary or supraclavicular lymphadenopathy. Abdomen: NABS, soft, non-tender, non-distended.  Umbilicus without lesions.  No hepatomegaly, splenomegaly or masses palpable. No evidence of hernia  Genitourinary:  External: Normal external female genitalia.  Normal urethral meatus, normal Bartholin's and Skene's glands.    Vagina: Normal vaginal mucosa, no evidence of prolapse.    Cervix: Grossly normal in appearance, no bleeding  Uterus: Non-enlarged, mobile, normal contour.  No CMT  Adnexa: ovaries non-enlarged, no adnexal masses  Rectal: deferred  Lymphatic: no evidence of inguinal lymphadenopathy Extremities: no edema, erythema, or tenderness Neurologic: Grossly intact Psychiatric: mood appropriate, affect full  Female chaperone present for pelvic and breast  portions of the physical exam    Assessment: 71 y.o. G2P2 routine annual exam  Plan: Problem List Items Addressed This Visit   None Visit Diagnoses     Encounter for gynecological examination without abnormal finding    -  Primary   Screening for malignant neoplasm of cervix       Relevant Orders   Cytology - PAP   Breast screening       Breast cancer screening by mammogram       Dysuria       Relevant Orders   Urine Culture       1) STI screening  was notoffered and therefore not obtained  2)  ASCCP guidelines and rational discussed.  Patient opts for  every 2 years  screening interval  3) Mammogram due in February 2023  4) Routine healthcare maintenance including  cholesterol, diabetes screening discussed managed by PCP  5) No follow-ups on file.   Malachy Mood, MD, [facility], [facility] 07/16/2021, 8:51 AM

## 2021-07-17 ENCOUNTER — Encounter: Payer: Self-pay | Admitting: Obstetrics and Gynecology

## 2021-07-17 LAB — CYTOLOGY - PAP
Comment: NEGATIVE
Diagnosis: NEGATIVE
High risk HPV: NEGATIVE

## 2021-07-18 LAB — URINE CULTURE

## 2021-07-20 DIAGNOSIS — I10 Essential (primary) hypertension: Secondary | ICD-10-CM | POA: Diagnosis not present

## 2021-07-20 DIAGNOSIS — N181 Chronic kidney disease, stage 1: Secondary | ICD-10-CM | POA: Diagnosis not present

## 2021-07-24 ENCOUNTER — Telehealth: Payer: Self-pay

## 2021-07-24 NOTE — Telephone Encounter (Signed)
Patient states that she had COVID in December and is worried about being put to sleep with blood clots. She would like to move her procedure to 09/10/21. Called trish and got patient moved and sent new paperwork to [employer]

## 2021-07-26 DIAGNOSIS — I10 Essential (primary) hypertension: Secondary | ICD-10-CM | POA: Diagnosis not present

## 2021-07-26 DIAGNOSIS — N181 Chronic kidney disease, stage 1: Secondary | ICD-10-CM | POA: Diagnosis not present

## 2021-07-26 DIAGNOSIS — E119 Type 2 diabetes mellitus without complications: Secondary | ICD-10-CM | POA: Diagnosis not present

## 2021-08-16 DIAGNOSIS — R208 Other disturbances of skin sensation: Secondary | ICD-10-CM | POA: Diagnosis not present

## 2021-08-16 DIAGNOSIS — R252 Cramp and spasm: Secondary | ICD-10-CM | POA: Diagnosis not present

## 2021-08-16 DIAGNOSIS — N183 Chronic kidney disease, stage 3 unspecified: Secondary | ICD-10-CM | POA: Diagnosis not present

## 2021-08-16 DIAGNOSIS — H8113 Benign paroxysmal vertigo, bilateral: Secondary | ICD-10-CM | POA: Diagnosis not present

## 2021-08-16 DIAGNOSIS — G939 Disorder of brain, unspecified: Secondary | ICD-10-CM | POA: Diagnosis not present

## 2021-08-30 DIAGNOSIS — E113211 Type 2 diabetes mellitus with mild nonproliferative diabetic retinopathy with macular edema, right eye: Secondary | ICD-10-CM | POA: Diagnosis not present

## 2021-08-30 DIAGNOSIS — H25013 Cortical age-related cataract, bilateral: Secondary | ICD-10-CM | POA: Diagnosis not present

## 2021-08-30 DIAGNOSIS — H04129 Dry eye syndrome of unspecified lacrimal gland: Secondary | ICD-10-CM | POA: Diagnosis not present

## 2021-09-10 ENCOUNTER — Encounter: Admission: RE | Payer: Self-pay | Source: Home / Self Care

## 2021-09-10 ENCOUNTER — Ambulatory Visit: Admission: RE | Admit: 2021-09-10 | Payer: Medicare Other | Source: Home / Self Care | Admitting: Gastroenterology

## 2021-09-10 SURGERY — ESOPHAGOGASTRODUODENOSCOPY (EGD) WITH PROPOFOL
Anesthesia: General

## 2021-09-25 ENCOUNTER — Other Ambulatory Visit (INDEPENDENT_AMBULATORY_CARE_PROVIDER_SITE_OTHER): Payer: Medicare Other

## 2021-09-25 ENCOUNTER — Other Ambulatory Visit: Payer: Self-pay

## 2021-09-25 DIAGNOSIS — M6281 Muscle weakness (generalized): Secondary | ICD-10-CM

## 2021-09-25 DIAGNOSIS — E049 Nontoxic goiter, unspecified: Secondary | ICD-10-CM

## 2021-09-25 DIAGNOSIS — R601 Generalized edema: Secondary | ICD-10-CM | POA: Diagnosis not present

## 2021-09-25 LAB — C-REACTIVE PROTEIN: CRP: <1 mg/dL (ref 0.5–20.0)

## 2021-09-25 LAB — TSH: TSH: 0.99 u[IU]/mL (ref 0.35–5.50)

## 2021-09-25 LAB — CK: Total CK: 103 U/L (ref 7–177)

## 2021-09-26 ENCOUNTER — Telehealth: Payer: Self-pay | Admitting: Internal Medicine

## 2021-09-26 NOTE — Telephone Encounter (Signed)
Thressa Sheller, CMA  ?09/26/2021  2:51 PM EDT Back to Top  ?  ?Left message to return call.  ? Delorise Jackson, MD  ?09/26/2021  8:13 AM EDT   ?  ?Thyroid normal  ?Muscle enzymes and inflammatory marker normal   ? ?

## 2021-10-17 ENCOUNTER — Ambulatory Visit
Admission: RE | Admit: 2021-10-17 | Discharge: 2021-10-17 | Disposition: A | Discharge status: Home / Self Care | Payer: Medicare Other | Source: Ambulatory Visit | Attending: Internal Medicine | Admitting: Internal Medicine

## 2021-10-17 DIAGNOSIS — Z1231 Encounter for screening mammogram for malignant neoplasm of breast: Secondary | ICD-10-CM | POA: Insufficient documentation

## 2021-10-19 ENCOUNTER — Ambulatory Visit (INDEPENDENT_AMBULATORY_CARE_PROVIDER_SITE_OTHER): Payer: Medicare Other | Admitting: Internal Medicine

## 2021-10-19 ENCOUNTER — Encounter: Payer: Self-pay | Admitting: Internal Medicine

## 2021-10-19 VITALS — BP 134/82 | HR 79 | Temp 98.4°F | Resp 14 | Ht 61.0 in | Wt 157.4 lb

## 2021-10-19 DIAGNOSIS — E119 Type 2 diabetes mellitus without complications: Secondary | ICD-10-CM | POA: Diagnosis not present

## 2021-10-19 DIAGNOSIS — Z1231 Encounter for screening mammogram for malignant neoplasm of breast: Secondary | ICD-10-CM | POA: Diagnosis not present

## 2021-10-19 DIAGNOSIS — M7989 Other specified soft tissue disorders: Secondary | ICD-10-CM | POA: Diagnosis not present

## 2021-10-19 DIAGNOSIS — M47819 Spondylosis without myelopathy or radiculopathy, site unspecified: Secondary | ICD-10-CM | POA: Diagnosis not present

## 2021-10-19 DIAGNOSIS — R700 Elevated erythrocyte sedimentation rate: Secondary | ICD-10-CM | POA: Diagnosis not present

## 2021-10-19 NOTE — Progress Notes (Signed)
Chief Complaint  ?Patient presents with  ? Acute Visit  ?  Pt arrives to clinic c/o bilateral leg swelling mainly in R, she states it feels like gravity is holding her down. Had a similar situation last yr which they referred her to neuro and they did an MRI which showed inflammation in her body.   ? ?F/u  ?1. Leg swelling b/l and h/o venous reflux on left prior US and leg swelling and tightness mostly on the right and sore and she feels heaviness from the waist down. She saw neurology in the past and disc c/w MRI inflammation in her body denies pain today. She feels heavy from the abdomen down  ?2. Htn on benicar 15 mg bid, lozol 1.25 mg qd (diuretic)  ?F/u [facility] Dr. Austin MilesVoora  ? ? ? ?Review of Systems  ?Constitutional:  Negative for weight loss.  ?HENT:  Negative for hearing loss.   ?Eyes:  Negative for blurred vision.  ?Respiratory:  Negative for shortness of breath.   ?Cardiovascular:  Positive for leg swelling. Negative for chest pain.  ?Gastrointestinal:  Negative for abdominal pain and blood in stool.  ?Genitourinary:  Negative for dysuria.  ?Musculoskeletal:  Negative for falls and joint pain.  ?Skin:  Negative for rash.  ?Neurological:  Negative for headaches.  ?Psychiatric/Behavioral:  Negative for depression.   ?Past Medical History:  ?Diagnosis Date  ? Anemia   ? CKD (chronic kidney disease)   ? COVID-19   ? 06/18/2021 [facility]  ? Diabetes mellitus without complication (HCC)   ? Glaucoma   ? [facility] with macular edema and Dr. Frederico Hammanenika Johnson sees Q3-4 months  ? Hypertension   ? ?Past Surgical History:  ?Procedure Laterality Date  ? BIOPSY THYROID    ? CESAREAN SECTION    ? 1981 1988  ? COLONOSCOPY WITH PROPOFOL N/A 01/26/2021  ? Procedure: COLONOSCOPY WITH PROPOFOL;  Surgeon: Pasty Spillersahiliani, Varnita B, MD;  Location: [facility] ENDOSCOPY;  Service: Endoscopy;  Laterality: N/A;  ? ESOPHAGOGASTRODUODENOSCOPY (EGD) WITH PROPOFOL N/A 01/26/2021  ? Procedure: ESOPHAGOGASTRODUODENOSCOPY (EGD) WITH PROPOFOL;  Surgeon:  Pasty Spillersahiliani, Varnita B, MD;  Location: [facility] ENDOSCOPY;  Service: Endoscopy;  Laterality: N/A;  ? TUBAL LIGATION    ? ?Family History  ?Problem Relation Age of Onset  ? Diabetes Mother   ? Heart failure Mother   ? Diabetes Sister   ? Healthy Sister   ? Healthy Sister   ? Healthy Sister   ? Diabetes Brother   ? Diabetes Sister   ? Heart failure Father   ? Brain cancer Brother   ? Throat cancer Brother   ? Glaucoma Other   ?     Runs on Paternal Side  ? Breast cancer Cousin   ? ?Social History  ? ?Socioeconomic History  ? Marital status: Widowed  ?  Spouse name: Not on file  ? Number of children: 2  ? Years of education: College  ? Highest education level: Not on file  ?Occupational History  ? Occupation: Retired 2014  ?  Comment: Previously Case Manager [employer]  ?Tobacco Use  ? Smoking status: Never  ? Smokeless tobacco: Never  ?Vaping Use  ? Vaping Use: Never used  ?Substance and Sexual Activity  ? Alcohol use: No  ? Drug use: No  ? Sexual activity: Not Currently  ?  Birth control/protection: None  ?Other Topics Concern  ? Not on file  ?Social History Narrative  ? Lives at home   ? ?Social Determinants of Health  ? ?  Financial Resource Strain: Not on file  ?Food Insecurity: Not on file  ?Transportation Needs: Not on file  ?Physical Activity: Not on file  ?Stress: Not on file  ?Social Connections: Not on file  ?Intimate Partner Violence: Not on file  ? ?Current Meds  ?Medication Sig  ? aspirin EC 81 MG tablet Take 81 mg by mouth daily.  ? cloNIDine (CATAPRES) 0.1 MG tablet Take 1 tablet by mouth 2 (two) times daily as needed.  ? ELDERBERRY PO Take by mouth.  ? Ferrous Sulfate 27 MG TABS Take by mouth.  ? fluticasone (FLONASE) 50 MCG/ACT nasal spray Place 2 sprays into both nostrils daily. prn  ? furosemide (LASIX) 20 MG tablet Take 1 tablet (20 mg total) by mouth daily as needed for fluid or edema.  ? indapamide (LOZOL) 1.25 MG tablet Take 1.25 mg by mouth daily.  ? magnesium oxide (MAG-OX) 400 MG tablet Take 400 mg by mouth  daily.  ? metFORMIN (GLUCOPHAGE) 1000 MG tablet Take 1 tablet (1,000 mg total) by mouth daily.  ? Multiple Vitamin (MULTIVITAMIN WITH MINERALS) TABS tablet Take 1 tablet by mouth daily.  ? olmesartan (BENICAR) 5 MG tablet Take 3 tablets (15 mg total) by mouth in the morning and at bedtime. Per [facility] Dr Austin Miles goal blood pressure <130/<80  ? Rectal Protectant-Emollient (CALMOL-4) 76-10 % SUPP Take as directed  ? ?Allergies  ?Allergen Reactions  ? Coreg [Carvedilol]   ?  Could not tolerate  ?  ? Hctz [Hydrochlorothiazide]   ?  Low na 124 ?  ? Januvia [Sitagliptin] Diarrhea  ? Promethazine Hives and Itching  ? Sulfa Antibiotics   ? Losartan Rash  ? ?Recent Results (from the past 2160 hour(s))  ?TSH     Status: None  ? Collection Time: 09/25/21  9:37 AM  ?Result Value Ref Range  ? TSH 0.99 0.35 - 5.50 uIU/mL  ?C-reactive protein     Status: None  ? Collection Time: 09/25/21  9:37 AM  ?Result Value Ref Range  ? CRP <1.0 0.5 - 20.0 mg/dL  ?CK (Creatine Kinase)     Status: None  ? Collection Time: 09/25/21  9:37 AM  ?Result Value Ref Range  ? Total CK 103 7 - 177 U/L  ? ?Objective  ?Body mass index is 29.74 kg/m?. ?Wt Readings from Last 3 Encounters:  ?10/19/21 157 lb 6.4 oz (71.4 kg)  ?07/16/21 158 lb (71.7 kg)  ?07/03/21 162 lb (73.5 kg)  ? ?Temp Readings from Last 3 Encounters:  ?10/19/21 98.4 ?F (36.9 ?C) (Oral)  ?05/30/21 98 ?F (36.7 ?C) (Oral)  ?02/12/21 97.7 ?F (36.5 ?C)  ? ?BP Readings from Last 3 Encounters:  ?10/19/21 134/82  ?07/16/21 (!) 142/78  ?07/03/21 120/74  ? ?Pulse Readings from Last 3 Encounters:  ?10/19/21 79  ?07/16/21 72  ?05/30/21 84  ? ? ?Physical Exam ?Vitals and nursing note reviewed.  ?Constitutional:   ?   Appearance: Normal appearance. She is well-developed and well-groomed.  ?HENT:  ?   Head: Normocephalic and atraumatic.  ?Eyes:  ?   Conjunctiva/sclera: Conjunctivae normal.  ?   Pupils: Pupils are equal, round, and reactive to light.  ?Cardiovascular:  ?   Rate and Rhythm: Normal rate and  regular rhythm.  ?   Heart sounds: Normal heart sounds. No murmur heard. ?Pulmonary:  ?   Effort: Pulmonary effort is normal.  ?   Breath sounds: Normal breath sounds.  ?Abdominal:  ?   General: Abdomen is flat. Bowel sounds  are normal.  ?   Tenderness: There is no abdominal tenderness.  ?Musculoskeletal:     ?   General: No tenderness.  ?Skin: ?   General: Skin is warm and dry.  ?Neurological:  ?   General: No focal deficit present.  ?   Mental Status: She is alert and oriented to person, place, and time. Mental status is at baseline.  ?   Cranial Nerves: Cranial nerves 2-12 are intact.  ?   Motor: Motor function is intact.  ?   Coordination: Coordination is intact.  ?   Gait: Gait is intact.  ?Psychiatric:     ?   Attention and Perception: Attention and perception normal.     ?   Mood and Affect: Mood and affect normal.     ?   Speech: Speech normal.     ?   Behavior: Behavior normal. Behavior is cooperative.     ?   Thought Content: Thought content normal.     ?   Cognition and Memory: Cognition and memory normal.     ?   Judgment: Judgment normal.  ? ? ?Assessment  ?Plan  ?Leg swelling ?Disc with pt could be due venous reflux in left lower leg  ?Wears compression stockings already wears them  ?Disc with Dr. Austin Miles renal if pt can do 1.25 indapamide qd but also do bid prn  ?Dash diet ?Echo 09/09/19  ?Heart pump function normal  ?Mild mitral and tricuspid regurgitation  ?This is nothing to be worried about  ? ?Elevated sed rate ?etiology she did not have arthritis in 2015 on imaging but this is newer on imaging in 2021  ?Arthritis of spine ?IMPRESSION: ?1. Generalized disc bulging and facet spurring causing mild wasting ?of the cord at C3-4 and C4-5. ?2. No foraminal impingement or visible inflammation. ?  ?Type 2 diabetes mellitus without complication, without long-term current use of insulin (HCC) - check A1C in the future  ? ?HM ?Flu shot declines  ?prevnar utd  ?pna 25 utd no need to update due to age per  guidelines  ?Tdap utd  ?Consider shingrix vaccines in future  ?covid 19 4/4 [employer] had covid 06/20/21+  ?  ?Eye MD seen Dr Rosalene Billings, [place] ?Retina MD 04/11/20  ?Glaucoma suspect  ?  ?Pap 05/13/19 nega

## 2021-10-19 NOTE — Patient Instructions (Addendum)
Erythrocyte Sedimentation Rate Test ?Why am I having this test? ?The erythrocyte sedimentation rate (ESR) test is used to help find illnesses related to: ?Sudden (acute) or long-term (chronic) infections. ?Inflammation. ?The body's disease-fighting system (immune system) attacking healthy cells (autoimmune diseases). ?Cancer. ?Tissue death. ?If you have symptoms that may be related to any of these illnesses, your health care provider may do an ESR test before doing more specific tests. If you have an inflammatory immune disease, such as rheumatoid arthritis or polymyalgia rheumatica, you may have this test to help monitor your therapy. ?What is being tested? ?This test measures how long it takes for your red blood cells (erythrocytes) to settle in a solution over a certain amount of time (sedimentation rate). When you have an infection or inflammation, your red blood cells clump together and settle faster. The sedimentation rate provides information about how much inflammation is present in the body. ?What kind of sample is taken? ? ?A blood sample is required for this test. It is usually collected by inserting a needle into a blood vessel. ?How do I prepare for this test? ?Follow any instructions from your health care provider about changing or stopping your regular medicines. ?Tell a health care provider about: ?Any allergies you have. ?All medicines you are taking, including vitamins, herbs, eye drops, creams, and over-the-counter medicines. ?Any bleeding problems you have. ?Any surgeries you have had. ?Any medical conditions you have, such as thyroid or kidney disease. ?Whether you are pregnant or may be pregnant. ?How are the results reported? ?Your test results will be reported as a value that measures sedimentation rate in millimeters per hour (mm/hr). Your health care provider will compare your results to normal ranges that were established after testing a large group of people (reference ranges). Reference  ranges may vary among labs and hospitals. For this test, common reference ranges, which vary by age and gender, are: ?Newborn: 0-2 mm/hr. ?Child, up to puberty: 0-10 mm/hr. ?Female: ?Under 50 years: 0-20 mm/hr. ?50-85 years: 0-30 mm/hr. ?Over 85 years: 0-42 mm/hr. ?Female: ?Under 50 years: 0-15 mm/hr. ?50-85 years: 0-20 mm/hr. ?Over 85 years: 0-30 mm/hr. ?Certain conditions or medicines may cause ESR levels to be falsely lower or higher, such as: ?Pregnancy. ?Obesity. ?Steroids and birth control pills. ?Anemia. ?What do the results mean? ?Results that are within reference ranges are considered normal, meaning that the level of inflammation in your body is healthy. High ESR levels mean that there is inflammation in your body. You will have more tests to help make a diagnosis. Inflammation may result from many different conditions or injuries. ?Talk with your health care provider about what your results mean. ?Questions to ask your health care provider ?Ask your health care provider, or the department that is doing the test: ?When will my results be ready? ?How will I get my results? ?What are my treatment options? ?What other tests do I need? ?What are my next steps? ?Summary ?The erythrocyte sedimentation rate (ESR) test is used to help find illnesses associated with sudden (acute) or long-term (chronic) infections, inflammation, autoimmune diseases, cancer, or tissue death. ?If you have symptoms that may be related to any of these illnesses, your health care provider may do an ESR test before doing more specific tests. If you have an inflammatory immune disease, such as rheumatoid arthritis, you may have this test to help monitor your therapy. ?This test measures how long it takes for your red blood cells (erythrocytes) to settle in a solution over a certain  amount of time (sedimentation rate). This provides information about how much inflammation is present in the body. ?This information is not intended to replace  advice given to you by your health care provider. Make sure you discuss any questions you have with your health care provider. ?Document Revised: 01/26/2021 Document Reviewed: 01/26/2021 ?Elsevier Patient Education ? [place]. ? ?Arthritis ?Arthritis is a term that is commonly used to refer to joint pain or joint disease. There are more than 100 types of arthritis. ?What are the causes? ?The most common cause of this condition is wear and tear of a joint. Other causes include: ?Gout. ?Inflammation of a joint. ?An infection of a joint. ?Sprains and other injuries near the joint. ?A reaction to medicines or drugs, or an allergic reaction. ?In some cases, the cause may not be known. ?What are the signs or symptoms? ?The main symptom of this condition is pain in the joint during movement. Other symptoms include: ?Redness, swelling, or stiffness at a joint. ?Warmth coming from the joint. ?Fever. ?Overall feeling of illness. ?How is this diagnosed? ?This condition may be diagnosed with a physical exam and tests, including: ?Blood tests. ?Urine tests. ?Imaging tests, such as X-rays, an MRI, or a CT scan. ?Sometimes, fluid is removed from a joint for testing. ?How is this treated? ?This condition may be treated with: ?Treatment of the cause, if it is known. ?Rest. ?Raising (elevating) the joint. ?Applying cold or hot packs to the joint. ?Medicines to improve symptoms and reduce inflammation. ?Injections of a steroid, such as cortisone, into the joint to help reduce pain and inflammation. ?Depending on the cause of your arthritis, you may need to make lifestyle changes to reduce stress on your joint. Changes may include: ?Exercising more. ?Losing weight. ?Follow these instructions at home: ?Medicines ?Take over-the-counter and prescription medicines only as told by your health care provider. ?Do not take aspirin to relieve pain if your health care provider thinks that gout may be causing your pain. ?Activity ?Rest  your joint if told by your health care provider. Rest is important when your disease is active and your joint feels painful, swollen, or stiff. ?Avoid activities that make the pain worse. Balance activity with rest. ?Exercise your joint regularly with range-of-motion exercises as told by your health care provider. Try doing low-impact exercise, such as: ?Swimming. ?Water aerobics. ?Biking. ?Walking. ?Managing pain, stiffness, and swelling ? ?  ? ?If directed, put ice on the affected joint. To do this: ?Put ice in a plastic bag. ?Place a towel between your skin and the bag. ?Leave the ice on for 20 minutes, 2-3 times a day. ?Remove the ice if your skin turns bright red. This is very important. If you cannot feel pain, heat, or cold, you have a greater risk of damage to the area. ?If your joint is swollen, raise (elevate) it above the level of your heart if directed by your health care provider. ?If your joint feels stiff in the morning, try taking a warm shower. ?If directed, apply heat to the affected area as often as told by your health care provider. Use the heat source that your health care provider recommends, such as a moist heat pack or a heating pad. To apply heat: ?Place a towel between your skin and the heat source. ?Leave the heat on for 20-30 minutes. ?Remove the heat if your skin turns bright red. This is especially important if you are unable to feel pain, heat, or cold. You have a  greater risk of getting burned. ?General instructions ?Maintain a healthy weight. Follow instructions from your health care provider for weight control. ?Do not use any products that contain nicotine or tobacco. These products include cigarettes, chewing tobacco, and vaping devices, such as e-cigarettes. If you need help quitting, ask your health care provider. ?Keep all follow-up visits. This is important. ?Where to find more information ?[employer]: www.niams.[employer].es ?Contact a health care provider  if: ?The pain gets worse. ?You have a fever. ?Get help right away if: ?You develop severe joint pain, swelling, or redness. ?Many joints become painful and swollen. ?You develop severe back pain. ?You develop

## 2021-10-30 DIAGNOSIS — E113413 Type 2 diabetes mellitus with severe nonproliferative diabetic retinopathy with macular edema, bilateral: Secondary | ICD-10-CM | POA: Diagnosis not present

## 2021-10-30 DIAGNOSIS — H2513 Age-related nuclear cataract, bilateral: Secondary | ICD-10-CM | POA: Diagnosis not present

## 2021-10-30 DIAGNOSIS — H40053 Ocular hypertension, bilateral: Secondary | ICD-10-CM | POA: Diagnosis not present

## 2021-10-31 ENCOUNTER — Other Ambulatory Visit: Payer: Self-pay | Admitting: Internal Medicine

## 2021-10-31 DIAGNOSIS — E119 Type 2 diabetes mellitus without complications: Secondary | ICD-10-CM

## 2021-11-27 LAB — HM DIABETES EYE EXAM

## 2021-11-29 DIAGNOSIS — N1831 Chronic kidney disease, stage 3a: Secondary | ICD-10-CM | POA: Diagnosis not present

## 2021-11-29 DIAGNOSIS — R002 Palpitations: Secondary | ICD-10-CM | POA: Diagnosis not present

## 2021-11-29 DIAGNOSIS — I10 Essential (primary) hypertension: Secondary | ICD-10-CM | POA: Diagnosis not present

## 2021-11-29 DIAGNOSIS — E119 Type 2 diabetes mellitus without complications: Secondary | ICD-10-CM | POA: Diagnosis not present

## 2021-11-29 DIAGNOSIS — R1030 Lower abdominal pain, unspecified: Secondary | ICD-10-CM | POA: Diagnosis not present

## 2021-11-30 DIAGNOSIS — H40053 Ocular hypertension, bilateral: Secondary | ICD-10-CM | POA: Diagnosis not present

## 2021-12-06 ENCOUNTER — Ambulatory Visit (INDEPENDENT_AMBULATORY_CARE_PROVIDER_SITE_OTHER): Payer: Medicare Other | Admitting: Gastroenterology

## 2021-12-06 ENCOUNTER — Encounter: Payer: Self-pay | Admitting: Gastroenterology

## 2021-12-06 ENCOUNTER — Other Ambulatory Visit: Payer: Self-pay

## 2021-12-06 VITALS — BP 135/81 | HR 80 | Temp 98.1°F | Ht 61.0 in | Wt 158.4 lb

## 2021-12-06 DIAGNOSIS — K5909 Other constipation: Secondary | ICD-10-CM | POA: Diagnosis not present

## 2021-12-06 DIAGNOSIS — Z1211 Encounter for screening for malignant neoplasm of colon: Secondary | ICD-10-CM

## 2021-12-06 MED ORDER — GOLYTELY 236 G PO SOLR: 4000.0000 mL | Freq: Once | ORAL | 0 refills | Status: AC

## 2021-12-06 NOTE — Progress Notes (Signed)
Arlyss Repress, MD [address]  [address]  [place], [place] [address]  Main: [phone]  Fax: [phone]    Gastroenterology Consultation  Referring Provider:     [name], French Ana * Primary Care Physician:  [name], [name], MD Primary Gastroenterologist:  Dr. Maximino Greenland Reason for Consultation: Constipation        HPI:   STASHA NARAINE is a 71 y.o. female referred by Dr. Judie Grieve, [name], MD  for consultation & management of constipation.  Patient reports that she is no longer experiencing hard bowel movements.  She reports mild periumbilical discomfort only with no particular relation to food or position.  She denies any bulging into the umbilicus.  She underwent colonoscopy in 7/22 which was fair prep.  She also underwent upper endoscopy which revealed focal intestinal metaplasia, underwent autoimmune work-up, negative for intrinsic factor antibodies, parietal cell antibodies.  Her serum gastrin levels are normal.  No evidence of H. pylori on biopsies.  NSAIDs: None  Antiplts/Anticoagulants/Anti thrombotics: None  GI Procedures:  EGD and colonoscopy 01/26/2021- Atrophic and nodular mucosa in the gastric body and antrum. - Small hiatal hernia. - Normal examined duodenum. - Biopsies were obtained in the gastric body, at the incisura and in the gastric antrum.  DIAGNOSIS:  A. STOMACH; COLD BIOPSY:  - CHRONIC GASTRITIS WITH FOCAL ATROPHY AND FOCAL INTESTINAL METAPLASIA,  SEE COMMENT.  - NEGATIVE FOR H. PYLORI BY IMMUNOHISTOCHEMISTRY.  - NEGATIVE FOR DYSPLASIA AND MALIGNANCY.  B.  ESOPHAGUS; COLD BIOPSY:  - STRATIFIED SQUAMOUS EPITHELIUM WITHOUT EOSINOPHILS, NEUTROPHILS, OR  REACTIVE CHANGES.  - NEGATIVE FOR DYSPLASIA AND MALIGNANCY.   C.  COLON POLYP, SIGMOID; COLD SNARE:  - POLYPOID MUCOSA WITH MILD CRYPT HYPERPLASIA AND PROMINENT MUCIPHAGES,  COMPATIBLE WITH INFLAMMATORY-TYPE POLYP.  - NEGATIVE FOR DYSPLASIA AND MALIGNANCY.   -  Preparation of the colon was fair. - Source of intermittent BRBPR is the patient's internal hemorrhoids - One 6 mm polyp in the sigmoid colon, removed with a cold snare. Resected and retrieved. - A single non-bleeding colonic angioectasia. - The examination was otherwise normal. - The rectum, sigmoid colon, descending colon, transverse colon, ascending colon and cecum are normal. - Non-bleeding internal hemorrhoids.   Past Medical History:  Diagnosis Date   Anemia    CKD (chronic kidney disease)    COVID-19    06/18/2021 [facility]   Diabetes mellitus without complication (HCC)    Glaucoma    [facility] with macular edema and Dr. Frederico Hamman sees Q3-4 months   Hypertension     Past Surgical History:  Procedure Laterality Date   BIOPSY THYROID     CESAREAN SECTION     1981 1988   COLONOSCOPY WITH PROPOFOL N/A 01/26/2021   Procedure: COLONOSCOPY WITH PROPOFOL;  Surgeon: [name], MD;  Location: [facility] ENDOSCOPY;  Service: Endoscopy;  Laterality: N/A;   ESOPHAGOGASTRODUODENOSCOPY (EGD) WITH PROPOFOL N/A 01/26/2021   Procedure: ESOPHAGOGASTRODUODENOSCOPY (EGD) WITH PROPOFOL;  Surgeon: [name], MD;  Location: [facility] ENDOSCOPY;  Service: Endoscopy;  Laterality: N/A;   TUBAL LIGATION      Current Outpatient Medications:    aspirin EC 81 MG tablet, Take 81 mg by mouth daily., Disp: , Rfl:    cloNIDine (CATAPRES) 0.1 MG tablet, Take 1 tablet by mouth 2 (two) times daily as needed., Disp: , Rfl:    ELDERBERRY PO, Take by mouth., Disp: , Rfl:    Ferrous Sulfate 27 MG TABS, Take by mouth., Disp: , Rfl:  fluticasone (FLONASE) 50 MCG/ACT nasal spray, Place 2 sprays into both nostrils daily. prn, Disp: 16 g, Rfl: 11   furosemide (LASIX) 20 MG tablet, Take 1 tablet (20 mg total) by mouth daily as needed for fluid or edema., Disp: 30 tablet, Rfl: 3   indapamide (LOZOL) 1.25 MG tablet, Take 1.25 mg by mouth daily., Disp: , Rfl:    Inulin (FIBER CHOICE FRUITY BITES) 1.5 g CHEW,  Take as directed, Disp: 30 tablet, Rfl: 0   loratadine (CLARITIN) 10 MG tablet, Take 10 mg by mouth daily as needed for allergies., Disp: , Rfl:    magnesium oxide (MAG-OX) 400 (240 Mg) MG tablet, Take 1 tablet by mouth daily., Disp: , Rfl:    metFORMIN (GLUCOPHAGE) 1000 MG tablet, TAKE 1 TABLET(1000 MG) BY MOUTH DAILY, Disp: 90 tablet, Rfl: 4   Multiple Vitamin (MULTIVITAMIN WITH MINERALS) TABS tablet, Take 1 tablet by mouth daily., Disp: , Rfl:    olmesartan (BENICAR) 5 MG tablet, Take 3 tablets (15 mg total) by mouth in the morning and at bedtime. Per [facility] Dr Austin Miles goal blood pressure <130/<80, Disp: 540 tablet, Rfl: 3   polyethylene glycol (GOLYTELY) 236 g solution, Take 4,000 mLs by mouth once for 1 dose. 2 day prep, Disp: 8000 mL, Rfl: 0   Rectal Protectant-Emollient (CALMOL-4) 76-10 % SUPP, Take as directed, Disp: 6 suppository, Rfl: 0  Family History  Problem Relation Age of Onset   Diabetes Mother    Heart failure Mother    Diabetes Sister    Healthy Sister    Healthy Sister    Healthy Sister    Diabetes Brother    Diabetes Sister    Heart failure Father    Brain cancer Brother    Throat cancer Brother    Glaucoma Other        Runs on Paternal Side   Breast cancer Cousin      Social History   Tobacco Use   Smoking status: Never   Smokeless tobacco: Never  Vaping Use   Vaping Use: Never used  Substance Use Topics   Alcohol use: No   Drug use: No    Allergies as of 12/06/2021 - Review Complete 12/06/2021  Allergen Reaction Noted   Coreg [carvedilol]  09/01/2020   Hctz [hydrochlorothiazide]  07/22/2019   Januvia [sitagliptin] Diarrhea 01/23/2016   Promethazine Hives and Itching 07/03/2021   Sulfa antibiotics  01/11/2015   Losartan Rash 02/10/2020    Review of Systems:    All systems reviewed and negative except where noted in HPI.   Physical Exam:  BP 135/81 (BP Location: Left Arm, Patient Position: Sitting, Cuff Size: Normal)   Pulse 80   Temp 98.1  F (36.7 C) (Oral)   Ht 5\' 1"  (1.549 m)   Wt 158 lb 6 oz (71.8 kg)   BMI 29.92 kg/m  No LMP recorded. Patient is postmenopausal.  General:   Alert,  Well-developed, well-nourished, pleasant and cooperative in NAD Head:  Normocephalic and atraumatic. Eyes:  Sclera clear, no icterus.   Conjunctiva pink. Ears:  Normal auditory acuity. Nose:  No deformity, discharge, or lesions. Mouth:  No deformity or lesions,oropharynx pink & moist. Neck:  Supple; no masses or thyromegaly. Lungs:  Respirations even and unlabored.  Clear throughout to auscultation.   No wheezes, crackles, or rhonchi. No acute distress. Heart:  Regular rate and rhythm; no murmurs, clicks, rubs, or gallops. Abdomen:  Normal bowel sounds. Soft, non-tender and non-distended without masses, hepatosplenomegaly or hernias  noted.  No guarding or rebound tenderness.   Rectal: Not performed Msk:  Symmetrical without gross deformities. Good, equal movement & strength bilaterally. Pulses:  Normal pulses noted. Extremities:  No clubbing or edema.  No cyanosis. Neurologic:  Alert and oriented x3;  grossly normal neurologically. Skin:  Intact without significant lesions or rashes. No jaundice. Psych:  Alert and cooperative. Normal mood and affect.  Imaging Studies: Reviewed  Assessment and Plan:   Kathrynn Runninghyllis Y Fayson is a 71 y.o. female with history of diabetes, hypertension is seen in consultation for follow-up of constipation.  Patient's constipation has currently resolved.  Discussed with patient regarding screening colonoscopy given that previous colonoscopy in 01/2021 was suboptimal   Follow up as needed   Arlyss Repressohini R Enna Warwick, MD

## 2021-12-28 ENCOUNTER — Other Ambulatory Visit: Payer: Self-pay | Admitting: Internal Medicine

## 2021-12-28 DIAGNOSIS — I10 Essential (primary) hypertension: Secondary | ICD-10-CM

## 2022-01-12 IMAGING — DX DG CHEST 2V
2 series · 2 of 2 positions shown · non-contrast
Comparison: None.

CLINICAL DATA: Bilateral rib pain

EXAM:
CHEST - 2 VIEW

[chest pa]
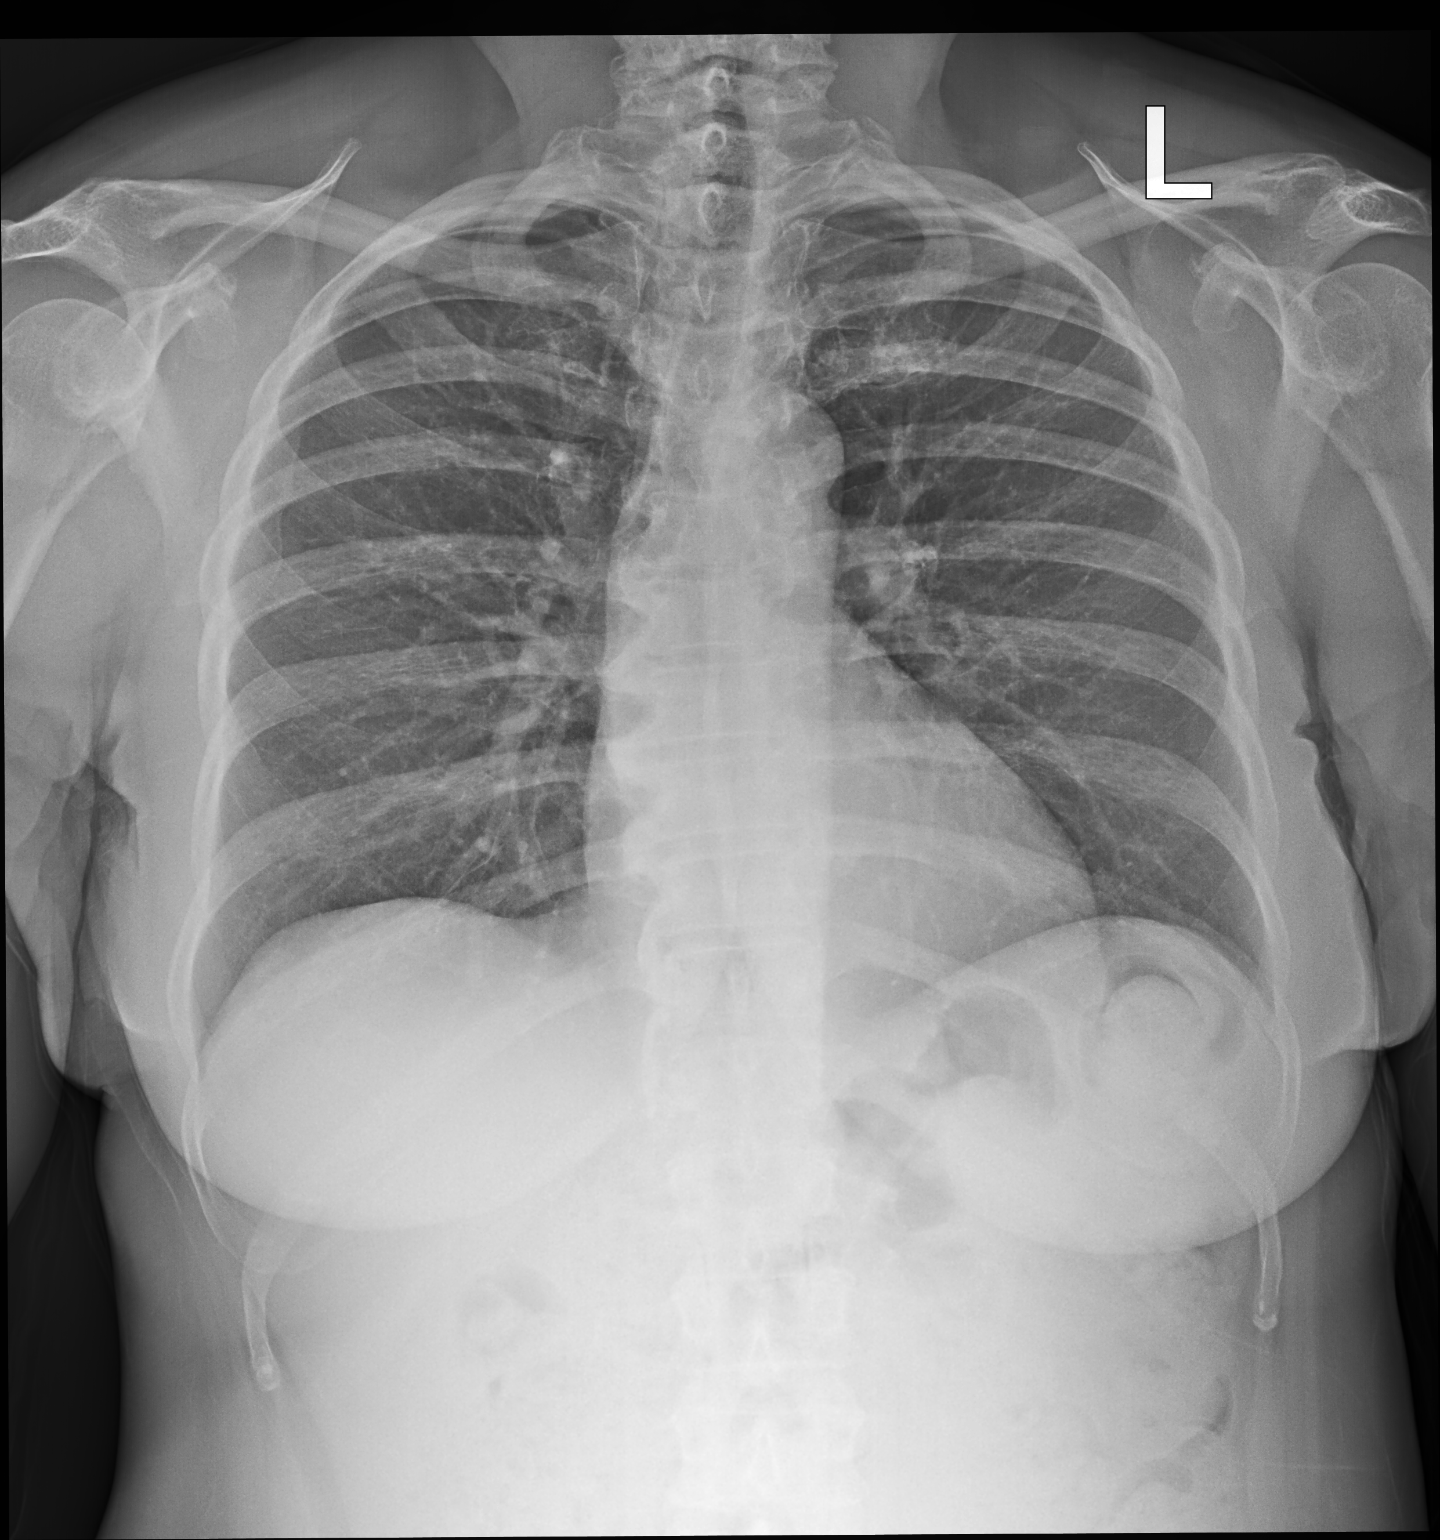

[chest lat]
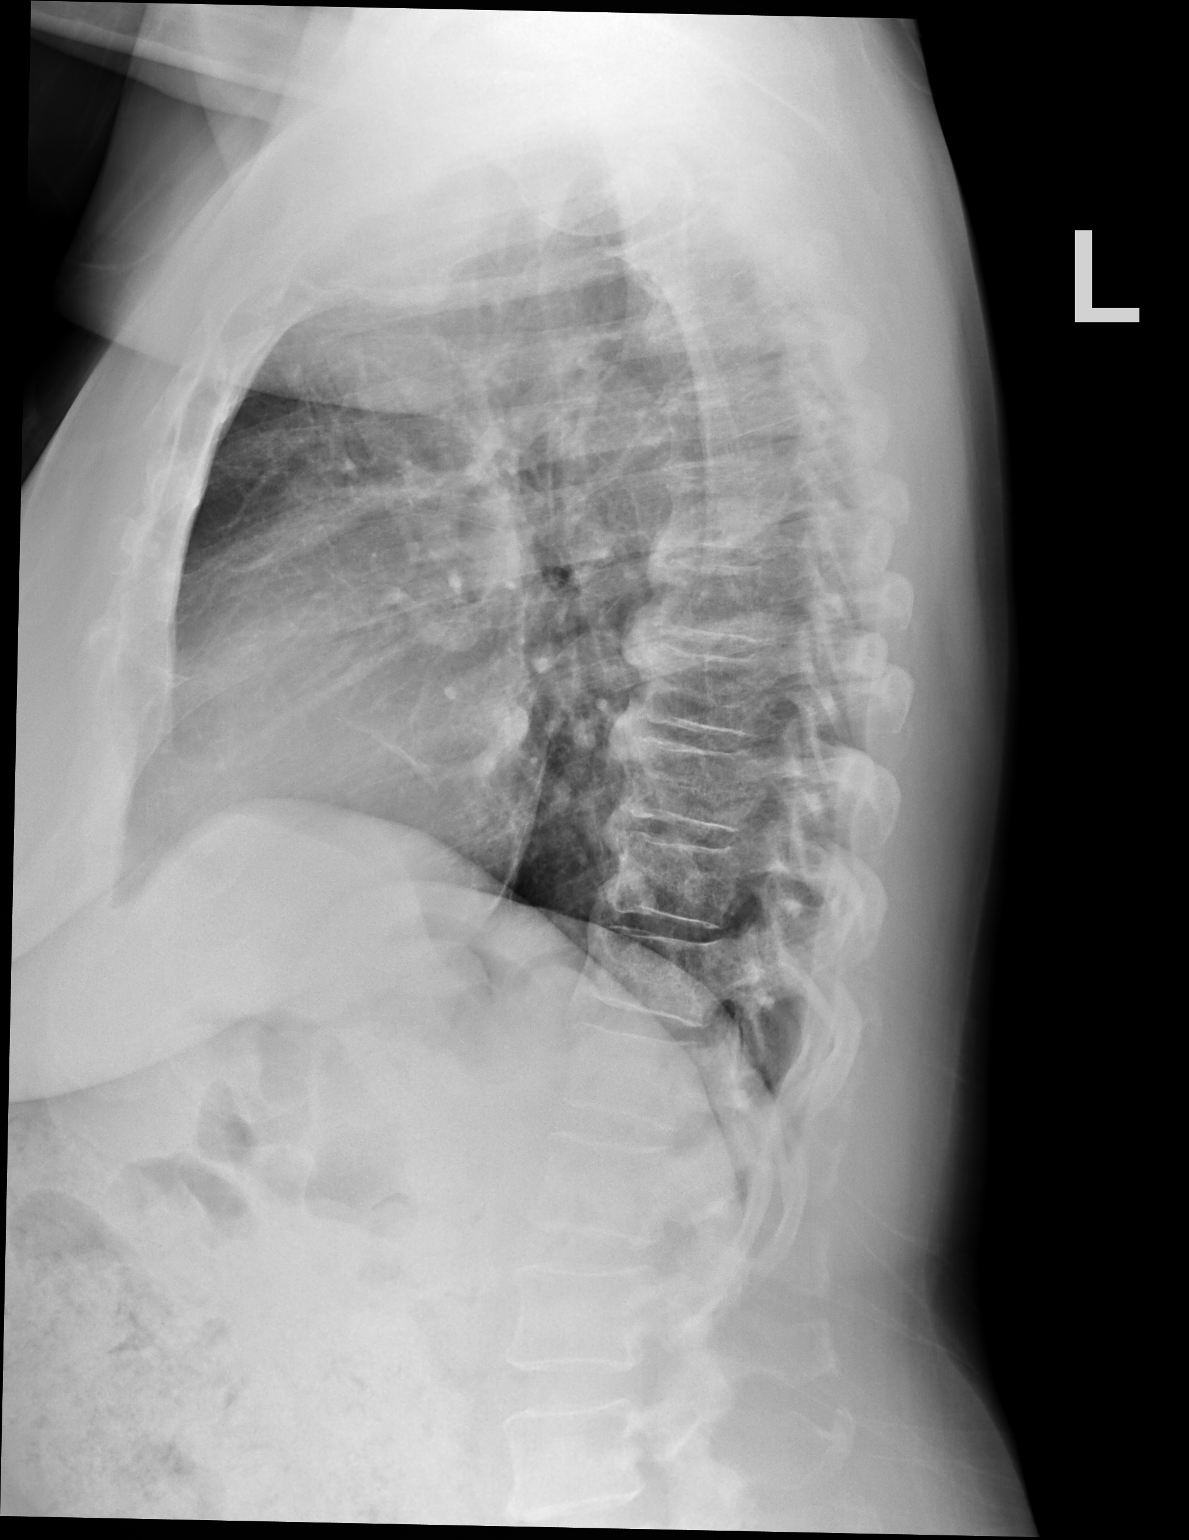

[2 of 2 positions shown; findings below may reference images not displayed]

FINDINGS: The heart size and mediastinal contours are within normal limits.
Both lungs are clear. The visualized skeletal structures are
unremarkable.
IMPRESSION: No active cardiopulmonary disease.

## 2022-01-24 ENCOUNTER — Encounter: Payer: Self-pay | Admitting: Internal Medicine

## 2022-01-24 ENCOUNTER — Ambulatory Visit (INDEPENDENT_AMBULATORY_CARE_PROVIDER_SITE_OTHER): Payer: Medicare Other | Admitting: Internal Medicine

## 2022-01-24 VITALS — BP 128/70 | HR 77 | Temp 97.9°F | Ht 61.0 in | Wt 159.4 lb

## 2022-01-24 DIAGNOSIS — A609 Anogenital herpesviral infection, unspecified: Secondary | ICD-10-CM | POA: Diagnosis not present

## 2022-01-24 DIAGNOSIS — N898 Other specified noninflammatory disorders of vagina: Secondary | ICD-10-CM | POA: Diagnosis not present

## 2022-01-24 DIAGNOSIS — R700 Elevated erythrocyte sedimentation rate: Secondary | ICD-10-CM | POA: Diagnosis not present

## 2022-01-24 DIAGNOSIS — R7303 Prediabetes: Secondary | ICD-10-CM | POA: Diagnosis not present

## 2022-01-24 DIAGNOSIS — T148XXA Other injury of unspecified body region, initial encounter: Secondary | ICD-10-CM | POA: Diagnosis not present

## 2022-01-24 DIAGNOSIS — I10 Essential (primary) hypertension: Secondary | ICD-10-CM | POA: Diagnosis not present

## 2022-01-24 LAB — TIQ-NTM

## 2022-01-24 NOTE — Progress Notes (Signed)
Chief Complaint  Patient presents with   Rectal Pain    Burning pain near rectum started 3 days ago   F/u  Burning Perineal pain in between vagina and rectum used oil of olay soap and zest body wash x 3 days had burning    Elevated esr 05/16/21 careeverywhere 49 disc etiology nonspecific could be arthritis noted in mid and lower back  Labs 07/20/21 care everywhere cmet, urine protein nl, creatinine 0.90 Gfr 70 improved f/u [facility] for ckd history    Review of Systems  Constitutional:  Negative for weight loss.  HENT:  Negative for hearing loss.   Eyes:  Negative for blurred vision.  Respiratory:  Negative for shortness of breath.   Cardiovascular:  Negative for chest pain.  Gastrointestinal:  Negative for abdominal pain and blood in stool.  Genitourinary:  Negative for dysuria.  Musculoskeletal:  Negative for falls and joint pain.  Skin:  Negative for rash.  Neurological:  Negative for headaches.  Psychiatric/Behavioral:  Negative for depression.    Past Medical History:  Diagnosis Date   Anemia    CKD (chronic kidney disease)    COVID-19    06/18/2021 [facility]   Diabetes mellitus without complication ([place])    Glaucoma    [facility] with macular edema and Dr. Rupert Stacks sees Q3-4 months   Hypertension    Past Surgical History:  Procedure Laterality Date   BIOPSY THYROID     CESAREAN SECTION     1981 1988   COLONOSCOPY WITH PROPOFOL N/A 01/26/2021   Procedure: COLONOSCOPY WITH PROPOFOL;  Surgeon: Virgel Manifold, MD;  Location: [facility];  Service: Endoscopy;  Laterality: N/A;   ESOPHAGOGASTRODUODENOSCOPY (EGD) WITH PROPOFOL N/A 01/26/2021   Procedure: ESOPHAGOGASTRODUODENOSCOPY (EGD) WITH PROPOFOL;  Surgeon: Virgel Manifold, MD;  Location: [facility] ENDOSCOPY;  Service: Endoscopy;  Laterality: N/A;   TUBAL LIGATION     Family History  Problem Relation Age of Onset   Diabetes Mother    Heart failure Mother    Diabetes Sister    Healthy Sister    Healthy  Sister    Healthy Sister    Diabetes Brother    Diabetes Sister    Heart failure Father    Brain cancer Brother    Throat cancer Brother    Glaucoma Other        Runs on Paternal Side   Breast cancer Cousin    Social History   Socioeconomic History   Marital status: Widowed    Spouse name: Not on file   Number of children: 2   Years of education: College   Highest education level: Not on file  Occupational History   Occupation: Retired 2014    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 [employer]  Tobacco Use   Smoking status: Never   Smokeless tobacco: Never  Vaping Use   Vaping Use: Never used  Substance and Sexual Activity   Alcohol use: No   Drug use: No   Sexual activity: Not Currently    Birth control/protection: None  Other Topics Concern   Not on file  Social History Narrative   Lives at home    Social Determinants of Health   Financial Resource Strain: Low Risk  (09/07/2020)   Overall Financial Resource Strain (CARDIA)    Difficulty of Paying Living Expenses: Not very hard  Food Insecurity: No Food Insecurity (07/27/2020)   Hunger Vital Sign    Worried About Running Out of Food in the Last Year: Never true  Ran Out of Food in the Last Year: Never true  Transportation Needs: No Transportation Needs (06/07/2019)   PRAPARE - Hydrologist (Medical): No    Lack of Transportation (Non-Medical): No  Physical Activity: Sufficiently Active (07/27/2020)   Exercise Vital Sign    Days of Exercise per Week: 7 days    Minutes of Exercise per Session: 30 min  Stress: No Stress Concern Present (06/07/2019)   [place]    Feeling of Stress : Not at all  Social Connections: Unknown (06/07/2019)   Social Connection and Isolation Panel [NHANES]    Frequency of Communication with Friends and Family: Patient refused    Frequency of Social Gatherings with Friends and Family: Patient refused     Attends Religious Services: Patient refused    Active Member of Clubs or Organizations: Patient refused    Attends Archivist Meetings: Patient refused    Marital Status: Patient refused  Intimate Partner Violence: Unknown (06/07/2019)   Humiliation, Afraid, Rape, and Kick questionnaire    Fear of Current or Ex-Partner: Patient refused    Emotionally Abused: Patient refused    Physically Abused: Patient refused    Sexually Abused: Patient refused   Current Meds  Medication Sig   aspirin EC 81 MG tablet Take 81 mg by mouth daily.   cloNIDine (CATAPRES) 0.1 MG tablet Take 1 tablet by mouth 2 (two) times daily as needed.   ELDERBERRY PO Take by mouth.   Ferrous Sulfate 27 MG TABS Take by mouth.   fluticasone (FLONASE) 50 MCG/ACT nasal spray Place 2 sprays into both nostrils daily. prn   furosemide (LASIX) 20 MG tablet Take 1 tablet (20 mg total) by mouth daily as needed for fluid or edema.   indapamide (LOZOL) 1.25 MG tablet Take 1.25 mg by mouth daily.   Inulin (FIBER CHOICE FRUITY BITES) 1.5 g CHEW Take as directed   magnesium oxide (MAG-OX) 400 (240 Mg) MG tablet Take 1 tablet by mouth daily.   metFORMIN (GLUCOPHAGE) 1000 MG tablet TAKE 1 TABLET(1000 MG) BY MOUTH DAILY   Multiple Vitamin (MULTIVITAMIN WITH MINERALS) TABS tablet Take 1 tablet by mouth daily.   olmesartan (BENICAR) 5 MG tablet TAKE 3 TABLETS BY MOUTH EVERY MORNING AND AT BEDTIME   Rectal Protectant-Emollient (CALMOL-4) 76-10 % SUPP Take as directed   Allergies  Allergen Reactions   Coreg [Carvedilol]     Could not tolerate     Hctz [Hydrochlorothiazide]     Low na 124    Januvia [Sitagliptin] Diarrhea   Promethazine Hives and Itching   Sulfa Antibiotics    Losartan Rash   No results found for this or any previous visit (from the past 2160 hour(s)). Objective  Body mass index is 30.12 kg/m. Wt Readings from Last 3 Encounters:  01/24/22 159 lb 6.4 oz (72.3 kg)  12/06/21 158 lb 6 oz (71.8 kg)   10/19/21 157 lb 6.4 oz (71.4 kg)   Temp Readings from Last 3 Encounters:  01/24/22 97.9 F (36.6 C) (Oral)  12/06/21 98.1 F (36.7 C) (Oral)  10/19/21 98.4 F (36.9 C) (Oral)   BP Readings from Last 3 Encounters:  01/24/22 128/70  12/06/21 135/81  10/19/21 134/82   Pulse Readings from Last 3 Encounters:  01/24/22 77  12/06/21 80  10/19/21 79    Physical Exam Vitals and nursing note reviewed.  Constitutional:      Appearance: Normal appearance. She is  well-developed and well-groomed.  HENT:     Head: Normocephalic and atraumatic.  Eyes:     Conjunctiva/sclera: Conjunctivae normal.     Pupils: Pupils are equal, round, and reactive to light.  Cardiovascular:     Rate and Rhythm: Normal rate and regular rhythm.     Heart sounds: Normal heart sounds. No murmur heard. Pulmonary:     Effort: Pulmonary effort is normal.     Breath sounds: Normal breath sounds.  Abdominal:     General: Abdomen is flat. Bowel sounds are normal.     Tenderness: There is no abdominal tenderness.  Genitourinary:    Pubic Area: No rash.      Labia:        Right: No rash.        Left: No rash.      Vagina: Lesions present.    Musculoskeletal:        General: No tenderness.  Skin:    General: Skin is warm and dry.  Neurological:     General: No focal deficit present.     Mental Status: She is alert and oriented to person, place, and time. Mental status is at baseline.     Cranial Nerves: Cranial nerves 2-12 are intact.     Gait: Gait is intact.  Psychiatric:        Attention and Perception: Attention and perception normal.        Mood and Affect: Mood and affect normal.        Speech: Speech normal.        Behavior: Behavior normal. Behavior is cooperative.        Thought Content: Thought content normal.        Cognition and Memory: Cognition and memory normal.        Judgment: Judgment normal.     Assessment  Plan  Vaginal irritation ?HSV - Plan: Herpes simplex virus  culture Blister - Plan: Herpes simplex virus culture HSV (herpes simplex virus) anogenital infection - Plan: Herpes simplex virus culture  Elevated sed rate nonspecific could be MSK mid and low back arthritis +- Plan: Sedimentation rate  Essential hypertension h/o CKD3a but gfr 07/2021 70 [facility] established- Plan: Comprehensive metabolic panel, Lipid panel, Hemoglobin A1c, CBC with Differential/Platelet Controlled clonidine 0.1 bid, lozol 1.25 qd, benicar 5 mg taking (3 pills bid) F[facility]u [facility]    Prediabetes - Plan: Hemoglobin A1c   HM Flu shot declines  prevnar utd 03/12/18 pna 23 utd 05/19/13  Consider prevnar 20 in the future  Tdap utd 02/07/12 repeat due at f/u 02/2022   Consider shingrix vaccines in future  covid 19 4/4 moderna had covid 06/20/21+    Eye MD seen Dr Chanetta Marshall, [place] last seen 11/30/21 borderline glaucoma appt sch 04/30/22  Retina MD 04/11/20  Glaucoma suspect    Pap 05/13/19 negative neg HPV    mammo 08/09/20 neg ordered, 10/17/21 negative   Colonoscopy had in 09/30/2011 per pt she was ~61 per chart review was normal though I am unable to review report pt states she had at [facility] -need to get copy of report due 09/29/21   sch 07/27/21 Dr. Marius Ditch egd  Had colonoscopy 01/26/21    dexa 08/09/20 negative Never smoker but exposed to 2nd hand via husband   09/09/19 renal US duplex normal     09/19/15 thyroid US with 10/04/15 benign bx  IMPRESSION: 1. Thyromegaly with bilateral small nodules. Dominant right lesion meets consensus criteria for biopsy. Ultrasound-guided  fine needle aspiration should be considered, as per the consensus statement: Management of Thyroid Nodules Detected at [place]: Society of Radiologists in [place]. Radiology 2005; 720:947-096   leb Gi Dr Rhea Belton     Echo 09/09/19  FINDINGS   Left Ventricle: Left ventricular ejection fraction, by estimation, is 55  to 60%. Left ventricular ejection  fraction by PLAX is 57 % The left  ventricle has normal function. The left ventricle has no regional wall  motion abnormalities. The left  ventricular internal cavity size was normal in size. There is no left  ventricular hypertrophy. Left ventricular diastolic parameters were  normal.   Right Ventricle: The right ventricular size is normal. No increase in  right ventricular wall thickness. Right ventricular systolic function is  normal.   Left Atrium: Left atrial size was normal in size.   Right Atrium: Right atrial size was normal in size.   Pericardium: There is no evidence of pericardial effusion.   Mitral Valve: The mitral valve is grossly normal. Trivial mitral valve  regurgitation. MV peak gradient, 8.9 mmHg. The mean mitral valve gradient  is 5.0 mmHg.   Tricuspid Valve: The tricuspid valve is grossly normal. Tricuspid valve  regurgitation is mild.   Aortic Valve: The aortic valve was not well visualized. Aortic valve  regurgitation is not visualized. No aortic stenosis is present. Aortic  valve mean gradient measures 6.0 mmHg. Aortic valve peak gradient measures  11.4 mmHg. Aortic valve area, by VTI  measures 1.75 cm.   Pulmonic Valve: The pulmonic valve was not well visualized. Pulmonic valve  regurgitation is not visualized.   Aorta: The aortic root was not well visualized and the aortic root is  normal in size and structure.   IAS/Shunts: The atrial septum is grossly normal.    [place] 09/07/20  IMPRESSION: No sonographic finding to explain the patient's abdominal pain.     Electronically Signed   By: Audie Pinto M.D.   On: 09/07/2020 13:53     VAS [place] lower ext venous reflux 11/22/19 Summary:  Bilateral:  - No evidence of deep vein thrombosis seen in the lower extremities,  bilaterally, from the common femoral through the popliteal veins.     - No evidence of superficial venous thrombosis in the lower extremities,  bilaterally.      - No evidence  of deep venous insufficiency seen bilaterally in the lower  extremity.     - No evidence of superficial venous reflux seen in the short saphenous  veins bilaterally.     Right:  - No evidence of superficial venous reflux seen in the right greater  saphenous vein.      Left:  - Venous reflux is noted in the left sapheno-femoral junction.     *See table(s) above for measurements and observations.   Electronically signed by Leotis Pain MD on 11/23/2019 at 11:11:44 AM.    06/08/21  MR cervical IMPRESSION: 1. Generalized disc bulging and facet spurring causing mild wasting of the cord at C3-4 and C4-5. 2. No foraminal impingement or visible inflammation.     Electronically Signed   By: Jorje Guild M.D.   On: 06/09/2021 08:30 Provider: Dr. Olivia Mackie [name]-Internal Medicine

## 2022-01-24 NOTE — Patient Instructions (Addendum)
Summers eve or vagisil unscented  Or  Bar soap unscented Dove   Use vasoline in the area with Qtip    Stop current soaps and use above

## 2022-01-25 ENCOUNTER — Telehealth: Payer: Self-pay | Admitting: *Deleted

## 2022-01-25 ENCOUNTER — Telehealth: Payer: Self-pay | Admitting: Internal Medicine

## 2022-01-25 NOTE — Telephone Encounter (Signed)
Called [facility] spoke to Maybee in Clinical biochemist. They were unable to run the Herpes Virus test because sample was not sent in [place].

## 2022-01-25 NOTE — Addendum Note (Signed)
Addended by: Quentin Ore on: 01/25/2022 09:43 AM   Modules accepted: Orders

## 2022-01-25 NOTE — Telephone Encounter (Signed)
Patient scheduled here for labs and voiced understanding that to previous test.

## 2022-01-25 NOTE — Telephone Encounter (Signed)
Noted  

## 2022-01-29 ENCOUNTER — Other Ambulatory Visit (INDEPENDENT_AMBULATORY_CARE_PROVIDER_SITE_OTHER): Payer: Medicare Other

## 2022-01-29 DIAGNOSIS — R7303 Prediabetes: Secondary | ICD-10-CM

## 2022-01-29 DIAGNOSIS — A609 Anogenital herpesviral infection, unspecified: Secondary | ICD-10-CM

## 2022-01-29 DIAGNOSIS — I10 Essential (primary) hypertension: Secondary | ICD-10-CM

## 2022-01-29 DIAGNOSIS — T148XXA Other injury of unspecified body region, initial encounter: Secondary | ICD-10-CM | POA: Diagnosis not present

## 2022-01-29 DIAGNOSIS — R700 Elevated erythrocyte sedimentation rate: Secondary | ICD-10-CM

## 2022-01-29 DIAGNOSIS — N898 Other specified noninflammatory disorders of vagina: Secondary | ICD-10-CM | POA: Diagnosis not present

## 2022-01-29 LAB — HEMOGLOBIN A1C: Hgb A1c MFr Bld: 7 % — ABNORMAL HIGH (ref 4.6–6.5)

## 2022-01-29 LAB — HERPES SIMPLEX VIRUS CULTURE

## 2022-01-29 LAB — COMPREHENSIVE METABOLIC PANEL
ALT: 10 U/L (ref 0–35)
AST: 18 U/L (ref 0–37)
Albumin: 4.4 g/dL (ref 3.5–5.2)
Alkaline Phosphatase: 102 U/L (ref 39–117)
BUN: 22 mg/dL (ref 6–23)
CO2: 29 mEq/L (ref 19–32)
Calcium: 10.3 mg/dL (ref 8.4–10.5)
Chloride: 100 mEq/L (ref 96–112)
Creatinine, Ser: 1.18 mg/dL (ref 0.40–1.20)
GFR: 47.17 mL/min — ABNORMAL LOW (ref >60.00)
Glucose, Bld: 146 mg/dL — ABNORMAL HIGH (ref 70–99)
Potassium: 4.4 mEq/L (ref 3.5–5.1)
Sodium: 136 mEq/L (ref 135–145)
Total Bilirubin: 0.4 mg/dL (ref 0.2–1.2)
Total Protein: 7.5 g/dL (ref 6.0–8.3)

## 2022-01-29 LAB — CBC WITH DIFFERENTIAL/PLATELET
Basophils Absolute: 0 10*3/uL (ref 0.0–0.1)
Basophils Relative: 0.6 % (ref 0.0–3.0)
Eosinophils Absolute: 0.1 10*3/uL (ref 0.0–0.7)
Eosinophils Relative: 2 % (ref 0.0–5.0)
HCT: 33.5 % — ABNORMAL LOW (ref 36.0–46.0)
Hemoglobin: 11.3 g/dL — ABNORMAL LOW (ref 12.0–15.0)
Lymphocytes Relative: 37.3 % (ref 12.0–46.0)
Lymphs Abs: 2 10*3/uL (ref 0.7–4.0)
MCHC: 33.6 g/dL (ref 30.0–36.0)
MCV: 88.5 fl (ref 78.0–100.0)
Monocytes Absolute: 0.4 10*3/uL (ref 0.1–1.0)
Monocytes Relative: 6.5 % (ref 3.0–12.0)
Neutro Abs: 2.9 10*3/uL (ref 1.4–7.7)
Neutrophils Relative %: 53.6 % (ref 43.0–77.0)
Platelets: 301 10*3/uL (ref 150.0–400.0)
RBC: 3.79 Mil/uL — ABNORMAL LOW (ref 3.87–5.11)
RDW: 12.7 % (ref 11.5–15.5)
WBC: 5.5 10*3/uL (ref 4.0–10.5)

## 2022-01-29 LAB — LIPID PANEL
Cholesterol: 122 mg/dL (ref 0–200)
HDL: 47.6 mg/dL (ref >39.00)
LDL Cholesterol: 64 mg/dL (ref 0–99)
NonHDL: 74.13
Total CHOL/HDL Ratio: 3
Triglycerides: 52 mg/dL (ref 0.0–149.0)
VLDL: 10.4 mg/dL (ref 0.0–40.0)

## 2022-01-29 LAB — SEDIMENTATION RATE: Sed Rate: 36 mm/hr — ABNORMAL HIGH (ref 0–30)

## 2022-01-30 LAB — HSV(HERPES SIMPLEX VRS) I + II AB-IGG
HAV 1 IGG,TYPE SPECIFIC AB: 46.5 index — ABNORMAL HIGH
HSV 2 IGG,TYPE SPECIFIC AB: 3.8 index — ABNORMAL HIGH

## 2022-01-31 ENCOUNTER — Other Ambulatory Visit: Payer: Self-pay | Admitting: Internal Medicine

## 2022-01-31 DIAGNOSIS — B009 Herpesviral infection, unspecified: Secondary | ICD-10-CM

## 2022-01-31 MED ORDER — VALACYCLOVIR HCL 1 G PO TABS: 1000.0000 mg | ORAL_TABLET | Freq: Two times a day (BID) | ORAL | 0 refills | Status: DC

## 2022-02-21 ENCOUNTER — Telehealth: Payer: Self-pay

## 2022-02-21 NOTE — Telephone Encounter (Signed)
Patient has requested to reschedule her colonoscopy  due to other health issues.  Colonoscopy has been rescheduled to 06/10/22.  Raynelle Fanning in Endo has been made aware of date change.  Thanks, [place], [place]

## 2022-03-07 ENCOUNTER — Telehealth: Payer: Self-pay | Admitting: Internal Medicine

## 2022-03-07 NOTE — Telephone Encounter (Signed)
Copied from CRM [id number]. Topic: Medicare AWV >> Mar 07, 2022 10:38 AM Payton Doughty wrote: Reason for CRM: Left message for patient to schedule Annual Wellness Visit.  Please schedule with Nurse Health Advisor Denisa O'Brien-Blaney, LPN at [facility]. This appt can be telephone or office visit.  Please call [phone] ask for [facility]

## 2022-03-14 ENCOUNTER — Telehealth: Payer: Self-pay | Admitting: Internal Medicine

## 2022-03-14 NOTE — Telephone Encounter (Signed)
Spoke with patient she req CB 03/18/22 

## 2022-03-21 ENCOUNTER — Ambulatory Visit (INDEPENDENT_AMBULATORY_CARE_PROVIDER_SITE_OTHER): Payer: Medicare Other

## 2022-03-21 ENCOUNTER — Encounter: Payer: Self-pay | Admitting: Internal Medicine

## 2022-03-21 ENCOUNTER — Ambulatory Visit (INDEPENDENT_AMBULATORY_CARE_PROVIDER_SITE_OTHER): Payer: Medicare Other | Admitting: Internal Medicine

## 2022-03-21 ENCOUNTER — Ambulatory Visit: Payer: Medicare Other

## 2022-03-21 VITALS — BP 138/82 | HR 67 | Temp 98.3°F | Ht 61.0 in | Wt 157.6 lb

## 2022-03-21 DIAGNOSIS — E1159 Type 2 diabetes mellitus with other circulatory complications: Secondary | ICD-10-CM

## 2022-03-21 DIAGNOSIS — M7989 Other specified soft tissue disorders: Secondary | ICD-10-CM

## 2022-03-21 DIAGNOSIS — I10 Essential (primary) hypertension: Secondary | ICD-10-CM | POA: Diagnosis not present

## 2022-03-21 DIAGNOSIS — N1831 Chronic kidney disease, stage 3a: Secondary | ICD-10-CM

## 2022-03-21 NOTE — Progress Notes (Signed)
Chief Complaint  Patient presents with   Edema    Pt has been having her index fingers and small toe on left foot swell   F/u  1. B/l index fingers c/o swelling x 2 weeks w/u much pain but trouble bending fingers she has been using hands more bathing a friend but nothing else. Nothing tried but she looked online and is worried about infection in her fingers I disc less likely and could be arthritis     Review of Systems  Constitutional:  Negative for weight loss.  HENT:  Negative for hearing loss.   Eyes:  Negative for blurred vision.  Respiratory:  Negative for shortness of breath.   Cardiovascular:  Negative for chest pain.  Gastrointestinal:  Negative for abdominal pain and blood in stool.  Genitourinary:  Negative for dysuria.  Musculoskeletal:  Negative for falls and joint pain.  Skin:  Negative for rash.  Neurological:  Negative for headaches.  Psychiatric/Behavioral:  Negative for depression.    Past Medical History:  Diagnosis Date   Anemia    CKD (chronic kidney disease)    COVID-19    06/18/2021 [facility]   Diabetes mellitus without complication (HCC)    Glaucoma    [facility] with macular edema and Dr. Frederico Hamman sees Q3-4 months   Hypertension    Past Surgical History:  Procedure Laterality Date   BIOPSY THYROID     CESAREAN SECTION     1981 1988   COLONOSCOPY WITH PROPOFOL N/A 01/26/2021   Procedure: COLONOSCOPY WITH PROPOFOL;  Surgeon: [name], MD;  Location: [facility] END[facility];  Service: Endoscopy;  Laterality: N/A;   ESOPHAGOGASTRODUODENOSCOPY (EGD) WITH PROPOFOL N/A 01/26/2021   Procedure: ESOPHAGOGASTRODUODENOSCOPY (EGD) WITH PROPOFOL;  Surgeon: [name], MD;  Location: [facility] ENDOSCOPY;  Service: Endoscopy;  Laterality: N/A;   TUBAL LIGATION     Family History  Problem Relation Age of Onset   Diabetes Mother    Heart failure Mother    Diabetes Sister    Healthy Sister    Healthy Sister    Healthy Sister    Diabetes Brother     Diabetes Sister    Heart failure Father    Brain cancer Brother    Throat cancer Brother    Glaucoma Other        Runs on Paternal Side   Breast cancer Cousin    Social History   Socioeconomic History   Marital status: Widowed    Spouse name: Not on file   Number of children: 2   Years of education: College   Highest education level: Not on file  Occupational History   Occupation: Retired 2014    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  Tobacco Use   Smoking status: Never   Smokeless tobacco: Never  Vaping Use   Vaping Use: Never used  Substance and Sexual Activity   Alcohol use: No   Drug use: No   Sexual activity: Not Currently    Birth control/protection: None  Other Topics Concern   Not on file  Social History Narrative   Lives at home    Social Determinants of Health   Financial Resource Strain: Low Risk  (09/07/2020)   Overall Financial Resource Strain (CARDIA)    Difficulty of Paying Living Expenses: Not very hard  Food Insecurity: No Food Insecurity (07/27/2020)   Hunger Vital Sign    Worried About Running Out of Food in the Last Year: Never true    Ran Out of Food  in the Last Year: Never true  Transportation Needs: No Transportation Needs (06/07/2019)   PRAPARE - Administrator, Civil Service (Medical): No    Lack of Transportation (Non-Medical): No  Physical Activity: Sufficiently Active (07/27/2020)   Exercise Vital Sign    Days of Exercise per Week: 7 days    Minutes of Exercise per Session: 30 min  Stress: No Stress Concern Present (06/07/2019)   [employer] of Occupational Health - Occupational Stress Questionnaire    Feeling of Stress : Not at all  Social Connections: Unknown (06/07/2019)   Social Connection and Isolation Panel [NHANES]    Frequency of Communication with Friends and Family: Patient refused    Frequency of Social Gatherings with Friends and Family: Patient refused    Attends Religious Services: Patient refused     Active Member of Clubs or Organizations: Patient refused    Attends Banker Meetings: Patient refused    Marital Status: Patient refused  Intimate Partner Violence: Unknown (06/07/2019)   Humiliation, Afraid, Rape, and Kick questionnaire    Fear of Current or Ex-Partner: Patient refused    Emotionally Abused: Patient refused    Physically Abused: Patient refused    Sexually Abused: Patient refused   Current Meds  Medication Sig   aspirin EC 81 MG tablet Take 81 mg by mouth daily.   ELDERBERRY PO Take by mouth.   Ferrous Sulfate 27 MG TABS Take by mouth.   fluticasone (FLONASE) 50 MCG/ACT nasal spray Place 2 sprays into both nostrils daily. prn   furosemide (LASIX) 20 MG tablet Take 1 tablet (20 mg total) by mouth daily as needed for fluid or edema.   indapamide (LOZOL) 1.25 MG tablet Take 1.25 mg by mouth daily.   Inulin (FIBER CHOICE FRUITY BITES) 1.5 g CHEW Take as directed   loratadine (CLARITIN) 10 MG tablet Take 10 mg by mouth daily as needed for allergies.   magnesium oxide (MAG-OX) 400 (240 Mg) MG tablet Take 1 tablet by mouth daily.   metFORMIN (GLUCOPHAGE) 1000 MG tablet TAKE 1 TABLET(1000 MG) BY MOUTH DAILY   Multiple Vitamin (MULTIVITAMIN WITH MINERALS) TABS tablet Take 1 tablet by mouth daily.   olmesartan (BENICAR) 5 MG tablet TAKE 3 TABLETS BY MOUTH EVERY MORNING AND AT BEDTIME   Rectal Protectant-Emollient (CALMOL-4) 76-10 % SUPP Take as directed   valACYclovir (VALTREX) 1000 MG tablet Take 1 tablet (1,000 mg total) by mouth 2 (two) times daily. X 5-7 days with food as needed outbreak   [DISCONTINUED] cloNIDine (CATAPRES) 0.1 MG tablet Take 1 tablet by mouth 2 (two) times daily as needed.   Allergies  Allergen Reactions   Coreg [Carvedilol]     Could not tolerate     Hctz [Hydrochlorothiazide]     Low na 124    Januvia [Sitagliptin] Diarrhea   Promethazine Hives and Itching   Sulfa Antibiotics    Losartan Rash   Recent Results (from the past 2160  hour(s))  Herpes simplex virus culture     Status: None   Collection Time: 01/24/22  4:04 PM   Specimen: Other  Result Value Ref Range   MICRO NUMBER: CANCELED     Comment: Result canceled by the ancillary.   SPECIMEN QUALITY: CANCELED     Comment: Result canceled by the ancillary.   Source CANCELED     Comment: Result canceled by the ancillary.   STATUS: CANCELED     Comment: Result canceled by the ancillary.   HSV  CULTURE: CANCELED     Comment: Result canceled by the ancillary.  TIQ-NTM     Status: None   Collection Time: 01/24/22  4:04 PM  Result Value Ref Range   QUESTION/PROBLEM:      Comment: . The specimen submitted is not appropriate for the test(s) ordered. Marland Kitchen    SPECIMEN(S) RECEIVED: Specimen VM unneeded     Comment: REQUESTED INFORMATION _________________________________ . AUTHORIZED SIGNATURE __________________________________ . TO PREVENT FURTHER DELAYS IN TESTING, PLEASE COMPLETE INFORMATION ABOVE AND EITHER FAX TO [phone] OR  EMAIL TO [email or web address] TO  RESOLVE THIS ORDER.   Sedimentation rate     Status: Abnormal   Collection Time: 01/29/22 10:09 AM  Result Value Ref Range   Sed Rate 36 (H) 0 - 30 mm/hr  CBC with Differential/Platelet     Status: Abnormal   Collection Time: 01/29/22 10:09 AM  Result Value Ref Range   WBC 5.5 4.0 - 10.5 K/uL   RBC 3.79 (L) 3.87 - 5.11 Mil/uL   Hemoglobin 11.3 (L) 12.0 - 15.0 g/dL   HCT 47.8 (L) 29.5 - 62.1 %   MCV 88.5 78.0 - 100.0 fl   MCHC 33.6 30.0 - 36.0 g/dL   RDW 30.8 65.7 - 84.6 %   Platelets 301.0 150.0 - 400.0 K/uL   Neutrophils Relative % 53.6 43.0 - 77.0 %   Lymphocytes Relative 37.3 12.0 - 46.0 %   Monocytes Relative 6.5 3.0 - 12.0 %   Eosinophils Relative 2.0 0.0 - 5.0 %   Basophils Relative 0.6 0.0 - 3.0 %   Neutro Abs 2.9 1.4 - 7.7 K/uL   Lymphs Abs 2.0 0.7 - 4.0 K/uL   Monocytes Absolute 0.4 0.1 - 1.0 K/uL   Eosinophils Absolute 0.1 0.0 - 0.7 K/uL   Basophils Absolute  0.0 0.0 - 0.1 K/uL  Hemoglobin A1c     Status: Abnormal   Collection Time: 01/29/22 10:09 AM  Result Value Ref Range   Hgb A1c MFr Bld 7.0 (H) 4.6 - 6.5 %    Comment: Glycemic Control Guidelines for People with Diabetes:Non Diabetic:  <6%Goal of Therapy: <7%Additional Action Suggested:  >8%   Lipid panel     Status: None   Collection Time: 01/29/22 10:09 AM  Result Value Ref Range   Cholesterol 122 0 - 200 mg/dL    Comment: ATP III Classification       Desirable:  < 200 mg/dL               Borderline High:  200 - 239 mg/dL          High:  > = 962 mg/dL   Triglycerides 95.2 0.0 - 149.0 mg/dL    Comment: Normal:  <841 mg/dLBorderline High:  150 - 199 mg/dL   HDL 32.44 >01.02 mg/dL   VLDL 72.5 0.0 - 36.6 mg/dL   LDL Cholesterol 64 0 - 99 mg/dL   Total CHOL/HDL Ratio 3     Comment:                Men          Women1/2 Average Risk     3.4          3.3Average Risk          5.0          4.42X Average Risk          9.6          7.13X Average Risk  15.0          11.0                       NonHDL 74.13     Comment: NOTE:  Non-HDL goal should be 30 mg/dL higher than patient's LDL goal (i.e. LDL goal of < 70 mg/dL, would have non-HDL goal of < 100 mg/dL)  Comprehensive metabolic panel     Status: Abnormal   Collection Time: 01/29/22 10:09 AM  Result Value Ref Range   Sodium 136 135 - 145 mEq/L   Potassium 4.4 3.5 - 5.1 mEq/L   Chloride 100 96 - 112 mEq/L   CO2 29 19 - 32 mEq/L   Glucose, Bld 146 (H) 70 - 99 mg/dL   BUN 22 6 - 23 mg/dL   Creatinine, Ser 3.26 0.40 - 1.20 mg/dL   Total Bilirubin 0.4 0.2 - 1.2 mg/dL   Alkaline Phosphatase 102 39 - 117 U/L   AST 18 0 - 37 U/L   ALT 10 0 - 35 U/L   Total Protein 7.5 6.0 - 8.3 g/dL   Albumin 4.4 3.5 - 5.2 g/dL   GFR 71.24 (L) >58.09 mL/min    Comment: Calculated using the CKD-EPI Creatinine Equation (2021)   Calcium 10.3 8.4 - 10.5 mg/dL  HSV(herpes simplex vrs) 1+2 ab-IgG     Status: Abnormal   Collection Time: 01/29/22 10:23 AM   Result Value Ref Range   HAV 1 IGG,TYPE SPECIFIC AB 46.50 (H) index   HSV 2 IGG,TYPE SPECIFIC AB 3.80 (H) index    Comment:                           Index          Interpretation                           -----          --------------                           <0.90          Negative                           0.90-1.09      Equivocal                           >1.09          Positive . This assay utilizes recombinant type-specific antigens to differentiate HSV-1 from HSV-2 infections. A positive result cannot distinguish between recent and past infection. If recent HSV infection is suspected but the results are negative or equivocal, the assay should be repeated in 4-6 weeks. The performance characteristics of the assay have not been established for pediatric populations, immunocompromised patients, or neonatal screening. . . For additional information, please refer to  http://education.[facility].com/faq/FAQ118  (This link is being provided for informational/ educational purposes only.) .    Objective  Body mass index is 29.78 kg/m. Wt Readings from Last 3 Encounters:  03/21/22 157 lb 9.6 oz (71.5 kg)  01/24/22 159 lb 6.4 oz (72.3 kg)  12/06/21 158 lb 6 oz (71.8 kg)   Temp Readings from Last 3 Encounters:  03/21/22 98.3 F (36.8 C) (Oral)  01/24/22 97.9 F (  36.6 C) (Oral)  12/06/21 98.1 F (36.7 C) (Oral)   BP Readings from Last 3 Encounters:  03/21/22 138/82  01/24/22 128/70  12/06/21 135/81   Pulse Readings from Last 3 Encounters:  03/21/22 67  01/24/22 77  12/06/21 80    Physical Exam Vitals and nursing note reviewed.  Constitutional:      Appearance: Normal appearance. She is well-developed and well-groomed.  HENT:     Head: Normocephalic and atraumatic.  Eyes:     Conjunctiva/sclera: Conjunctivae normal.     Pupils: Pupils are equal, round, and reactive to light.  Cardiovascular:     Rate and Rhythm: Normal rate and regular rhythm.      Heart sounds: Normal heart sounds. No murmur heard. Pulmonary:     Effort: Pulmonary effort is normal.     Breath sounds: Normal breath sounds.  Abdominal:     General: Abdomen is flat. Bowel sounds are normal.     Tenderness: There is no abdominal tenderness.  Musculoskeletal:        General: No tenderness.  Skin:    General: Skin is warm and dry.  Neurological:     General: No focal deficit present.     Mental Status: She is alert and oriented to person, place, and time. Mental status is at baseline.     Cranial Nerves: Cranial nerves 2-12 are intact.     Motor: Motor function is intact.     Coordination: Coordination is intact.     Gait: Gait is intact.  Psychiatric:        Attention and Perception: Attention and perception normal.        Mood and Affect: Mood and affect normal.        Speech: Speech normal.        Behavior: Behavior normal. Behavior is cooperative.        Thought Content: Thought content normal.        Cognition and Memory: Cognition and memory normal.        Judgment: Judgment normal.     Assessment  Plan  Swelling of right index finger likely arthritis less likely infection or trigger finger not  painful for pt- Plan: DG Hand Complete Right Swelling of left index finger  Consider tylenol, arthritis gloves, voltaren gel ortho if painful in the future  HTN with ckd 3a/b with DM 2 7.0 a1c 01/29/22  F/u renal unc  On lozol 1.25 mg qd  Benicar 15 bid  On metformin 1000 qd  Consider statin in the future Low salt diet   HM See last visit declines flu shot today  Flu shot declines  prevnar utd 03/12/18 pna 23 utd 05/19/13  Consider prevnar 20 in the future 03/13/23 Tdap utd 02/07/12 repeat due at f/u 02/2022    Consider shingrix vaccines in future  covid 19 4/4 moderna had covid 06/20/21+    Eye MD seen Dr Rosalene Billings, [place] last seen 11/30/21 borderline glaucoma appt sch 04/30/22  Retina MD 04/11/20  Glaucoma suspect    Pap 05/13/19 negative  neg HPV    mammo 08/09/20 neg ordered, 10/17/21 negative   Colonoscopy had in 09/30/2011 per pt she was ~61 per chart review was normal though I am unable to review report pt states she [facility] -need to get copy of report due 09/29/21   sch 07/27/21 Dr. Allegra Lai egd  Had colonoscopy 01/26/21 due in 1 year     dexa 08/09/20 negative Never smoker but exposed  to 2nd hand via husband   09/09/19 renal US duplex normal     09/19/15 thyroid US with 10/04/15 benign bx  IMPRESSION: 1. Thyromegaly with bilateral small nodules. Dominant right lesion meets consensus criteria for biopsy. Ultrasound-guided fine needle aspiration should be considered, as per the consensus statement: Management of Thyroid Nodules Detected at US: Society of Radiologists in Ultrasound Consensus Conference Statement. Radiology 2005; [id number]   leb Gi Dr Alonza BogusArumbruster/vanga     Echo 09/09/19  FINDINGS   Left Ventricle: Left ventricular ejection fraction, by estimation, is 55  to 60%. Left ventricular ejection fraction by PLAX is 57 % The left  ventricle has normal function. The left ventricle has no regional wall  motion abnormalities. The left  ventricular internal cavity size was normal in size. There is no left  ventricular hypertrophy. Left ventricular diastolic parameters were  normal.   Right Ventricle: The right ventricular size is normal. No increase in  right ventricular wall thickness. Right ventricular systolic function is  normal.   Left Atrium: Left atrial size was normal in size.   Right Atrium: Right atrial size was normal in size.   Pericardium: There is no evidence of pericardial effusion.   Mitral Valve: The mitral valve is grossly normal. Trivial mitral valve  regurgitation. MV peak gradient, 8.9 mmHg. The mean mitral valve gradient  is 5.0 mmHg.   Tricuspid Valve: The tricuspid valve is grossly normal. Tricuspid valve  regurgitation is mild.   Aortic Valve: The aortic valve was not well  visualized. Aortic valve  regurgitation is not visualized. No aortic stenosis is present. Aortic  valve mean gradient measures 6.0 mmHg. Aortic valve peak gradient measures  11.4 mmHg. Aortic valve area, by VTI  measures 1.75 cm.   Pulmonic Valve: The pulmonic valve was not well visualized. Pulmonic valve  regurgitation is not visualized.   Aorta: The aortic root was not well visualized and the aortic root is  normal in size and structure.   IAS/Shunts: The atrial septum is grossly normal.    US 09/07/20  IMPRESSION: No sonographic finding to explain the patient's abdominal pain.     Electronically Signed   By: Emmaline KluverNancy  Ballantyne M.D.   On: 09/07/2020 13:53     VAS US lower ext venous reflux 11/22/19 Summary:  Bilateral:  - No evidence of deep vein thrombosis seen in the lower extremities,  bilaterally, from the common femoral through the popliteal veins.     - No evidence of superficial venous thrombosis in the lower extremities,  bilaterally.      - No evidence of deep venous insufficiency seen bilaterally in the lower  extremity.     - No evidence of superficial venous reflux seen in the short saphenous  veins bilaterally.     Right:  - No evidence of superficial venous reflux seen in the right greater  saphenous vein.      Left:  - Venous reflux is noted in the left sapheno-femoral junction.     *See table(s) above for measurements and observations.   Electronically signed by Festus BarrenJason Dew MD on 11/23/2019 at 11:11:44 AM.    06/08/21  MR cervical IMPRESSION: 1. Generalized disc bulging and facet spurring causing mild wasting of the cord at C3-4 and C4-5. 2. No foraminal impingement or visible inflammation.     Electronically Signed   By: Tiburcio PeaJonathan  Watts M.D.   On: 06/09/2021 08:30 Provider: Dr. French Anaracy [name]

## 2022-03-21 NOTE — Patient Instructions (Addendum)
Prevnar 20 vaccine due 03/13/23   Tumeric capsules brand [employer] Made or [employer]   Toe cushion or pad left 5th toe   Pneumococcal Conjugate Vaccine (Prevnar 20) Suspension for Injection What is this medication? PNEUMOCOCCAL VACCINE (NEU mo KOK al vak SEEN) is a vaccine. It prevents pneumococcus bacterial infections. These bacteria can cause serious infections like pneumonia, meningitis, and blood infections. This vaccine will not treat an infection and will not cause infection. This vaccine is recommended for adults 18 years and older. This medicine may be used for other purposes; ask your health care provider or pharmacist if you have questions. COMMON BRAND NAME(S): Prevnar 20 What should I tell my care team before I take this medication? They need to know if you have any of these conditions: bleeding disorder fever immune system problems an unusual or allergic reaction to pneumococcal vaccine, diphtheria toxoid, other vaccines, other medicines, foods, dyes, or preservatives pregnant or trying to get pregnant breast-feeding How should I use this medication? This vaccine is injected into a muscle. It is given by a health care provider. A copy of Vaccine Information Statements will be given before each vaccination. Be sure to read this information carefully each time. This sheet may change often. Talk to your health care provider about the use of this medicine in children. Special care may be needed. Overdosage: If you think you have taken too much of this medicine contact a poison control center or emergency room at once. NOTE: This medicine is only for you. Do not share this medicine with others. What if I miss a dose? This does not apply. This medicine is not for regular use. What may interact with this medication? medicines for cancer chemotherapy medicines that suppress your immune function steroid medicines like prednisone or cortisone This list may not describe all possible  interactions. Give your health care provider a list of all the medicines, herbs, non-prescription drugs, or dietary supplements you use. Also tell them if you smoke, drink alcohol, or use illegal drugs. Some items may interact with your medicine. What should I watch for while using this medication? Mild fever and pain should go away in 3 days or less. Report any unusual symptoms to your health care provider. What side effects may I notice from receiving this medication? Side effects that you should report to your doctor or health care professional as soon as possible: allergic reactions (skin rash, itching or hives; swelling of the face, lips, or tongue) confusion fast, irregular heartbeat fever over 102 degrees F muscle weakness seizures trouble breathing unusual bruising or bleeding Side effects that usually do not require medical attention (report to your doctor or health care professional if they continue or are bothersome): fever of 102 degrees F or less headache joint pain muscle cramps, pain pain, tender at site where injected This list may not describe all possible side effects. Call your doctor for medical advice about side effects. You may report side effects to FDA at 1-800-FDA-1088. Where should I keep my medication? This vaccine is only given by a health care provider. It will not be stored at home. NOTE: This sheet is a summary. It may not cover all possible information. If you have questions about this medicine, talk to your doctor, pharmacist, or health care provider.  2023 Elsevier/Gold Standard (2020-02-25 00:00:00)  Osteoarthritis  Osteoarthritis is a type of arthritis. It refers to joint pain or joint disease. Osteoarthritis affects tissue that covers the ends of bones in joints (cartilage). Cartilage  acts as a cushion between the bones and helps them move smoothly. Osteoarthritis occurs when cartilage in the joints gets worn down. Osteoarthritis is sometimes called  "wear and tear" arthritis. Osteoarthritis is the most common form of arthritis. It often occurs in older people. It is a condition that gets worse over time. The joints most often affected by this condition are in the fingers, toes, hips, knees, and spine, including the neck and lower back. What are the causes? This condition is caused by the wearing down of cartilage that covers the ends of bones. What increases the risk? The following factors may make you more likely to develop this condition: Being age 69 or older. Obesity. Overuse of joints. Past injury of a joint. Past surgery on a joint. Family history of osteoarthritis. What are the signs or symptoms? The main symptoms of this condition are pain, swelling, and stiffness in the joint. Other symptoms may include: An enlarged joint. More pain and further damage caused by small pieces of bone or cartilage that break off and float inside of the joint. Small deposits of bone (osteophytes) that grow on the edges of the joint. A grating or scraping feeling inside the joint when you move it. Popping or creaking sounds when you move. Difficulty walking or exercising. An inability to grip items, twist your hand(s), or control the movements of your hands and fingers. How is this diagnosed? This condition may be diagnosed based on: Your medical history. A physical exam. Your symptoms. X-rays of the affected joint(s). Blood tests to rule out other types of arthritis. How is this treated? There is no cure for this condition, but treatment can help control pain and improve joint function. Treatment may include a combination of therapies, such as: Pain relief techniques, such as: Applying heat and cold to the joint. Massage. A form of talk therapy called cognitive behavioral therapy (CBT). This therapy helps you set goals and follow up on the changes that you make. Medicines for pain and inflammation. The medicines can be taken by mouth or  applied to the skin. They include: NSAIDs, such as ibuprofen. Prescription medicines. Strong anti-inflammatory medicines (corticosteroids). Certain nutritional supplements. A prescribed exercise program. You may work with a physical therapist. Assistive devices, such as a brace, wrap, splint, specialized glove, or cane. A weight control plan. Surgery, such as: An osteotomy. This is done to reposition the bones and relieve pain or to remove loose pieces of bone and cartilage. Joint replacement surgery. You may need this surgery if you have advanced osteoarthritis. Follow these instructions at home: Activity Rest your affected joints as told by your health care provider. Exercise as told by your health care provider. He or she may recommend specific types of exercise, such as: Strengthening exercises. These are done to strengthen the muscles that support joints affected by arthritis. Aerobic activities. These are exercises, such as brisk walking or water aerobics, that increase your heart rate. Range-of-motion activities. These help your joints move more easily. Balance and agility exercises. Managing pain, stiffness, and swelling     If directed, apply heat to the affected area as often as told by your health care provider. Use the heat source that your health care provider recommends, such as a moist heat pack or a heating pad. If you have a removable assistive device, remove it as told by your health care provider. Place a towel between your skin and the heat source. If your health care provider tells you to keep the assistive  device on while you apply heat, place a towel between the assistive device and the heat source. Leave the heat on for 20-30 minutes. Remove the heat if your skin turns bright red. This is especially important if you are unable to feel pain, heat, or cold. You may have a greater risk of getting burned. If directed, put ice on the affected area. To do this: If you  have a removable assistive device, remove it as told by your health care provider. Put ice in a plastic bag. Place a towel between your skin and the bag. If your health care provider tells you to keep the assistive device on during icing, place a towel between the assistive device and the bag. Leave the ice on for 20 minutes, 2-3 times a day. Move your fingers or toes often to reduce stiffness and swelling. Raise (elevate) the injured area above the level of your heart while you are sitting or lying down. General instructions Take over-the-counter and prescription medicines only as told by your health care provider. Maintain a healthy weight. Follow instructions from your health care provider for weight control. Do not use any products that contain nicotine or tobacco, such as cigarettes, e-cigarettes, and chewing tobacco. If you need help quitting, ask your health care provider. Use assistive devices as told by your health care provider. Keep all follow-up visits as told by your health care provider. This is important. Where to find more information [employer] of Arthritis and Musculoskeletal and Skin Diseases: www.niams.http://www.myers.net/ [employer] on Aging: https://walker.com/ American College of Rheumatology: www.rheumatology.org Contact a health care provider if: You have redness, swelling, or a feeling of warmth in a joint that gets worse. You have a fever along with joint or muscle aches. You develop a rash. You have trouble doing your normal activities. Get help right away if: You have pain that gets worse and is not relieved by pain medicine. Summary Osteoarthritis is a type of arthritis that affects tissue covering the ends of bones in joints (cartilage). This condition is caused by the wearing down of cartilage that covers the ends of bones. The main symptom of this condition is pain, swelling, and stiffness in the joint. There is no cure for this condition, but treatment can  help control pain and improve joint function. This information is not intended to replace advice given to you by your health care provider. Make sure you discuss any questions you have with your health care provider. Document Revised: 06/21/2019 Document Reviewed: 06/21/2019 Elsevier Patient Education  2023 Elsevier Inc.  Arthritis Arthritis is a term that is commonly used to refer to joint pain or joint disease. There are more than 100 types of arthritis. What are the causes? The most common cause of this condition is wear and tear of a joint. Other causes include: Gout. Inflammation of a joint. An infection of a joint. Sprains and other injuries near the joint. A reaction to medicines or drugs, or an allergic reaction. In some cases, the cause may not be known. What are the signs or symptoms? The main symptom of this condition is pain in the joint during movement. Other symptoms include: Redness, swelling, or stiffness at a joint. Warmth coming from the joint. Fever. Overall feeling of illness. How is this diagnosed? This condition may be diagnosed with a physical exam and tests, including: Blood tests. Urine tests. Imaging tests, such as X-rays, an MRI, or a CT scan. Sometimes, fluid is removed from a joint for testing. How  is this treated? This condition may be treated with: Treatment of the cause, if it is known. Rest. Raising (elevating) the joint. Applying cold or hot packs to the joint. Medicines to improve symptoms and reduce inflammation. Injections of a steroid, such as cortisone, into the joint to help reduce pain and inflammation. Depending on the cause of your arthritis, you may need to make lifestyle changes to reduce stress on your joint. Changes may include: Exercising more. Losing weight. Follow these instructions at home: Medicines Take over-the-counter and prescription medicines only as told by your health care provider. Do not take aspirin to relieve  pain if your health care provider thinks that gout may be causing your pain. Activity Rest your joint if told by your health care provider. Rest is important when your disease is active and your joint feels painful, swollen, or stiff. Avoid activities that make the pain worse. Balance activity with rest. Exercise your joint regularly with range-of-motion exercises as told by your health care provider. Try doing low-impact exercise, such as: Swimming. Water aerobics. Biking. Walking. Managing pain, stiffness, and swelling     If directed, put ice on the affected joint. To do this: Put ice in a plastic bag. Place a towel between your skin and the bag. Leave the ice on for 20 minutes, 2-3 times a day. Remove the ice if your skin turns bright red. This is very important. If you cannot feel pain, heat, or cold, you have a greater risk of damage to the area. If your joint is swollen, raise (elevate) it above the level of your heart if directed by your health care provider. If your joint feels stiff in the morning, try taking a warm shower. If directed, apply heat to the affected area as often as told by your health care provider. Use the heat source that your health care provider recommends, such as a moist heat pack or a heating pad. To apply heat: Place a towel between your skin and the heat source. Leave the heat on for 20-30 minutes. Remove the heat if your skin turns bright red. This is especially important if you are unable to feel pain, heat, or cold. You have a greater risk of getting burned. General instructions Maintain a healthy weight. Follow instructions from your health care provider for weight control. Do not use any products that contain nicotine or tobacco. These products include cigarettes, chewing tobacco, and vaping devices, such as e-cigarettes. If you need help quitting, ask your health care provider. Keep all follow-up visits. This is important. Where to find more  information [employer] of Health: www.niams.http://www.myers.net/ Contact a health care provider if: The pain gets worse. You have a fever. Get help right away if: You develop severe joint pain, swelling, or redness. Many joints become painful and swollen. You develop severe back pain. You develop severe weakness in your leg. Summary Arthritis is a term that is commonly used to refer to joint pain or joint disease. There are more than 100 types of arthritis. The most common cause of this condition is wear and tear of a joint. Other causes include gout, inflammation or infection of the joint, sprains, or allergies. Symptoms of this condition include redness, swelling, or stiffness of the joint. Other symptoms include warmth, fever, or feeling ill. This condition is treated with rest, elevation, medicines, and applying cold or hot packs. Follow your health care provider's instructions about medicines, activity, exercises, and other home care treatments. This information is not intended to  replace advice given to you by your health care provider. Make sure you discuss any questions you have with your health care provider. Document Revised: 04/03/2021 Document Reviewed: 04/03/2021 Elsevier Patient Education  2023 [employer]

## 2022-03-27 DIAGNOSIS — N182 Chronic kidney disease, stage 2 (mild): Secondary | ICD-10-CM | POA: Diagnosis not present

## 2022-03-27 DIAGNOSIS — I10 Essential (primary) hypertension: Secondary | ICD-10-CM | POA: Diagnosis not present

## 2022-03-27 DIAGNOSIS — E119 Type 2 diabetes mellitus without complications: Secondary | ICD-10-CM | POA: Diagnosis not present

## 2022-05-06 ENCOUNTER — Encounter (INDEPENDENT_AMBULATORY_CARE_PROVIDER_SITE_OTHER): Payer: Self-pay

## 2022-06-03 ENCOUNTER — Encounter: Payer: Medicare Other | Admitting: Family Medicine

## 2022-06-10 ENCOUNTER — Ambulatory Visit: Admission: RE | Admit: 2022-06-10 | Payer: Medicare Other | Source: Home / Self Care | Admitting: Gastroenterology

## 2022-06-10 ENCOUNTER — Encounter: Admission: RE | Payer: Self-pay | Source: Home / Self Care

## 2022-06-10 SURGERY — COLONOSCOPY WITH PROPOFOL
Anesthesia: General

## 2022-06-11 ENCOUNTER — Ambulatory Visit (INDEPENDENT_AMBULATORY_CARE_PROVIDER_SITE_OTHER): Payer: Medicare Other | Admitting: Family

## 2022-06-11 ENCOUNTER — Encounter: Payer: Self-pay | Admitting: Family

## 2022-06-11 VITALS — BP 132/80 | HR 75 | Temp 97.8°F | Wt 152.2 lb

## 2022-06-11 DIAGNOSIS — B372 Candidiasis of skin and nail: Secondary | ICD-10-CM

## 2022-06-11 DIAGNOSIS — R7309 Other abnormal glucose: Secondary | ICD-10-CM

## 2022-06-11 DIAGNOSIS — H9203 Otalgia, bilateral: Secondary | ICD-10-CM

## 2022-06-11 DIAGNOSIS — E119 Type 2 diabetes mellitus without complications: Secondary | ICD-10-CM | POA: Diagnosis not present

## 2022-06-11 LAB — POCT GLYCOSYLATED HEMOGLOBIN (HGB A1C): Hemoglobin A1C: 6.3 % — AB (ref 4.0–5.6)

## 2022-06-11 MED ORDER — CLOTRIMAZOLE 1 % EX CREA: 1.0000 | TOPICAL_CREAM | Freq: Two times a day (BID) | CUTANEOUS | 3 refills | Status: AC

## 2022-06-11 NOTE — Patient Instructions (Addendum)
For ear pressure, please resume fluticasone (Flonase) which is a low-dose steroid which can help with inflammation in your ears.   I suspect you have yeast infection under skin fold. This is called intertrigo candidiasis  Please use topical clotrimazole ointment which is an antifungal and follow the instructions below.   Daily cleansing of intertriginous skin with a mild cleanser followed by drying of affected area with a hair dryer on a cool setting  ?Aeration of affected area when feasible and drying after shower. You may even use blow dryer on COOL setting after showers, baths to dry area  ?Daily application of drying powders, such as powders composed of microporous cellulose  ?Use of absorbent material or clothing, such as cotton or merino wool, to separate skin in folds  ?Application of barrier creams such as zinc oxide in areas that may come in contact with urine or feces  Intertrigo Intertrigo is skin irritation (inflammation) that happens in warm, moist areas of the body. The irritation can cause a rash and make skin raw and itchy. The rash is usually pink or red. It happens mostly between folds of skin or where skin rubs together, such as: Between the toes. In the armpits. In the groin area. Under the belly. Under the breasts. Around the butt area. This condition is not passed from person to person (is not contagious). What are the causes? Heat, moisture, rubbing, and not enough air movement. The condition can be made worse by: Sweat. Bacteria. A fungus, such as yeast. What increases the risk? Moisture in your skin folds. You are more likely to develop this condition if you: Have diabetes. Are overweight. Are not able to move around. Live in a warm and moist climate. Wear splints, braces, or other medical devices. Are not able to control your pee (urine) or poop (stool). What are the signs or symptoms? A pink or red skin rash in the skin fold or near the skin  fold. Raw or scaly skin. Itching. A burning feeling. Bleeding. Leaking fluid. A bad smell. How is this treated? Cleaning and drying your skin. Taking an antibiotic medicine or using an antibiotic skin cream for a bacterial infection. Using an antifungal cream on your skin or taking pills for an infection that was caused by a fungus, such as yeast. Using a steroid ointment to stop the itching and irritation. Separating the skin fold with a clean cotton cloth to absorb moisture and allow air to flow into the area. Follow these instructions at home: Keep the affected area clean and dry. Do not scratch your skin. Stay cool as much as you can. Use an air conditioner or a fan, if you have one. Apply over-the-counter and prescription medicines only as told by your doctor. If you were prescribed an antibiotic medicine, use it as told by your doctor. Do not stop using the antibiotic even if your condition starts to get better. Keep all follow-up visits as told by your doctor. This is important. How is this prevented?  Stay at a healthy weight. Take care of your feet. This is very important if you have diabetes. You should: Wear shoes that fit well. Keep your feet dry. Wear clean cotton or wool socks. Protect the skin in your groin and butt area as told by your doctor. To do this: Follow a regular cleaning routine. Use creams, powders, or ointments that protect your skin. Change protection pads often. Do not wear tight clothes. Wear clothes that: Are loose. Take moisture away from  your body. Are made of cotton. Wear a bra that gives good support, if needed. Shower and dry yourself well after being active. Use a hair dryer on a cool setting to dry between skin folds. Keep your blood sugar under control if you have diabetes. Contact a doctor if: Your symptoms do not get better with treatment. Your symptoms get worse or they spread. You notice more redness and warmth. You have a  fever. Summary Intertrigo is skin irritation that occurs when folds of skin rub together. This condition is caused by heat, moisture, and rubbing. This condition may be treated by cleaning and drying your skin and with medicines. Apply over-the-counter and prescription medicines only as told by your doctor. Keep all follow-up visits as told by your doctor. This is important. This information is not intended to replace advice given to you by your health care provider. Make sure you discuss any questions you have with your health care provider. Document Revised: 04/02/2018 Document Reviewed: 04/02/2018 Elsevier Patient Education  2022 [employer].

## 2022-06-11 NOTE — Assessment & Plan Note (Addendum)
Lab Results  Component Value Date   HGBA1C 6.3 (A) 06/11/2022   Excellent control. Continue metformin 1000mg  QD

## 2022-06-11 NOTE — Progress Notes (Signed)
Subjective:    Patient ID: Natasha Phillips, female    DOB: Oct 05, 1950, 71 y.o.   MRN: [id number]  CC: Natasha Phillips is a 71 y.o. female who presents today for an acute visit.    HPI: Complains of rash under breasts x 1 week, improved.  Itchy She dries off and uses hairdryer after bathing.  She has been taking cortizone cream.  She also complains of bilateral ear pain , which is not constant. Started one week ago, and then waxed and waned. Endorses PND She states h/o vertigo She has been using otc ear ache medication.   NO fever, sinus pain, sore throat, ear discharge, hearing loss, headache, vision change  She is not on antihistamine.       Colonoscopy canceled yesterday  Establish care with Dr Clent Ridges 10/20/21 Following with [facility] nephrology , follow up 03/27/22 . No changes to olmesartan , indapamide.   DM- compliant with metformin 1000mg  BID  HISTORY:  Past Medical History:  Diagnosis Date   Anemia    CKD (chronic kidney disease)    COVID-19    06/18/2021 [facility]   Diabetes mellitus without complication (HCC)    Glaucoma    [facility] with macular edema and Dr. 14/06/2021 sees Q3-4 months   Hypertension    Past Surgical History:  Procedure Laterality Date   BIOPSY THYROID     CESAREAN SECTION     1981 1988   COLONOSCOPY WITH PROPOFOL N/A 01/26/2021   Procedure: COLONOSCOPY WITH PROPOFOL;  Surgeon: 01/28/2021, MD;  Location: [facility] ENDOSCOPY;  Service: Endoscopy;  Laterality: N/A;   ESOPHAGOGASTRODUODENOSCOPY (EGD) WITH PROPOFOL N/A 01/26/2021   Procedure: ESOPHAGOGASTRODUODENOSCOPY (EGD) WITH PROPOFOL;  Surgeon: 01/28/2021, MD;  Location: [facility] ENDOSCOPY;  Service: Endoscopy;  Laterality: N/A;   TUBAL LIGATION     Family History  Problem Relation Age of Onset   Diabetes Mother    Heart failure Mother    Diabetes Sister    Healthy Sister    Healthy Sister    Healthy Sister    Diabetes Brother    Diabetes Sister    Heart failure Father     Brain cancer Brother    Throat cancer Brother    Glaucoma Other        Runs on Paternal Side   Breast cancer Cousin     Allergies: Coreg [carvedilol], Hctz [hydrochlorothiazide], Januvia [sitagliptin], Promethazine, Sulfa antibiotics, and Losartan Current Outpatient Medications on File Prior to Visit  Medication Sig Dispense Refill   aspirin EC 81 MG tablet Take 81 mg by mouth daily.     Ferrous Sulfate 27 MG TABS Take by mouth.     indapamide (LOZOL) 1.25 MG tablet Take 1.25 mg by mouth daily.     metFORMIN (GLUCOPHAGE) 1000 MG tablet TAKE 1 TABLET(1000 MG) BY MOUTH DAILY 90 tablet 4   Multiple Vitamin (MULTIVITAMIN WITH MINERALS) TABS tablet Take 1 tablet by mouth daily.     olmesartan (BENICAR) 5 MG tablet TAKE 3 TABLETS BY MOUTH EVERY MORNING AND AT BEDTIME 540 tablet 3   valACYclovir (VALTREX) 1000 MG tablet Take 1 tablet (1,000 mg total) by mouth 2 (two) times daily. X 5-7 days with food as needed outbreak 90 tablet 0   Rectal Protectant-Emollient (CALMOL-4) 76-10 % SUPP Take as directed (Patient not taking: Reported on 06/11/2022) 6 suppository 0   No current facility-administered medications on file prior to visit.    Social History   Tobacco Use  Smoking status: Never   Smokeless tobacco: Never  Vaping Use   Vaping Use: Never used  Substance Use Topics   Alcohol use: No   Drug use: No    Review of Systems  Constitutional:  Negative for chills and fever.  HENT:  Positive for ear pain and postnasal drip. Negative for congestion, ear discharge and sinus pain.   Respiratory:  Negative for cough and shortness of breath.   Cardiovascular:  Negative for chest pain and palpitations.  Gastrointestinal:  Negative for nausea and vomiting.  Skin:  Positive for rash.      Objective:    BP 132/80   Pulse 75   Temp 97.8 F (36.6 C) (Oral)   Wt 152 lb 3.2 oz (69 kg)   SpO2 98%   BMI 28.76 kg/m   BP Readings from Last 3 Encounters:  06/11/22 132/80  03/21/22 138/82   01/24/22 128/70   Wt Readings from Last 3 Encounters:  06/11/22 152 lb 3.2 oz (69 kg)  03/21/22 157 lb 9.6 oz (71.5 kg)  01/24/22 159 lb 6.4 oz (72.3 kg)     Physical Exam Vitals reviewed.  Constitutional:      Appearance: She is well-developed.  HENT:     Head: Normocephalic and atraumatic.     Right Ear: Hearing, tympanic membrane, ear canal and external ear normal. No decreased hearing noted. No drainage, swelling or tenderness. No middle ear effusion. No foreign body. Tympanic membrane is not erythematous or bulging.     Left Ear: Hearing, tympanic membrane, ear canal and external ear normal. No decreased hearing noted. No drainage, swelling or tenderness.  No middle ear effusion. No foreign body. Tympanic membrane is not erythematous or bulging.     Nose: Nose normal. No rhinorrhea.     Right Sinus: No maxillary sinus tenderness or frontal sinus tenderness.     Left Sinus: No maxillary sinus tenderness or frontal sinus tenderness.     Mouth/Throat:     Pharynx: Uvula midline. No oropharyngeal exudate or posterior oropharyngeal erythema.     Tonsils: No tonsillar abscesses.  Eyes:     Conjunctiva/sclera: Conjunctivae normal.  Cardiovascular:     Rate and Rhythm: Regular rhythm.     Pulses: Normal pulses.     Heart sounds: Normal heart sounds.  Pulmonary:     Effort: Pulmonary effort is normal.     Breath sounds: Normal breath sounds. No wheezing, rhonchi or rales.  Lymphadenopathy:     Head:     Right side of head: No submental, submandibular, tonsillar, preauricular, posterior auricular or occipital adenopathy.     Left side of head: No submental, submandibular, tonsillar, preauricular, posterior auricular or occipital adenopathy.     Cervical: No cervical adenopathy.  Skin:    General: Skin is warm and dry.          Comments: Red brown under area bilateral breasts. No white exudate. Skin is intact.   Neurological:     Mental Status: She is alert.  Psychiatric:         Speech: Speech normal.        Behavior: Behavior normal.        Thought Content: Thought content normal.        Assessment & Plan:   Problem List Items Addressed This Visit       Endocrine   Controlled type 2 diabetes mellitus without complication (HCC)    Lab Results  Component Value Date   HGBA1C 6.3 (A)  06/11/2022  Excellent control. Continue metformin 1000mg  QD        Musculoskeletal and Integument   Candidal intertrigo - Primary    Rash is improved due to cortisone use at home. Counseled patient on how to keep area dry.  Start clotrimazole ointment for 2 weeks.  She will let me know how she is doing      Relevant Medications   clotrimazole (LOTRIMIN) 1 % cream     Other   Ear pain, bilateral    Benign exam today.  Advised her to resume fluticasone.  If symptoms do not improve, consider augmentin.       Other Visit Diagnoses     Elevated glucose       Relevant Orders   POCT HgB A1C (Completed)      Of note: Patient understands the importance of rescheduling colonoscopy with Dr. .   I have discontinued Allegra Lai. Cerullo's furosemide, loratadine, Fiber Choice Fruity Bites, fluticasone, ELDERBERRY PO, and magnesium oxide. I am also having her start on clotrimazole. Additionally, I am having her maintain her multivitamin with minerals, aspirin EC, Calmol-4, Ferrous Sulfate, indapamide, metFORMIN, olmesartan, and valACYclovir.   Meds ordered this encounter  Medications   clotrimazole (LOTRIMIN) 1 % cream    Sig: Apply 1 Application topically 2 (two) times daily for 14 days. Underbreasts    Dispense:  30 g    Refill:  3    Order Specific Question:   Supervising Provider    Answer:   [name] [[id number]]    Return precautions given.   Risks, benefits, and alternatives of the medications and treatment plan prescribed today were discussed, and patient expressed understanding.   Education regarding symptom management and diagnosis given to patient  on AVS.  Continue to follow with [name], Sherlene Shams, MD for routine health maintenance.   [name] Blatt and I agreed with plan.   [name], FNP

## 2022-06-11 NOTE — Assessment & Plan Note (Signed)
Rash is improved due to cortisone use at home. Counseled patient on how to keep area dry.  Start clotrimazole ointment for 2 weeks.  She will let me know how she is doing

## 2022-06-11 NOTE — Assessment & Plan Note (Signed)
Benign exam today.  Advised her to resume fluticasone.  If symptoms do not improve, consider augmentin.

## 2022-06-18 ENCOUNTER — Telehealth (INDEPENDENT_AMBULATORY_CARE_PROVIDER_SITE_OTHER): Payer: Medicare Other | Admitting: Family Medicine

## 2022-06-18 VITALS — Ht 61.0 in | Wt 152.0 lb

## 2022-06-18 DIAGNOSIS — R059 Cough, unspecified: Secondary | ICD-10-CM

## 2022-06-18 MED ORDER — BENZONATATE 100 MG PO CAPS: ORAL_CAPSULE | ORAL | 0 refills | Status: DC

## 2022-06-18 NOTE — Progress Notes (Signed)
Virtual Visit via Video Note  I connected with Natasha Phillips  on 06/18/22 at  1:20 PM EST by a video enabled telemedicine application and verified that I am speaking with the correct person using two identifiers.  Location patient: [place] Location provider:work or home office Persons participating in the virtual visit: patient, provider  I discussed the limitations and requested verbal permission for telemedicine visit. The patient expressed understanding and agreed to proceed.   HPI:  Acute telemedicine visit for cough: -Onset: about 5 days ago, now improving per her report -reports covid test was negative -Symptoms include: cough, laryngitis -Denies:fever, CP, SOB, malaise, NVD -Has tried:hot tea, lozenges -Pertinent past medical history: see below -Pertinent medication allergies: Allergies  Allergen Reactions   Coreg [Carvedilol]     Could not tolerate     Hctz [Hydrochlorothiazide]     Low na 124    Januvia [Sitagliptin] Diarrhea   Promethazine Hives and Itching   Sulfa Antibiotics    Losartan Rash   -COVID-19 vaccine status:  Immunization History  Administered Date(s) Administered   Hepatitis B 09/20/2011, 11/01/2011, 04/03/2012   Hepatitis B, adult 09/20/2011, 11/01/2011, 04/03/2012   Moderna Sars-Covid-2 Vaccination 10/19/2019, 11/16/2019, 05/25/2020   Pneumococcal Conjugate-13 03/12/2018   Pneumococcal Polysaccharide-23 05/19/2013, 05/19/2013   Tdap 02/07/2012     ROS: See pertinent positives and negatives per HPI.  Past Medical History:  Diagnosis Date   Anemia    CKD (chronic kidney disease)    COVID-19    06/18/2021 [facility]   Diabetes mellitus without complication (HCC)    Glaucoma    [facility] with macular edema and Dr. Frederico Hamman sees Q3-4 months   Hypertension     Past Surgical History:  Procedure Laterality Date   BIOPSY THYROID     CESAREAN SECTION     1981 1988   COLONOSCOPY WITH PROPOFOL N/A 01/26/2021   Procedure: COLONOSCOPY WITH PROPOFOL;   Surgeon: [name], MD;  Location: [facility] ENDOSCOPY;  Service: Endoscopy;  Laterality: N/A;   ESOPHAGOGASTRODUODENOSCOPY (EGD) WITH PROPOFOL N/A 01/26/2021   Procedure: ESOPHAGOGASTRODUODENOSCOPY (EGD) WITH PROPOFOL;  Surgeon: [name], MD;  Location: [facility] ENDOSCOPY;  Service: Endoscopy;  Laterality: N/A;   TUBAL LIGATION       Current Outpatient Medications:    aspirin EC 81 MG tablet, Take 81 mg by mouth daily., Disp: , Rfl:    benzonatate (TESSALON PERLES) 100 MG capsule, 1-2 capsules up to twice daily as needed for cough., Disp: 30 capsule, Rfl: 0   clotrimazole (LOTRIMIN) 1 % cream, Apply 1 Application topically 2 (two) times daily for 14 days. Underbreasts, Disp: 30 g, Rfl: 3   Ferrous Sulfate 27 MG TABS, Take by mouth., Disp: , Rfl:    indapamide (LOZOL) 1.25 MG tablet, Take 1.25 mg by mouth daily., Disp: , Rfl:    metFORMIN (GLUCOPHAGE) 1000 MG tablet, TAKE 1 TABLET(1000 MG) BY MOUTH DAILY, Disp: 90 tablet, Rfl: 4   Multiple Vitamin (MULTIVITAMIN WITH MINERALS) TABS tablet, Take 1 tablet by mouth daily., Disp: , Rfl:    olmesartan (BENICAR) 5 MG tablet, TAKE 3 TABLETS BY MOUTH EVERY MORNING AND AT BEDTIME, Disp: 540 tablet, Rfl: 3   valACYclovir (VALTREX) 1000 MG tablet, Take 1 tablet (1,000 mg total) by mouth 2 (two) times daily. X 5-7 days with food as needed outbreak, Disp: 90 tablet, Rfl: 0   Rectal Protectant-Emollient (CALMOL-4) 76-10 % SUPP, Take as directed (Patient not taking: Reported on 06/11/2022), Disp: 6 suppository, Rfl: 0  EXAM:  VITALS  per patient if applicable:  GENERAL: alert, oriented, appears well and in no acute distress  HEENT: atraumatic, conjunttiva clear, no obvious abnormalities on inspection of external nose and ears  NECK: normal movements of the head and neck  LUNGS: on inspection no signs of respiratory distress, breathing rate appears normal, no obvious gross SOB, gasping or wheezing  CV: no obvious cyanosis  MS: moves all  visible extremities without noticeable abnormality  PSYCH/NEURO: pleasant and cooperative, no obvious depression or anxiety, speech and thought processing grossly intact  ASSESSMENT AND PLAN:  Discussed the following assessment and plan:  Cough, unspecified type  -we discussed possible serious and likely etiologies, options for evaluation and workup, limitations of telemedicine visit vs in person visit, treatment, treatment risks and precautions. Pt is agreeable to treatment via telemedicine at this moment. Suspect VURI vs other. She has opted for trial cough rx, monitoring since improving.   Advised to seek prompt virtual visit or in person care if worsening, new symptoms arise, or if is not improving with treatment as expected per our conversation of expected course. Discussed options for follow up care. Did let this patient know that I do telemedicine on Tuesdays and Thursdays for [facility] and those are the days I am logged into the system. Advised to schedule follow up visit with PCP, [place] virtual visits or [facility] if any further questions or concerns to avoid delays in care.   I discussed the assessment and treatment plan with the patient. The patient was provided an opportunity to ask questions and all were answered. The patient agreed with the plan and demonstrated an understanding of the instructions.     Terressa Koyanagi, DO

## 2022-06-18 NOTE — Patient Instructions (Signed)
-  I sent the medication(s) we discussed to your pharmacy: Meds ordered this encounter  Medications   benzonatate (TESSALON PERLES) 100 MG capsule    Sig: 1-2 capsules up to twice daily as needed for cough.    Dispense:  30 capsule    Refill:  0     I hope you are feeling better soon!  Seek in person care promptly if your symptoms worsen, new concerns arise or you are not improving with treatment.  It was nice to meet you today. I help [facility] out with telemedicine visits on Tuesdays and Thursdays and am happy to help if you need a virtual follow up visit on those days. Otherwise, if you have any concerns or questions following this visit please schedule a follow up visit with your Primary Care office or seek care at a local urgent care clinic to avoid delays in care. If you are having severe or life threatening symptoms please call 911 and/or go to the nearest emergency room.   

## 2022-06-26 ENCOUNTER — Other Ambulatory Visit: Payer: Self-pay | Admitting: Family Medicine

## 2022-08-12 NOTE — Progress Notes (Deleted)
Error

## 2022-08-13 ENCOUNTER — Encounter: Payer: Self-pay | Admitting: Nurse Practitioner

## 2022-08-13 ENCOUNTER — Telehealth: Payer: Self-pay

## 2022-08-13 ENCOUNTER — Ambulatory Visit (INDEPENDENT_AMBULATORY_CARE_PROVIDER_SITE_OTHER): Payer: Medicare Other | Admitting: Nurse Practitioner

## 2022-08-13 ENCOUNTER — Ambulatory Visit: Payer: Medicare Other | Admitting: Nurse Practitioner

## 2022-08-13 VITALS — BP 132/78 | HR 91 | Temp 98.6°F | Ht 61.0 in | Wt 149.4 lb

## 2022-08-13 DIAGNOSIS — L209 Atopic dermatitis, unspecified: Secondary | ICD-10-CM

## 2022-08-13 DIAGNOSIS — R300 Dysuria: Secondary | ICD-10-CM

## 2022-08-13 LAB — POC URINALSYSI DIPSTICK (AUTOMATED)
Bilirubin, UA: NEGATIVE
Blood, UA: NEGATIVE
Glucose, UA: NEGATIVE
Nitrite, UA: NEGATIVE
Protein, UA: NEGATIVE
Spec Grav, UA: 1.02 (ref 1.010–1.025)
Urobilinogen, UA: 0.2 E.U./dL
pH, UA: 5.5 (ref 5.0–8.0)

## 2022-08-13 MED ORDER — TRIAMCINOLONE ACETONIDE 0.1 % EX CREA: 1.0000 | TOPICAL_CREAM | Freq: Two times a day (BID) | CUTANEOUS | 2 refills | Status: DC

## 2022-08-13 MED ORDER — CEPHALEXIN 500 MG PO CAPS: 500.0000 mg | ORAL_CAPSULE | Freq: Two times a day (BID) | ORAL | 0 refills | Status: DC

## 2022-08-13 NOTE — Assessment & Plan Note (Signed)
Refills sent on Kenalog cream. Counseled on side effects such as thinning of the skin with prolonged use. Advised not to use for longer than 2 weeks at a time. Discussed with patient not wearing gloves constantly, only wear when cooking and cleaning if necessary. Will refer to Derm if symptoms persist.

## 2022-08-13 NOTE — Assessment & Plan Note (Addendum)
Small leukocytes on UA in office. Will send for microscopic and culture. Start patient on Keflex 500 BID x 5 days while culture is pending. Encouraged adequate hydration.

## 2022-08-13 NOTE — Telephone Encounter (Signed)
Called both home phone and cellphone left a VM on cellphone number, was calling to see if pt was still going to be seen today at [phone]

## 2022-08-13 NOTE — Progress Notes (Signed)
Natasha Morrow, NP-C Phone: [phone]  Natasha Phillips is a 72 y.o. female who presents today for UTI symptoms x 1 week. She reports her urine has been cloudy and there has been an odor.   UTI: Dysuria- Yes Frequency- Yes  Urgency- No  Hematuria- No  Fever- No Abd pain- No  Vaginal d/c- No  She also reports an eczema flare up on bilateral hands. She has been using Hydrocortisone, Benadryl cream, Tea tree oil, calamine lotion, Aquaphor and Vaseline without relief. She had a small amount of left over Kenalog cream that has seemed to help. She has been wearing Nitrile/Latex gloves while doing everything- cooking, cleaning, bathing, sleeping, etc. Reports her hands are dry and itching, red, and the skin feels tight.   Social History   Tobacco Use  Smoking Status Never  Smokeless Tobacco Never    Current Outpatient Medications on File Prior to Visit  Medication Sig Dispense Refill   aspirin EC 81 MG tablet Take 81 mg by mouth daily.     Ferrous Sulfate 27 MG TABS Take by mouth.     indapamide (LOZOL) 1.25 MG tablet Take 1.25 mg by mouth daily.     metFORMIN (GLUCOPHAGE) 1000 MG tablet TAKE 1 TABLET(1000 MG) BY MOUTH DAILY 90 tablet 4   Multiple Vitamin (MULTIVITAMIN WITH MINERALS) TABS tablet Take 1 tablet by mouth daily.     olmesartan (BENICAR) 5 MG tablet TAKE 3 TABLETS BY MOUTH EVERY MORNING AND AT BEDTIME 540 tablet 3   No current facility-administered medications on file prior to visit.     ROS see history of present illness  Objective  Physical Exam Vitals:   08/13/22 1546  BP: 132/78  Pulse: 91  Temp: 98.6 F (37 C)  SpO2: 100%    BP Readings from Last 3 Encounters:  08/13/22 132/78  06/11/22 132/80  03/21/22 138/82   Wt Readings from Last 3 Encounters:  08/13/22 149 lb 6.4 oz (67.8 kg)  06/18/22 152 lb (68.9 kg)  06/11/22 152 lb 3.2 oz (69 kg)    Physical Exam Constitutional:      General: She is not in acute distress.    Appearance: Normal appearance.   HENT:     Head: Normocephalic.  Cardiovascular:     Rate and Rhythm: Normal rate and regular rhythm.     Heart sounds: Normal heart sounds.  Pulmonary:     Effort: Pulmonary effort is normal.     Breath sounds: Normal breath sounds.  Skin:    General: Skin is warm and dry.     Findings: Rash present.     Comments: Dry/cracking, erythematous, inflamed rash noted on dorsal side of bilateral hands  Neurological:     General: No focal deficit present.     Mental Status: She is alert.  Psychiatric:        Mood and Affect: Mood normal.        Behavior: Behavior normal.    Assessment/Plan: Please see individual problem list.  Dysuria Assessment & Plan: Small leukocytes on UA in office. Will send for microscopic and culture. Start patient on Keflex 500 BID x 5 days while culture is pending. Encouraged adequate hydration.   Orders: -     POCT Urinalysis Dipstick (Automated) -     Urinalysis, Routine w reflex microscopic -     Urine Culture -     Cephalexin; Take 1 capsule (500 mg total) by mouth 2 (two) times daily.  Dispense: 10 capsule; Refill: 0  Atopic dermatitis, unspecified type Assessment & Plan: Refills sent on Kenalog cream. Counseled on side effects such as thinning of the skin with prolonged use. Advised not to use for longer than 2 weeks at a time. Discussed with patient not wearing gloves constantly, only wear when cooking and cleaning if necessary. Will refer to Derm if symptoms persist.   Orders: -     Triamcinolone Acetonide; Apply 1 Application topically 2 (two) times daily. As needed for eczema.  Dispense: 30 g; Refill: 2   Return if symptoms worsen or fail to improve.   Natasha Morrow, NP-C [place]

## 2022-08-14 LAB — URINALYSIS, ROUTINE W REFLEX MICROSCOPIC
Bilirubin Urine: NEGATIVE
Hgb urine dipstick: NEGATIVE
Ketones, ur: NEGATIVE
Nitrite: NEGATIVE
RBC / HPF: NONE SEEN (ref 0–?)
Specific Gravity, Urine: 1.02 (ref 1.000–1.030)
Total Protein, Urine: NEGATIVE
Urine Glucose: NEGATIVE
Urobilinogen, UA: 0.2 (ref 0.0–1.0)
pH: 5.5 (ref 5.0–8.0)

## 2022-08-15 LAB — URINE CULTURE
MICRO NUMBER:: 14525840
SPECIMEN QUALITY:: ADEQUATE

## 2022-08-23 DIAGNOSIS — H40053 Ocular hypertension, bilateral: Secondary | ICD-10-CM | POA: Diagnosis not present

## 2022-09-09 DIAGNOSIS — R0789 Other chest pain: Secondary | ICD-10-CM | POA: Diagnosis not present

## 2022-09-09 DIAGNOSIS — N1831 Chronic kidney disease, stage 3a: Secondary | ICD-10-CM | POA: Diagnosis not present

## 2022-09-09 DIAGNOSIS — R002 Palpitations: Secondary | ICD-10-CM | POA: Diagnosis not present

## 2022-09-09 DIAGNOSIS — R0602 Shortness of breath: Secondary | ICD-10-CM | POA: Diagnosis not present

## 2022-09-09 DIAGNOSIS — E119 Type 2 diabetes mellitus without complications: Secondary | ICD-10-CM | POA: Diagnosis not present

## 2022-09-09 DIAGNOSIS — I10 Essential (primary) hypertension: Secondary | ICD-10-CM | POA: Diagnosis not present

## 2022-09-10 ENCOUNTER — Telehealth: Payer: Self-pay | Admitting: Family Medicine

## 2022-09-10 NOTE — Telephone Encounter (Signed)
Pt called in staying that she need a referral for a Gastroenterologist @ [facility] if possible Dr. Haig Prophet. Any questions, she's available [phone]

## 2022-09-13 NOTE — Telephone Encounter (Signed)
I called and spoke with the patient and informed her that the provider stated she could go back and see Dr. Marius Ditch or make an appointment with her for a new referral, she agreed to call Dr. Verlin Grills office and schedule a visit with them.  Amanada Philbrick,cma

## 2022-09-17 ENCOUNTER — Telehealth: Payer: Self-pay

## 2022-09-17 ENCOUNTER — Encounter: Payer: Self-pay | Admitting: Gastroenterology

## 2022-09-17 ENCOUNTER — Other Ambulatory Visit: Payer: Self-pay

## 2022-09-17 ENCOUNTER — Ambulatory Visit (INDEPENDENT_AMBULATORY_CARE_PROVIDER_SITE_OTHER): Payer: Medicare Other | Admitting: Gastroenterology

## 2022-09-17 VITALS — BP 159/90 | HR 89 | Temp 99.0°F | Ht 61.0 in | Wt 149.1 lb

## 2022-09-17 DIAGNOSIS — R293 Abnormal posture: Secondary | ICD-10-CM | POA: Insufficient documentation

## 2022-09-17 DIAGNOSIS — M5126 Other intervertebral disc displacement, lumbar region: Secondary | ICD-10-CM | POA: Insufficient documentation

## 2022-09-17 DIAGNOSIS — Z1211 Encounter for screening for malignant neoplasm of colon: Secondary | ICD-10-CM | POA: Diagnosis not present

## 2022-09-17 DIAGNOSIS — R490 Dysphonia: Secondary | ICD-10-CM

## 2022-09-17 MED ORDER — CLENPIQ 10-3.5-12 MG-GM -GM/175ML PO SOLN: 175.0000 mL | Freq: Once | ORAL | 0 refills | Status: AC

## 2022-09-17 MED ORDER — NA SULFATE-K SULFATE-MG SULF 17.5-3.13-1.6 GM/177ML PO SOLN: 354.0000 mL | Freq: Once | ORAL | 0 refills | Status: AC

## 2022-09-17 MED ORDER — OMEPRAZOLE 40 MG PO CPDR: 40.0000 mg | DELAYED_RELEASE_CAPSULE | Freq: Every day | ORAL | 0 refills | Status: DC

## 2022-09-17 NOTE — Progress Notes (Signed)
Cephas Darby, MD [address]  [place]  [place], [place] [address]  Main: [phone]  Fax: [phone]    Gastroenterology Consultation  Referring Provider:     No ref. provider found Primary Care Physician:  Malachy Mood, MD Primary Gastroenterologist: Dr. Sherri Sear Reason for Consultation: Hoarseness of voice, history of GERD, discuss about colonoscopy        HPI:   Natasha Phillips is a 72 y.o. female referred by Dr. Malachy Mood, MD  for consultation & management of constipation.  Patient reports that she is no longer experiencing hard bowel movements.  She reports mild periumbilical discomfort only with no particular relation to food or position.  She denies any bulging into the umbilicus.  She underwent colonoscopy in 7/22 which was fair prep.  She also underwent upper endoscopy which revealed focal intestinal metaplasia, underwent autoimmune work-up, negative for intrinsic factor antibodies, parietal cell antibodies.  Her serum gastrin levels are normal.  No evidence of H. pylori on biopsies.  Follow-up visit 09/17/2022 Patient made a follow-up appointment to discuss about repeat colonoscopy.  Patient is bothered at her previous colonoscopy in 2022 was not cleaned.  She reports having intermittent constipation.  She states that she did everything that she was instructed to do and she completed the bowel prep, despite that, she was not clean.  She is also concerned about hoarseness of voice that has been ongoing for few months.  She has history of chronic GERD symptoms, does not take omeprazole.  She is doing herbal tea.  However, she still has hoarseness of voice.  She denies any epigastric pain, heartburn, difficulty swallowing.  Her EGD in 2022 was normal.  She is concerned about weight loss of 10 pounds but it has been stable within last 3 months.   NSAIDs: None  Antiplts/Anticoagulants/Anti thrombotics: None  GI Procedures:  EGD and colonoscopy  01/26/2021- Atrophic and nodular mucosa in the gastric body and antrum. - Small hiatal hernia. - Normal examined duodenum. - Biopsies were obtained in the gastric body, at the incisura and in the gastric antrum.  DIAGNOSIS:  A. STOMACH; COLD BIOPSY:  - CHRONIC GASTRITIS WITH FOCAL ATROPHY AND FOCAL INTESTINAL METAPLASIA,  SEE COMMENT.  - NEGATIVE FOR H. PYLORI BY IMMUNOHISTOCHEMISTRY.  - NEGATIVE FOR DYSPLASIA AND MALIGNANCY.  B.  ESOPHAGUS; COLD BIOPSY:  - STRATIFIED SQUAMOUS EPITHELIUM WITHOUT EOSINOPHILS, NEUTROPHILS, OR  REACTIVE CHANGES.  - NEGATIVE FOR DYSPLASIA AND MALIGNANCY.   C.  COLON POLYP, SIGMOID; COLD SNARE:  - POLYPOID MUCOSA WITH MILD CRYPT HYPERPLASIA AND PROMINENT MUCIPHAGES,  COMPATIBLE WITH INFLAMMATORY-TYPE POLYP.  - NEGATIVE FOR DYSPLASIA AND MALIGNANCY.   - Preparation of the colon was fair. - Source of intermittent BRBPR is the patient's internal hemorrhoids - One 6 mm polyp in the sigmoid colon, removed with a cold snare. Resected and retrieved. - A single non-bleeding colonic angioectasia. - The examination was otherwise normal. - The rectum, sigmoid colon, descending colon, transverse colon, ascending colon and cecum are normal. - Non-bleeding internal hemorrhoids.   Past Medical History:  Diagnosis Date   Anemia    CKD (chronic kidney disease)    COVID-19    06/18/2021 [facility]   Diabetes mellitus without complication ([place])    Glaucoma    [facility] with macular edema and Dr. Rupert Stacks sees Q3-4 months   Hypertension     Past Surgical History:  Procedure Laterality Date   BIOPSY THYROID     CESAREAN SECTION  1981 1988   COLONOSCOPY WITH PROPOFOL N/A 01/26/2021   Procedure: COLONOSCOPY WITH PROPOFOL;  Surgeon: Virgel Manifold, MD;  Location: [facility] ENDOSCOPY;  Service: Endoscopy;  Laterality: N/A;   ESOPHAGOGASTRODUODENOSCOPY (EGD) WITH PROPOFOL N/A 01/26/2021   Procedure: ESOPHAGOGASTRODUODENOSCOPY (EGD) WITH PROPOFOL;  Surgeon:  Virgel Manifold, MD;  Location: [facility] ENDOSCOPY;  Service: Endoscopy;  Laterality: N/A;   TUBAL LIGATION      Current Outpatient Medications:    aspirin EC 81 MG tablet, Take 81 mg by mouth daily., Disp: , Rfl:    Ferrous Sulfate 27 MG TABS, Take by mouth., Disp: , Rfl:    indapamide (LOZOL) 1.25 MG tablet, Take 1.25 mg by mouth daily., Disp: , Rfl:    metFORMIN (GLUCOPHAGE) 1000 MG tablet, TAKE 1 TABLET(1000 MG) BY MOUTH DAILY, Disp: 90 tablet, Rfl: 4   Multiple Vitamin (MULTIVITAMIN WITH MINERALS) TABS tablet, Take 1 tablet by mouth daily., Disp: , Rfl:    Na Sulfate-K Sulfate-Mg Sulf 17.5-3.13-1.6 GM/177ML SOLN, Take 354 mLs by mouth once for 1 dose., Disp: 354 mL, Rfl: 0   olmesartan (BENICAR) 5 MG tablet, TAKE 3 TABLETS BY MOUTH EVERY MORNING AND AT BEDTIME, Disp: 540 tablet, Rfl: 3   omeprazole (PRILOSEC) 40 MG capsule, Take 1 capsule (40 mg total) by mouth daily before breakfast., Disp: 30 capsule, Rfl: 0   Sod Picosulfate-Mag Ox-Cit Acd (CLENPIQ) 10-3.5-12 MG-GM -GM/175ML SOLN, Take 175 mLs by mouth once for 1 dose., Disp: 350 mL, Rfl: 0   triamcinolone cream (KENALOG) 0.1 %, Apply 1 Application topically 2 (two) times daily. As needed for eczema., Disp: 30 g, Rfl: 2  Family History  Problem Relation Age of Onset   Diabetes Mother    Heart failure Mother    Diabetes Sister    Healthy Sister    Healthy Sister    Healthy Sister    Diabetes Brother    Diabetes Sister    Heart failure Father    Brain cancer Brother    Throat cancer Brother    Glaucoma Other        Runs on Paternal Side   Breast cancer Cousin      Social History   Tobacco Use   Smoking status: Never   Smokeless tobacco: Never  Vaping Use   Vaping Use: Never used  Substance Use Topics   Alcohol use: No   Drug use: No    Allergies as of 09/17/2022 - Review Complete 09/17/2022  Allergen Reaction Noted   Coreg [carvedilol]  09/01/2020   Hctz [hydrochlorothiazide]  07/22/2019   Januvia  [sitagliptin] Diarrhea 01/23/2016   Promethazine Hives and Itching 07/03/2021   Sulfa antibiotics  01/11/2015   Losartan Rash 02/10/2020    Review of Systems:    All systems reviewed and negative except where noted in HPI.   Physical Exam:  BP (!) 159/90 (BP Location: Left Arm, Patient Position: Sitting, Cuff Size: Normal)   Pulse 89   Temp 99 F (37.2 C) (Oral)   Ht '5\' 1"'$  (1.549 m)   Wt 149 lb 2 oz (67.6 kg)   BMI 28.18 kg/m  No LMP recorded. Patient is postmenopausal.  General:   Alert,  Well-developed, well-nourished, pleasant and cooperative in NAD Head:  Normocephalic and atraumatic. Eyes:  Sclera clear, no icterus.   Conjunctiva pink. Ears:  Normal auditory acuity. Nose:  No deformity, discharge, or lesions. Mouth:  No deformity or lesions,oropharynx pink & moist. Neck:  Supple; no masses or thyromegaly. Lungs:  Respirations even  and unlabored.  Clear throughout to auscultation.   No wheezes, crackles, or rhonchi. No acute distress. Heart:  Regular rate and rhythm; no murmurs, clicks, rubs, or gallops. Abdomen:  Normal bowel sounds. Soft, non-tender and non-distended without masses, hepatosplenomegaly or hernias noted.  No guarding or rebound tenderness.   Rectal: Not performed Msk:  Symmetrical without gross deformities. Good, equal movement & strength bilaterally. Pulses:  Normal pulses noted. Extremities:  No clubbing or edema.  No cyanosis. Neurologic:  Alert and oriented x3;  grossly normal neurologically. Skin:  Intact without significant lesions or rashes. No jaundice. Psych:  Alert and cooperative. Normal mood and affect.  Imaging Studies: Reviewed  Assessment and Plan:   Natasha Phillips is a 72 y.o. female with history of diabetes, hypertension, he have chronic constipation which is fairly under control, also with history of hoarseness of voice  Hoarseness of voice Trial of omeprazole 40 mg once a day before breakfast for 1 month.  If symptoms are  persistent, recommend seeing an ENT for further evaluation  Colon cancer screening, previous colonoscopy was fair prep  Discussed with patient regarding screening colonoscopy with 2-day prep given that previous colonoscopy in 01/2021 was suboptimal   Follow up as needed   Cephas Darby, MD

## 2022-09-17 NOTE — Telephone Encounter (Signed)
Her Creatinine is normal or slightly elevated, taking short term is ok  RV

## 2022-09-17 NOTE — Telephone Encounter (Signed)
Called and left a message for call back  

## 2022-09-17 NOTE — Telephone Encounter (Signed)
Patient states she has chronic kidney disease and she read online that she should not take the omeprazole if she has chronic kidney disease. Please advise?

## 2022-09-18 ENCOUNTER — Telehealth: Payer: Self-pay

## 2022-09-18 NOTE — Telephone Encounter (Signed)
Called patient back and and patient verbalized understanding about the omeprazole being okay to take for short term.

## 2022-09-18 NOTE — Telephone Encounter (Signed)
Called patient and left a message for call back on home number the number that patient left for return call.

## 2022-09-18 NOTE — Telephone Encounter (Signed)
Informed patient that medication was approved by insurance

## 2022-09-18 NOTE — Telephone Encounter (Signed)
Patient return call and left a message on main desk voicemail.

## 2022-09-18 NOTE — Telephone Encounter (Signed)
Submitted PA through cover my meds for the Clenpiq. Called [employer] and they said the [place] does there own PA for this plan. They told me to call [phone]. They submitted the PA over the phone. Informed them that patient took Golytely in the past and was not clean out with this prep. They Marked the request as urgent so should have a answer within 72 hours.

## 2022-09-18 NOTE — Telephone Encounter (Signed)
Called and left  a message for call back on home number and call mobile number and left a message for call back

## 2022-09-18 NOTE — Telephone Encounter (Signed)
Insurance company approved the medication till 09/18/2023

## 2022-09-19 ENCOUNTER — Encounter: Payer: Self-pay | Admitting: Obstetrics and Gynecology

## 2022-09-19 ENCOUNTER — Other Ambulatory Visit (HOSPITAL_COMMUNITY)
Admission: RE | Admit: 2022-09-19 | Discharge: 2022-09-19 | Disposition: A | Discharge status: Home / Self Care | Payer: Medicare Other | Source: Ambulatory Visit | Attending: Obstetrics and Gynecology | Admitting: Obstetrics and Gynecology

## 2022-09-19 ENCOUNTER — Ambulatory Visit (INDEPENDENT_AMBULATORY_CARE_PROVIDER_SITE_OTHER): Payer: Medicare Other | Admitting: Obstetrics and Gynecology

## 2022-09-19 VITALS — BP 154/89 | HR 80 | Ht 61.0 in | Wt 152.4 lb

## 2022-09-19 DIAGNOSIS — Z1231 Encounter for screening mammogram for malignant neoplasm of breast: Secondary | ICD-10-CM

## 2022-09-19 DIAGNOSIS — Z01419 Encounter for gynecological examination (general) (routine) without abnormal findings: Secondary | ICD-10-CM | POA: Diagnosis not present

## 2022-09-19 DIAGNOSIS — Z124 Encounter for screening for malignant neoplasm of cervix: Secondary | ICD-10-CM | POA: Insufficient documentation

## 2022-09-19 NOTE — Progress Notes (Signed)
HPI:      [name] is a 72 y.o. G2P2 who LMP was No LMP recorded. Patient is postmenopausal.  Subjective:   She presents today for her annual examination.  She has been getting annual Pap smears.  These are negative.  She is requesting one today but will consider not getting one in the future. She reports no problems -no postmenopausal bleeding.    Hx: The following portions of the patient's history were reviewed and updated as appropriate:             She  has a past medical history of Anemia, CKD (chronic kidney disease), COVID-19, Diabetes mellitus without complication ([place]), Glaucoma, and Hypertension. She does not have any pertinent problems on file. She  has a past surgical history that includes Tubal ligation; Cesarean section; Biopsy thyroid; Colonoscopy with propofol (N/A, 01/26/2021); and Esophagogastroduodenoscopy (egd) with propofol (N/A, 01/26/2021). Her family history includes Brain cancer in her brother; Breast cancer in her cousin; Diabetes in her brother, mother, sister, and sister; Glaucoma in an other family member; Healthy in her sister, sister, and sister; Heart failure in her father and mother; Throat cancer in her brother. She  reports that she has never smoked. She has never used smokeless tobacco. She reports that she does not drink alcohol and does not use drugs. She has a current medication list which includes the following prescription(s): aspirin ec, ferrous sulfate, indapamide, metformin, multivitamin with minerals, olmesartan, omeprazole, and triamcinolone cream. She is allergic to coreg [carvedilol], hctz [hydrochlorothiazide], januvia [sitagliptin], promethazine, sulfa antibiotics, and losartan.       Review of Systems:  Review of Systems  Constitutional: Denied constitutional symptoms, night sweats, recent illness, fatigue, fever, insomnia and weight loss.  Eyes: Denied eye symptoms, eye pain, photophobia, vision change and visual disturbance.   Ears/Nose/Throat/Neck: Denied ear, nose, throat or neck symptoms, hearing loss, nasal discharge, sinus congestion and sore throat.  Cardiovascular: Denied cardiovascular symptoms, arrhythmia, chest pain/pressure, edema, exercise intolerance, orthopnea and palpitations.  Respiratory: Denied pulmonary symptoms, asthma, pleuritic pain, productive sputum, cough, dyspnea and wheezing.  Gastrointestinal: Denied, gastro-esophageal reflux, melena, nausea and vomiting.  Genitourinary: Denied genitourinary symptoms including symptomatic vaginal discharge, pelvic relaxation issues, and urinary complaints.  Musculoskeletal: Denied musculoskeletal symptoms, stiffness, swelling, muscle weakness and myalgia.  Dermatologic: Denied dermatology symptoms, rash and scar.  Neurologic: Denied neurology symptoms, dizziness, headache, neck pain and syncope.  Psychiatric: Denied psychiatric symptoms, anxiety and depression.  Endocrine: Denied endocrine symptoms including hot flashes and night sweats.   Meds:   Current Outpatient Medications on File Prior to Visit  Medication Sig Dispense Refill   aspirin EC 81 MG tablet Take 81 mg by mouth daily.     Ferrous Sulfate 27 MG TABS Take by mouth.     indapamide (LOZOL) 1.25 MG tablet Take 1.25 mg by mouth daily.     metFORMIN (GLUCOPHAGE) 1000 MG tablet TAKE 1 TABLET(1000 MG) BY MOUTH DAILY 90 tablet 4   Multiple Vitamin (MULTIVITAMIN WITH MINERALS) TABS tablet Take 1 tablet by mouth daily.     olmesartan (BENICAR) 5 MG tablet TAKE 3 TABLETS BY MOUTH EVERY MORNING AND AT BEDTIME 540 tablet 3   omeprazole (PRILOSEC) 40 MG capsule Take 1 capsule (40 mg total) by mouth daily before breakfast. 30 capsule 0   triamcinolone cream (KENALOG) 0.1 % Apply 1 Application topically 2 (two) times daily. As needed for eczema. 30 g 2   No current facility-administered medications on file prior to visit.  Objective:     Vitals:   09/19/22 0918 09/19/22 0924  BP: (!) 170/97  (!) 154/89  Pulse: 80     Filed Weights   09/19/22 0918  Weight: 152 lb 6.4 oz (69.1 kg)              Physical examination General NAD, Conversant  HEENT Atraumatic; Op clear with mmm.  Normo-cephalic. Pupils reactive. Anicteric sclerae  Thyroid/Neck Smooth without nodularity or enlargement. Normal ROM.  Neck Supple.  Skin No rashes, lesions or ulceration. Normal palpated skin turgor. No nodularity.  Breasts: No masses or discharge.  Symmetric.  No axillary adenopathy.  Lungs: Clear to auscultation.No rales or wheezes. Normal Respiratory effort, no retractions.  Heart: NSR.  No murmurs or rubs appreciated. No peripheral edema  Abdomen: Soft.  Non-tender.  No masses.  No HSM. No hernia  Extremities: Moves all appropriately.  Normal ROM for age. No lymphadenopathy.  Neuro: Oriented to PPT.  Normal mood. Normal affect.     Pelvic:   Vulva: Normal appearance.  No lesions.  Vagina: No lesions or abnormalities noted.  Support: Normal pelvic support.  Urethra No masses tenderness or scarring.  Meatus Normal size without lesions or prolapse.  Cervix: Normal appearance.  No lesions.  Anus: Normal exam.  No lesions.  Perineum: Normal exam.  No lesions.        Bimanual   Uterus: Normal size.  Non-tender.  Mobile.  AV.  Adnexae: No masses.  Non-tender to palpation.  Cul-de-sac: Negative for abnormality.     Assessment:    G2P2 Patient Active Problem List   Diagnosis Date Noted   Abnormal posture 09/17/2022   Displacement of lumbar intervertebral disc without myelopathy 09/17/2022   Dysuria 08/13/2022   Atopic dermatitis 08/13/2022   Candidal intertrigo 06/11/2022   Ear pain, bilateral 06/11/2022   Elevated sed rate 01/24/2022   Prediabetes 01/24/2022   Herniated disc, cervical 07/03/2021   Thyroid goiter 07/03/2021   Hypertension 07/03/2021   Abnormal MRI, cervical spine 07/03/2021   Abdominal pain    Hiatal hernia    Gastric atrophy    Special screening for malignant  neoplasms, colon    Polyp of sigmoid colon    H. pylori infection 09/07/2020   Arthritis 09/01/2020   Hypertension associated with diabetes ([place]) 05/19/2020   Scoliosis of thoracolumbar spine 05/19/2020   Facet hypertrophy of lumbar region 05/19/2020   Cervical arthritis 11/26/2019   Benign hypertensive kidney disease with chronic kidney disease 11/15/2019   Stage 3a chronic kidney disease ([place]) 11/15/2019   Hypertensive chronic kidney disease with stage 1 through stage 4 chronic kidney disease, or unspecified chronic kidney disease 11/15/2019   Pain in limb 11/12/2019   Neck pain 08/24/2019   Disorder of both eustachian tubes 08/24/2019   Leg cramps 08/09/2019   CKD (chronic kidney disease) stage 2, GFR 60-89 ml/min 08/03/2019   Leg edema 07/22/2019   Hyponatremia 04/17/2019   Gastroesophageal reflux disease 09/16/2018   Erosion of oral mucosa 10/28/2016   Thyroid nodule 10/14/2015   Abnormal cervical Papanicolaou smear 01/11/2015   Absolute anemia 01/11/2015   Mild mitral insufficiency 01/11/2015   CN (constipation) 01/11/2015   Calcium blood increased 01/11/2015   LBP (low back pain) 01/11/2015   Obesity (BMI 30-39.9) 01/11/2015   Leg paresthesia 01/11/2015   Neuralgia neuritis, sciatic nerve 01/11/2015   Avitaminosis D 01/11/2015   Essential hypertension 11/22/2014   Controlled type 2 diabetes mellitus without complication ([place]) [id number]     1.  Well woman exam with routine gynecological exam   2. Screening mammogram for breast cancer   3. Cervical cancer screening        Plan:            1.  Basic Screening Recommendations The basic screening recommendations for asymptomatic women were discussed with the patient during her visit.  The age-appropriate recommendations were discussed with her and the rational for the tests reviewed.  When I am informed by the patient that another primary care physician has previously obtained the age-appropriate tests and they are  up-to-date, only outstanding tests are ordered and referrals given as necessary.  Abnormal results of tests will be discussed with her when all of her results are completed.  Routine preventative health maintenance measures emphasized: Exercise/Diet/Weight control, Tobacco Warnings, Alcohol/Substance use risks and Stress Management Pap performed-mammogram ordered  Orders Orders Placed This Encounter  Procedures   MM DIGITAL SCREENING BILATERAL    No orders of the defined types were placed in this encounter.         F/U  Return in about 1 year (around 09/19/2023) for Annual Physical.  Finis Bud, M.D. 09/19/2022 9:41 AM

## 2022-09-19 NOTE — Progress Notes (Signed)
Patients presents for annual exam today. She states doing well.  Patient has requested a pap smear, ordered. Patient is due for mammogram, ordered. Annual labs are declined. She states no other questions or concerns at this time.

## 2022-09-20 LAB — CYTOLOGY - PAP
Comment: NEGATIVE
Diagnosis: NEGATIVE
High risk HPV: NEGATIVE

## 2022-09-23 ENCOUNTER — Telehealth: Payer: Self-pay

## 2022-09-23 MED ORDER — GOLYTELY 236 G PO SOLR: 4000.0000 mL | Freq: Once | ORAL | 0 refills | Status: AC

## 2022-09-23 NOTE — Addendum Note (Signed)
Addended by: Ulyess Blossom L on: 09/23/2022 10:19 AM   Modules accepted: Orders

## 2022-09-23 NOTE — Telephone Encounter (Signed)
Patient states that she is concern and has not been checked by a kidney doctor since last year and does not feel comfortable taking the clenpiq. Told patient I would call in the golytely then.Gave her the instructions for this and she wrote down the instructions

## 2022-09-23 NOTE — Telephone Encounter (Signed)
Her creatinine clearance is greater than 30 based on her kidney function test from 03/2022.  Based on this, Clenpiq is not contraindicated and there is no dosage adjustment required.  If patient is very concerned, she can use large-volume GoLytely.  She has to make sure to be hydrated and drink electrolytes like Gatorade or electrolyte solution during the prep.  Margretta Zamorano [employer]

## 2022-09-23 NOTE — Telephone Encounter (Signed)
Patient states she got a letter from her insurance company that if she has kidney disease, or diabetes she does not need to be using the Clenpiq prep. Please advise what prep you recommend her using. She states the side effects on the paper seem like they would be bad for health if she takes it. She states she would like to talk to Dr. Marius Ditch personally. Explained to patient she is in procedures and then has clinic this afternoon and she would most likely send me the message and I will call her back in what she says. She said okay but wants a answer today.

## 2022-09-24 DIAGNOSIS — H2513 Age-related nuclear cataract, bilateral: Secondary | ICD-10-CM | POA: Diagnosis not present

## 2022-09-24 DIAGNOSIS — E113413 Type 2 diabetes mellitus with severe nonproliferative diabetic retinopathy with macular edema, bilateral: Secondary | ICD-10-CM | POA: Diagnosis not present

## 2022-09-24 DIAGNOSIS — H40053 Ocular hypertension, bilateral: Secondary | ICD-10-CM | POA: Diagnosis not present

## 2022-09-26 DIAGNOSIS — R079 Chest pain, unspecified: Secondary | ICD-10-CM | POA: Diagnosis not present

## 2022-09-26 DIAGNOSIS — R0602 Shortness of breath: Secondary | ICD-10-CM | POA: Diagnosis not present

## 2022-10-07 DIAGNOSIS — I350 Nonrheumatic aortic (valve) stenosis: Secondary | ICD-10-CM | POA: Diagnosis not present

## 2022-10-07 DIAGNOSIS — R079 Chest pain, unspecified: Secondary | ICD-10-CM | POA: Diagnosis not present

## 2022-10-07 DIAGNOSIS — R0602 Shortness of breath: Secondary | ICD-10-CM | POA: Diagnosis not present

## 2022-10-07 DIAGNOSIS — I10 Essential (primary) hypertension: Secondary | ICD-10-CM | POA: Diagnosis not present

## 2022-10-07 DIAGNOSIS — N1831 Chronic kidney disease, stage 3a: Secondary | ICD-10-CM | POA: Diagnosis not present

## 2022-10-07 DIAGNOSIS — R002 Palpitations: Secondary | ICD-10-CM | POA: Diagnosis not present

## 2022-10-07 DIAGNOSIS — E119 Type 2 diabetes mellitus without complications: Secondary | ICD-10-CM | POA: Diagnosis not present

## 2022-10-11 ENCOUNTER — Encounter: Payer: Self-pay | Admitting: Gastroenterology

## 2022-10-18 ENCOUNTER — Ambulatory Visit
Admission: RE | Admit: 2022-10-18 | Discharge: 2022-10-18 | Disposition: A | Discharge status: Home / Self Care | Payer: Medicare Other | Attending: Gastroenterology | Admitting: Gastroenterology

## 2022-10-18 ENCOUNTER — Ambulatory Visit: Payer: Medicare Other | Admitting: Certified Registered Nurse Anesthetist

## 2022-10-18 ENCOUNTER — Encounter
Admission: RE | Disposition: A | Discharge status: Home / Self Care | Payer: Self-pay | Source: Home / Self Care | Attending: Gastroenterology

## 2022-10-18 DIAGNOSIS — N189 Chronic kidney disease, unspecified: Secondary | ICD-10-CM | POA: Diagnosis not present

## 2022-10-18 DIAGNOSIS — Z09 Encounter for follow-up examination after completed treatment for conditions other than malignant neoplasm: Secondary | ICD-10-CM | POA: Insufficient documentation

## 2022-10-18 DIAGNOSIS — Z1211 Encounter for screening for malignant neoplasm of colon: Secondary | ICD-10-CM | POA: Insufficient documentation

## 2022-10-18 DIAGNOSIS — E1122 Type 2 diabetes mellitus with diabetic chronic kidney disease: Secondary | ICD-10-CM | POA: Insufficient documentation

## 2022-10-18 DIAGNOSIS — K635 Polyp of colon: Secondary | ICD-10-CM | POA: Diagnosis not present

## 2022-10-18 DIAGNOSIS — Z7984 Long term (current) use of oral hypoglycemic drugs: Secondary | ICD-10-CM | POA: Insufficient documentation

## 2022-10-18 DIAGNOSIS — D120 Benign neoplasm of cecum: Secondary | ICD-10-CM | POA: Diagnosis not present

## 2022-10-18 DIAGNOSIS — Z8616 Personal history of COVID-19: Secondary | ICD-10-CM | POA: Diagnosis not present

## 2022-10-18 DIAGNOSIS — I129 Hypertensive chronic kidney disease with stage 1 through stage 4 chronic kidney disease, or unspecified chronic kidney disease: Secondary | ICD-10-CM | POA: Insufficient documentation

## 2022-10-18 HISTORY — PX: COLONOSCOPY WITH PROPOFOL: SHX5780

## 2022-10-18 LAB — GLUCOSE, CAPILLARY: Glucose-Capillary: 78 mg/dL (ref 70–99)

## 2022-10-18 SURGERY — COLONOSCOPY WITH PROPOFOL
Anesthesia: General

## 2022-10-18 MED ORDER — PROPOFOL 500 MG/50ML IV EMUL
INTRAVENOUS | Status: DC | PRN
  Administered 2022-10-18: 160 ug/kg/min via INTRAVENOUS

## 2022-10-18 MED ORDER — SODIUM CHLORIDE 0.9 % IV SOLN
INTRAVENOUS | Status: DC
  Administered 2022-10-18: 20 mL/h via INTRAVENOUS

## 2022-10-18 MED ORDER — PROPOFOL 10 MG/ML IV BOLUS
INTRAVENOUS | Status: DC | PRN
  Administered 2022-10-18: 50 mg via INTRAVENOUS
  Administered 2022-10-18: 20 mg via INTRAVENOUS

## 2022-10-18 NOTE — Anesthesia Procedure Notes (Addendum)
Date/Time: 10/18/2022 10:54 AM  Performed by: Malva Cogan, CRNAPre-anesthesia Checklist: Patient identified, Emergency Drugs available, Suction available, Patient being monitored and Timeout performed Patient Re-evaluated:Patient Re-evaluated prior to induction Oxygen Delivery Method: Nasal cannula Induction Type: IV induction Placement Confirmation: CO2 detector and positive ETCO2

## 2022-10-18 NOTE — Op Note (Signed)
[facility] Gastroenterology Patient Name: Natasha Phillips Procedure Date: 10/18/2022 10:44 AM MRN: [id number] Account #: [id number] Date of Birth: Aug 31, 1950 Admit Type: Outpatient Age: 72 Room: [facility] ENDO ROOM 1 Gender: Female Note Status: Finalized Instrument Name: Colonoscope [id number] Procedure:             Colonoscopy Indications:           Screening for colorectal malignant neoplasm, Last                         colonoscopy: July 2022 Providers:             Toney Reil MD, MD Referring MD:          Florina Ou. Bonney Aid (Referring MD) Medicines:             General Anesthesia Complications:         No immediate complications. Estimated blood loss: None. Procedure:             Pre-Anesthesia Assessment:                        - Prior to the procedure, a History and Physical was                         performed, and patient medications and allergies were                         reviewed. The patient is competent. The risks and                         benefits of the procedure and the sedation options and                         risks were discussed with the patient. All questions                         were answered and informed consent was obtained.                         Patient identification and proposed procedure were                         verified by the physician, the nurse, the                         anesthesiologist, the anesthetist and the technician                         in the pre-procedure area in the procedure room in the                         endoscopy suite. Mental Status Examination: alert and                         oriented. Airway Examination: normal oropharyngeal                         airway and neck mobility. Respiratory Examination:  clear to auscultation. CV Examination: normal.                         Prophylactic Antibiotics: The patient does not require                         prophylactic  antibiotics. Prior Anticoagulants: The                         patient has taken no anticoagulant or antiplatelet                         agents. ASA Grade Assessment: II - A patient with mild                         systemic disease. After reviewing the risks and                         benefits, the patient was deemed in satisfactory                         condition to undergo the procedure. The anesthesia                         plan was to use general anesthesia. Immediately prior                         to administration of medications, the patient was                         re-assessed for adequacy to receive sedatives. The                         heart rate, respiratory rate, oxygen saturations,                         blood pressure, adequacy of pulmonary ventilation, and                         response to care were monitored throughout the                         procedure. The physical status of the patient was                         re-assessed after the procedure.                        After obtaining informed consent, the colonoscope was                         passed under direct vision. Throughout the procedure,                         the patient's blood pressure, pulse, and oxygen                         saturations were monitored continuously. The  Colonoscope was introduced through the anus and                         advanced to the the cecum, identified by appendiceal                         orifice and ileocecal valve. The colonoscopy was                         unusually difficult due to significant looping and the                         patient's body habitus. Successful completion of the                         procedure was aided by applying abdominal pressure.                         The patient tolerated the procedure well. The quality                         of the bowel preparation was evaluated using the BBPS                          [facility] Bowel Preparation Scale) with scores of: Right                         Colon = 3, Transverse Colon = 3 and Left Colon = 3                         (entire mucosa seen well with no residual staining,                         small fragments of stool or opaque liquid). The total                         BBPS score equals 9. The ileocecal valve, appendiceal                         orifice, and rectum were photographed. Findings:      The perianal and digital rectal examinations were normal. Pertinent       negatives include normal sphincter tone and no palpable rectal lesions.      The entire examined colon appeared normal.      A diminutive polyp was found in the cecum. The polyp was sessile. The       polyp was removed with a cold biopsy forceps. Resection and retrieval       were complete. Estimated blood loss: none.      The retroflexed view of the distal rectum and anal verge was normal and       showed no anal or rectal abnormalities. Impression:            - The entire examined colon is normal.                        - One diminutive polyp in the cecum, removed with a  cold biopsy forceps. Resected and retrieved.                        - The distal rectum and anal verge are normal on                         retroflexion view. Recommendation:        - Discharge patient to home (with escort).                        - Resume previous diet today.                        - Continue present medications.                        - Await pathology results.                        - Repeat colonoscopy in 7-10 years for surveillance                         based on pathology results. Procedure Code(s):     --- Professional ---                        [phone], Colonoscopy, flexible; with biopsy, single or                         multiple Diagnosis Code(s):     --- Professional ---                        Z12.11, Encounter for screening for malignant neoplasm                          of colon                        D12.0, Benign neoplasm of cecum CPT copyright 2022 American Medical Association. All rights reserved. The codes documented in this report are preliminary and upon coder review may  be revised to meet current compliance requirements. Dr. Libby Maw Toney Reil MD, MD 10/18/2022 11:23:14 AM This report has been signed electronically. Number of Addenda: 0 Note Initiated On: 10/18/2022 10:44 AM Scope Withdrawal Time: 0 hours 13 minutes 11 seconds  Total Procedure Duration: 0 hours 21 minutes 29 seconds  Estimated Blood Loss:  Estimated blood loss: none.      [facility]

## 2022-10-18 NOTE — Anesthesia Preprocedure Evaluation (Signed)
Anesthesia Evaluation  Patient identified by MRN, date of birth, ID band Patient awake    Reviewed: Allergy & Precautions, NPO status , Patient's Chart, lab work & pertinent test results  Airway Mallampati: III  TM Distance: >3 FB Neck ROM: full    Dental  (+) Chipped, Dental Advidsory Given   Pulmonary neg pulmonary ROS   Pulmonary exam normal        Cardiovascular hypertension, negative cardio ROS Normal cardiovascular exam     Neuro/Psych negative neurological ROS  negative psych ROS   GI/Hepatic Neg liver ROS,GERD  ,,  Endo/Other  negative endocrine ROSdiabetes    Renal/GU negative Renal ROS  negative genitourinary   Musculoskeletal   Abdominal   Peds  Hematology negative hematology ROS (+)   Anesthesia Other Findings Past Medical History: No date: Anemia No date: CKD (chronic kidney disease) No date: COVID-19     Comment:  06/18/2021 [facility] No date: Diabetes mellitus without complication No date: Glaucoma     Comment:  [facility] with macular edema and Dr. Frederico Hamman sees Q3-4              months No date: Hypertension  Past Surgical History: No date: BIOPSY THYROID No date: CESAREAN SECTION     Comment:  1981 1988 01/26/2021: COLONOSCOPY WITH PROPOFOL; N/A     Comment:  Procedure: COLONOSCOPY WITH PROPOFOL;  Surgeon:               [name], MD;  Location: [facility] ENDOSCOPY;                Service: Endoscopy;  Laterality: N/A; 01/26/2021: ESOPHAGOGASTRODUODENOSCOPY (EGD) WITH PROPOFOL; N/A     Comment:  Procedure: ESOPHAGOGASTRODUODENOSCOPY (EGD) WITH               PROPOFOL;  Surgeon: [name], MD;  Location:               [facility] ENDOSCOPY;  Service: Endoscopy;  Laterality: N/A; No date: TUBAL LIGATION  BMI    Body Mass Index: 28.49 kg/m      Reproductive/Obstetrics negative OB ROS                             Anesthesia Physical Anesthesia  Plan  ASA: 2  Anesthesia Plan: General   Post-op Pain Management: Minimal or no pain anticipated   Induction: Intravenous  PONV Risk Score and Plan: 3 and Propofol infusion, TIVA and Ondansetron  Airway Management Planned: Nasal Cannula  Additional Equipment: None  Intra-op Plan:   Post-operative Plan:   Informed Consent: I have reviewed the patients History and Physical, chart, labs and discussed the procedure including the risks, benefits and alternatives for the proposed anesthesia with the patient or authorized representative who has indicated his/her understanding and acceptance.     Dental advisory given  Plan Discussed with: CRNA and Surgeon  Anesthesia Plan Comments: (Discussed risks of anesthesia with patient, including possibility of difficulty with spontaneous ventilation under anesthesia necessitating airway intervention, PONV, and rare risks such as cardiac or respiratory or neurological events, and allergic reactions. Discussed the role of CRNA in patient's perioperative care. Patient understands.)       Anesthesia Quick Evaluation

## 2022-10-18 NOTE — Transfer of Care (Signed)
Immediate Anesthesia Transfer of Care Note  Patient: Natasha Phillips  Procedure(s) Performed: COLONOSCOPY WITH PROPOFOL  Patient Location: PACU  Anesthesia Type:General  Level of Consciousness: drowsy  Airway & Oxygen Therapy: Patient Spontanous Breathing  Post-op Assessment: Report given to RN and Post -op Vital signs reviewed and stable  Post vital signs: Reviewed and stable  Last Vitals:  Vitals Value Taken Time  BP    Temp    Pulse    Resp 21 10/18/22 1125  SpO2    Vitals shown include unvalidated device data.  Last Pain:  Vitals:   10/18/22 0943  TempSrc: Temporal  PainSc: 0-No pain         Complications: No notable events documented.

## 2022-10-18 NOTE — H&P (Signed)
Arlyss Repress, MD [address]  [address]  [place], [place] [address]  Main: [phone]  Fax: [phone] Pager: [phone]  Primary Care Physician:  Vena Austria, MD Primary Gastroenterologist:  Dr. Arlyss Repress  Pre-Procedure History & Physical: HPI:  Natasha Phillips is a 72 y.o. female is here for an colonoscopy.   Past Medical History:  Diagnosis Date   Anemia    CKD (chronic kidney disease)    COVID-19    06/18/2021 [facility]   Diabetes mellitus without complication    Glaucoma    [facility] with macular edema and Dr. Frederico Hamman sees Q3-4 months   Hypertension     Past Surgical History:  Procedure Laterality Date   BIOPSY THYROID     CESAREAN SECTION     1981 1988   COLONOSCOPY WITH PROPOFOL N/A 01/26/2021   Procedure: COLONOSCOPY WITH PROPOFOL;  Surgeon: [name], MD;  Location: [facility] ENDOSCOPY;  Service: Endoscopy;  Laterality: N/A;   ESOPHAGOGASTRODUODENOSCOPY (EGD) WITH PROPOFOL N/A 01/26/2021   Procedure: ESOPHAGOGASTRODUODENOSCOPY (EGD) WITH PROPOFOL;  Surgeon: [name], MD;  Location: [facility] ENDOSCOPY;  Service: Endoscopy;  Laterality: N/A;   TUBAL LIGATION      Prior to Admission medications   Medication Sig Start Date End Date Taking? Authorizing Provider  aspirin EC 81 MG tablet Take 81 mg by mouth daily.   Yes [provider]  Ferrous Sulfate 27 MG TABS Take by mouth.   Yes [provider]  indapamide (LOZOL) 1.25 MG tablet Take 1.25 mg by mouth daily. 06/30/21  Yes [provider]  metFORMIN (GLUCOPHAGE) 1000 MG tablet TAKE 1 TABLET(1000 MG) BY MOUTH DAILY 10/31/21  Yes [name], [name], MD  Multiple Vitamin (MULTIVITAMIN WITH MINERALS) TABS tablet Take 1 tablet by mouth daily.   Yes [provider]  olmesartan (BENICAR) 5 MG tablet TAKE 3 TABLETS BY MOUTH EVERY MORNING AND AT BEDTIME 12/28/21  Yes [name], [name], MD  omeprazole (PRILOSEC) 40 MG capsule Take 1 capsule  (40 mg total) by mouth daily before breakfast. 09/17/22  Yes Tequila Rottmann, Loel Dubonnet, MD  triamcinolone cream (KENALOG) 0.1 % Apply 1 Application topically 2 (two) times daily. As needed for eczema. 08/13/22  Yes Bethanie Dicker, NP    Allergies as of 09/17/2022 - Review Complete 09/17/2022  Allergen Reaction Noted   Coreg [carvedilol]  09/01/2020   Hctz [hydrochlorothiazide]  07/22/2019   Januvia [sitagliptin] Diarrhea 01/23/2016   Promethazine Hives and Itching 07/03/2021   Sulfa antibiotics  01/11/2015   Losartan Rash 02/10/2020    Family History  Problem Relation Age of Onset   Diabetes Mother    Heart failure Mother    Diabetes Sister    Healthy Sister    Healthy Sister    Healthy Sister    Diabetes Brother    Diabetes Sister    Heart failure Father    Brain cancer Brother    Throat cancer Brother    Glaucoma Other        Runs on Paternal Side   Breast cancer Cousin     Social History   Socioeconomic History   Marital status: Widowed    Spouse name: Not on file   Number of children: 2   Years of education: College   Highest education level: Not on file  Occupational History   Occupation: Retired 2014    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  Tobacco Use   Smoking status: Never   Smokeless tobacco: Never  Vaping Use   Vaping Use: Never used  Substance and Sexual Activity   Alcohol use: No   Drug use: No   Sexual activity: Not Currently    Birth control/protection: None  Other Topics Concern   Not on file  Social History Narrative   Lives at home    Social Determinants of Health   Financial Resource Strain: Low Risk  (09/07/2020)   Overall Financial Resource Strain (CARDIA)    Difficulty of Paying Living Expenses: Not very hard  Food Insecurity: No Food Insecurity (07/27/2020)   Hunger Vital Sign    Worried About Running Out of Food in the Last Year: Never true    Ran Out of Food in the Last Year: Never true  Transportation Needs: No Transportation Needs  (06/07/2019)   PRAPARE - Administrator, Civil Service (Medical): No    Lack of Transportation (Non-Medical): No  Physical Activity: Sufficiently Active (07/27/2020)   Exercise Vital Sign    Days of Exercise per Week: 7 days    Minutes of Exercise per Session: 30 min  Stress: No Stress Concern Present (06/07/2019)   [employer] of Occupational Health - Occupational Stress Questionnaire    Feeling of Stress : Not at all  Social Connections: Unknown (06/07/2019)   Social Connection and Isolation Panel [NHANES]    Frequency of Communication with Friends and Family: Patient declined    Frequency of Social Gatherings with Friends and Family: Patient declined    Attends Religious Services: Patient declined    Database administrator or Organizations: Patient declined    Attends Banker Meetings: Patient declined    Marital Status: Patient declined  Intimate Partner Violence: Unknown (06/07/2019)   Humiliation, Afraid, Rape, and Kick questionnaire    Fear of Current or Ex-Partner: Patient declined    Emotionally Abused: Patient declined    Physically Abused: Patient declined    Sexually Abused: Patient declined    Review of Systems: See HPI, otherwise negative ROS  Physical Exam: BP (!) 183/88   Pulse 81   Temp (!) 97.4 F (36.3 C) (Temporal)   Resp 20   Ht 5\' 1"  (1.549 m)   Wt 68.4 kg   SpO2 100%   BMI 28.49 kg/m  General:   Alert,  pleasant and cooperative in NAD Head:  Normocephalic and atraumatic. Neck:  Supple; no masses or thyromegaly. Lungs:  Clear throughout to auscultation.    Heart:  Regular rate and rhythm. Abdomen:  Soft, nontender and nondistended. Normal bowel sounds, without guarding, and without rebound.   Neurologic:  Alert and  oriented x4;  grossly normal neurologically.  Impression/Plan: Natasha Phillips is here for an colonoscopy to be performed for colon cancer screening  Risks, benefits, limitations, and alternatives  regarding  colonoscopy have been reviewed with the patient.  Questions have been answered.  All parties agreeable.   Lannette Donath, MD  10/18/2022, 10:45 AM

## 2022-10-21 ENCOUNTER — Encounter: Payer: Self-pay | Admitting: Family Medicine

## 2022-10-21 ENCOUNTER — Ambulatory Visit (INDEPENDENT_AMBULATORY_CARE_PROVIDER_SITE_OTHER): Payer: Medicare Other | Admitting: Family Medicine

## 2022-10-21 ENCOUNTER — Encounter: Payer: Self-pay | Admitting: Gastroenterology

## 2022-10-21 VITALS — BP 144/88 | HR 72 | Temp 98.6°F | Ht 61.0 in | Wt 152.6 lb

## 2022-10-21 DIAGNOSIS — I152 Hypertension secondary to endocrine disorders: Secondary | ICD-10-CM | POA: Diagnosis not present

## 2022-10-21 DIAGNOSIS — E119 Type 2 diabetes mellitus without complications: Secondary | ICD-10-CM

## 2022-10-21 DIAGNOSIS — Z7984 Long term (current) use of oral hypoglycemic drugs: Secondary | ICD-10-CM | POA: Diagnosis not present

## 2022-10-21 DIAGNOSIS — E1159 Type 2 diabetes mellitus with other circulatory complications: Secondary | ICD-10-CM | POA: Diagnosis not present

## 2022-10-21 DIAGNOSIS — E559 Vitamin D deficiency, unspecified: Secondary | ICD-10-CM | POA: Diagnosis not present

## 2022-10-21 DIAGNOSIS — H6121 Impacted cerumen, right ear: Secondary | ICD-10-CM | POA: Diagnosis not present

## 2022-10-21 DIAGNOSIS — I129 Hypertensive chronic kidney disease with stage 1 through stage 4 chronic kidney disease, or unspecified chronic kidney disease: Secondary | ICD-10-CM

## 2022-10-21 DIAGNOSIS — E118 Type 2 diabetes mellitus with unspecified complications: Secondary | ICD-10-CM | POA: Diagnosis not present

## 2022-10-21 LAB — SURGICAL PATHOLOGY

## 2022-10-21 NOTE — Progress Notes (Signed)
SUBJECTIVE:   Chief Complaint  Patient presents with   Transitions Of Care   HPI Patient presents to clinic to transfer care.  No acute concerns today.  Hypertension Asymptomatic.  Currently takes Benicar 15 mg twice daily and indapamide 1.25 mg daily.  On ASA 81 mg daily.  Reports BP at home 119/78.  She is followed by Dr. Willaim Sheng at [facility].  Type 2 diabetes Asymptomatic.  Currently taking Glucophage 1000 mg daily and tolerating medication well.  Does not check blood sugars at home.  Weight loss has decreased since 2020 due to monitoring diet.  GERD. Followed by GI.  Currently takes Prilosec 40 mg daily.  Aortic stenosis Was noted on recent echo 09/26/2022.  Follows with Dr. Juliann Pares at [facility].  Recommends conservative management.  She is currently asymptomatic.  PERTINENT PMH / PSH: Hypertension CKD stage IIIa DM type II IDA History of aortic stenosis  OBJECTIVE:  BP (!) 144/88   Pulse 72   Temp 98.6 F (37 C) (Oral)   Ht  (1.549 m)   Wt 152 lb 9.6 oz (69.2 kg)   SpO2 98%   BMI 28.83 kg/m    Physical Exam Vitals reviewed.  Constitutional:      General: She is not in acute distress.    Appearance: She is normal weight. She is not ill-appearing.  HENT:     Head: Normocephalic.     Right Ear: There is impacted cerumen.  Eyes:     Conjunctiva/sclera: Conjunctivae normal.  Cardiovascular:     Rate and Rhythm: Normal rate and regular rhythm.     Pulses: Normal pulses.  Pulmonary:     Effort: Pulmonary effort is normal.     Breath sounds: Normal breath sounds.  Abdominal:     General: Bowel sounds are normal.  Neurological:     Mental Status: She is alert. Mental status is at baseline.  Psychiatric:        Mood and Affect: Mood normal.        Behavior: Behavior normal.        Thought Content: Thought content normal.        Judgment: Judgment normal.     ASSESSMENT/PLAN:  Hypertension associated with diabetes Assessment &  Plan: Chronic.  Asymptomatic.  Not at goal less than 140/80 per JNC 8 guidelines for age.  Currently takes Benicar 15 mg daily and indapamide 1.25 mg daily.  Recent cardiology note recommends continuing clonidine and olmesartan Recent nephrology note recommends continuing olmesartan and indapamide Will defer management to cardiology and nephrology   Orders: -     Comprehensive metabolic panel; Future -     CBC with Differential/Platelet; Future -     TSH; Future -     Microalbumin / creatinine urine ratio; Future  Hypertensive chronic kidney disease with stage 1 through stage 4 chronic kidney disease, or unspecified chronic kidney disease Assessment & Plan: Recent GFR 47.17 On indapamide 1.25 mg daily and Benicar 15 mg twice daily Follows with nephrology, Dr. Austin Miles at [facility].  Orders: -     Olmesartan Medoxomil; Take 3 tablets (15 mg total) by mouth 2 (two) times daily.  Dispense: 540 tablet; Refill: 3  Avitaminosis D -     VITAMIN D 25 Hydroxy (Vit-D Deficiency, Fractures); Future  Controlled type 2 diabetes mellitus with complication, without long-term current use of insulin Assessment & Plan: Chronic.  Stable on current medication.  Recent A1c 6.3. Continue metformin 1000 mg daily On ARB  therapy. Not on statin therapy, recent LDL at goal less than 70. Fasting lipids at next visit Recommend yearly eye exam Recommend yearly foot exam Urine ACR next visit  Orders: -     Hemoglobin A1c; Future -     Lipid panel; Future -     Vitamin B12; Future  Ceruminosis, right Assessment & Plan: Incidental finding on exam.  Has had previous flushing of ears in past.  Denies any ringing of ears or loss of hearing.  Endorses muffled sound on right side.  Offered ear flushing.  Patient agreeable to procedure.  Right ear flushed with Debrox.  Procedure tolerated well.  Care instructions provided postprocedure. Return precautions provided   HCM Mammogram. Due 09/19/2022.  Patient to call for  an appointment. Last Pap 09/19/22, NILM, HPV negative.  Follows with OB/GYN.  Due 09/18/24.   Medicare annual wellness due Tdap due.  Patient declined Eye exam due 5/24 Colonoscopy up-to-date.  Due 04/31 Pneumonia completed Hepatitis C screening completed Decline shingles vaccine  PDMP reviewed  Return for annual visit with fasting labs 1 week prior.  Dana Allan, MD

## 2022-10-21 NOTE — Anesthesia Postprocedure Evaluation (Signed)
Anesthesia Post Note  Patient: Natasha Phillips  Procedure(s) Performed: COLONOSCOPY WITH PROPOFOL  Patient location during evaluation: Endoscopy Anesthesia Type: General Level of consciousness: awake and alert Pain management: pain level controlled Vital Signs Assessment: post-procedure vital signs reviewed and stable Respiratory status: spontaneous breathing, nonlabored ventilation, respiratory function stable and patient connected to nasal cannula oxygen Cardiovascular status: blood pressure returned to baseline and stable Postop Assessment: no apparent nausea or vomiting Anesthetic complications: no   No notable events documented.   Last Vitals:  Vitals:   10/18/22 1135 10/18/22 1145  BP: 134/77 (!) 163/94  Pulse: 82   Resp: 17   Temp:    SpO2: 100% 100%    Last Pain:  Vitals:   10/18/22 1145  TempSrc:   PainSc: 0-No pain                 Stephanie Coup

## 2022-10-21 NOTE — Patient Instructions (Addendum)
It was a pleasure meeting you today. Thank you for allowing me to take part in your health care.  Our goals for today as we discussed include:  Schedule Medicare Annual Wellness Visit   Recommend Tetanus Vaccination.  This is given every 10 years.   Recommend Shingles vaccine.  This is a 2 dose series and can be given at your local pharmacy.  Please talk to your pharmacist about this.   Pneumonia vaccine is up to date  For Mammogram. Please call to schedule appointment. [facility] [address] [place], [place] [address] [phone]   Follow up with OBGYN  Follow up with  Kidney doctor        If you have any questions or concerns, please do not hesitate to call the office at [phone].  I look forward to our next visit and until then take care and stay safe.  Regards,   Dana Allan, MD   [facility]

## 2022-10-22 ENCOUNTER — Encounter: Payer: Self-pay | Admitting: Gastroenterology

## 2022-10-27 ENCOUNTER — Encounter: Payer: Self-pay | Admitting: Family Medicine

## 2022-10-27 DIAGNOSIS — H6121 Impacted cerumen, right ear: Secondary | ICD-10-CM | POA: Insufficient documentation

## 2022-10-27 MED ORDER — OLMESARTAN MEDOXOMIL 5 MG PO TABS: 15.0000 mg | ORAL_TABLET | Freq: Two times a day (BID) | ORAL | 3 refills | Status: DC

## 2022-10-27 NOTE — Assessment & Plan Note (Signed)
Recent GFR 47.17 On indapamide 1.25 mg daily and Benicar 15 mg twice daily Follows with nephrology, Dr. Austin Miles at [facility].

## 2022-10-27 NOTE — Assessment & Plan Note (Addendum)
Incidental finding on exam.  Has had previous flushing of ears in past.  Denies any ringing of ears or loss of hearing.  Endorses muffled sound on right side.  Offered ear flushing.  Patient agreeable to procedure.  Right ear flushed with Debrox.  Procedure tolerated well.  Care instructions provided postprocedure. Return precautions provided

## 2022-10-27 NOTE — Assessment & Plan Note (Signed)
Chronic.  Stable on current medication.  Recent A1c 6.3. Continue metformin 1000 mg daily On ARB therapy. Not on statin therapy, recent LDL at goal less than 70. Fasting lipids at next visit Recommend yearly eye exam Recommend yearly foot exam Urine ACR next visit

## 2022-10-27 NOTE — Assessment & Plan Note (Signed)
Chronic.  Asymptomatic.  Not at goal less than 140/80 per JNC 8 guidelines for age.  Currently takes Benicar 15 mg daily and indapamide 1.25 mg daily.  Recent cardiology note recommends continuing clonidine and olmesartan Recent nephrology note recommends continuing olmesartan and indapamide Will defer management to cardiology and nephrology

## 2022-10-31 ENCOUNTER — Telehealth: Payer: Self-pay

## 2022-10-31 NOTE — Telephone Encounter (Signed)
Her colonoscopy was normal.  Sometimes, it may take several days to have normal bowel movement depending on her bowel pattern prior to colonoscopy.  Not sure if she had regular bowel movements prior to colonoscopy. Have her take MiraLAX 34 g daily along with Metamucil daily with good amount of water  RV

## 2022-10-31 NOTE — Telephone Encounter (Signed)
Called and left a message for call back  

## 2022-10-31 NOTE — Telephone Encounter (Signed)
Patient had colonoscopy on 10/18/2022 and has been constipated since. She states this has never happen with a colonoscopy before. She states the first bowel movement after the colonoscopy took 7 days. Now the last time she had a bowel movement was on Tuesday 10/29/2022. Patient states on Tuesday her stool was green but was not hard. She states she has tried Prune Juice, stool softener, Supplement for Bowels, Suppository's. Has not taken Miralax because she states this has not done anything in the past. Patient wants to know what you recommend.   Patient states she has been calling and leaving message on the main line since Monday. She states she has left 2 messages with no return call. I answered the call today.

## 2022-11-01 NOTE — Telephone Encounter (Signed)
Patient called back verbalized understanding of instructions

## 2022-11-01 NOTE — Telephone Encounter (Signed)
Tried to call patient and number kept ringing and could not leave a message. Sent mychart message

## 2022-11-05 ENCOUNTER — Other Ambulatory Visit: Payer: Self-pay | Admitting: Family Medicine

## 2022-11-05 DIAGNOSIS — Z1231 Encounter for screening mammogram for malignant neoplasm of breast: Secondary | ICD-10-CM

## 2022-11-14 ENCOUNTER — Other Ambulatory Visit (INDEPENDENT_AMBULATORY_CARE_PROVIDER_SITE_OTHER): Payer: Medicare Other

## 2022-11-14 DIAGNOSIS — E559 Vitamin D deficiency, unspecified: Secondary | ICD-10-CM

## 2022-11-14 DIAGNOSIS — E1159 Type 2 diabetes mellitus with other circulatory complications: Secondary | ICD-10-CM

## 2022-11-14 DIAGNOSIS — I152 Hypertension secondary to endocrine disorders: Secondary | ICD-10-CM

## 2022-11-14 DIAGNOSIS — E118 Type 2 diabetes mellitus with unspecified complications: Secondary | ICD-10-CM

## 2022-11-14 LAB — COMPREHENSIVE METABOLIC PANEL
ALT: 12 U/L (ref 0–35)
AST: 20 U/L (ref 0–37)
Albumin: 4.2 g/dL (ref 3.5–5.2)
Alkaline Phosphatase: 91 U/L (ref 39–117)
BUN: 22 mg/dL (ref 6–23)
CO2: 31 mEq/L (ref 19–32)
Calcium: 10.4 mg/dL (ref 8.4–10.5)
Chloride: 99 mEq/L (ref 96–112)
Creatinine, Ser: 1.16 mg/dL (ref 0.40–1.20)
GFR: 47.88 mL/min — ABNORMAL LOW (ref >60.00)
Glucose, Bld: 106 mg/dL — ABNORMAL HIGH (ref 70–99)
Potassium: 4.2 mEq/L (ref 3.5–5.1)
Sodium: 138 mEq/L (ref 135–145)
Total Bilirubin: 0.5 mg/dL (ref 0.2–1.2)
Total Protein: 7.4 g/dL (ref 6.0–8.3)

## 2022-11-14 LAB — CBC WITH DIFFERENTIAL/PLATELET
Basophils Absolute: 0 10*3/uL (ref 0.0–0.1)
Basophils Relative: 0.6 % (ref 0.0–3.0)
Eosinophils Absolute: 0.1 10*3/uL (ref 0.0–0.7)
Eosinophils Relative: 3 % (ref 0.0–5.0)
HCT: 34.7 % — ABNORMAL LOW (ref 36.0–46.0)
Hemoglobin: 11.8 g/dL — ABNORMAL LOW (ref 12.0–15.0)
Lymphocytes Relative: 38 % (ref 12.0–46.0)
Lymphs Abs: 1.8 10*3/uL (ref 0.7–4.0)
MCHC: 33.9 g/dL (ref 30.0–36.0)
MCV: 90.6 fl (ref 78.0–100.0)
Monocytes Absolute: 0.3 10*3/uL (ref 0.1–1.0)
Monocytes Relative: 6.5 % (ref 3.0–12.0)
Neutro Abs: 2.4 10*3/uL (ref 1.4–7.7)
Neutrophils Relative %: 51.9 % (ref 43.0–77.0)
Platelets: 312 10*3/uL (ref 150.0–400.0)
RBC: 3.83 Mil/uL — ABNORMAL LOW (ref 3.87–5.11)
RDW: 12.8 % (ref 11.5–15.5)
WBC: 4.6 10*3/uL (ref 4.0–10.5)

## 2022-11-14 LAB — LIPID PANEL
Cholesterol: 134 mg/dL (ref 0–200)
HDL: 57.3 mg/dL (ref >39.00)
LDL Cholesterol: 64 mg/dL (ref 0–99)
NonHDL: 77.06
Total CHOL/HDL Ratio: 2
Triglycerides: 66 mg/dL (ref 0.0–149.0)
VLDL: 13.2 mg/dL (ref 0.0–40.0)

## 2022-11-14 LAB — MICROALBUMIN / CREATININE URINE RATIO
Creatinine,U: 26.9 mg/dL
Microalb Creat Ratio: 2.6 mg/g (ref 0.0–30.0)
Microalb, Ur: <0.7 mg/dL (ref 0.0–1.9)

## 2022-11-14 LAB — TSH: TSH: 0.54 u[IU]/mL (ref 0.35–5.50)

## 2022-11-14 LAB — VITAMIN B12: Vitamin B-12: 708 pg/mL (ref 211–911)

## 2022-11-14 LAB — VITAMIN D 25 HYDROXY (VIT D DEFICIENCY, FRACTURES): VITD: 45.71 ng/mL (ref 30.00–100.00)

## 2022-11-14 LAB — HEMOGLOBIN A1C: Hgb A1c MFr Bld: 6.6 % — ABNORMAL HIGH (ref 4.6–6.5)

## 2022-11-20 ENCOUNTER — Telehealth: Payer: Self-pay | Admitting: Gastroenterology

## 2022-11-20 ENCOUNTER — Telehealth: Payer: Self-pay | Admitting: Family Medicine

## 2022-11-20 NOTE — Telephone Encounter (Signed)
Called patient and informed her colonoscopy was normal and informed her this was give to her on 11/01/2022. She states she for got about about the results.

## 2022-11-20 NOTE — Telephone Encounter (Signed)
Pt called in asking for her colonoscopy results. Pt would like a call back.

## 2022-11-20 NOTE — Telephone Encounter (Signed)
Pt called in asking for her lab results from 11/14/22. Pt would like a call back.

## 2022-11-20 NOTE — Telephone Encounter (Signed)
Pt lvmm needing results from her procedure please return call

## 2022-11-20 NOTE — Telephone Encounter (Signed)
No notes attached to lab results.   Please advise, thanks!

## 2022-11-28 ENCOUNTER — Ambulatory Visit
Admission: RE | Admit: 2022-11-28 | Discharge: 2022-11-28 | Disposition: A | Discharge status: Home / Self Care | Payer: Medicare Other | Source: Ambulatory Visit | Attending: Family Medicine | Admitting: Family Medicine

## 2022-11-28 DIAGNOSIS — Z1231 Encounter for screening mammogram for malignant neoplasm of breast: Secondary | ICD-10-CM | POA: Insufficient documentation

## 2022-12-03 ENCOUNTER — Encounter: Payer: Self-pay | Admitting: Family Medicine

## 2022-12-05 ENCOUNTER — Telehealth: Payer: Self-pay | Admitting: Family Medicine

## 2022-12-05 ENCOUNTER — Other Ambulatory Visit: Payer: Self-pay | Admitting: Family Medicine

## 2022-12-05 DIAGNOSIS — H8110 Benign paroxysmal vertigo, unspecified ear: Secondary | ICD-10-CM

## 2022-12-05 MED ORDER — MECLIZINE HCL 12.5 MG PO TABS
12.5000 mg | ORAL_TABLET | Freq: Three times a day (TID) | ORAL | 0 refills | Status: DC | PRN
Start: 2022-12-05 — End: 2023-12-14

## 2022-12-05 NOTE — Telephone Encounter (Signed)
Pt called in asking to speak with Dr. Clent Ridges nurse. As per pt, her vertigo been bothering her since last Tuesday. She mentioned she has an appt coming on Tuesday 6/4, and she was wondering if she can have some meds sent over to her pharmacy before that?

## 2022-12-06 NOTE — Telephone Encounter (Addendum)
Epley Maneuver sent through [employer]. Pt notified of medication & to check mychart for a technique to try at home.

## 2022-12-10 ENCOUNTER — Ambulatory Visit (INDEPENDENT_AMBULATORY_CARE_PROVIDER_SITE_OTHER): Payer: Medicare Other | Admitting: Family Medicine

## 2022-12-10 VITALS — BP 130/78 | HR 71 | Ht 61.0 in | Wt 150.4 lb

## 2022-12-10 DIAGNOSIS — I152 Hypertension secondary to endocrine disorders: Secondary | ICD-10-CM

## 2022-12-10 DIAGNOSIS — H6692 Otitis media, unspecified, left ear: Secondary | ICD-10-CM | POA: Diagnosis not present

## 2022-12-10 DIAGNOSIS — E1159 Type 2 diabetes mellitus with other circulatory complications: Secondary | ICD-10-CM

## 2022-12-10 DIAGNOSIS — N1831 Chronic kidney disease, stage 3a: Secondary | ICD-10-CM

## 2022-12-10 DIAGNOSIS — E118 Type 2 diabetes mellitus with unspecified complications: Secondary | ICD-10-CM | POA: Diagnosis not present

## 2022-12-10 DIAGNOSIS — Z0000 Encounter for general adult medical examination without abnormal findings: Secondary | ICD-10-CM | POA: Diagnosis not present

## 2022-12-10 DIAGNOSIS — Z7984 Long term (current) use of oral hypoglycemic drugs: Secondary | ICD-10-CM

## 2022-12-10 MED ORDER — AMOXICILLIN-POT CLAVULANATE 875-125 MG PO TABS
1.0000 | ORAL_TABLET | Freq: Two times a day (BID) | ORAL | 0 refills | Status: DC
Start: 2022-12-10 — End: 2022-12-10

## 2022-12-10 MED ORDER — SACCHAROMYCES BOULARDII 250 MG PO CAPS
250.0000 mg | ORAL_CAPSULE | Freq: Every day | ORAL | 0 refills | Status: AC
Start: 2022-12-10 — End: ?

## 2022-12-10 MED ORDER — AMOXICILLIN-POT CLAVULANATE 875-125 MG PO TABS
1.0000 | ORAL_TABLET | Freq: Two times a day (BID) | ORAL | 0 refills | Status: AC
Start: 2022-12-10 — End: 2022-12-15

## 2022-12-10 NOTE — Patient Instructions (Addendum)
It was a pleasure meeting you today. Thank you for allowing me to take part in your health care.  Our goals for today as we discussed include:  Start Augmentin 1 tablet two times a day for  5 days Start Probiotics daily and continue for 14 days after completion of antibiotics  Follow up as needed  Recommend Tetanus Vaccination.  This is given every 10 years.   Recommend Shingles vaccine.  This is a 2 dose series and can be given at your local pharmacy.  Please talk to your pharmacist about this.   Schedule Medicare Annual Wellness Visit   Schedule annual physical in 1 year.  Fast for 10 hours to have blood work at physical.  If you have any questions or concerns, please do not hesitate to call the office at [phone].  I look forward to our next visit and until then take care and stay safe.  Regards,   Dana Allan, MD   [facility]

## 2022-12-10 NOTE — Progress Notes (Addendum)
SUBJECTIVE:   Chief Complaint  Patient presents with   Annual Exam    Patient has concerns with bilateral ears, more left than right, patient says that the ears ache, patient stated that the ears had an echo or bubbly sound, has been a problem a little over a week, medication meclizine that was prescribed may work but not 100%   Ear Pain   HPI Patient presents to clinic for annual physical  Concern today for bilateral ear issue Endorses had vertigo 1 week ago. Similar symptom in past that resolved with Meclizine.  Refill had been request and felt a little better after this.  Today she endorses echos sound in ear and swishing in ears, mild left ear pain.  More in right side.  Denies any fevers, ear discharge, no headaches, rhinorrhea or nasal congestion.  Does not recall recent URI or any sick contacts.  Hypertension Asymptomatic.  Currently takes Benicar 15 mg twice daily and indapamide 1.25 mg daily.  On ASA 81 mg daily.  Reports BP at home 120-130-79-80.  She is followed by Dr. Willaim Sheng at [facility].  Type 2 diabetes Asymptomatic.  Currently taking Glucophage 1000 mg daily and tolerating medication well.  Does not check blood sugars at home.   Review of Systems - Negative except above noted in HPI    PERTINENT PMH / PSH: Hypertension CKD stage IIIa DM type II IDA History of aortic stenosis  OBJECTIVE:  BP 130/78   Pulse 71   Ht 5\' 1"  (1.549 m)   Wt 150 lb 6.4 oz (68.2 kg)   SpO2 100%   BMI 28.42 kg/m    Physical Exam Vitals reviewed.  Constitutional:      General: She is not in acute distress.    Appearance: She is normal weight. She is not ill-appearing.  HENT:     Head: Normocephalic.     Right Ear: External ear normal. There is impacted cerumen.     Left Ear: External ear normal. A middle ear effusion is present. Tympanic membrane is not erythematous.  Eyes:     Conjunctiva/sclera: Conjunctivae normal.  Cardiovascular:     Rate and Rhythm: Normal rate and  regular rhythm.     Pulses: Normal pulses.  Pulmonary:     Effort: Pulmonary effort is normal.     Breath sounds: Normal breath sounds.  Abdominal:     General: Bowel sounds are normal.  Neurological:     Mental Status: She is alert. Mental status is at baseline.  Psychiatric:        Mood and Affect: Mood normal.        Behavior: Behavior normal.        Thought Content: Thought content normal.        Judgment: Judgment normal.       12/10/2022    8:50 AM 10/21/2022    2:01 PM 08/13/2022    3:47 PM 06/11/2022   10:21 AM 03/21/2022    9:21 AM  Depression screen PHQ 2/9  Decreased Interest 0 0 0 0 0  Down, Depressed, Hopeless 0 0 0 0 0  PHQ - 2 Score 0 0 0 0 0  Altered sleeping 0      Tired, decreased energy 0      Change in appetite 0      Feeling bad or failure about yourself  0      Trouble concentrating 0      Moving slowly or fidgety/restless 0  Suicidal thoughts 0      PHQ-9 Score 0           10/21/2022    2:01 PM 08/13/2022    3:47 PM 06/12/2020   10:40 AM 03/28/2020   11:05 AM  GAD 7 : Generalized Anxiety Score  Nervous, Anxious, on Edge 0 0 0 0  Control/stop worrying 0 0 0 0  Worry too much - different things 0 0 0 0  Trouble relaxing 0 0 0 0  Restless 0 0 0 0  Easily annoyed or irritable 0 0 0 0  Afraid - awful might happen 0 0 0 0  Total GAD 7 Score 0 0 0 0  Anxiety Difficulty Not difficult at all Not difficult at all Not difficult at all       ASSESSMENT/PLAN:  Annual physical exam Assessment & Plan: HCM Mammogram. Due 09/19/2022.  Patient to call for an appointment. Last Pap 09/19/22, NILM, HPV negative.  Follows with OB/GYN.  Due 09/18/24.   Medicare annual wellness due Recommend Tetanus booster. Eye exam due, scheduled 11/24 per patient Colonoscopy up-to-date.  Due 04/31 Pneumonia completed Hepatitis C screening completed Decline shingles vaccine Depression/GAD screening completed   Otitis of left ear Assessment & Plan: New problem.   Symptoms ongoing for 1 week.  Decreased hearing, ear pain and feeling of fluid in ear.  History of eustachian tube dysfunction and BPPV  Start Augmentin 1 BID x 5 days Start Probiotics daily Continue Meclizine 12.5 mg daily as needed Follow up if no improvement in symptoms   Orders: -     Saccharomyces boulardii; Take 1 capsule (250 mg total) by mouth daily.  Dispense: 90 capsule; Refill: 0 -     Amoxicillin-Pot Clavulanate; Take 1 tablet by mouth 2 (two) times daily for 5 days.  Dispense: 10 tablet; Refill: 0  Type 2 diabetes mellitus with complications (HCC) Assessment & Plan: Chronic.  Stable on current medication.  Recent A1c 6.6. Continue metformin 1000 mg daily On ARB therapy. Not on statin therapy, recent LDL at goal less than 70. Eye exam scheduled for 11/24 Foot exam previously discontinued by former PCP for A1c <7 Repeat A1c annually   Stage 3a chronic kidney disease (HCC) Assessment & Plan: Chronic Blood pressure well controlled Follows with nephrology Avoid nephrotoxic agents   Hypertension associated with diabetes [facility]) Assessment & Plan: Chronic.  Asymptomatic.  Well controlled per JNC 8 guidelines for age <140/90.   Currently taking Benicar 15 mg daily and indapamide 1.25 mg daily.  Follows with cardiology and nephrology       PDMP reviewed  Return in about 1 year (around 12/10/2023) for PCP.  Dana Allan, MD

## 2022-12-11 ENCOUNTER — Telehealth: Payer: Self-pay | Admitting: Family Medicine

## 2022-12-11 NOTE — Telephone Encounter (Signed)
pt called stating she picked up some medication that the provider prescribed to her on yesterday and it made her dizzy

## 2022-12-11 NOTE — Telephone Encounter (Signed)
Spoke with patient and she is going to try taking the medication again but if it causes vomiting again she will stop the mediation.

## 2022-12-14 ENCOUNTER — Encounter: Payer: Self-pay | Admitting: Family Medicine

## 2022-12-14 DIAGNOSIS — Z0000 Encounter for general adult medical examination without abnormal findings: Secondary | ICD-10-CM | POA: Insufficient documentation

## 2022-12-14 DIAGNOSIS — H6692 Otitis media, unspecified, left ear: Secondary | ICD-10-CM | POA: Insufficient documentation

## 2022-12-14 NOTE — Assessment & Plan Note (Signed)
Chronic.  Asymptomatic.  Well controlled per JNC 8 guidelines for age <140/90.   Currently taking Benicar 15 mg daily and indapamide 1.25 mg daily.  Follows with cardiology and nephrology

## 2022-12-14 NOTE — Assessment & Plan Note (Signed)
Chronic Blood pressure well controlled Follows with nephrology Avoid nephrotoxic agents

## 2022-12-14 NOTE — Assessment & Plan Note (Addendum)
HCM Mammogram. Due 09/19/2022.  Patient to call for an appointment. Last Pap 09/19/22, NILM, HPV negative.  Follows with OB/GYN.  Due 09/18/24.   Medicare annual wellness due Recommend Tetanus booster. Eye exam due, scheduled 11/24 per patient Colonoscopy up-to-date.  Due 04/31 Pneumonia completed Hepatitis C screening completed Decline shingles vaccine Depression/GAD screening completed

## 2022-12-14 NOTE — Assessment & Plan Note (Signed)
Chronic.  Stable on current medication.  Recent A1c 6.6. Continue metformin 1000 mg daily On ARB therapy. Not on statin therapy, recent LDL at goal less than 70. Eye exam scheduled for 11/24 Foot exam previously discontinued by former PCP for A1c <7 Repeat A1c annually

## 2022-12-14 NOTE — Assessment & Plan Note (Addendum)
New problem.  Symptoms ongoing for 1 week.  Decreased hearing, ear pain and feeling of fluid in ear.  History of eustachian tube dysfunction and BPPV  Start Augmentin 1 BID x 5 days Start Probiotics daily Continue Meclizine 12.5 mg daily as needed Follow up if no improvement in symptoms

## 2023-01-15 ENCOUNTER — Telehealth: Payer: Self-pay | Admitting: Family Medicine

## 2023-01-15 DIAGNOSIS — E119 Type 2 diabetes mellitus without complications: Secondary | ICD-10-CM

## 2023-01-15 MED ORDER — METFORMIN HCL 1000 MG PO TABS
ORAL_TABLET | ORAL | 1 refills | Status: DC
Start: 2023-01-15 — End: 2023-07-25

## 2023-01-15 NOTE — Telephone Encounter (Signed)
Prescription Request  01/15/2023  LOV: 12/10/2022  What is the name of the medication or equipment? metFORMIN (GLUCOPHAGE) 1000 MG tablet  Have you contacted your pharmacy to request a refill? Yes   Which pharmacy would you like this sent to?   [id number] [place], [place] - [address] AT [facility] [address] [place] [address] Phone: [phone] Fax: [phone]    Patient notified that their request is being sent to the clinical staff for review and that they should receive a response within 2 business days.   Please advise at Mobile [phone] (mobile)

## 2023-01-15 NOTE — Telephone Encounter (Signed)
Refill sent.

## 2023-03-14 ENCOUNTER — Ambulatory Visit (INDEPENDENT_AMBULATORY_CARE_PROVIDER_SITE_OTHER): Payer: Medicare Other | Admitting: *Deleted

## 2023-03-14 VITALS — Ht 61.0 in | Wt 152.0 lb

## 2023-03-14 DIAGNOSIS — Z0000 Encounter for general adult medical examination without abnormal findings: Secondary | ICD-10-CM

## 2023-03-14 NOTE — Patient Instructions (Signed)
Ms. Boedigheimer , Thank you for taking time to come for your Medicare Wellness Visit. I appreciate your ongoing commitment to your health goals. Please review the following plan we discussed and let me know if I can assist you in the future.   Referrals/Orders/Follow-Ups/Clinician Recommendations None  This is a list of the screening recommended for you and due dates:  Health Maintenance  Topic Date Due   Zoster (Shingles) Vaccine (1 of 2) Never done   Eye exam for diabetics  11/28/2022   Flu Shot  Never done   COVID-19 Vaccine (4 - 2023-24 season) 03/09/2023   Hemoglobin A1C  05/17/2023   Yearly kidney function blood test for diabetes  11/14/2023   Yearly kidney health urinalysis for diabetes  11/14/2023   Mammogram  11/28/2023   Medicare Annual Wellness Visit  03/13/2024   Colon Cancer Screening  10/17/2029   DEXA scan (bone density measurement)  Completed   Hepatitis C Screening  Completed   HPV Vaccine  Aged Out   DTaP/Tdap/Td vaccine  Discontinued   Pneumonia Vaccine  Discontinued   Complete foot exam   Discontinued    Advanced directives: (Declined) Advance directive discussed with you today. Even though you declined this today, please call our office should you change your mind, and we can give you the proper paperwork for you to fill out. Will pick up paperwork at the office  Next Medicare Annual Wellness Visit scheduled for next year: Yes  03/17/24 @ 10:30

## 2023-03-14 NOTE — Progress Notes (Signed)
Subjective:   Natasha Phillips is a 72 y.o. female who presents for Medicare Annual (Subsequent) preventive examination.  Visit Completed : Not in person/Virtual  I connected with Natasha Phillips on 03/14/23 by a audio enabled telemedicine application and verified that I am speaking with the correct person using two identifiers;   Patient's location Home  Provider Location: Home Office  I discussed the limitations of evaluation and management by telemedicine. The patient expressed understanding and agreed to proceed.  Vital Signs: Unable to obtain new vitals due to this being a telehealth visit.    Review of Systems      Cardiac Risk Factors include: advanced age (>4men, >17 women);diabetes mellitus;hypertension     Objective:    Today's Vitals   03/14/23 1121  Weight: 152 lb (68.9 kg)  Height: 5\' 1"  (1.549 m)   Body mass index is 28.72 kg/m.     03/14/2023   11:35 AM 10/18/2022    9:42 AM 01/26/2021    7:30 AM 06/23/2020    9:23 AM 06/07/2019   11:28 AM 05/18/2019   11:20 AM 04/17/2019   10:05 PM  Advanced Directives  Does Patient Have a Medical Advance Directive? Yes Yes No Yes No Yes No  Type of Estate agent of [place];Living will Healthcare Power of [place];Living will       Does patient want to make changes to medical advance directive?    No - Patient declined     Copy of Healthcare Power of Attorney in Chart? No - copy requested        Would patient like information on creating a medical advance directive?   No - Patient declined  No - Patient declined  No - Patient declined    Current Medications (verified) Outpatient Encounter Medications as of 03/14/2023  Medication Sig   aspirin EC 81 MG tablet Take 81 mg by mouth daily.   clotrimazole (LOTRIMIN) 1 % cream Apply 1 Application topically 2 (two) times daily.   Ferrous Sulfate 27 MG TABS Take by mouth.   indapamide (LOZOL) 1.25 MG tablet Take 1.25 mg by mouth daily.   meclizine  (ANTIVERT) 12.5 MG tablet Take 1 tablet (12.5 mg total) by mouth 3 (three) times daily as needed for dizziness.   metFORMIN (GLUCOPHAGE) 1000 MG tablet TAKE 1 TABLET(1000 MG) BY MOUTH DAILY   Multiple Vitamin (MULTIVITAMIN WITH MINERALS) TABS tablet Take 1 tablet by mouth daily.   olmesartan (BENICAR) 5 MG tablet Take 3 tablets (15 mg total) by mouth 2 (two) times daily.   omeprazole (PRILOSEC) 40 MG capsule Take 1 capsule (40 mg total) by mouth daily before breakfast.   triamcinolone cream (KENALOG) 0.1 % Apply 1 Application topically 2 (two) times daily. As needed for eczema.   saccharomyces boulardii (FLORASTOR) 250 MG capsule Take 1 capsule (250 mg total) by mouth daily. (Patient not taking: Reported on 03/14/2023)   No facility-administered encounter medications on file as of 03/14/2023.    Allergies (verified) Coreg [carvedilol], Hctz [hydrochlorothiazide], Januvia [sitagliptin], Promethazine, Sulfa antibiotics, and Losartan   History: Past Medical History:  Diagnosis Date   Anemia    CKD (chronic kidney disease)    COVID-19    06/18/2021 [facility]   Diabetes mellitus without complication (HCC)    Glaucoma    [facility] with macular edema and Dr. Frederico Hamman sees Q3-4 months   Hypertension    Past Surgical History:  Procedure Laterality Date   BIOPSY THYROID     CESAREAN  SECTION     1981 1988   COLONOSCOPY WITH PROPOFOL N/A 01/26/2021   Procedure: COLONOSCOPY WITH PROPOFOL;  Surgeon: [name], MD;  Location: [facility] ENDOSCOPY;  Service: Endoscopy;  Laterality: N/A;   COLONOSCOPY WITH PROPOFOL N/A 10/18/2022   Procedure: COLONOSCOPY WITH PROPOFOL;  Surgeon: Toney Reil, MD;  Location: [facility] ENDOSCOPY;  Service: Gastroenterology;  Laterality: N/A;   ESOPHAGOGASTRODUODENOSCOPY (EGD) WITH PROPOFOL N/A 01/26/2021   Procedure: ESOPHAGOGASTRODUODENOSCOPY (EGD) WITH PROPOFOL;  Surgeon: [name], MD;  Location: [facility] ENDOSCOPY;  Service: Endoscopy;  Laterality: N/A;    TUBAL LIGATION     Family History  Problem Relation Age of Onset   Diabetes Mother    Heart failure Mother    Diabetes Sister    Healthy Sister    Healthy Sister    Healthy Sister    Diabetes Brother    Diabetes Sister    Heart failure Father    Brain cancer Brother    Throat cancer Brother    Glaucoma Other        Runs on Paternal Side   Breast cancer Cousin    Social History   Socioeconomic History   Marital status: Widowed    Spouse name: Not on file   Number of children: 2   Years of education: College   Highest education level: Not on file  Occupational History   Occupation: Retired 2014    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 [employer]  Tobacco Use   Smoking status: Never   Smokeless tobacco: Never  Vaping Use   Vaping status: Never Used  Substance and Sexual Activity   Alcohol use: No   Drug use: No   Sexual activity: Not Currently    Birth control/protection: None  Other Topics Concern   Not on file  Social History Narrative   Lives at home    Social Determinants of Health   Financial Resource Strain: Low Risk  (03/14/2023)   Overall Financial Resource Strain (CARDIA)    Difficulty of Paying Living Expenses: Not hard at all  Food Insecurity: No Food Insecurity (03/14/2023)   Hunger Vital Sign    Worried About Running Out of Food in the Last Year: Never true    Ran Out of Food in the Last Year: Never true  Transportation Needs: No Transportation Needs (03/14/2023)   PRAPARE - Administrator, Civil Service (Medical): No    Lack of Transportation (Non-Medical): No  Physical Activity: Sufficiently Active (03/14/2023)   Exercise Vital Sign    Days of Exercise per Week: 5 days    Minutes of Exercise per Session: 30 min  Stress: No Stress Concern Present (03/14/2023)   [employer] of Occupational Health - Occupational Stress Questionnaire    Feeling of Stress : Not at all  Social Connections: Moderately Integrated (03/14/2023)   Social Connection and  Isolation Panel [NHANES]    Frequency of Communication with Friends and Family: More than three times a week    Frequency of Social Gatherings with Friends and Family: More than three times a week    Attends Religious Services: More than 4 times per year    Active Member of [employer] or Organizations: Yes    Attends Banker Meetings: More than 4 times per year    Marital Status: Widowed    Tobacco Counseling Counseling given: Not Answered   Clinical Intake:  Pre-visit preparation completed: Yes  Pain : No/denies pain     BMI - recorded: 28.72  Nutritional Status: BMI 25 -29 Overweight Nutritional Risks: None Diabetes: Yes CBG done?: No Did pt. bring in CBG monitor from home?: No  How often do you need to have someone help you when you read instructions, pamphlets, or other written materials from your doctor or pharmacy?: 1 - Never  Interpreter Needed?: No  Information entered by :: R. Berlin Viereck LPN   Activities of Daily Living    03/14/2023   11:24 AM 12/10/2022    8:51 AM  In your present state of health, do you have any difficulty performing the following activities:  Hearing? 0 1  Vision? 0 1  Comment readers wears glasses when reading  Difficulty concentrating or making decisions? 0 0  Walking or climbing stairs? 0 0  Dressing or bathing? 0 0  Doing errands, shopping? 0 0  Preparing Food and eating ? N   Using the Toilet? N   In the past six months, have you accidently leaked urine? N   Do you have problems with loss of bowel control? N   Managing your Medications? N   Managing your Finances? N   Housekeeping or managing your Housekeeping? N     Patient Care Team: Dana Allan, MD as PCP - General (Family Medicine) Vena Austria, MD as Referring Physician (Obstetrics and Gynecology)  Indicate any recent Medical Services you may have received from other than [facility] providers in the past year (date may be approximate).     Assessment:   This is a  routine wellness examination for [place].  Hearing/Vision screen Hearing Screening - Comments:: No issues Vision Screening - Comments:: readers   Goals Addressed             This Visit's Progress    Patient Stated       Wants to try quit snacking on unhealthy foods       Depression Screen    03/14/2023   11:30 AM 12/10/2022    8:50 AM 10/21/2022    2:01 PM 08/13/2022    3:47 PM 06/11/2022   10:21 AM 03/21/2022    9:21 AM 01/24/2022   11:01 AM  PHQ 2/9 Scores  PHQ - 2 Score 0 0 0 0 0 0 0  PHQ- 9 Score 0 0         Fall Risk    03/14/2023   11:25 AM 10/21/2022    2:01 PM 08/13/2022    3:47 PM 06/11/2022   10:21 AM 03/21/2022    9:21 AM  Fall Risk   Falls in the past year? 0 0 0 0 0  Number falls in past yr: 0 0 0 0 0  Injury with Fall? 0 0 0 0 0  Risk for fall due to : No Fall Risks No Fall Risks No Fall Risks No Fall Risks No Fall Risks  Follow up Falls prevention discussed;Falls evaluation completed Falls evaluation completed Falls evaluation completed Falls evaluation completed Falls evaluation completed    MEDICARE RISK AT HOME: Medicare Risk at Home Any stairs in or around the home?: No If so, are there any without handrails?: No Home free of loose throw rugs in walkways, pet beds, electrical cords, etc?: Yes Adequate lighting in your home to reduce risk of falls?: Yes Life alert?: No Use of a cane, walker or w/c?: No Grab bars in the bathroom?: Yes Shower chair or bench in shower?: No Elevated toilet seat or a handicapped toilet?: Yes      Cognitive Function:  03/14/2023   11:36 AM 12/10/2022    8:50 AM 06/07/2019   11:37 AM  6CIT Screen  What Year? 0 points 0 points 0 points  What month? 0 points 0 points 0 points  What time? 0 points  0 points  Count back from 20 0 points  0 points  Months in reverse 0 points  0 points  Repeat phrase 0 points  0 points  Total Score 0 points  0 points    Immunizations Immunization History  Administered Date(s)  Administered   Hepatitis B 09/20/2011, 11/01/2011, 04/03/2012   Hepatitis B, ADULT 09/20/2011, 11/01/2011, 04/03/2012   Moderna Sars-Covid-2 Vaccination 10/19/2019, 11/16/2019, 05/25/2020   Pneumococcal Conjugate-13 03/12/2018   Pneumococcal Polysaccharide-23 05/19/2013, 05/19/2013   Tdap 02/07/2012    TDAP status: Due, Education has been provided regarding the importance of this vaccine. Advised may receive this vaccine at local pharmacy or Health Dept. Aware to provide a copy of the vaccination record if obtained from local pharmacy or Health Dept. Verbalized acceptance and understanding.  Flu Vaccine status: Due, Education has been provided regarding the importance of this vaccine. Advised may receive this vaccine at local pharmacy or Health Dept. Aware to provide a copy of the vaccination record if obtained from local pharmacy or Health Dept. Verbalized acceptance and understanding.  Pneumococcal vaccine status: Up to date  Covid-19 vaccine status: Completed vaccines  Qualifies for Shingles Vaccine? Yes   Zostavax completed No   Shingrix Completed?: No.    Education has been provided regarding the importance of this vaccine. Patient has been advised to call insurance company to determine out of pocket expense if they have not yet received this vaccine. Advised may also receive vaccine at local pharmacy or Health Dept. Verbalized acceptance and understanding.  Screening Tests Health Maintenance  Topic Date Due   Zoster Vaccines- Shingrix (1 of 2) Never done   Medicare Annual Wellness (AWV)  06/23/2021   OPHTHALMOLOGY EXAM  11/28/2022   INFLUENZA VACCINE  Never done   COVID-19 Vaccine (4 - 2023-24 season) 03/09/2023   HEMOGLOBIN A1C  05/17/2023   Diabetic kidney evaluation - eGFR measurement  11/14/2023   Diabetic kidney evaluation - Urine ACR  11/14/2023   MAMMOGRAM  11/28/2023   Colonoscopy  10/17/2029   DEXA SCAN  Completed   Hepatitis C Screening  Completed   HPV VACCINES   Aged Out   DTaP/Tdap/Td  Discontinued   Pneumonia Vaccine 79+ Years old  Discontinued   FOOT EXAM  Discontinued    Health Maintenance  Health Maintenance Due  Topic Date Due   Zoster Vaccines- Shingrix (1 of 2) Never done   Medicare Annual Wellness (AWV)  06/23/2021   OPHTHALMOLOGY EXAM  11/28/2022   INFLUENZA VACCINE  Never done   COVID-19 Vaccine (4 - 2023-24 season) 03/09/2023    Colorectal cancer screening: Type of screening: Colonoscopy. Completed 4/24. Repeat every 7 years  Mammogram status: Completed 5/24. Repeat every year  Bone Density status: Completed 2/22. Results reflect: Bone density results: NORMAL. Repeat every 2 years.  Lung Cancer Screening: (Low Dose CT Chest recommended if Age 5-80 years, 20 pack-year currently smoking OR have quit w/in 15years.) does not qualify.     Additional Screening:  Hepatitis C Screening: does qualify; Completed 1/21  Vision Screening: Recommended annual ophthalmology exams for early detection of glaucoma and other disorders of the eye. Is the patient up to date with their annual eye exam?  Yes  Who is the provider or what is  the name of the office in which the patient attends annual eye exams? [facility] Patient has an appointment 11/24 If pt is not established with a provider, would they like to be referred to a provider to establish care? No .   Dental Screening: Recommended annual dental exams for proper oral hygiene  Diabetic Foot Exam: Diabetic Foot Exam: Completed 2/21 see notes  Community Resource Referral / Chronic Care Management: CRR required this visit?  No   CCM required this visit?  No     Plan:     I have personally reviewed and noted the following in the patient's chart:   Medical and social history Use of alcohol, tobacco or illicit drugs  Current medications and supplements including opioid prescriptions. Patient is not currently taking opioid prescriptions. Functional ability and  status Nutritional status Physical activity Advanced directives List of other physicians Hospitalizations, surgeries, and ER visits in previous 12 months Vitals Screenings to include cognitive, depression, and falls Referrals and appointments  In addition, I have reviewed and discussed with patient certain preventive protocols, quality metrics, and best practice recommendations. A written personalized care plan for preventive services as well as general preventive health recommendations were provided to patient.     Sydell Axon, LPN   01/13/4695   After Visit Summary: (MyChart) Due to this being a telephonic visit, the after visit summary with patients personalized plan was offered to patient via MyChart   Nurse Notes: None

## 2023-03-15 ENCOUNTER — Other Ambulatory Visit: Payer: Self-pay | Admitting: Nurse Practitioner

## 2023-03-15 ENCOUNTER — Other Ambulatory Visit: Payer: Self-pay | Admitting: Family

## 2023-03-15 DIAGNOSIS — L209 Atopic dermatitis, unspecified: Secondary | ICD-10-CM

## 2023-04-07 ENCOUNTER — Encounter: Payer: Self-pay | Admitting: Family Medicine

## 2023-04-07 ENCOUNTER — Ambulatory Visit: Payer: Medicare Other | Admitting: Family Medicine

## 2023-04-07 VITALS — BP 136/68 | HR 84 | Temp 97.7°F | Resp 16 | Ht 61.0 in | Wt 155.5 lb

## 2023-04-07 DIAGNOSIS — D509 Iron deficiency anemia, unspecified: Secondary | ICD-10-CM | POA: Diagnosis not present

## 2023-04-07 DIAGNOSIS — E118 Type 2 diabetes mellitus with unspecified complications: Secondary | ICD-10-CM | POA: Diagnosis not present

## 2023-04-07 DIAGNOSIS — Z23 Encounter for immunization: Secondary | ICD-10-CM

## 2023-04-07 DIAGNOSIS — H6692 Otitis media, unspecified, left ear: Secondary | ICD-10-CM

## 2023-04-07 DIAGNOSIS — E1159 Type 2 diabetes mellitus with other circulatory complications: Secondary | ICD-10-CM

## 2023-04-07 DIAGNOSIS — M1990 Unspecified osteoarthritis, unspecified site: Secondary | ICD-10-CM | POA: Diagnosis not present

## 2023-04-07 DIAGNOSIS — B372 Candidiasis of skin and nail: Secondary | ICD-10-CM

## 2023-04-07 DIAGNOSIS — I152 Hypertension secondary to endocrine disorders: Secondary | ICD-10-CM

## 2023-04-07 DIAGNOSIS — N1831 Chronic kidney disease, stage 3a: Secondary | ICD-10-CM | POA: Diagnosis not present

## 2023-04-07 DIAGNOSIS — H6993 Unspecified Eustachian tube disorder, bilateral: Secondary | ICD-10-CM | POA: Diagnosis not present

## 2023-04-07 DIAGNOSIS — E559 Vitamin D deficiency, unspecified: Secondary | ICD-10-CM | POA: Diagnosis not present

## 2023-04-07 DIAGNOSIS — R600 Localized edema: Secondary | ICD-10-CM

## 2023-04-07 DIAGNOSIS — R768 Other specified abnormal immunological findings in serum: Secondary | ICD-10-CM

## 2023-04-07 MED ORDER — CLOTRIMAZOLE 1 % EX CREA
1.0000 | TOPICAL_CREAM | Freq: Two times a day (BID) | CUTANEOUS | 1 refills | Status: DC
Start: 2023-04-07 — End: 2023-12-15

## 2023-04-07 MED ORDER — SACCHAROMYCES BOULARDII 250 MG PO CAPS: 250.0000 mg | ORAL_CAPSULE | Freq: Every day | ORAL | 0 refills | Status: DC

## 2023-04-07 NOTE — Patient Instructions (Addendum)
It was a pleasure meeting you today. Thank you for allowing me to take part in your health care.  Our goals for today as we discussed include:  We will get some labs today.  If they are abnormal or we need to do something about them, I will call you.  If they are normal, I will send you a message on MyChart (if it is active) or a letter in the mail.  If you don't hear from [place] in 2 weeks, please call the office at the number below.   Flu vaccine today  Refills sent for requested medication  Follow up with Cardiology as scheduled  Follow up with Nephrology as scheduled   Follow up as needed  If you have any questions or concerns, please do not hesitate to call the office at [phone].  I look forward to our next visit and until then take care and stay safe.  Regards,   Dana Allan, MD   [facility]

## 2023-04-07 NOTE — Progress Notes (Signed)
SUBJECTIVE:   Chief Complaint  Patient presents with   Medical Management of Chronic Issues    Inflammation feeling heavy all over the body but worse on the lower part of the body. Been going on about 2 weeks   HPI Patients presents for visit  Discussed the use of AI scribe software for clinical note transcription with the patient, who gave verbal consent to proceed.  History of Present Illness The patient, with a history of inflammation diagnosed a few years ago via MRI, presents with a sensation of heaviness in the body, particularly in the lower extremities. They describe the feeling as different from previous experiences, likening it to a sensation of floating. The patient has been managing the inflammation with exercise, a diet of non-inflammatory foods, and hydration. They deny joint pain and fevers but report occasional cramps in the back of the leg.  The patient also reports a sensation of tightness in the arms and a feeling of being "woozy," which they differentiate from their previous experiences with vertigo. This dizziness is sporadic and was particularly noticeable when getting up from a seated position at a recent dentist appointment.  The patient also mentions a single episode of heart palpitations that occurred last week. They have a history of seeing a cardiologist, with the most recent visit in May. The patient has been eating out more frequently than usual but tries to stick to healthy options like fish and vegetables.  The patient also has carpal tunnel syndrome in the right wrist and wears a brace, which they report may be contributing to a feeling of increased swelling in the arm. They also wear compression stockings for their legs and have noticed a slight increase in swelling in both leg. The patient reports normal bowel and urinary function.  The onset of these symptoms was approximately two weeks ago, and the patient speculates that increased exercise may have  contributed. The patient also received a tetanus shot on the tenth of September and reports that some of the symptoms, particularly the wooziness, may have started after the shot.    PERTINENT PMH / PSH: As above  OBJECTIVE:  BP 136/68   Pulse 84   Temp 97.7 F (36.5 C)   Resp 16   Ht 5\' 1"  (1.549 m)   Wt 155 lb 8 oz (70.5 kg)   SpO2 100%   BMI 29.38 kg/m    Physical Exam Vitals reviewed.  Constitutional:      General: She is not in acute distress.    Appearance: Normal appearance. She is not ill-appearing or toxic-appearing.  HENT:     Head: Normocephalic.     Right Ear: Tympanic membrane, ear canal and external ear normal.     Left Ear: Tympanic membrane, ear canal and external ear normal.     Nose: Nose normal.     Mouth/Throat:     Mouth: Mucous membranes are moist.  Eyes:     Extraocular Movements: Extraocular movements intact.     Conjunctiva/sclera: Conjunctivae normal.     Pupils: Pupils are equal, round, and reactive to light.  Neck:     Thyroid: No thyromegaly or thyroid tenderness.     Vascular: No carotid bruit.  Cardiovascular:     Rate and Rhythm: Normal rate and regular rhythm.     Pulses: Normal pulses.     Heart sounds: Normal heart sounds. No murmur heard. Pulmonary:     Effort: Pulmonary effort is normal.     Breath  sounds: Normal breath sounds. No wheezing or rhonchi.  Abdominal:     General: Bowel sounds are normal. There is no distension.     Palpations: Abdomen is soft.     Tenderness: There is no abdominal tenderness. There is no right CVA tenderness, left CVA tenderness, guarding or rebound.  Musculoskeletal:        General: Normal range of motion.     Cervical back: Normal range of motion.     Right lower leg: No edema (trace).     Left lower leg: No edema (trace).  Lymphadenopathy:     Cervical: No cervical adenopathy.  Skin:    Capillary Refill: Capillary refill takes less than 2 seconds.  Neurological:     General: No focal  deficit present.     Mental Status: She is alert and oriented to person, place, and time. Mental status is at baseline.     Motor: No weakness.  Psychiatric:        Mood and Affect: Mood normal.        Behavior: Behavior normal.        Thought Content: Thought content normal.        Judgment: Judgment normal.        04/07/2023    2:49 PM 03/14/2023   11:30 AM 12/10/2022    8:50 AM 10/21/2022    2:01 PM 08/13/2022    3:47 PM  Depression screen PHQ 2/9  Decreased Interest 0 0 0 0 0  Down, Depressed, Hopeless 0 0 0 0 0  PHQ - 2 Score 0 0 0 0 0  Altered sleeping 0 0 0    Tired, decreased energy 0 0 0    Change in appetite 0 0 0    Feeling bad or failure about yourself  0 0 0    Trouble concentrating 0 0 0    Moving slowly or fidgety/restless 0 0 0    Suicidal thoughts 0 0 0    PHQ-9 Score 0 0 0    Difficult doing work/chores  Not difficult at all         04/07/2023    2:49 PM 10/21/2022    2:01 PM 08/13/2022    3:47 PM 06/12/2020   10:40 AM  GAD 7 : Generalized Anxiety Score  Nervous, Anxious, on Edge 0 0 0 0  Control/stop worrying 0 0 0 0  Worry too much - different things 0 0 0 0  Trouble relaxing 0 0 0 0  Restless 0 0 0 0  Easily annoyed or irritable 0 0 0 0  Afraid - awful might happen 0 0 0 0  Total GAD 7 Score 0 0 0 0  Anxiety Difficulty Not difficult at all Not difficult at all Not difficult at all Not difficult at all    ASSESSMENT/PLAN:  Disorder of both eustachian tubes Assessment & Plan: Patient reports sporadic episodes of dizziness, described as "woozy", different from previous vertigo episodes. -Advise patient to resume Meclizine as needed for dizziness.   Hypertension associated with diabetes (HCC) -     Comprehensive metabolic panel  Type 2 diabetes mellitus with complications (HCC) -     Hemoglobin A1c -     Vitamin B12 -     TSH  Stage 3a chronic kidney disease (HCC) -     Comprehensive metabolic panel  Avitaminosis D -     VITAMIN D 25 Hydroxy  (Vit-D Deficiency, Fractures)  Iron deficiency anemia, unspecified iron deficiency anemia  type -     CBC -     Iron, TIBC and Ferritin Panel  Arthritis -     C-reactive protein -     Sedimentation rate  Elevated antinuclear antibody (ANA) level -     ANA  Need for influenza vaccination -     Flu Vaccine Trivalent High Dose (Fluad)  Leg edema Assessment & Plan: Patient reports a sensation of heaviness in both upper and lower extremities, with a history of inflammation diagnosed by MRI. No joint pain, fever, or significant swelling. Patient reports eating out more frequently recently. Weight increased 5lbs since last visit. Low suspicion for cardiac involvement given no cardiac symptoms. -Order blood work to check hemoglobin, kidney function, electrolytes, and inflammatory markers. -Advise patient to avoid high sodium foods and to limit eating out. -Review neurology notes for further information on previous inflammation diagnosis.   Candidal intertrigo Assessment & Plan: Refill Clotrimazole    Orders: -     Clotrimazole; Apply 1 Application topically 2 (two) times daily.  Dispense: 30 g; Refill: 1  Other orders -     Saccharomyces boulardii; Take 1 capsule (250 mg total) by mouth daily.  Dispense: 90 capsule; Refill: 0         Influenza Vaccination Patient has not received the current season's influenza vaccine. -Administer influenza vaccine today.      Follow-up Pending lab results and patient's symptom progression. -Ask patient to message via chart if symptoms worsen. -Review lab results in 1-2 days and communicate plan based on results.    PDMP reviewed  Return if symptoms worsen or fail to improve, for PCP.  Dana Allan, MD

## 2023-04-08 LAB — CBC
HCT: 34 % — ABNORMAL LOW (ref 36.0–46.0)
Hemoglobin: 11.3 g/dL — ABNORMAL LOW (ref 12.0–15.0)
MCHC: 33.2 g/dL (ref 30.0–36.0)
MCV: 90 fL (ref 78.0–100.0)
Platelets: 302 10*3/uL (ref 150.0–400.0)
RBC: 3.78 Mil/uL — ABNORMAL LOW (ref 3.87–5.11)
RDW: 13 % (ref 11.5–15.5)
WBC: 5.7 10*3/uL (ref 4.0–10.5)

## 2023-04-08 LAB — COMPREHENSIVE METABOLIC PANEL
ALT: 12 U/L (ref 0–35)
AST: 18 U/L (ref 0–37)
Albumin: 4.1 g/dL (ref 3.5–5.2)
Alkaline Phosphatase: 73 U/L (ref 39–117)
BUN: 20 mg/dL (ref 6–23)
CO2: 30 meq/L (ref 19–32)
Calcium: 10.4 mg/dL (ref 8.4–10.5)
Chloride: 98 meq/L (ref 96–112)
Creatinine, Ser: 1.15 mg/dL (ref 0.40–1.20)
GFR: 48.25 mL/min — ABNORMAL LOW (ref >60.00)
Glucose, Bld: 105 mg/dL — ABNORMAL HIGH (ref 70–99)
Potassium: 4.3 meq/L (ref 3.5–5.1)
Sodium: 135 meq/L (ref 135–145)
Total Bilirubin: 0.3 mg/dL (ref 0.2–1.2)
Total Protein: 7.5 g/dL (ref 6.0–8.3)

## 2023-04-08 LAB — VITAMIN D 25 HYDROXY (VIT D DEFICIENCY, FRACTURES): VITD: 53.1 ng/mL (ref 30.00–100.00)

## 2023-04-08 LAB — TSH: TSH: 0.82 u[IU]/mL (ref 0.35–5.50)

## 2023-04-08 LAB — C-REACTIVE PROTEIN: CRP: <1 mg/dL (ref 0.5–20.0)

## 2023-04-08 LAB — SEDIMENTATION RATE: Sed Rate: 18 mm/h (ref 0–30)

## 2023-04-08 LAB — HEMOGLOBIN A1C: Hgb A1c MFr Bld: 6.6 % — ABNORMAL HIGH (ref 4.6–6.5)

## 2023-04-08 LAB — VITAMIN B12: Vitamin B-12: 663 pg/mL (ref 211–911)

## 2023-04-09 LAB — IRON,TIBC AND FERRITIN PANEL
%SAT: 27 % (ref 16–45)
Ferritin: 37 ng/mL (ref 16–288)
Iron: 86 ug/dL (ref 45–160)
TIBC: 314 ug/dL (ref 250–450)

## 2023-04-09 LAB — ANA: Anti Nuclear Antibody (ANA): NEGATIVE

## 2023-04-16 ENCOUNTER — Encounter: Payer: Self-pay | Admitting: Family Medicine

## 2023-04-16 DIAGNOSIS — R768 Other specified abnormal immunological findings in serum: Secondary | ICD-10-CM | POA: Insufficient documentation

## 2023-04-16 DIAGNOSIS — Z23 Encounter for immunization: Secondary | ICD-10-CM | POA: Insufficient documentation

## 2023-04-16 DIAGNOSIS — H6990 Unspecified Eustachian tube disorder, unspecified ear: Secondary | ICD-10-CM | POA: Insufficient documentation

## 2023-04-16 NOTE — Assessment & Plan Note (Signed)
Patient reports a sensation of heaviness in both upper and lower extremities, with a history of inflammation diagnosed by MRI. No joint pain, fever, or significant swelling. Patient reports eating out more frequently recently. Weight increased 5lbs since last visit. Low suspicion for cardiac involvement given no cardiac symptoms. -Order blood work to check hemoglobin, kidney function, electrolytes, and inflammatory markers. -Advise patient to avoid high sodium foods and to limit eating out. -Review neurology notes for further information on previous inflammation diagnosis.

## 2023-04-16 NOTE — Assessment & Plan Note (Signed)
Patient reports sporadic episodes of dizziness, described as "woozy", different from previous vertigo episodes. -Advise patient to resume Meclizine as needed for dizziness.

## 2023-04-16 NOTE — Assessment & Plan Note (Signed)
Refill Clotrimazole

## 2023-04-17 ENCOUNTER — Encounter: Payer: Self-pay | Admitting: *Deleted

## 2023-04-17 ENCOUNTER — Telehealth: Payer: Self-pay | Admitting: Family Medicine

## 2023-04-17 NOTE — Telephone Encounter (Signed)
Called and spoke to pt about lab results. 

## 2023-04-17 NOTE — Telephone Encounter (Signed)
Pt returning call

## 2023-04-22 DIAGNOSIS — I10 Essential (primary) hypertension: Secondary | ICD-10-CM | POA: Diagnosis not present

## 2023-04-22 DIAGNOSIS — N182 Chronic kidney disease, stage 2 (mild): Secondary | ICD-10-CM | POA: Diagnosis not present

## 2023-04-24 DIAGNOSIS — Z23 Encounter for immunization: Secondary | ICD-10-CM | POA: Diagnosis not present

## 2023-04-29 ENCOUNTER — Other Ambulatory Visit: Payer: Self-pay | Admitting: Gastroenterology

## 2023-04-29 DIAGNOSIS — E119 Type 2 diabetes mellitus without complications: Secondary | ICD-10-CM | POA: Diagnosis not present

## 2023-04-29 DIAGNOSIS — I10 Essential (primary) hypertension: Secondary | ICD-10-CM | POA: Diagnosis not present

## 2023-04-29 DIAGNOSIS — N1831 Chronic kidney disease, stage 3a: Secondary | ICD-10-CM | POA: Diagnosis not present

## 2023-04-29 NOTE — Telephone Encounter (Signed)
Last office visit 12/28/2021

## 2023-05-01 DIAGNOSIS — H04123 Dry eye syndrome of bilateral lacrimal glands: Secondary | ICD-10-CM | POA: Diagnosis not present

## 2023-05-13 ENCOUNTER — Telehealth: Payer: Self-pay

## 2023-05-17 ENCOUNTER — Other Ambulatory Visit: Payer: Self-pay | Admitting: Family Medicine

## 2023-05-17 DIAGNOSIS — B009 Herpesviral infection, unspecified: Secondary | ICD-10-CM

## 2023-05-17 MED ORDER — VALACYCLOVIR HCL 1 G PO TABS: 1000.0000 mg | ORAL_TABLET | Freq: Two times a day (BID) | ORAL | 0 refills | Status: AC

## 2023-05-20 NOTE — Telephone Encounter (Signed)
RX sent in for pt  

## 2023-06-02 ENCOUNTER — Other Ambulatory Visit: Payer: Self-pay | Admitting: Family Medicine

## 2023-06-02 DIAGNOSIS — L209 Atopic dermatitis, unspecified: Secondary | ICD-10-CM

## 2023-06-17 DIAGNOSIS — H40053 Ocular hypertension, bilateral: Secondary | ICD-10-CM | POA: Diagnosis not present

## 2023-06-17 DIAGNOSIS — E113413 Type 2 diabetes mellitus with severe nonproliferative diabetic retinopathy with macular edema, bilateral: Secondary | ICD-10-CM | POA: Diagnosis not present

## 2023-06-17 DIAGNOSIS — H2513 Age-related nuclear cataract, bilateral: Secondary | ICD-10-CM | POA: Diagnosis not present

## 2023-06-17 LAB — HM DIABETES EYE EXAM

## 2023-06-19 ENCOUNTER — Telehealth: Payer: Self-pay | Admitting: Family Medicine

## 2023-06-19 ENCOUNTER — Other Ambulatory Visit: Payer: Self-pay

## 2023-06-19 NOTE — Telephone Encounter (Signed)
Called pt medication not on current list. She stated that she needs for Hemorrhoid. She tired to fill though the pharmacy, but it said that the medication has expired.

## 2023-06-19 NOTE — Telephone Encounter (Signed)
Prescription Request  06/19/2023  LOV: 04/07/2023  What is the name of the medication or equipment? hydrocortisone  Have you contacted your pharmacy to request a refill? No   Which pharmacy would you like this sent to?  [id number] [place], [place] - [address] AT [facility] [address] [place] [address] Phone: [phone] Fax: [phone]    Patient notified that their request is being sent to the clinical staff for review and that they should receive a response within 2 business days.   Please advise at Mobile [phone] (mobile)

## 2023-06-19 NOTE — Telephone Encounter (Signed)
Left message to return call to our office.  Need more information about the medication pt is requesting.

## 2023-06-20 ENCOUNTER — Other Ambulatory Visit: Payer: Self-pay | Admitting: Family Medicine

## 2023-06-20 DIAGNOSIS — K649 Unspecified hemorrhoids: Secondary | ICD-10-CM

## 2023-06-20 MED ORDER — HYDROCORTISONE (PERIANAL) 2.5 % EX CREA
1.0000 | TOPICAL_CREAM | Freq: Three times a day (TID) | CUTANEOUS | 11 refills | Status: AC
Start: 2023-06-20 — End: ?

## 2023-06-20 NOTE — Telephone Encounter (Signed)
Left a vm letting the pt know that the rx she requested has been sent in.

## 2023-07-25 ENCOUNTER — Other Ambulatory Visit: Payer: Self-pay | Admitting: Family Medicine

## 2023-07-25 DIAGNOSIS — E119 Type 2 diabetes mellitus without complications: Secondary | ICD-10-CM

## 2023-09-23 ENCOUNTER — Encounter: Payer: Self-pay | Admitting: Family Medicine

## 2023-09-23 ENCOUNTER — Ambulatory Visit: Admitting: Family Medicine

## 2023-09-23 VITALS — BP 150/80 | HR 88 | Temp 97.9°F | Resp 20 | Ht 67.0 in | Wt 164.5 lb

## 2023-09-23 DIAGNOSIS — R0982 Postnasal drip: Secondary | ICD-10-CM

## 2023-09-23 DIAGNOSIS — E1159 Type 2 diabetes mellitus with other circulatory complications: Secondary | ICD-10-CM

## 2023-09-23 DIAGNOSIS — R051 Acute cough: Secondary | ICD-10-CM | POA: Diagnosis not present

## 2023-09-23 DIAGNOSIS — Z7984 Long term (current) use of oral hypoglycemic drugs: Secondary | ICD-10-CM

## 2023-09-23 DIAGNOSIS — I152 Hypertension secondary to endocrine disorders: Secondary | ICD-10-CM | POA: Diagnosis not present

## 2023-09-23 DIAGNOSIS — E119 Type 2 diabetes mellitus without complications: Secondary | ICD-10-CM

## 2023-09-23 DIAGNOSIS — I129 Hypertensive chronic kidney disease with stage 1 through stage 4 chronic kidney disease, or unspecified chronic kidney disease: Secondary | ICD-10-CM

## 2023-09-23 MED ORDER — BENZONATATE 200 MG PO CAPS: 200.0000 mg | ORAL_CAPSULE | Freq: Two times a day (BID) | ORAL | 0 refills | Status: DC | PRN

## 2023-09-23 MED ORDER — FLUTICASONE PROPIONATE 50 MCG/ACT NA SUSP: 2.0000 | Freq: Two times a day (BID) | NASAL | 6 refills | Status: AC

## 2023-09-23 MED ORDER — AZELASTINE HCL 0.1 % NA SOLN: 2.0000 | Freq: Two times a day (BID) | NASAL | 12 refills | Status: AC

## 2023-09-23 NOTE — Progress Notes (Unsigned)
 SUBJECTIVE:   Chief Complaint  Patient presents with   Sore Throat    Friday of last week both ear   Ear Pain    Friday last week   HPI Presents for acute visit  Discussed the use of AI scribe software for clinical note transcription with the patient, who gave verbal consent to proceed.  History of Present Illness Natasha Phillips is a 73 year old female who presents with ear and throat symptoms and a cough.  She has been experiencing ear and throat symptoms along with a cough that began last Thursday or Friday. No fever, decrease in hearing, or significant ear drainage is present, but there is mild pain in both ears. She also has throat drainage and rhinorrhea, which she manages by blowing her nose.  The cough is productive and described as 'kind of thick,' though she is unsure if it originates from her lungs or throat. No wheezing, shortness of breath, or chest pain is present, although she felt heaviness in her chest one day, which she attributes to indigestion. Swallowing does not cause pain, and she is eating and drinking normally.  She has been in contact with her seven-month-old granddaughter who was recently ill with RSV, and she believes she may have contracted something from her.  For symptom relief, she has been taking Theraflu, Cortane, cough drops, Mucinex, Ricola, and Halls, which she feels have been somewhat helpful. She also uses Flonase spray and occasionally takes Claritin for allergies.  She mentions her blood pressure is high and attributes it to consuming canned soups and other foods that may not be suitable for her condition. She did not take her blood pressure medication just before the visit.      PERTINENT PMH / PSH: As above  OBJECTIVE:  BP (!) 150/80   Pulse 88   Temp 97.9 F (36.6 C)   Resp 20   Ht 5\' 7"  (1.702 m)   Wt 164 lb 8 oz (74.6 kg)   SpO2 97%   BMI 25.76 kg/m    Physical Exam Vitals reviewed.  Constitutional:      General: She is  not in acute distress.    Appearance: Normal appearance. She is normal weight. She is not ill-appearing, toxic-appearing or diaphoretic.  Eyes:     General:        Right eye: No discharge.        Left eye: No discharge.     Conjunctiva/sclera: Conjunctivae normal.  Cardiovascular:     Rate and Rhythm: Normal rate and regular rhythm.     Heart sounds: Normal heart sounds.  Pulmonary:     Effort: Pulmonary effort is normal.     Breath sounds: Normal breath sounds.  Abdominal:     General: Bowel sounds are normal.  Musculoskeletal:        General: Normal range of motion.  Skin:    General: Skin is warm and dry.  Neurological:     General: No focal deficit present.     Mental Status: She is alert and oriented to person, place, and time. Mental status is at baseline.  Psychiatric:        Mood and Affect: Mood normal.        Behavior: Behavior normal.        Thought Content: Thought content normal.        Judgment: Judgment normal.           09/23/2023   11:02 AM 04/07/2023  2:49 PM 03/14/2023   11:30 AM 12/10/2022    8:50 AM 10/21/2022    2:01 PM  Depression screen PHQ 2/9  Decreased Interest 0 0 0 0 0  Down, Depressed, Hopeless 0 0 0 0 0  PHQ - 2 Score 0 0 0 0 0  Altered sleeping 0 0 0 0   Tired, decreased energy 0 0 0 0   Change in appetite 0 0 0 0   Feeling bad or failure about yourself  0 0 0 0   Trouble concentrating 0 0 0 0   Moving slowly or fidgety/restless 0 0 0 0   Suicidal thoughts 0 0 0 0   PHQ-9 Score 0 0 0 0   Difficult doing work/chores Not difficult at all  Not difficult at all        09/23/2023   11:02 AM 04/07/2023    2:49 PM 10/21/2022    2:01 PM 08/13/2022    3:47 PM  GAD 7 : Generalized Anxiety Score  Nervous, Anxious, on Edge 0 0 0 0  Control/stop worrying 0 0 0 0  Worry too much - different things 0 0 0 0  Trouble relaxing 0 0 0 0  Restless 0 0 0 0  Easily annoyed or irritable 0 0 0 0  Afraid - awful might happen 0 0 0 0  Total GAD 7 Score 0  0 0 0  Anxiety Difficulty Not difficult at all Not difficult at all Not difficult at all Not difficult at all    ASSESSMENT/PLAN:  Acute cough Assessment & Plan: Symptoms consistent with viral URI, likely RSV. No bacterial infection. Symptoms improving. - Prescribe Flonase spray, two sprays twice daily. - Continue Claritin or Zyrtec for allergies. - Prescribe Tessalon Perles for cough, caution toxicity risk to children. - Advise symptomatic treatment with cough drops and Mucinex. - Instruct to monitor for fever, worsening cough, chest pain, or wheezing and report if these occur. - Advise on hygiene measures to prevent virus spread.  Orders: -     Benzonatate; Take 1 capsule (200 mg total) by mouth 2 (two) times daily as needed for cough.  Dispense: 20 capsule; Refill: 0  Post-nasal drip Assessment & Plan: - Prescribe Flonase spray, two sprays twice daily. - Continue Claritin or Zyrtec for allergies.   Orders: -     Fluticasone Propionate; Place 2 sprays into both nostrils in the morning and at bedtime.  Dispense: 16 g; Refill: 6 -     Azelastine HCl; Place 2 sprays into both nostrils 2 (two) times daily. Use in each nostril as directed  Dispense: 30 mL; Refill: 12  Hypertension associated with diabetes (HCC) Assessment & Plan: Elevated blood pressure likely due to over-the-counter medications. - Advise discontinuation of non-blood pressure-friendly over-the-counter medications. - Recommend using over-the-counter medications labeled for high blood pressure.  - Currently taking Benicar 15 mg daily and indapamide 1.25 mg daily.  - Follows with cardiology and nephrology      PDMP reviewed  Return in about 4 weeks (around 10/21/2023), or if symptoms worsen or fail to improve, for PCP, DM.  Dana Allan, MD

## 2023-09-23 NOTE — Patient Instructions (Addendum)
 It was a pleasure meeting you today. Thank you for allowing me to take part in your health care.  Our goals for today as we discussed include:   Blood pressure is high today. Likely from taking over the counter medicine for common cold.  Please only use medication that is for patients with High blood pressure.  You can ask the pharmacist to help you find these.  Coricidin high blood pressure is a safe choice   Start Flonase 2 sprays two times a day  Start Astelin 2 sprays two times a day [employer] three times a day as needed for cough  You can take Tylenol and/or Ibuprofen as needed for fever reduction and pain relief.   For cough: honey 1/2 to 1 teaspoon (you can dilute the honey in water or another fluid).  You can also use guaifenesin and dextromethorphan for cough. You can use a humidifier for chest congestion and cough.  If you don't have a humidifier, you can sit in the bathroom with the hot shower running.      For sore throat: try warm salt water gargles, cepacol lozenges, throat spray, warm tea or water with lemon/honey, popsicles or ice, or OTC cold relief medicine for throat discomfort.   For congestion: take a daily anti-histamine like Zyrtec, Claritin, and a oral decongestant, such as pseudoephedrine.  You can also use Flonase 1-2 sprays in each nostril daily.   It is important to stay hydrated: drink plenty of fluids (water, gatorade/powerade/pedialyte, juices, or teas) to keep your throat moisturized and help further relieve irritation/discomfort.   If any worsening symptoms please notify MD   Follow up in 4 weeks for diabetic management  This is a list of the screening recommended for you and due dates:  Health Maintenance  Topic Date Due   Zoster (Shingles) Vaccine (1 of 2) Never done   Eye exam for diabetics  11/28/2022   COVID-19 Vaccine (7 - 2024-25 season) 06/19/2023   Hemoglobin A1C  10/05/2023   Yearly kidney health urinalysis for diabetes   11/14/2023   Mammogram  11/28/2023   Medicare Annual Wellness Visit  03/13/2024   Yearly kidney function blood test for diabetes  04/06/2024   Colon Cancer Screening  10/17/2029   Flu Shot  Completed   DEXA scan (bone density measurement)  Completed   Hepatitis C Screening  Completed   HPV Vaccine  Aged Out   DTaP/Tdap/Td vaccine  Discontinued   Pneumonia Vaccine  Discontinued   Complete foot exam   Discontinued      If you have any questions or concerns, please do not hesitate to call the office at [phone].  I look forward to our next visit and until then take care and stay safe.  Regards,   Dana Allan, MD   [facility] [place] [facility]

## 2023-09-26 ENCOUNTER — Encounter: Payer: Self-pay | Admitting: Family Medicine

## 2023-09-26 DIAGNOSIS — R0982 Postnasal drip: Secondary | ICD-10-CM | POA: Insufficient documentation

## 2023-09-26 DIAGNOSIS — R051 Acute cough: Secondary | ICD-10-CM | POA: Insufficient documentation

## 2023-09-26 NOTE — Assessment & Plan Note (Signed)
-   Prescribe Flonase spray, two sprays twice daily. - Continue Claritin or Zyrtec for allergies.

## 2023-09-26 NOTE — Assessment & Plan Note (Signed)
 Elevated blood pressure likely due to over-the-counter medications. - Advise discontinuation of non-blood pressure-friendly over-the-counter medications. - Recommend using over-the-counter medications labeled for high blood pressure.  - Currently taking Benicar 15 mg daily and indapamide 1.25 mg daily.  - Follows with cardiology and nephrology

## 2023-09-26 NOTE — Assessment & Plan Note (Signed)
 Symptoms consistent with viral URI, likely RSV. No bacterial infection. Symptoms improving. - Prescribe Flonase spray, two sprays twice daily. - Continue Claritin or Zyrtec for allergies. - Prescribe Tessalon Perles for cough, caution toxicity risk to children. - Advise symptomatic treatment with cough drops and Mucinex. - Instruct to monitor for fever, worsening cough, chest pain, or wheezing and report if these occur. - Advise on hygiene measures to prevent virus spread.

## 2023-11-07 ENCOUNTER — Other Ambulatory Visit: Payer: Self-pay | Admitting: Family Medicine

## 2023-11-07 DIAGNOSIS — Z1231 Encounter for screening mammogram for malignant neoplasm of breast: Secondary | ICD-10-CM

## 2023-11-07 DIAGNOSIS — E113413 Type 2 diabetes mellitus with severe nonproliferative diabetic retinopathy with macular edema, bilateral: Secondary | ICD-10-CM | POA: Diagnosis not present

## 2023-11-07 DIAGNOSIS — H2513 Age-related nuclear cataract, bilateral: Secondary | ICD-10-CM | POA: Diagnosis not present

## 2023-11-07 DIAGNOSIS — H40053 Ocular hypertension, bilateral: Secondary | ICD-10-CM | POA: Diagnosis not present

## 2023-12-02 ENCOUNTER — Encounter

## 2023-12-14 DIAGNOSIS — E1169 Type 2 diabetes mellitus with other specified complication: Secondary | ICD-10-CM | POA: Insufficient documentation

## 2023-12-14 DIAGNOSIS — B009 Herpesviral infection, unspecified: Secondary | ICD-10-CM | POA: Insufficient documentation

## 2023-12-14 DIAGNOSIS — E538 Deficiency of other specified B group vitamins: Secondary | ICD-10-CM | POA: Insufficient documentation

## 2023-12-14 NOTE — Patient Instructions (Incomplete)
 It was a pleasure meeting you today. Thank you for allowing me to take part in your health care.  Our goals for today as we discussed include:  Refills sent for requested medications  We will get some labs today.  If they are abnormal or we need to do something about them, I will call you.  If they are normal, I will send you a message on MyChart (if it is active) or a letter in the mail.  If you don't hear from us  in 2 weeks, please call the office at the number below.    Debrox 5 drops to right ear two times a day for 5-7 days.  If no improvement can schedule nurse visit for ear flushing.  Can go to walk in at  [facility]  [address], [place], [place] [address] Phone: [phone]  This is a list of the screening recommended for you and due dates:  Health Maintenance  Topic Date Due   Zoster (Shingles) Vaccine (1 of 2) Never done   Eye exam for diabetics  11/28/2022   COVID-19 Vaccine (7 - 2024-25 season) 06/19/2023   Hemoglobin A1C  10/05/2023   Yearly kidney health urinalysis for diabetes  11/14/2023   Mammogram  11/28/2023   Flu Shot  02/06/2024   Medicare Annual Wellness Visit  03/13/2024   Yearly kidney function blood test for diabetes  04/06/2024   Colon Cancer Screening  10/17/2029   DEXA scan (bone density measurement)  Completed   Hepatitis C Screening  Completed   HPV Vaccine  Aged Out   Meningitis B Vaccine  Aged Out   DTaP/Tdap/Td vaccine  Discontinued   Pneumonia Vaccine  Discontinued   Complete foot exam   Discontinued     If you have any questions or concerns, please do not hesitate to call the office at [phone].  I look forward to our next visit and until then take care and stay safe.  Regards,   Valli Gaw, MD   [facility]

## 2023-12-15 ENCOUNTER — Ambulatory Visit: Payer: Medicare Other | Admitting: Family Medicine

## 2023-12-15 ENCOUNTER — Encounter: Payer: Self-pay | Admitting: Family Medicine

## 2023-12-15 VITALS — BP 138/86 | HR 77 | Temp 97.9°F | Resp 20 | Ht 67.0 in | Wt 159.5 lb

## 2023-12-15 DIAGNOSIS — E049 Nontoxic goiter, unspecified: Secondary | ICD-10-CM | POA: Diagnosis not present

## 2023-12-15 DIAGNOSIS — E118 Type 2 diabetes mellitus with unspecified complications: Secondary | ICD-10-CM | POA: Diagnosis not present

## 2023-12-15 DIAGNOSIS — N1831 Chronic kidney disease, stage 3a: Secondary | ICD-10-CM

## 2023-12-15 DIAGNOSIS — E538 Deficiency of other specified B group vitamins: Secondary | ICD-10-CM

## 2023-12-15 DIAGNOSIS — Z0000 Encounter for general adult medical examination without abnormal findings: Secondary | ICD-10-CM | POA: Diagnosis not present

## 2023-12-15 DIAGNOSIS — E1169 Type 2 diabetes mellitus with other specified complication: Secondary | ICD-10-CM

## 2023-12-15 DIAGNOSIS — B372 Candidiasis of skin and nail: Secondary | ICD-10-CM | POA: Diagnosis not present

## 2023-12-15 DIAGNOSIS — I129 Hypertensive chronic kidney disease with stage 1 through stage 4 chronic kidney disease, or unspecified chronic kidney disease: Secondary | ICD-10-CM

## 2023-12-15 DIAGNOSIS — I152 Hypertension secondary to endocrine disorders: Secondary | ICD-10-CM

## 2023-12-15 DIAGNOSIS — E785 Hyperlipidemia, unspecified: Secondary | ICD-10-CM | POA: Diagnosis not present

## 2023-12-15 DIAGNOSIS — B009 Herpesviral infection, unspecified: Secondary | ICD-10-CM

## 2023-12-15 DIAGNOSIS — H6121 Impacted cerumen, right ear: Secondary | ICD-10-CM | POA: Diagnosis not present

## 2023-12-15 DIAGNOSIS — E1159 Type 2 diabetes mellitus with other circulatory complications: Secondary | ICD-10-CM

## 2023-12-15 DIAGNOSIS — E559 Vitamin D deficiency, unspecified: Secondary | ICD-10-CM | POA: Diagnosis not present

## 2023-12-15 MED ORDER — CLOTRIMAZOLE 1 % EX CREA
1.0000 | TOPICAL_CREAM | Freq: Two times a day (BID) | CUTANEOUS | 1 refills | Status: AC
Start: 2023-12-15 — End: ?

## 2023-12-15 MED ORDER — METFORMIN HCL 1000 MG PO TABS: ORAL_TABLET | ORAL | 3 refills | Status: AC

## 2023-12-15 NOTE — Progress Notes (Addendum)
 SUBJECTIVE:   Chief Complaint  Patient presents with   Medical Management of Chronic Issues    Yearly follow up   HPI Presents to clinic for annual physical  Discussed the use of AI scribe software for clinical note transcription with the patient, who gave verbal consent to proceed.  History of Present Illness Natasha Phillips is a 73 year old female who presents for an annual physical exam.  She is scheduled for a mammogram this month and is awaiting the results. She has been fasting for her blood work, having only consumed a small amount of greens to take her metformin . She is up to date with her vaccines, including flu and COVID vaccines, and regularly performs breast self-exams during showers. She has an upcoming appointment with her nephrologist on June 23rd for labs and a follow-up on June 30th.  Her current medications include metformin  once daily, Benicar  three tablets twice daily, and indapamide 1.25 mg once daily, all prescribed by her nephrologist. She uses valacyclovir  as needed for fever blisters and applies triamcinolone  for eczema twice daily. She also takes omeprazole  occasionally.  She has been experiencing pain in her hand and arm for about a month, described as soreness with occasional feelings of being 'out of whack' or getting stuck. She has a history of sciatica and wonders if it might be related. No numbness or tingling in her hand or arm.  She experiences occasional constipation, which she manages by chewing fiber supplements and eating greens. She drinks water regularly to help with this issue. No chest pain or shortness of breath. She reports occasional itching related to eczema.  Review of Systems - Negative except listed above    PERTINENT PMH / PSH: As above  OBJECTIVE:  BP 138/86   Pulse 77   Temp 97.9 F (36.6 C)   Resp 20   Ht 5\' 7"  (1.702 m)   Wt 159 lb 8 oz (72.3 kg)   SpO2 99%   BMI 24.98 kg/m    Physical Exam Vitals reviewed.   Constitutional:      General: She is not in acute distress.    Appearance: Normal appearance. She is normal weight. She is not ill-appearing, toxic-appearing or diaphoretic.  HENT:     Right Ear: There is impacted cerumen.     Left Ear: Tympanic membrane, ear canal and external ear normal.  Eyes:     General:        Right eye: No discharge.        Left eye: No discharge.     Conjunctiva/sclera: Conjunctivae normal.  Cardiovascular:     Rate and Rhythm: Normal rate and regular rhythm.     Heart sounds: Normal heart sounds.  Pulmonary:     Effort: Pulmonary effort is normal.     Breath sounds: Normal breath sounds.  Abdominal:     General: Bowel sounds are normal.  Musculoskeletal:        General: Normal range of motion.  Skin:    General: Skin is warm and dry.  Neurological:     General: No focal deficit present.     Mental Status: She is alert and oriented to person, place, and time. Mental status is at baseline.  Psychiatric:        Mood and Affect: Mood normal.        Behavior: Behavior normal.        Thought Content: Thought content normal.        Judgment: Judgment  normal.           12/15/2023    9:50 AM 09/23/2023   11:02 AM 04/07/2023    2:49 PM 03/14/2023   11:30 AM 12/10/2022    8:50 AM  Depression screen PHQ 2/9  Decreased Interest 0 0 0 0 0  Down, Depressed, Hopeless 0 0 0 0 0  PHQ - 2 Score 0 0 0 0 0  Altered sleeping 0 0 0 0 0  Tired, decreased energy 0 0 0 0 0  Change in appetite 0 0 0 0 0  Feeling bad or failure about yourself  0 0 0 0 0  Trouble concentrating 0 0 0 0 0  Moving slowly or fidgety/restless 0 0 0 0 0  Suicidal thoughts 0 0 0 0 0  PHQ-9 Score 0 0 0 0 0  Difficult doing work/chores Not difficult at all Not difficult at all  Not difficult at all       12/15/2023    9:50 AM 09/23/2023   11:02 AM 04/07/2023    2:49 PM 10/21/2022    2:01 PM  GAD 7 : Generalized Anxiety Score  Nervous, Anxious, on Edge 0 0 0 0  Control/stop worrying 0 0 0 0   Worry too much - different things 0 0 0 0  Trouble relaxing 0 0 0 0  Restless 0 0 0 0  Easily annoyed or irritable 0 0 0 0  Afraid - awful might happen 0 0 0 0  Total GAD 7 Score 0 0 0 0  Anxiety Difficulty Not difficult at all Not difficult at all Not difficult at all Not difficult at all    ASSESSMENT/PLAN:  Annual physical exam Assessment & Plan: Mammogram scheduled 12/17/23.  Last Pap 09/19/22, NILM, HPV negative.  Follows with OB/GYN.  Due 09/18/24.   Recommend Tetanus booster. Eye exam due, scheduled 11/24 per patient Colonoscopy up-to-date.  Due 04/31 Pneumonia completed Hepatitis C screening completed Decline shingles vaccine Depression/GAD screening negative Labs today   Hypertension associated with diabetes (HCC) Assessment & Plan: Well controlled Continue Olmesartan  15 mg BID Continue Indapamide 1.25 mg daily HTN medication managed by nephrology Follows with cardiology and nephrology     Orders: -     Comprehensive metabolic panel with GFR  Type 2 diabetes mellitus with complications (HCC) Assessment & Plan: Chronic.  Stable on current medication.   Refill metformin  1000 mg daily On ARB therapy. Not on statin therapy, recent LDL at goal less than 70. Eye exam up to date,  due 12/25 Declined foot exam UACR today Check labs   Orders: -     Hemoglobin A1c -     Microalbumin / creatinine urine ratio -     metFORMIN  HCl; TAKE 1 TABLET(1000 MG) BY MOUTH DAILY  Dispense: 90 tablet; Refill: 3  Thyroid  goiter Assessment & Plan: Asymptomatic Check TSH level  Orders: -     TSH  Stage 3a chronic kidney disease (HCC) Assessment & Plan: Chronic Blood pressure well controlled On ARB Follows with Nephrology Avoid nephrotoxic agents  Orders: -     Microalbumin / creatinine urine ratio -     Comprehensive metabolic panel with GFR  Vitamin D  deficiency Assessment & Plan: Check Vitamin D  level  Orders: -     VITAMIN D  25 Hydroxy (Vit-D  Deficiency, Fractures)  Vitamin B 12 deficiency Assessment & Plan: Check Vitamin B 12 level  Orders: -     Vitamin B12  Hyperlipidemia associated with type  2 diabetes mellitus (HCC) Assessment & Plan: Not currently on statin  Last LDL 64.  Declined statin therapy Check fasting lipids  Orders: -     Lipid panel  Candidal intertrigo Assessment & Plan: Refill Clotrimazole     Orders: -     Clotrimazole ; Apply 1 Application topically 2 (two) times daily.  Dispense: 30 g; Refill: 1  Ceruminosis, right Assessment & Plan: Incidental finding on exam.  Denies any ringing of ears or loss of hearing.     Recommend Debrox BID for 5-7 days If no improvement can schedule nurse visit for irrigation of right ear    Patient prefers to have blood work results sent through regular post  and not MyChart.    PDMP reviewed  Return in about 6 months (around 06/15/2024), or if symptoms worsen or fail to improve, for PCP.  Valli Gaw, MD

## 2023-12-15 NOTE — Assessment & Plan Note (Signed)
 Incidental finding on exam.  Denies any ringing of ears or loss of hearing.     Recommend Debrox BID for 5-7 days If no improvement can schedule nurse visit for irrigation of right ear

## 2023-12-15 NOTE — Assessment & Plan Note (Signed)
Refill Clotrimazole

## 2023-12-15 NOTE — Assessment & Plan Note (Signed)
 Not currently on statin  Last LDL 64.  Declined statin therapy Check fasting lipids

## 2023-12-15 NOTE — Assessment & Plan Note (Addendum)
 Chronic Blood pressure well controlled On ARB Follows with Nephrology Avoid nephrotoxic agents

## 2023-12-15 NOTE — Assessment & Plan Note (Signed)
 Asymptomatic Check TSH level

## 2023-12-15 NOTE — Assessment & Plan Note (Signed)
 Mammogram scheduled 12/17/23.  Last Pap 09/19/22, NILM, HPV negative.  Follows with OB/GYN.  Due 09/18/24.   Recommend Tetanus booster. Eye exam due, scheduled 11/24 per patient Colonoscopy up-to-date.  Due 04/31 Pneumonia completed Hepatitis C screening completed Decline shingles vaccine Depression/GAD screening negative Labs today

## 2023-12-15 NOTE — Assessment & Plan Note (Signed)
 Check Vitamin D level

## 2023-12-15 NOTE — Assessment & Plan Note (Addendum)
 Well controlled Continue Olmesartan  15 mg BID Continue Indapamide 1.25 mg daily HTN medication managed by nephrology Follows with cardiology and nephrology

## 2023-12-15 NOTE — Assessment & Plan Note (Signed)
 Check Vitamin B 12 level

## 2023-12-15 NOTE — Assessment & Plan Note (Signed)
 Chronic.  Stable on current medication.   Refill metformin  1000 mg daily On ARB therapy. Not on statin therapy, recent LDL at goal less than 70. Eye exam up to date,  due 12/25 Declined foot exam UACR today Check labs

## 2023-12-16 ENCOUNTER — Telehealth: Payer: Self-pay

## 2023-12-16 ENCOUNTER — Ambulatory Visit: Payer: Self-pay | Admitting: Family Medicine

## 2023-12-16 LAB — COMPREHENSIVE METABOLIC PANEL WITH GFR
ALT: 13 IU/L (ref 0–32)
AST: 23 IU/L (ref 0–40)
Albumin: 4.4 g/dL (ref 3.8–4.8)
Alkaline Phosphatase: 118 IU/L (ref 44–121)
BUN/Creatinine Ratio: 15 (ref 12–28)
BUN: 15 mg/dL (ref 8–27)
Bilirubin Total: 0.3 mg/dL (ref 0.0–1.2)
CO2: 21 mmol/L (ref 20–29)
Calcium: 10.3 mg/dL (ref 8.7–10.3)
Chloride: 102 mmol/L (ref 96–106)
Creatinine, Ser: 0.99 mg/dL (ref 0.57–1.00)
Globulin, Total: 2.9 g/dL (ref 1.5–4.5)
Glucose: 122 mg/dL — ABNORMAL HIGH (ref 70–99)
Potassium: 4.8 mmol/L (ref 3.5–5.2)
Sodium: 141 mmol/L (ref 134–144)
Total Protein: 7.3 g/dL (ref 6.0–8.5)
eGFR: 61 mL/min/{1.73_m2} (ref >59)

## 2023-12-16 LAB — VITAMIN B12: Vitamin B-12: 748 pg/mL (ref 232–1245)

## 2023-12-16 LAB — TSH: TSH: 0.949 u[IU]/mL (ref 0.450–4.500)

## 2023-12-16 LAB — LIPID PANEL
Chol/HDL Ratio: 2.5 ratio (ref 0.0–4.4)
Cholesterol, Total: 142 mg/dL (ref 100–199)
HDL: 56 mg/dL (ref >39)
LDL Chol Calc (NIH): 74 mg/dL (ref 0–99)
Triglycerides: 54 mg/dL (ref 0–149)
VLDL Cholesterol Cal: 12 mg/dL (ref 5–40)

## 2023-12-16 LAB — MICROALBUMIN / CREATININE URINE RATIO
Creatinine, Urine: 21.3 mg/dL
Microalb/Creat Ratio: 31 mg/g{creat} — ABNORMAL HIGH (ref 0–29)
Microalbumin, Urine: 6.6 ug/mL

## 2023-12-16 LAB — HEMOGLOBIN A1C
Est. average glucose Bld gHb Est-mCnc: 148 mg/dL
Hgb A1c MFr Bld: 6.8 % — ABNORMAL HIGH (ref 4.8–5.6)

## 2023-12-16 LAB — VITAMIN D 25 HYDROXY (VIT D DEFICIENCY, FRACTURES): Vit D, 25-Hydroxy: 43.9 ng/mL (ref 30.0–100.0)

## 2023-12-16 NOTE — Telephone Encounter (Signed)
 Spoke to Pt and sent a message to provider for answers.

## 2023-12-16 NOTE — Telephone Encounter (Signed)
 Copied from CRM [id number]. Topic: General - Other >> Dec 16, 2023  8:43 AM Alysia Jumbo S wrote: Reason for CRM: Patient has questions about the paperwork she received during her visit. Requesting callback from nurse/provider Callback [phone]

## 2023-12-17 ENCOUNTER — Ambulatory Visit
Admission: RE | Admit: 2023-12-17 | Discharge: 2023-12-17 | Disposition: A | Discharge status: Home / Self Care | Source: Ambulatory Visit | Attending: Family Medicine | Admitting: Family Medicine

## 2023-12-17 DIAGNOSIS — Z1231 Encounter for screening mammogram for malignant neoplasm of breast: Secondary | ICD-10-CM | POA: Diagnosis not present

## 2023-12-19 NOTE — Telephone Encounter (Signed)
 Left message to return call to our office.  Please see result note.

## 2023-12-19 NOTE — Telephone Encounter (Unsigned)
 Copied from CRM [id number]. Topic: General - Other >> Dec 19, 2023  8:41 AM Allyne Areola wrote: Reason for CRM: Patient would like to speak with Loetta Ringer. She spoke with her previously regarding some questions she has but hasn't gotten a response.

## 2023-12-25 NOTE — Telephone Encounter (Signed)
 Copied from CRM [id number]. Topic: General - Other >> Dec 25, 2023  9:23 AM [place] E wrote: Patient stated she missed a call from [place] in office. Relayed most recent lab note from PCP, but patient stated she was already made aware of that and is awaiting urine results.

## 2023-12-29 DIAGNOSIS — E119 Type 2 diabetes mellitus without complications: Secondary | ICD-10-CM | POA: Diagnosis not present

## 2023-12-29 DIAGNOSIS — N1831 Chronic kidney disease, stage 3a: Secondary | ICD-10-CM | POA: Diagnosis not present

## 2023-12-29 DIAGNOSIS — I10 Essential (primary) hypertension: Secondary | ICD-10-CM | POA: Diagnosis not present

## 2024-01-01 NOTE — Telephone Encounter (Signed)
 Copied from CRM [id number]. Topic: Clinical - Lab/Test Results >> Jan 01, 2024  8:41 AM Ernestene P wrote: Reason for CRM: Pt called back to go over urine results- relayed result to pt, pt understood.

## 2024-01-05 DIAGNOSIS — E119 Type 2 diabetes mellitus without complications: Secondary | ICD-10-CM | POA: Diagnosis not present

## 2024-01-05 DIAGNOSIS — M654 Radial styloid tenosynovitis [de Quervain]: Secondary | ICD-10-CM | POA: Diagnosis not present

## 2024-01-05 DIAGNOSIS — I10 Essential (primary) hypertension: Secondary | ICD-10-CM | POA: Diagnosis not present

## 2024-01-05 DIAGNOSIS — N1831 Chronic kidney disease, stage 3a: Secondary | ICD-10-CM | POA: Diagnosis not present

## 2024-04-07 DIAGNOSIS — E119 Type 2 diabetes mellitus without complications: Secondary | ICD-10-CM | POA: Diagnosis not present

## 2024-04-07 DIAGNOSIS — I10 Essential (primary) hypertension: Secondary | ICD-10-CM | POA: Diagnosis not present

## 2024-04-07 DIAGNOSIS — N1831 Chronic kidney disease, stage 3a: Secondary | ICD-10-CM | POA: Diagnosis not present

## 2024-05-14 DIAGNOSIS — Z23 Encounter for immunization: Secondary | ICD-10-CM | POA: Diagnosis not present

## 2024-05-25 ENCOUNTER — Telehealth: Payer: Self-pay

## 2024-05-25 NOTE — Telephone Encounter (Signed)
 Patient returned call and scheduled for Friday at 11:00 am.

## 2024-05-25 NOTE — Telephone Encounter (Signed)
 noted

## 2024-05-25 NOTE — Telephone Encounter (Signed)
 Copied from CRM [id number]. Topic: General - Other >> May 25, 2024  8:53 AM Aleatha C wrote: Reason for CRM: Patient is having a echo in her ear and would like a call for suggestions on what she can do prior to her appointment 11/24 please call her mobile # [phone]

## 2024-05-25 NOTE — Telephone Encounter (Signed)
 Lvm for pt to give office a call back. We have an opening on Friday with np on Friday at 10:40am please offer pt this appt. Or an appt with Dr. Narendra on Monday.

## 2024-05-25 NOTE — Telephone Encounter (Signed)
 It appears her appt was moved up. She was asking if there is anything she can do prior to her appt. Need to clarify what symptoms she is having. Any congestion, ear ache, sinus pressure?  Headache? Blood pressure ok?

## 2024-05-26 NOTE — Telephone Encounter (Signed)
 Left voicemail, okay to relay. See message below.

## 2024-05-28 ENCOUNTER — Ambulatory Visit: Admitting: Family Medicine

## 2024-05-28 ENCOUNTER — Ambulatory Visit (INDEPENDENT_AMBULATORY_CARE_PROVIDER_SITE_OTHER): Admitting: Nurse Practitioner

## 2024-05-28 ENCOUNTER — Encounter: Payer: Self-pay | Admitting: Nurse Practitioner

## 2024-05-28 VITALS — BP 126/80 | HR 78 | Temp 97.9°F | Ht 67.0 in | Wt 156.4 lb

## 2024-05-28 DIAGNOSIS — J3089 Other allergic rhinitis: Secondary | ICD-10-CM

## 2024-05-28 DIAGNOSIS — H8110 Benign paroxysmal vertigo, unspecified ear: Secondary | ICD-10-CM | POA: Diagnosis not present

## 2024-05-28 DIAGNOSIS — H6992 Unspecified Eustachian tube disorder, left ear: Secondary | ICD-10-CM | POA: Diagnosis not present

## 2024-05-28 NOTE — Patient Instructions (Signed)
-  Continue taking claritin and flonase  nasal spray. -Referred to ENT for further evaluation

## 2024-05-28 NOTE — Progress Notes (Unsigned)
 Established Patient Office Visit  Subjective:  Patient ID: Natasha Phillips, female    DOB: [birth year]  Age: 73 y.o. MRN: [id number]  CC:  Chief Complaint  Patient presents with   Acute Visit    Echo in left ear from Monday to Thursday Headache and left ear pain 3/10   Discussed the use of AI scribe software for clinical note transcription with the patient, who gave verbal consent to proceed.  History of Present Illness   History of Present Illness   Natasha Phillips is a 73 year old female who presents with left ear pain, echoing, and vertigo.  She experiences noise and an echo in her left ear, accompanied by pain, which began on "Sunday. The echo resembles a 'natural echo' similar to being in an empty room and is exacerbated by wind exposure. She has a history of ear problems and previously consulted an ENT for similar issues during the COVID pandemic.  Vertigo started around Tuesday, with episodes of dizziness and imbalance, which have improved since onset. She has a history of vertigo and has undergone testing and therapy for balance issues, which were unhelpful. She performs exercises at home to manage her symptoms.  She experiences occasional congestion and a cough with expectoration of cloudy or yellow mucus, but no fever. She uses Flonase and started taking Claritin on Tuesday. She also used azelastine nasal spray once and took Tylenol for headache relief, which has since resolved. No hearing issues are present.   Past Medical History:  Diagnosis Date   Anemia    CKD (chronic kidney disease)    COVID-19    06/18/2021 [facility]   Diabetes mellitus without complication (HCC)    Glaucoma    [facility] with macular edema and Dr. Tenika Johnson sees Q3-4 months   Hypertension     Past Surgical History:  Procedure Laterality Date   BIOPSY THYROID     CESAREAN SECTION     19" 81 1988   COLONOSCOPY WITH PROPOFOL  N/A 01/26/2021   Procedure: COLONOSCOPY WITH PROPOFOL ;  Surgeon:  Janalyn Keene NOVAK, MD;  Location: [facility] ENDOSCOPY;  Service: Endoscopy;  Laterality: N/A;   COLONOSCOPY WITH PROPOFOL  N/A 10/18/2022   Procedure: COLONOSCOPY WITH PROPOFOL ;  Surgeon: Unk Corinn Skiff, MD;  Location: [facility] ENDOSCOPY;  Service: Gastroenterology;  Laterality: N/A;   ESOPHAGOGASTRODUODENOSCOPY (EGD) WITH PROPOFOL  N/A 01/26/2021   Procedure: ESOPHAGOGASTRODUODENOSCOPY (EGD) WITH PROPOFOL ;  Surgeon: Janalyn Keene NOVAK, MD;  Location: [facility] ENDOSCOPY;  Service: Endoscopy;  Laterality: N/A;   TUBAL LIGATION      Family History  Problem Relation Age of Onset   Diabetes Mother    Heart failure Mother    Diabetes Sister    Healthy Sister    Healthy Sister    Healthy Sister    Diabetes Brother    Diabetes Sister    Heart failure Father    Brain cancer Brother    Throat cancer Brother    Glaucoma Other        Runs on Paternal Side   Breast cancer Cousin     Social History   Socioeconomic History   Marital status: Widowed    Spouse name: Not on file   Number of children: 2   Years of education: College   Highest education level: Not on file  Occupational History   Occupation: Retired 2014    Comment: Previously [employer]  Tobacco Use   Smoking status: Never   Smokeless tobacco: Never  Vaping Use  Vaping status: Never Used  Substance and Sexual Activity   Alcohol use: No   Drug use: No   Sexual activity: Not Currently    Birth control/protection: None  Other Topics Concern   Not on file  Social History Narrative   Lives at home    Social Drivers of Health   Financial Resource Strain: Low Risk  (03/14/2023)   Overall Financial Resource Strain (CARDIA)    Difficulty of Paying Living Expenses: Not hard at all  Food Insecurity: No Food Insecurity (03/14/2023)   Hunger Vital Sign    Worried About Running Out of Food in the Last Year: Never true    Ran Out of Food in the Last Year: Never true  Transportation Needs: No Transportation Needs (03/14/2023)    PRAPARE - Administrator, Civil Service (Medical): No    Lack of Transportation (Non-Medical): No  Physical Activity: Sufficiently Active (03/14/2023)   Exercise Vital Sign    Days of Exercise per Week: 5 days    Minutes of Exercise per Session: 30 min  Stress: No Stress Concern Present (03/14/2023)   [employer] of Occupational Health - Occupational Stress Questionnaire    Feeling of Stress : Not at all  Social Connections: Moderately Integrated (03/14/2023)   Social Connection and Isolation Panel    Frequency of Communication with Friends and Family: More than three times a week    Frequency of Social Gatherings with Friends and Family: More than three times a week    Attends Religious Services: More than 4 times per year    Active Member of [employer] or Organizations: Yes    Attends Banker Meetings: More than 4 times per year    Marital Status: Widowed  Intimate Partner Violence: Not At Risk (03/14/2023)   Humiliation, Afraid, Rape, and Kick questionnaire    Fear of Current or Ex-Partner: No    Emotionally Abused: No    Physically Abused: No    Sexually Abused: No     Outpatient Medications Prior to Visit  Medication Sig Dispense Refill   aspirin EC 81 MG tablet Take 81 mg by mouth daily.     azelastine  (ASTELIN ) 0.1 % nasal spray Place 2 sprays into both nostrils 2 (two) times daily. Use in each nostril as directed 30 mL 12   clotrimazole  (LOTRIMIN ) 1 % cream Apply 1 Application topically 2 (two) times daily. 30 g 1   Ferrous Sulfate 27 MG TABS Take by mouth.     fluticasone  (FLONASE ) 50 MCG/ACT nasal spray Place 2 sprays into both nostrils in the morning and at bedtime. 16 g 6   hydrocortisone  (ANUSOL -HC) 2.5 % rectal cream Place 1 Application rectally 3 (three) times daily. 30 g 11   indapamide (LOZOL) 1.25 MG tablet Take 1.25 mg by mouth daily.     metFORMIN  (GLUCOPHAGE ) 1000 MG tablet TAKE 1 TABLET(1000 MG) BY MOUTH DAILY 90 tablet 3   Multiple Vitamin  (MULTIVITAMIN WITH MINERALS) TABS tablet Take 1 tablet by mouth daily.     olmesartan  (BENICAR ) 5 MG tablet Take 3 tablets (15 mg total) by mouth 2 (two) times daily. 540 tablet 3   omeprazole  (PRILOSEC) 40 MG capsule TAKE 1 CAPSULE(40 MG) BY MOUTH DAILY BEFORE BREAKFAST 30 capsule 0   triamcinolone  cream (KENALOG ) 0.1 % APPLY TOPICALLY TO THE AFFECTED AREA TWICE DAILY AS NEEDED FOR ECZEMA 30 g 2   valACYclovir  (VALTREX ) 1000 MG tablet Take 1 tablet (1,000 mg total) by mouth 2 (  two) times daily. X 5-7 days with food as needed outbreak 90 tablet 0   No facility-administered medications prior to visit.    Allergies  Allergen Reactions   Coreg  [Carvedilol ]     Could not tolerate     Hctz [Hydrochlorothiazide ]     Low na 124    Januvia  [Sitagliptin ] Diarrhea   Promethazine Hives and Itching   Sulfa Antibiotics    Losartan  Rash    ROS Review of Systems Negative unless indicated in HPI.    Objective:    Physical Exam Constitutional:      Appearance: Normal appearance.  HENT:     Right Ear: Tympanic membrane normal.     Left Ear: Tympanic membrane normal.     Mouth/Throat:     Mouth: Mucous membranes are moist.  Eyes:     Conjunctiva/sclera: Conjunctivae normal.     Pupils: Pupils are equal, round, and reactive to light.  Cardiovascular:     Rate and Rhythm: Normal rate and regular rhythm.     Pulses: Normal pulses.     Heart sounds: Normal heart sounds.  Pulmonary:     Effort: Pulmonary effort is normal.     Breath sounds: Normal breath sounds.  Musculoskeletal:     Cervical back: Normal range of motion. No tenderness.  Skin:    General: Skin is warm.     Findings: No bruising.  Neurological:     General: No focal deficit present.     Mental Status: She is alert and oriented to person, place, and time. Mental status is at baseline.  Psychiatric:        Mood and Affect: Mood normal.        Behavior: Behavior normal.        Thought Content: Thought content normal.         Judgment: Judgment normal.     BP 126/80   Pulse 78   Temp 97.9 F (36.6 C)   Ht 5' 7 (1.702 m)   Wt 156 lb 6.4 oz (70.9 kg)   SpO2 99%   BMI 24.50 kg/m  Wt Readings from Last 3 Encounters:  05/28/24 156 lb 6.4 oz (70.9 kg)  12/15/23 159 lb 8 oz (72.3 kg)  09/23/23 164 lb 8 oz (74.6 kg)     Health Maintenance  Topic Date Due   Zoster Vaccines- Shingrix (1 of 2) Never done   COVID-19 Vaccine (7 - 2025-26 season) 03/08/2024   Medicare Annual Wellness (AWV)  03/13/2024   HEMOGLOBIN A1C  06/15/2024   OPHTHALMOLOGY EXAM  11/06/2024   Diabetic kidney evaluation - eGFR measurement  12/14/2024   Diabetic kidney evaluation - Urine ACR  12/14/2024   Mammogram  12/16/2024   Colonoscopy  10/17/2029   Influenza Vaccine  Completed   Bone Density Scan  Completed   Hepatitis C Screening  Completed   Meningococcal B Vaccine  Aged Out   DTaP/Tdap/Td  Discontinued   Pneumococcal Vaccine: 50+ Years  Discontinued   Hepatitis B Vaccines 19-59 Average Risk  Discontinued   FOOT EXAM  Discontinued    There are no preventive care reminders to display for this patient.  Lab Results  Component Value Date   TSH 0.949 12/15/2023   Lab Results  Component Value Date   WBC 5.7 04/07/2023   HGB 11.3 (L) 04/07/2023   HCT 34.0 (L) 04/07/2023   MCV 90.0 04/07/2023   PLT 302.0 04/07/2023   Lab Results  Component Value Date   NA  141 12/15/2023   K 4.8 12/15/2023   CO2 21 12/15/2023   GLUCOSE 122 (H) 12/15/2023   BUN 15 12/15/2023   CREATININE 0.99 12/15/2023   BILITOT 0.3 12/15/2023   ALKPHOS 118 12/15/2023   AST 23 12/15/2023   ALT 13 12/15/2023   PROT 7.3 12/15/2023   ALBUMIN 4.4 12/15/2023   CALCIUM 10.3 12/15/2023   ANIONGAP 7 04/18/2019   EGFR 61 12/15/2023   GFR 48.25 (L) 04/07/2023   Lab Results  Component Value Date   CHOL 142 12/15/2023   Lab Results  Component Value Date   HDL 56 12/15/2023   Lab Results  Component Value Date   LDLCALC 74 12/15/2023    Lab Results  Component Value Date   TRIG 54 12/15/2023   Lab Results  Component Value Date   CHOLHDL 2.5 12/15/2023   Lab Results  Component Value Date   HGBA1C 6.8 (H) 12/15/2023      Assessment & Plan:   Assessment & Plan Dysfunction of left eustachian tube Acute left ear pain with echo and noise, associated with vertigo and headache. Symptoms improved but persistent. Left ear pain with echo and noise. - Referred to ENT for further evaluation. Orders:   Ambulatory referral to ENT  Benign paroxysmal positional vertigo, unspecified laterality H/o intermittent vertigo.Symptoms improved but recur. Previous ENT evaluation and balance therapy not beneficial. - Referred to ENT for further evaluation.    Allergic rhinitis due to other allergic trigger, unspecified seasonality Chronic allergic rhinitis with postnasal drip, nasal congestion, and occasional cough. Managed with Claritin and Flonase . Swollen nasal turbinates contribute to symptoms. - Continue Claritin. - Use Flonase  for nasal congestion and postnasal drip.     Assessment & Plan   Follow-up: No follow-ups on file.   Carlyon Nolasco, NP

## 2024-05-31 ENCOUNTER — Ambulatory Visit: Admitting: Internal Medicine

## 2024-06-03 DIAGNOSIS — H8110 Benign paroxysmal vertigo, unspecified ear: Secondary | ICD-10-CM | POA: Insufficient documentation

## 2024-06-03 NOTE — Assessment & Plan Note (Signed)
 H/o intermittent vertigo.Symptoms improved but recur. Previous ENT evaluation and balance therapy not beneficial. - Referred to ENT for further evaluation.

## 2024-06-09 DIAGNOSIS — E113211 Type 2 diabetes mellitus with mild nonproliferative diabetic retinopathy with macular edema, right eye: Secondary | ICD-10-CM | POA: Diagnosis not present

## 2024-06-09 DIAGNOSIS — H2513 Age-related nuclear cataract, bilateral: Secondary | ICD-10-CM | POA: Diagnosis not present

## 2024-06-09 DIAGNOSIS — H04129 Dry eye syndrome of unspecified lacrimal gland: Secondary | ICD-10-CM | POA: Diagnosis not present

## 2024-06-21 ENCOUNTER — Ambulatory Visit: Admitting: Internal Medicine

## 2024-06-21 ENCOUNTER — Encounter: Payer: Self-pay | Admitting: Internal Medicine

## 2024-06-21 ENCOUNTER — Ambulatory Visit: Payer: Self-pay

## 2024-06-21 ENCOUNTER — Ambulatory Visit: Payer: Self-pay | Admitting: Internal Medicine

## 2024-06-21 VITALS — BP 122/78 | HR 84 | Temp 98.1°F | Ht 67.0 in | Wt 160.0 lb

## 2024-06-21 DIAGNOSIS — E118 Type 2 diabetes mellitus with unspecified complications: Secondary | ICD-10-CM | POA: Diagnosis not present

## 2024-06-21 DIAGNOSIS — E785 Hyperlipidemia, unspecified: Secondary | ICD-10-CM | POA: Diagnosis not present

## 2024-06-21 DIAGNOSIS — N1831 Chronic kidney disease, stage 3a: Secondary | ICD-10-CM

## 2024-06-21 DIAGNOSIS — H6690 Otitis media, unspecified, unspecified ear: Secondary | ICD-10-CM | POA: Diagnosis not present

## 2024-06-21 DIAGNOSIS — E1159 Type 2 diabetes mellitus with other circulatory complications: Secondary | ICD-10-CM | POA: Diagnosis not present

## 2024-06-21 DIAGNOSIS — I152 Hypertension secondary to endocrine disorders: Secondary | ICD-10-CM

## 2024-06-21 DIAGNOSIS — I129 Hypertensive chronic kidney disease with stage 1 through stage 4 chronic kidney disease, or unspecified chronic kidney disease: Secondary | ICD-10-CM

## 2024-06-21 DIAGNOSIS — E1169 Type 2 diabetes mellitus with other specified complication: Secondary | ICD-10-CM | POA: Diagnosis not present

## 2024-06-21 DIAGNOSIS — Z7984 Long term (current) use of oral hypoglycemic drugs: Secondary | ICD-10-CM | POA: Diagnosis not present

## 2024-06-21 LAB — COMPREHENSIVE METABOLIC PANEL WITH GFR
ALT: 15 U/L (ref 0–35)
AST: 18 U/L (ref 0–37)
Albumin: 4.4 g/dL (ref 3.5–5.2)
Alkaline Phosphatase: 102 U/L (ref 39–117)
BUN: 21 mg/dL (ref 6–23)
CO2: 31 meq/L (ref 19–32)
Calcium: 10.1 mg/dL (ref 8.4–10.5)
Chloride: 98 meq/L (ref 96–112)
Creatinine, Ser: 1.15 mg/dL (ref 0.40–1.20)
GFR: 47.84 mL/min — ABNORMAL LOW (ref >60.00)
Glucose, Bld: 130 mg/dL — ABNORMAL HIGH (ref 70–99)
Potassium: 4.3 meq/L (ref 3.5–5.1)
Sodium: 135 meq/L (ref 135–145)
Total Bilirubin: 0.4 mg/dL (ref 0.2–1.2)
Total Protein: 7.4 g/dL (ref 6.0–8.3)

## 2024-06-21 LAB — LIPID PANEL
Cholesterol: 147 mg/dL (ref 0–200)
HDL: 60.3 mg/dL (ref >39.00)
LDL Cholesterol: 78 mg/dL (ref 0–99)
NonHDL: 86.93
Total CHOL/HDL Ratio: 2
Triglycerides: 43 mg/dL (ref 0.0–149.0)
VLDL: 8.6 mg/dL (ref 0.0–40.0)

## 2024-06-21 LAB — HEMOGLOBIN A1C: Hgb A1c MFr Bld: 6.5 % (ref 4.6–6.5)

## 2024-06-21 MED ORDER — AMOXICILLIN-POT CLAVULANATE 875-125 MG PO TABS: 1.0000 | ORAL_TABLET | Freq: Two times a day (BID) | ORAL | 0 refills | Status: AC

## 2024-06-21 MED ORDER — OLMESARTAN MEDOXOMIL 5 MG PO TABS: 15.0000 mg | ORAL_TABLET | Freq: Two times a day (BID) | ORAL | 3 refills | Status: AC

## 2024-06-21 NOTE — Assessment & Plan Note (Signed)
-   Patient has a history of eustachian tube dysfunction for which she was referred to ENT for further evaluation -She also has a history of BPPV for which she did vestibular PT but states that this made her worse.  She did do home exercises and states that her vertigo had resolved -However, last week she began having left ear pain as well as a feeling of discharge in the left ear.  She also complained of recurrent vertigo with 1 episode of nausea and vomiting -Vertigo episodes usually last less than a minute and then resolved -On exam, unable to fully visualize left TM secondary to cerumen.  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

## 2024-06-21 NOTE — Assessment & Plan Note (Signed)
-   This problem is chronic and stable -Patient's last A1c was was well-controlled (less than 7) -We will continue with metformin  1000 mg twice a day -Her UACR was within normal limits earlier this year -Will recheck A1c and CMP today -No further workup at this time

## 2024-06-21 NOTE — Progress Notes (Signed)
 Acute Office Visit  Subjective:     Patient ID: Natasha Phillips, female    DOB: [birth year], 73 y.o.   MRN: [id number]  Chief Complaint  Patient presents with   Medical Management of Chronic Issues    Bilateral ear drainage and uncomfortable    Discussed the use of AI scribe software for clinical note transcription with the patient, who gave verbal consent to proceed.  History of Present Illness Natasha Phillips is a 73 year old female with benign paroxysmal positional vertigo who presents with worsening vertigo and ear symptoms.  Vertigo and associated symptoms - Significant vertigo worsened over the weekend, leading to vomiting - Vertigo improved after vomiting but persistent unsteadiness remains - Episodes are intermittent, lasting less than a minute but occasionally longer - Nausea associated with vertigo - Photophobia present, avoids driving at night due to light sensitivity - Cautious with head movements to avoid triggering vertigo  Otologic symptoms - Ear pain and echo began Friday night, now improved - Sensation of drainage from ear, especially when lying down, described as feeling like water in ear - Mild stiffness and echo in ear, associated with vertigo  History of vestibular therapy - Previously attended vestibular therapy but experienced worsening symptoms during sessions - Home vestibular exercises were beneficial for an extended period  Lower extremity edema - Occasional swelling in legs, none currently  Absence of other symptoms - No pain in arms - No cough, cold, fevers, chills, or abdominal pain  Antihypertensive and antidiabetic medication tolerance - Currently taking olmesartan  (three 5 mg tablets twice a day) and indapamide once a day - History of diabetes managed with metformin  - No current issues with medications - Initial side effects with olmesartan  resolved after dose adjustment    Review of Systems  Constitutional: Negative.   HENT:   Positive for ear discharge, ear pain and tinnitus. Negative for congestion, hearing loss and sinus pain.   Respiratory: Negative.    Cardiovascular: Negative.   Gastrointestinal: Negative.   Neurological:  Positive for dizziness.  Psychiatric/Behavioral: Negative.          Objective:    BP 122/78   Pulse 84   Temp 98.1 F (36.7 C)   Ht 5' 7 (1.702 m)   Wt 160 lb (72.6 kg)   SpO2 99%   BMI 25.06 kg/m    Physical Exam Constitutional:      Appearance: Normal appearance.  HENT:     Head: Normocephalic and atraumatic.     Right Ear: External ear normal.     Left Ear: External ear normal. There is impacted cerumen.     Ears:     Comments: Diminished light reflex from her right TM with possible fluid behind this ear.  Unable to visualize left TM secondary to wax in the left ear canal    Mouth/Throat:     Pharynx: Oropharynx is clear. No oropharyngeal exudate or posterior oropharyngeal erythema.  Cardiovascular:     Rate and Rhythm: Normal rate and regular rhythm.     Heart sounds: Normal heart sounds.  Pulmonary:     Effort: Pulmonary effort is normal.     Breath sounds: Normal breath sounds. No wheezing, rhonchi or rales.  Abdominal:     General: Bowel sounds are normal. There is no distension.     Palpations: Abdomen is soft.     Tenderness: There is no abdominal tenderness. There is no guarding or rebound.  Musculoskeletal:  General: No swelling or tenderness.     Cervical back: Neck supple.     Right lower leg: No edema.     Left lower leg: No edema.  Lymphadenopathy:     Cervical: No cervical adenopathy.  Neurological:     Mental Status: She is alert.  Psychiatric:        Mood and Affect: Mood normal.        Behavior: Behavior normal.     No results found for any visits on 06/21/24.      Assessment & Plan:   Problem List Items Addressed This Visit       Cardiovascular and Mediastinum   Hypertension associated with diabetes (HCC)   - This  problem is chronic and well-controlled -Patient is currently on olmesartan  50 mg twice daily as well as indapamide 1.25 mg daily -Blood pressure is currently being managed by nephrology -Will continue with current medication for now -No further workup at this time      Relevant Medications   olmesartan  (BENICAR ) 5 MG tablet     Endocrine   Hyperlipidemia associated with type 2 diabetes mellitus (HCC)   - This problem is chronic and stable -Patient is currently not on a statin (last LDL was in the 70s) -We will recheck lipid panel today -No further workup at this time      Relevant Medications   olmesartan  (BENICAR ) 5 MG tablet   Other Relevant Orders   Lipid panel   Type 2 diabetes mellitus with complications (HCC) - Primary   - This problem is chronic and stable -Patient's last A1c was was well-controlled (less than 7) -We will continue with metformin  1000 mg twice a day -Her UACR was within normal limits earlier this year -Will recheck A1c and CMP today -No further workup at this time      Relevant Medications   olmesartan  (BENICAR ) 5 MG tablet   Other Relevant Orders   Hemoglobin A1c   Comprehensive metabolic panel with GFR     Nervous and Auditory   Otitis media   - Patient has a history of eustachian tube dysfunction for which she was referred to ENT for further evaluation -She also has a history of BPPV for which she did vestibular PT but states that this made her worse.  She did do home exercises and states that her vertigo had resolved -However, last week she began having left ear pain as well as a feeling of discharge in the left ear.  She also complained of recurrent vertigo with 1 episode of nausea and vomiting -Vertigo episodes usually last less than a minute and then resolved -On exam, unable to fully visualize left TM secondary to cerumen.  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 but does not want referral to  vestibular therapy at this time -Given her ear pain, discharge as well as vestibular symptoms I suspect she likely has otitis media of her left ear -Will treat the patient with Augmentin  to complete a 7-day course -Patient states that she has a follow-up with ENT and will follow-up with them as well -No further workup at this time      Relevant Medications   amoxicillin -clavulanate (AUGMENTIN ) 875-125 MG tablet     Genitourinary   Hypertensive chronic kidney disease with stage 1 through stage 4 chronic kidney disease, or unspecified chronic kidney disease   Relevant Medications   olmesartan  (BENICAR ) 5 MG tablet   Stage  3a chronic kidney disease (HCC)   - This problems chronic and stable -Last GFR was greater than 60 -Patient does follow-up with nephrology as an outpatient -Will continue with ARB for now -Will recheck CMP today -No further workup at this time      Relevant Orders   Comprehensive metabolic panel with GFR    Meds ordered this encounter  Medications   amoxicillin -clavulanate (AUGMENTIN ) 875-125 MG tablet    Sig: Take 1 tablet by mouth 2 (two) times daily.    Dispense:  14 tablet    Refill:  0   olmesartan  (BENICAR ) 5 MG tablet    Sig: Take 3 tablets (15 mg total) by mouth 2 (two) times daily.    Dispense:  540 tablet    Refill:  3    Send refills to Dr Leavy at [facility]    No follow-ups on file.  Natasha Oconnor, MD

## 2024-06-21 NOTE — Telephone Encounter (Signed)
 Patient was seen in the office on the morning of 06/21/24.

## 2024-06-21 NOTE — Assessment & Plan Note (Signed)
-   This problem is chronic and stable -Patient is currently not on a statin (last LDL was in the 70s) -We will recheck lipid panel today -No further workup at this time

## 2024-06-21 NOTE — Telephone Encounter (Addendum)
 Pt states she already spoke to someone and they called in medication. No triage at this time.   Reason for Triage: Patient stated her ear is still draining -  Left ear is still causing draining and fluid build up.   Patient missed her appointment today due to transportation issues, she called in and stated that she will be going into the Clinic today to reschedule her missed appt.   As I stated I was not able to reschedule her appt at this time in the [facility] system, due to being [facility].   **Please Assist pt with her ear - she was looking to get a Rx for it if possible**  **Also Make sure her visit is rescheduled**

## 2024-06-21 NOTE — Assessment & Plan Note (Signed)
-   This problems chronic and stable -Last GFR was greater than 60 -Patient does follow-up with nephrology as an outpatient -Will continue with ARB for now -Will recheck CMP today -No further workup at this time

## 2024-06-21 NOTE — Patient Instructions (Signed)
°  VISIT SUMMARY: Today, we discussed your worsening vertigo and ear symptoms, as well as your ongoing management of diabetes, hypertension, and cholesterol levels. We reviewed your current medications and made some adjustments to address your symptoms and ensure your conditions remain well-controlled.  YOUR PLAN: -OTITIS MEDIA WITH BENIGN PAROXYSMAL POSITIONAL VERTIGO: You have an ear infection and a condition that causes brief episodes of dizziness, especially with head movements. We prescribed Augmentin  875 mg twice daily for 7 days to treat the infection. Please take it with food to avoid stomach upset. Follow up with an ENT specialist for further evaluation.  -TYPE 2 DIABETES MELLITUS WITH STAGE 3A CHRONIC KIDNEY DISEASE: Your diabetes is managed with metformin , and your previous A1c levels were well-controlled. We ordered an A1c test to check your current diabetes control and kidney function tests to evaluate your renal status.  -HYPERTENSION ASSOCIATED WITH DIABETES: Your blood pressure is well-controlled with your current medications, olmesartan  and indapamide. Continue taking these medications as prescribed.  -HYPERLIPIDEMIA ASSOCIATED WITH TYPE 2 DIABETES MELLITUS: Your cholesterol levels were previously well-controlled without medication. We ordered a cholesterol panel to reassess your lipid levels.  INSTRUCTIONS: Please follow up with an ENT specialist for further evaluation of your ear symptoms and vertigo. Additionally, complete the lab tests we ordered to check your diabetes control and kidney function. Continue taking your current medications as prescribed and take Augmentin  with food for the next 7 days.                      Contains text generated by [employer].                                 Contains text generated by [employer].

## 2024-06-21 NOTE — Assessment & Plan Note (Signed)
-   This problem is chronic and well-controlled -Patient is currently on olmesartan  50 mg twice daily as well as indapamide 1.25 mg daily -Blood pressure is currently being managed by nephrology -Will continue with current medication for now -No further workup at this time

## 2024-06-23 NOTE — Telephone Encounter (Unsigned)
 Copied from CRM #[id number]. Topic: Clinical - Lab/Test Results >> Jun 23, 2024 10:27 AM Ashley R wrote: Reason for CRM: Pt. Requests callback to discuss lab results and has questions about medications.    ----------------------------------------------------------------------- From previous Reason for Contact - Other: Reason for CRM:

## 2024-06-24 NOTE — Telephone Encounter (Unsigned)
 Copied from CRM #[id number]. Topic: Clinical - Lab/Test Results >> Jun 24, 2024  3:55 PM Victoria A wrote: Reason for CRM: Patient called Almarie back -Agent read message for patient to take Metformin  1000MG  one a day and informed patient to call us  back with any questions >> Jun 24, 2024  4:03 PM CMA Almarie T wrote: See result note. >> Jun 24, 2024  4:01 PM Franky GRADE wrote: Patient was returning a call received from [facility] regarding her Metformin  prescription, I apologized for the confusion and advised she should be taking one 1000 MG tablet a day and previously done.

## 2024-06-25 NOTE — Telephone Encounter (Unsigned)
 Copied from CRM #[id number]. Topic: Clinical - Medication Question >> Jun 25, 2024 10:07 AM Roselie BROCKS wrote: Reason for CRM: requests a script for a  cholesterol medication spoke about during visit. [id number] [place], [place] - [address] AT [facility] [address] [place] [address]

## 2024-06-25 NOTE — Telephone Encounter (Unsigned)
 Copied from CRM #[id number]. Topic: General - Other >> Jun 25, 2024  8:11 AM Laymon HERO wrote: Reason for CRM: Patient asking for Sebastian Almarie LABOR, CMA to return call- she had some questions on her recent visit on 12/15

## 2024-06-25 NOTE — Progress Notes (Signed)
 Left message for patient to give our office a call back to gather the questions she has following her appointment on 06/21/24.   OK for E2C2 to gather more information if patient calls back. If gathered, please notify the office.

## 2024-06-26 MED ORDER — ROSUVASTATIN CALCIUM 5 MG PO TABS: 5.0000 mg | ORAL_TABLET | Freq: Every day | ORAL | 2 refills | Status: AC

## 2024-06-26 NOTE — Telephone Encounter (Signed)
 FYI - Called Natasha Phillips and discussed her lab results and clarified metformin  dosage. Also discussed recommendation for cholesterol medication given history of diabetes. Agreeable to start low dose crestor . Crestor  5mg  q day sent in to pharmacy. It appears she has an establish care appt in 08/2024. Plan for f/u labs (f/u liver) during that appt.

## 2024-06-28 ENCOUNTER — Encounter

## 2024-08-25 ENCOUNTER — Encounter
